# Patient Record
Sex: Female | Born: 1955
Health system: Southern US, Community
[De-identification: ages and names within clinical notes are randomized; demographics above are authoritative.]

## PROBLEM LIST (undated history)

## (undated) DIAGNOSIS — T4145XA Adverse effect of unspecified anesthetic, initial encounter: Secondary | ICD-10-CM

## (undated) DIAGNOSIS — T8859XA Other complications of anesthesia, initial encounter: Secondary | ICD-10-CM

## (undated) DIAGNOSIS — N920 Excessive and frequent menstruation with regular cycle: Secondary | ICD-10-CM

## (undated) DIAGNOSIS — Z972 Presence of dental prosthetic device (complete) (partial): Secondary | ICD-10-CM

## (undated) DIAGNOSIS — T7840XA Allergy, unspecified, initial encounter: Secondary | ICD-10-CM

## (undated) DIAGNOSIS — E119 Type 2 diabetes mellitus without complications: Secondary | ICD-10-CM

## (undated) DIAGNOSIS — G629 Polyneuropathy, unspecified: Secondary | ICD-10-CM

## (undated) DIAGNOSIS — J209 Acute bronchitis, unspecified: Secondary | ICD-10-CM

## (undated) DIAGNOSIS — R197 Diarrhea, unspecified: Secondary | ICD-10-CM

## (undated) DIAGNOSIS — I10 Essential (primary) hypertension: Secondary | ICD-10-CM

## (undated) DIAGNOSIS — M1990 Unspecified osteoarthritis, unspecified site: Secondary | ICD-10-CM

## (undated) DIAGNOSIS — K209 Esophagitis, unspecified without bleeding: Secondary | ICD-10-CM

## (undated) DIAGNOSIS — IMO0001 Reserved for inherently not codable concepts without codable children: Secondary | ICD-10-CM

## (undated) DIAGNOSIS — Z794 Long term (current) use of insulin: Secondary | ICD-10-CM

## (undated) DIAGNOSIS — E785 Hyperlipidemia, unspecified: Secondary | ICD-10-CM

## (undated) DIAGNOSIS — K219 Gastro-esophageal reflux disease without esophagitis: Secondary | ICD-10-CM

## (undated) HISTORY — DX: Esophagitis, unspecified without bleeding: K20.90

## (undated) HISTORY — DX: Excessive and frequent menstruation with regular cycle: N92.0

## (undated) HISTORY — DX: Long term (current) use of insulin: Z79.4

## (undated) HISTORY — DX: Acute bronchitis, unspecified: J20.9

## (undated) HISTORY — DX: Diarrhea, unspecified: R19.7

## (undated) HISTORY — DX: Essential (primary) hypertension: I10

## (undated) HISTORY — DX: Hyperlipidemia, unspecified: E78.5

## (undated) HISTORY — PX: CHOLECYSTECTOMY: SHX55

## (undated) HISTORY — DX: Type 2 diabetes mellitus without complications: E11.9

## (undated) HISTORY — PX: WRIST SURGERY: SHX841

## (undated) HISTORY — DX: Esophagitis, unspecified: K20.9

## (undated) HISTORY — DX: Allergy, unspecified, initial encounter: T78.40XA

---

## 2008-10-08 ENCOUNTER — Emergency Department: Payer: Self-pay | Admitting: Emergency Medicine

## 2010-11-02 ENCOUNTER — Emergency Department: Payer: Self-pay | Admitting: *Deleted

## 2014-09-16 DIAGNOSIS — N951 Menopausal and female climacteric states: Secondary | ICD-10-CM | POA: Insufficient documentation

## 2014-09-16 DIAGNOSIS — E119 Type 2 diabetes mellitus without complications: Secondary | ICD-10-CM | POA: Insufficient documentation

## 2014-09-16 DIAGNOSIS — Z794 Long term (current) use of insulin: Secondary | ICD-10-CM | POA: Insufficient documentation

## 2014-09-16 DIAGNOSIS — E1122 Type 2 diabetes mellitus with diabetic chronic kidney disease: Secondary | ICD-10-CM

## 2014-09-16 DIAGNOSIS — K209 Esophagitis, unspecified without bleeding: Secondary | ICD-10-CM | POA: Insufficient documentation

## 2014-09-16 DIAGNOSIS — F17200 Nicotine dependence, unspecified, uncomplicated: Secondary | ICD-10-CM

## 2014-09-16 DIAGNOSIS — E1129 Type 2 diabetes mellitus with other diabetic kidney complication: Secondary | ICD-10-CM | POA: Insufficient documentation

## 2014-09-16 DIAGNOSIS — E785 Hyperlipidemia, unspecified: Secondary | ICD-10-CM | POA: Insufficient documentation

## 2014-09-16 DIAGNOSIS — N181 Chronic kidney disease, stage 1: Secondary | ICD-10-CM

## 2014-09-16 DIAGNOSIS — E1169 Type 2 diabetes mellitus with other specified complication: Secondary | ICD-10-CM | POA: Insufficient documentation

## 2014-09-16 DIAGNOSIS — E669 Obesity, unspecified: Secondary | ICD-10-CM | POA: Insufficient documentation

## 2014-09-16 DIAGNOSIS — I129 Hypertensive chronic kidney disease with stage 1 through stage 4 chronic kidney disease, or unspecified chronic kidney disease: Secondary | ICD-10-CM

## 2014-09-16 DIAGNOSIS — G4700 Insomnia, unspecified: Secondary | ICD-10-CM | POA: Insufficient documentation

## 2014-09-16 DIAGNOSIS — J309 Allergic rhinitis, unspecified: Secondary | ICD-10-CM | POA: Insufficient documentation

## 2014-09-19 ENCOUNTER — Other Ambulatory Visit: Payer: Self-pay

## 2014-09-19 MED ORDER — ATORVASTATIN CALCIUM 20 MG PO TABS: 20.0000 mg | ORAL_TABLET | Freq: Every day | ORAL | Status: DC

## 2014-09-19 NOTE — Telephone Encounter (Signed)
Patient was last seen on 05/05/14, practice partner number is [id number], and pharmacy is [facility].

## 2014-09-23 ENCOUNTER — Ambulatory Visit (INDEPENDENT_AMBULATORY_CARE_PROVIDER_SITE_OTHER): Payer: Commercial Managed Care - PPO | Admitting: Unknown Physician Specialty

## 2014-09-23 ENCOUNTER — Encounter: Payer: Self-pay | Admitting: Unknown Physician Specialty

## 2014-09-23 ENCOUNTER — Other Ambulatory Visit: Payer: Self-pay | Admitting: Unknown Physician Specialty

## 2014-09-23 VITALS — BP 124/79 | HR 70 | Temp 97.6°F | Ht 64.5 in | Wt 219.0 lb

## 2014-09-23 DIAGNOSIS — N185 Chronic kidney disease, stage 5: Secondary | ICD-10-CM | POA: Diagnosis not present

## 2014-09-23 DIAGNOSIS — I129 Hypertensive chronic kidney disease with stage 1 through stage 4 chronic kidney disease, or unspecified chronic kidney disease: Secondary | ICD-10-CM

## 2014-09-23 DIAGNOSIS — E1122 Type 2 diabetes mellitus with diabetic chronic kidney disease: Secondary | ICD-10-CM | POA: Diagnosis not present

## 2014-09-23 DIAGNOSIS — N183 Chronic kidney disease, stage 3 (moderate): Secondary | ICD-10-CM | POA: Diagnosis not present

## 2014-09-23 DIAGNOSIS — N181 Chronic kidney disease, stage 1: Secondary | ICD-10-CM | POA: Diagnosis not present

## 2014-09-23 DIAGNOSIS — N184 Chronic kidney disease, stage 4 (severe): Secondary | ICD-10-CM | POA: Diagnosis not present

## 2014-09-23 DIAGNOSIS — M542 Cervicalgia: Secondary | ICD-10-CM

## 2014-09-23 DIAGNOSIS — N189 Chronic kidney disease, unspecified: Secondary | ICD-10-CM

## 2014-09-23 DIAGNOSIS — N182 Chronic kidney disease, stage 2 (mild): Secondary | ICD-10-CM | POA: Diagnosis not present

## 2014-09-23 LAB — BAYER DCA HB A1C WAIVED: HB A1C: 7.2 % — AB (ref <7.0)

## 2014-09-23 MED ORDER — INSULIN DETEMIR 100 UNIT/ML FLEXPEN: 60.0000 [IU] | PEN_INJECTOR | Freq: Every day | SUBCUTANEOUS | Status: DC

## 2014-09-23 NOTE — Addendum Note (Signed)
Addended by: Kathrine Haddock on: 09/23/2014 12:05 PM   Modules accepted: Orders, Medications

## 2014-09-23 NOTE — Assessment & Plan Note (Signed)
Hgb A1C is 7.2 and down from 7.8.  She has lost 22 pounds and planning on continuing.

## 2014-09-23 NOTE — Progress Notes (Signed)
BP 124/79 mmHg  Pulse 70  Temp(Src) 97.6 F (36.4 C)  Ht 5' 4.5" (1.638 m)  Wt 219 lb (99.338 kg)  BMI 37.02 kg/m2  SpO2 97%  LMP  (LMP Unknown)   Subjective:    [name], female    DOB: 05/12/1955, 59 y.o.   MRN: [id number]  HPI: April Johnson is a 59 y.o. female  Chief Complaint  Patient presents with  . Diabetes  . Hypertension  . Hyperlipidemia   1.  DIABETES Hypoglycemic episodes:  no  H8  Polydipsia/polyuria:  no  H8 Visual disturbance:  no  R2  Chest pain:  no  R4  Paresthesias:  no  R10  Glucose Monitoring:  yes   Accucheck frequency:  once daily  Last Hgb A1C is 7.8 Fasting glucose:  145 this AM Evening:  170-200 Long acting insulin: 60  units  qHS  Blood Pressure Monitoring:  not checking.  H3 Retinal Examination:  not up to date. Will schedule  P1 Foot Exam: Foot Exam Done on 01/07/14 P1 Diabetic Education:  completed   P1 Aspirin:  Aspirin Therapy Not applicable on 73/53/29  Back pain Pt with back pain that catches "that I can hardly move."  States that the pain radiates down her legs.  She is having neck stiffness and pain "I wonder if my bone is out of line."  No reall alleviating or aggravating factors.     Relevant past medical, surgical, family and social history reviewed and updated as indicated. Interim medical history since our last visit reviewed. Allergies and medications reviewed and updated.  Review of Systems  Per HPI unless specifically indicated above     Objective:    BP 124/79 mmHg  Pulse 70  Temp(Src) 97.6 F (36.4 C)  Ht 5' 4.5" (1.638 m)  Wt 219 lb (99.338 kg)  BMI 37.02 kg/m2  SpO2 97%  LMP  (LMP Unknown)  Wt Readings from Last 3 Encounters:  09/23/14 219 lb (99.338 kg)  05/05/14 223 lb (101.152 kg)    Physical Exam  Constitutional: She is oriented to person, place, and time. She appears well-developed and well-nourished. No distress.  HENT:  Head: Normocephalic and atraumatic.   Eyes: Conjunctivae and lids are normal. Right eye exhibits no discharge. Left eye exhibits no discharge. No scleral icterus.  Cardiovascular: Normal rate and regular rhythm.   Pulmonary/Chest: Effort normal and breath sounds normal. No respiratory distress.  Abdominal: Normal appearance. There is no splenomegaly or hepatomegaly.  Musculoskeletal: Normal range of motion.  Neurological: She is alert and oriented to person, place, and time.  Skin: Skin is intact. No rash noted. No pallor.  Psychiatric: She has a normal mood and affect. Her behavior is normal. Judgment and thought content normal.    No results found for this or any previous visit.    Assessment & Plan:   Problem List Items Addressed This Visit      Endocrine   Diabetes mellitus with renal manifestation    Hgb A1C is 7.2 and down from 7.8.  She has lost 22 pounds and planning on continuing.          Genitourinary   Hypertensive chronic kidney disease    Other Visit Diagnoses    Neck pain    -  Primary    Discussed PT vs chiropractor.  She will go to a chiropractor        Follow up plan: Return in about 3 months (around  12/24/2014).   Will need Hgb A1C, CMP, Uric Acid, Microalbumin.

## 2014-09-24 LAB — COMPREHENSIVE METABOLIC PANEL
A/G RATIO: 2.1 (ref 1.1–2.5)
ALBUMIN: 4.4 g/dL (ref 3.5–5.5)
ALT: 15 IU/L (ref 0–32)
AST: 16 IU/L (ref 0–40)
Alkaline Phosphatase: 81 IU/L (ref 39–117)
BUN/Creatinine Ratio: 21 (ref 9–23)
BUN: 17 mg/dL (ref 6–24)
Bilirubin Total: 0.5 mg/dL (ref 0.0–1.2)
CALCIUM: 9.8 mg/dL (ref 8.7–10.2)
CO2: 25 mmol/L (ref 18–29)
Chloride: 100 mmol/L (ref 97–108)
Creatinine, Ser: 0.8 mg/dL (ref 0.57–1.00)
GFR calc Af Amer: 95 mL/min/{1.73_m2} (ref >59)
GFR, EST NON AFRICAN AMERICAN: 82 mL/min/{1.73_m2} (ref >59)
GLUCOSE: 116 mg/dL — AB (ref 65–99)
Globulin, Total: 2.1 g/dL (ref 1.5–4.5)
Potassium: 4.4 mmol/L (ref 3.5–5.2)
SODIUM: 142 mmol/L (ref 134–144)
Total Protein: 6.5 g/dL (ref 6.0–8.5)

## 2014-09-29 ENCOUNTER — Other Ambulatory Visit: Payer: Self-pay

## 2014-09-29 MED ORDER — TELMISARTAN-HCTZ 80-25 MG PO TABS: 1.0000 | ORAL_TABLET | Freq: Every day | ORAL | Status: DC

## 2014-09-29 NOTE — Telephone Encounter (Signed)
Routing to provider. Patient was last seen on 09/23/14 and [facility] [place].

## 2014-10-08 ENCOUNTER — Other Ambulatory Visit: Payer: Self-pay

## 2014-10-08 MED ORDER — AMLODIPINE BESYLATE 5 MG PO TABS: 5.0000 mg | ORAL_TABLET | Freq: Every day | ORAL | Status: DC

## 2014-10-08 MED ORDER — PIOGLITAZONE HCL-METFORMIN HCL 15-500 MG PO TABS: 1.0000 | ORAL_TABLET | Freq: Two times a day (BID) | ORAL | Status: DC

## 2014-10-08 NOTE — Telephone Encounter (Signed)
Patient was last seen on 09/23/14 and [facility] [place].

## 2014-11-03 ENCOUNTER — Other Ambulatory Visit: Payer: Self-pay

## 2014-11-03 MED ORDER — CANAGLIFLOZIN 300 MG PO TABS: 300.0000 mg | ORAL_TABLET | Freq: Every day | ORAL | Status: DC

## 2014-11-03 NOTE — Telephone Encounter (Signed)
Patient was last seen on 09/23/14 and [facility] [place].  

## 2014-11-19 ENCOUNTER — Other Ambulatory Visit: Payer: Self-pay | Admitting: Unknown Physician Specialty

## 2014-12-05 ENCOUNTER — Other Ambulatory Visit: Payer: Self-pay | Admitting: Unknown Physician Specialty

## 2014-12-17 ENCOUNTER — Ambulatory Visit: Payer: Commercial Managed Care - PPO | Admitting: Unknown Physician Specialty

## 2014-12-19 ENCOUNTER — Ambulatory Visit: Payer: Commercial Managed Care - PPO | Admitting: Unknown Physician Specialty

## 2014-12-22 ENCOUNTER — Other Ambulatory Visit: Payer: Self-pay

## 2014-12-22 MED ORDER — METOPROLOL SUCCINATE ER 100 MG PO TB24: 100.0000 mg | ORAL_TABLET | Freq: Every day | ORAL | Status: DC

## 2014-12-22 NOTE — Telephone Encounter (Signed)
PATIENT: April Johnson DOB: Oct 27, 1955 LAST VISIT: 09/20/2014  Patient requests metoprolol er 100 mg tab.

## 2014-12-24 ENCOUNTER — Ambulatory Visit: Payer: Commercial Managed Care - PPO | Admitting: Unknown Physician Specialty

## 2014-12-24 ENCOUNTER — Ambulatory Visit (INDEPENDENT_AMBULATORY_CARE_PROVIDER_SITE_OTHER): Payer: Commercial Managed Care - PPO | Admitting: Unknown Physician Specialty

## 2014-12-24 ENCOUNTER — Encounter: Payer: Self-pay | Admitting: Unknown Physician Specialty

## 2014-12-24 ENCOUNTER — Other Ambulatory Visit: Payer: Self-pay

## 2014-12-24 VITALS — BP 129/77 | HR 64 | Temp 98.2°F | Ht 64.2 in | Wt 215.2 lb

## 2014-12-24 DIAGNOSIS — E1122 Type 2 diabetes mellitus with diabetic chronic kidney disease: Secondary | ICD-10-CM | POA: Diagnosis not present

## 2014-12-24 DIAGNOSIS — I129 Hypertensive chronic kidney disease with stage 1 through stage 4 chronic kidney disease, or unspecified chronic kidney disease: Secondary | ICD-10-CM

## 2014-12-24 DIAGNOSIS — N182 Chronic kidney disease, stage 2 (mild): Secondary | ICD-10-CM

## 2014-12-24 DIAGNOSIS — Z0000 Encounter for general adult medical examination without abnormal findings: Secondary | ICD-10-CM

## 2014-12-24 DIAGNOSIS — Z794 Long term (current) use of insulin: Secondary | ICD-10-CM | POA: Diagnosis not present

## 2014-12-24 DIAGNOSIS — G4700 Insomnia, unspecified: Secondary | ICD-10-CM | POA: Diagnosis not present

## 2014-12-24 DIAGNOSIS — Z23 Encounter for immunization: Secondary | ICD-10-CM

## 2014-12-24 DIAGNOSIS — E785 Hyperlipidemia, unspecified: Secondary | ICD-10-CM | POA: Diagnosis not present

## 2014-12-24 LAB — LIPID PANEL PICCOLO, WAIVED
CHOL/HDL RATIO PICCOLO,WAIVE: 3.1 mg/dL
CHOLESTEROL PICCOLO, WAIVED: 163 mg/dL (ref <200)
HDL Chol Piccolo, Waived: 52 mg/dL — ABNORMAL LOW (ref >59)
LDL Chol Calc Piccolo Waived: 43 mg/dL (ref <100)
TRIGLYCERIDES PICCOLO,WAIVED: 342 mg/dL — AB (ref <150)
VLDL Chol Calc Piccolo,Waive: 68 mg/dL — ABNORMAL HIGH (ref <30)

## 2014-12-24 LAB — MICROALBUMIN, URINE WAIVED
CREATININE, URINE WAIVED: 50 mg/dL (ref 10–300)
Microalb, Ur Waived: 10 mg/L (ref 0–19)

## 2014-12-24 LAB — BAYER DCA HB A1C WAIVED: HB A1C: 7.2 % — AB (ref <7.0)

## 2014-12-24 MED ORDER — CANAGLIFLOZIN 300 MG PO TABS: 300.0000 mg | ORAL_TABLET | Freq: Every day | ORAL | Status: DC

## 2014-12-24 NOTE — Patient Instructions (Signed)
Insomnia Insomnia is a sleep disorder that makes it difficult to fall asleep or to stay asleep. Insomnia can cause tiredness (fatigue), low energy, difficulty concentrating, mood swings, and poor performance at work or school.  There are three different ways to classify insomnia:  Difficulty falling asleep.  Difficulty staying asleep.  Waking up too early in the morning. Any type of insomnia can be long-term (chronic) or short-term (acute). Both are common. Short-term insomnia usually lasts for three months or less. Chronic insomnia occurs at least three times a week for longer than three months. CAUSES  Insomnia may be caused by another condition, situation, or substance, such as:  Anxiety.  Certain medicines.  Gastroesophageal reflux disease (GERD) or other gastrointestinal conditions.  Asthma or other breathing conditions.  Restless legs syndrome, sleep apnea, or other sleep disorders.  Chronic pain.  Menopause. This may include hot flashes.  Stroke.  Abuse of alcohol, tobacco, or illegal drugs.  Depression.  Caffeine.   Neurological disorders, such as Alzheimer disease.  An overactive thyroid (hyperthyroidism). The cause of insomnia may not be known. RISK FACTORS Risk factors for insomnia include:  Gender. Women are more commonly affected than men.  Age. Insomnia is more common as you get older.  Stress. This may involve your professional or personal life.  Income. Insomnia is more common in people with lower income.  Lack of exercise.   Irregular work schedule or night shifts.  Traveling between different time zones. SIGNS AND SYMPTOMS If you have insomnia, trouble falling asleep or trouble staying asleep is the main symptom. This may lead to other symptoms, such as:  Feeling fatigued.  Feeling nervous about going to sleep.  Not feeling rested in the morning.  Having trouble concentrating.  Feeling irritable, anxious, or depressed. TREATMENT   Treatment for insomnia depends on the cause. If your insomnia is caused by an underlying condition, treatment will focus on addressing the condition. Treatment may also include:   Medicines to help you sleep.  Counseling or therapy.  Lifestyle adjustments. HOME CARE INSTRUCTIONS   Take medicines only as directed by your health care provider.  Keep regular sleeping and waking hours. Avoid naps.  Keep a sleep diary to help you and your health care provider figure out what could be causing your insomnia. Include:   When you sleep.  When you wake up during the night.  How well you sleep.   How rested you feel the next day.  Any side effects of medicines you are taking.  What you eat and drink.   Make your bedroom a comfortable place where it is easy to fall asleep:  Put up shades or special blackout curtains to block light from outside.  Use a white noise machine to block noise.  Keep the temperature cool.   Exercise regularly as directed by your health care provider. Avoid exercising right before bedtime.  Use relaxation techniques to manage stress. Ask your health care provider to suggest some techniques that may work well for you. These may include:  Breathing exercises.  Routines to release muscle tension.  Visualizing peaceful scenes.  Cut back on alcohol, caffeinated beverages, and cigarettes, especially close to bedtime. These can disrupt your sleep.  Do not overeat or eat spicy foods right before bedtime. This can lead to digestive discomfort that can make it hard for you to sleep.  Limit screen use before bedtime. This includes:  Watching TV.  Using your smartphone, tablet, and computer.  Stick to a routine. This   can help you fall asleep faster. Try to do a quiet activity, brush your teeth, and go to bed at the same time each night.  Get out of bed if you are still awake after 15 minutes of trying to sleep. Keep the lights down, but try reading or  doing a quiet activity. When you feel sleepy, go back to bed.  Make sure that you drive carefully. Avoid driving if you feel very sleepy.  Keep all follow-up appointments as directed by your health care provider. This is important. SEEK MEDICAL CARE IF:   You are tired throughout the day or have trouble in your daily routine due to sleepiness.  You continue to have sleep problems or your sleep problems get worse. SEEK IMMEDIATE MEDICAL CARE IF:   You have serious thoughts about hurting yourself or someone else.   This information is not intended to replace advice given to you by your health care provider. Make sure you discuss any questions you have with your health care provider.   Document Released: 02/19/2000 Document Revised: 11/12/2014 Document Reviewed: 11/22/2013 Elsevier Interactive Patient Education 2016 [employer] Insomnia Insomnia is a sleep disorder that makes it difficult to fall asleep or to stay asleep. Insomnia can cause tiredness (fatigue), low energy, difficulty concentrating, mood swings, and poor performance at work or school.  There are three different ways to classify insomnia:  Difficulty falling asleep.  Difficulty staying asleep.  Waking up too early in the morning. Any type of insomnia can be long-term (chronic) or short-term (acute). Both are common. Short-term insomnia usually lasts for three months or less. Chronic insomnia occurs at least three times a week for longer than three months. CAUSES  Insomnia may be caused by another condition, situation, or substance, such as:  Anxiety.  Certain medicines.  Gastroesophageal reflux disease (GERD) or other gastrointestinal conditions.  Asthma or other breathing conditions.  Restless legs syndrome, sleep apnea, or other sleep disorders.  Chronic pain.  Menopause. This may include hot flashes.  Stroke.  Abuse of alcohol, tobacco, or illegal drugs.  Depression.  Caffeine.   Neurological  disorders, such as Alzheimer disease.  An overactive thyroid (hyperthyroidism). The cause of insomnia may not be known. RISK FACTORS Risk factors for insomnia include:  Gender. Women are more commonly affected than men.  Age. Insomnia is more common as you get older.  Stress. This may involve your professional or personal life.  Income. Insomnia is more common in people with lower income.  Lack of exercise.   Irregular work schedule or night shifts.  Traveling between different time zones. SIGNS AND SYMPTOMS If you have insomnia, trouble falling asleep or trouble staying asleep is the main symptom. This may lead to other symptoms, such as:  Feeling fatigued.  Feeling nervous about going to sleep.  Not feeling rested in the morning.  Having trouble concentrating.  Feeling irritable, anxious, or depressed. TREATMENT  Treatment for insomnia depends on the cause. If your insomnia is caused by an underlying condition, treatment will focus on addressing the condition. Treatment may also include:   Medicines to help you sleep.  Counseling or therapy.  Lifestyle adjustments. HOME CARE INSTRUCTIONS   Take medicines only as directed by your health care provider.  Keep regular sleeping and waking hours. Avoid naps.  Keep a sleep diary to help you and your health care provider figure out what could be causing your insomnia. Include:   When you sleep.  When you wake up during the night.  How well you sleep.   How rested you feel the next day.  Any side effects of medicines you are taking.  What you eat and drink.   Make your bedroom a comfortable place where it is easy to fall asleep:  Put up shades or special blackout curtains to block light from outside.  Use a white noise machine to block noise.  Keep the temperature cool.   Exercise regularly as directed by your health care provider. Avoid exercising right before bedtime.  Use relaxation techniques to  manage stress. Ask your health care provider to suggest some techniques that may work well for you. These may include:  Breathing exercises.  Routines to release muscle tension.  Visualizing peaceful scenes.  Cut back on alcohol, caffeinated beverages, and cigarettes, especially close to bedtime. These can disrupt your sleep.  Do not overeat or eat spicy foods right before bedtime. This can lead to digestive discomfort that can make it hard for you to sleep.  Limit screen use before bedtime. This includes:  Watching TV.  Using your smartphone, tablet, and computer.  Stick to a routine. This can help you fall asleep faster. Try to do a quiet activity, brush your teeth, and go to bed at the same time each night.  Get out of bed if you are still awake after 15 minutes of trying to sleep. Keep the lights down, but try reading or doing a quiet activity. When you feel sleepy, go back to bed.  Make sure that you drive carefully. Avoid driving if you feel very sleepy.  Keep all follow-up appointments as directed by your health care provider. This is important. SEEK MEDICAL CARE IF:   You are tired throughout the day or have trouble in your daily routine due to sleepiness.  You continue to have sleep problems or your sleep problems get worse. SEEK IMMEDIATE MEDICAL CARE IF:   You have serious thoughts about hurting yourself or someone else.   This information is not intended to replace advice given to you by your health care provider. Make sure you discuss any questions you have with your health care provider.   Document Released: 02/19/2000 Document Revised: 11/12/2014 Document Reviewed: 11/22/2013 Elsevier Interactive Patient Education [employer].

## 2014-12-24 NOTE — Assessment & Plan Note (Addendum)
HgB A1C reviewed results 7.2 she has lost 35 lbs and plans to continue improving diet and continue exercising Uric acid ordered results pending Stable, continue present medications.

## 2014-12-24 NOTE — Assessment & Plan Note (Addendum)
Lipid panel results reviewed LDL 43 Total Cholesterol 163 results stable

## 2014-12-24 NOTE — Progress Notes (Signed)
BP 129/77 mmHg  Pulse 64  Temp(Src) 98.2 F (36.8 C)  Ht 5' 4.2" (1.631 m)  Wt 215 lb 3.2 oz (97.614 kg)  BMI 36.69 kg/m2  SpO2 98%  LMP  (LMP Unknown)   Subjective:    [name], female    DOB: 1955/08/01, 58 y.o.   MRN: [id number]  HPI: April Johnson is a 59 y.o. female  Chief Complaint  Patient presents with  . Diabetes  . Hyperlipidemia  . Hypertension   Diabetes This is a chronic problem medication compliance is excellent.   She is satisfied with the current treatment.  She checks blood sugars 1-2 times a week fasting blood sugars 105 to 130 postprandial blood sugars 220.  She has been limiting carbohydrate intake she has lost 35 lbs and feels great.  Intermittent numbness and tingling at night she thinks it may be positional.  Last Hgb A1C was 7.2.  Pertinent negatives denies hypoglycemic events, polydipsia/polyuria, visual disturbances, chest pain, or shortness of breath  Hypertension/Hyperlipidemia This is a chronic problem medication compliance is excellent.  She is satisfied with the current treatment.  Pertinent negatives denies chest pain, palpitations, edema, shortness of breath, or visual disturbances   Insomnia She has problems with falling and staying asleep she goes to bed at 08:30 pm she wakes up at 05:30 am.  On average she sleeps 5 hrs at night she wakes up at 01:00 am, and it takes 1 hour for her to fall back to sleep.  Pertinent negatives denies agitation, confusion, decreased concentration, or nervousness or anxiety  Relevant past medical, surgical, family and social history reviewed and updated as indicated. Interim medical history since our last visit reviewed. Allergies and medications reviewed and updated.  Review of Systems  Constitutional: Negative.   HENT: Negative.   Respiratory: Negative.   Cardiovascular: Negative.   Gastrointestinal: Positive for nausea and diarrhea. Negative for vomiting, abdominal pain,  constipation, blood in stool, abdominal distention and rectal pain.  Endocrine: Negative.   Genitourinary: Negative.   Musculoskeletal: Negative.   Skin: Negative.   Allergic/Immunologic: Negative.   Neurological: Positive for dizziness. Negative for tremors, seizures, syncope, facial asymmetry, speech difficulty, light-headedness, numbness and headaches.  Psychiatric/Behavioral: Negative.        Depressed at times due to husband working out of town a lot    Per HPI unless specifically indicated above     Objective:    BP 129/77 mmHg  Pulse 64  Temp(Src) 98.2 F (36.8 C)  Ht 5' 4.2" (1.631 m)  Wt 215 lb 3.2 oz (97.614 kg)  BMI 36.69 kg/m2  SpO2 98%  LMP  (LMP Unknown)  Wt Readings from Last 3 Encounters:  12/24/14 215 lb 3.2 oz (97.614 kg)  09/23/14 219 lb (99.338 kg)  05/05/14 223 lb (101.152 kg)    Physical Exam  Constitutional: She is oriented to person, place, and time. She appears well-developed and well-nourished. No distress.  HENT:  Head: Normocephalic and atraumatic.  Right Ear: External ear normal.  Left Ear: External ear normal.  Nose: Nose normal.  Neck: Normal range of motion. Neck supple.  Cardiovascular: Normal rate, regular rhythm, normal heart sounds and intact distal pulses.   Pulmonary/Chest: Effort normal and breath sounds normal. No respiratory distress. She has no wheezes. She has no rales. She exhibits no tenderness.  Musculoskeletal: Normal range of motion. She exhibits no edema or tenderness.  Neurological: She is alert and oriented to person, place, and time.  Skin: Skin is warm and dry. No rash noted. She is not diaphoretic. No erythema. No pallor.  Psychiatric: She has a normal mood and affect. Her behavior is normal. Judgment and thought content normal.        Assessment & Plan:   Problem List Items Addressed This Visit      Unprioritized   Diabetes mellitus with renal manifestation ([place])    HgB A1C reviewed results 7.2 she has lost  35 lbs and plans to continue improving diet and continue exercising Uric acid ordered results pending Stable, continue present medications.        Relevant Orders   Bayer DCA Hb A1c Waived   Microalbumin, Urine Waived   Hyperlipidemia    Lipid panel results reviewed LDL 43 Total Cholesterol 163 results stable      Relevant Orders   Lipid Panel Piccolo, Waived   Hypertensive chronic kidney disease    Comprehensive metabolic panel ordered results pending Stable, continue present medications.        Relevant Orders   Microalbumin, Urine Waived   Uric acid   Comprehensive metabolic panel   Insomnia    Instructed pt during times she is awake during the night she can read a book or magazine until she falls asleep Sleep hygiene education handout given to pt Instructed pt to not lay in bed when she is awake      Routine health maintenance   Relevant Orders   Ambulatory referral to Gastroenterology   MM DIGITAL SCREENING BILATERAL    Other Visit Diagnoses    Immunization due    -  Primary    Relevant Orders    Flu Vaccine QUAD 36+ mos PF IM (Fluarix & Fluzone Quad PF) (Completed)        Follow up plan: Return in about 3 months (around 03/26/2015) for CPE.

## 2014-12-24 NOTE — Assessment & Plan Note (Signed)
Comprehensive metabolic panel ordered results pending Stable, continue present medications.

## 2014-12-24 NOTE — Assessment & Plan Note (Addendum)
Instructed pt during times she is awake during the night she can read a book or magazine until she falls asleep Sleep hygiene education handout given to pt Instructed pt to not lay in bed when she is awake

## 2014-12-24 NOTE — Telephone Encounter (Signed)
Patient was just seen this morning and [facility] [place].

## 2014-12-25 LAB — COMPREHENSIVE METABOLIC PANEL
A/G RATIO: 2 (ref 1.1–2.5)
ALT: 17 IU/L (ref 0–32)
AST: 19 IU/L (ref 0–40)
Albumin: 4.5 g/dL (ref 3.5–5.5)
Alkaline Phosphatase: 84 IU/L (ref 39–117)
BILIRUBIN TOTAL: 0.4 mg/dL (ref 0.0–1.2)
BUN/Creatinine Ratio: 18 (ref 9–23)
BUN: 14 mg/dL (ref 6–24)
CALCIUM: 10 mg/dL (ref 8.7–10.2)
CHLORIDE: 98 mmol/L (ref 97–106)
CO2: 28 mmol/L (ref 18–29)
Creatinine, Ser: 0.78 mg/dL (ref 0.57–1.00)
GFR, EST AFRICAN AMERICAN: 97 mL/min/{1.73_m2} (ref >59)
GFR, EST NON AFRICAN AMERICAN: 84 mL/min/{1.73_m2} (ref >59)
GLOBULIN, TOTAL: 2.2 g/dL (ref 1.5–4.5)
Glucose: 95 mg/dL (ref 65–99)
POTASSIUM: 4.7 mmol/L (ref 3.5–5.2)
SODIUM: 139 mmol/L (ref 136–144)
TOTAL PROTEIN: 6.7 g/dL (ref 6.0–8.5)

## 2014-12-25 LAB — URIC ACID: Uric Acid: 5.4 mg/dL (ref 2.5–7.1)

## 2015-01-02 ENCOUNTER — Other Ambulatory Visit: Payer: Self-pay

## 2015-01-02 ENCOUNTER — Telehealth: Payer: Self-pay

## 2015-01-02 NOTE — Telephone Encounter (Signed)
Gastroenterology Pre-Procedure Review  Request Date: 01/12/15 Requesting Physician: Kathrine Haddock, NP  PATIENT REVIEW QUESTIONS: The patient responded to the following health history questions as indicated:    1. Are you having any GI issues? yes (Rectal bleeding, soreness) 2. Do you have a personal history of Polyps? no 3. Do you have a family history of Colon Cancer or Polyps? no 4. Diabetes Mellitus? yes (Type 2) 5. Joint replacements in the past 12 months?no 6. Major health problems in the past 3 months?no 7. Any artificial heart valves, MVP, or defibrillator?no    MEDICATIONS & ALLERGIES:    Patient reports the following regarding taking any anticoagulation/antiplatelet therapy:   Plavix, Coumadin, Eliquis, Xarelto, Lovenox, Pradaxa, Brilinta, or Effient? no Aspirin? yes (ASA 6m)  Patient confirms/reports the following medications:  Current Outpatient Prescriptions  Medication Sig Dispense Refill  . amLODipine (NORVASC) 5 MG tablet Take 1 tablet (5 mg total) by mouth daily. 90 tablet 1  . aspirin 81 MG tablet Take 81 mg by mouth daily.    .Marland Kitchenatorvastatin (LIPITOR) 20 MG tablet Take 1 tablet (20 mg total) by mouth daily. 30 tablet 3  . canagliflozin (INVOKANA) 300 MG TABS tablet Take 300 mg by mouth daily. 30 tablet 0  . fluconazole (DIFLUCAN) 150 MG tablet 1 TABLET NOW ORAL FOR 1 DAYS 1 tablet 4  . glucose blood test strip 1 each by Other route as needed for other. Use as instructed    . Insulin Detemir (LEVEMIR FLEXTOUCH) 100 UNIT/ML Pen Inject 60 Units into the skin at bedtime. 20 mL 12  . metoprolol succinate (TOPROL-XL) 100 MG 24 hr tablet Take 1 tablet (100 mg total) by mouth daily. Take with or immediately following a meal. 90 tablet 1  . omeprazole (PRILOSEC) 20 MG capsule Take 20 mg by mouth daily.    . pioglitazone-metformin (ACTOPLUS MET) 15-500 MG per tablet Take 1 tablet by mouth 2 (two) times daily with a meal. 180 tablet 3  . telmisartan-hydrochlorothiazide  (MICARDIS HCT) 80-25 MG per tablet Take 1 tablet by mouth daily. 90 tablet 3   No current facility-administered medications for this visit.    Patient confirms/reports the following allergies:  Allergies  Allergen Reactions  . Niacin And Related     No orders of the defined types were placed in this encounter.    AUTHORIZATION INFORMATION Primary Insurance: 1D#: Group #:  Secondary Insurance: 1D#: Group #:  SCHEDULE INFORMATION: Date: 01/12/15 Time: [place]

## 2015-01-09 NOTE — Discharge Instructions (Signed)

## 2015-01-12 ENCOUNTER — Ambulatory Visit: Payer: Commercial Managed Care - PPO | Admitting: Anesthesiology

## 2015-01-12 ENCOUNTER — Encounter
Admission: RE | Disposition: A | Discharge status: Home / Self Care | Payer: Self-pay | Source: Ambulatory Visit | Attending: Gastroenterology

## 2015-01-12 ENCOUNTER — Other Ambulatory Visit: Payer: Self-pay | Admitting: Gastroenterology

## 2015-01-12 ENCOUNTER — Ambulatory Visit
Admission: RE | Admit: 2015-01-12 | Discharge: 2015-01-12 | Disposition: A | Discharge status: Home / Self Care | Payer: Commercial Managed Care - PPO | Source: Ambulatory Visit | Attending: Gastroenterology | Admitting: Gastroenterology

## 2015-01-12 DIAGNOSIS — E785 Hyperlipidemia, unspecified: Secondary | ICD-10-CM | POA: Diagnosis not present

## 2015-01-12 DIAGNOSIS — K219 Gastro-esophageal reflux disease without esophagitis: Secondary | ICD-10-CM | POA: Diagnosis not present

## 2015-01-12 DIAGNOSIS — Z87891 Personal history of nicotine dependence: Secondary | ICD-10-CM | POA: Diagnosis not present

## 2015-01-12 DIAGNOSIS — K635 Polyp of colon: Secondary | ICD-10-CM | POA: Diagnosis not present

## 2015-01-12 DIAGNOSIS — K5730 Diverticulosis of large intestine without perforation or abscess without bleeding: Secondary | ICD-10-CM | POA: Diagnosis not present

## 2015-01-12 DIAGNOSIS — Z7982 Long term (current) use of aspirin: Secondary | ICD-10-CM | POA: Insufficient documentation

## 2015-01-12 DIAGNOSIS — E1140 Type 2 diabetes mellitus with diabetic neuropathy, unspecified: Secondary | ICD-10-CM | POA: Diagnosis not present

## 2015-01-12 DIAGNOSIS — D120 Benign neoplasm of cecum: Secondary | ICD-10-CM | POA: Diagnosis not present

## 2015-01-12 DIAGNOSIS — K641 Second degree hemorrhoids: Secondary | ICD-10-CM | POA: Diagnosis not present

## 2015-01-12 DIAGNOSIS — Z888 Allergy status to other drugs, medicaments and biological substances status: Secondary | ICD-10-CM | POA: Insufficient documentation

## 2015-01-12 DIAGNOSIS — Z794 Long term (current) use of insulin: Secondary | ICD-10-CM | POA: Insufficient documentation

## 2015-01-12 DIAGNOSIS — D122 Benign neoplasm of ascending colon: Secondary | ICD-10-CM | POA: Insufficient documentation

## 2015-01-12 DIAGNOSIS — I10 Essential (primary) hypertension: Secondary | ICD-10-CM | POA: Insufficient documentation

## 2015-01-12 DIAGNOSIS — Z79899 Other long term (current) drug therapy: Secondary | ICD-10-CM | POA: Diagnosis not present

## 2015-01-12 DIAGNOSIS — D125 Benign neoplasm of sigmoid colon: Secondary | ICD-10-CM | POA: Diagnosis not present

## 2015-01-12 DIAGNOSIS — K921 Melena: Secondary | ICD-10-CM | POA: Insufficient documentation

## 2015-01-12 HISTORY — DX: Other complications of anesthesia, initial encounter: T88.59XA

## 2015-01-12 HISTORY — DX: Gastro-esophageal reflux disease without esophagitis: K21.9

## 2015-01-12 HISTORY — DX: Reserved for inherently not codable concepts without codable children: IMO0001

## 2015-01-12 HISTORY — DX: Polyneuropathy, unspecified: G62.9

## 2015-01-12 HISTORY — PX: COLONOSCOPY WITH PROPOFOL: SHX5780

## 2015-01-12 HISTORY — DX: Adverse effect of unspecified anesthetic, initial encounter: T41.45XA

## 2015-01-12 HISTORY — PX: POLYPECTOMY: SHX5525

## 2015-01-12 HISTORY — DX: Presence of dental prosthetic device (complete) (partial): Z97.2

## 2015-01-12 HISTORY — DX: Unspecified osteoarthritis, unspecified site: M19.90

## 2015-01-12 LAB — GLUCOSE, CAPILLARY
GLUCOSE-CAPILLARY: 127 mg/dL — AB (ref 65–99)
Glucose-Capillary: 140 mg/dL — ABNORMAL HIGH (ref 65–99)

## 2015-01-12 SURGERY — COLONOSCOPY WITH PROPOFOL
Anesthesia: Monitor Anesthesia Care | Wound class: Contaminated

## 2015-01-12 MED ORDER — LACTATED RINGERS IV SOLN
INTRAVENOUS | Status: DC
  Administered 2015-01-12: 08:00:00 via INTRAVENOUS

## 2015-01-12 MED ORDER — PROPOFOL 10 MG/ML IV BOLUS
INTRAVENOUS | Status: DC | PRN
  Administered 2015-01-12: 100 mg via INTRAVENOUS
  Administered 2015-01-12 (×2): 20 mg via INTRAVENOUS
  Administered 2015-01-12: 50 mg via INTRAVENOUS
  Administered 2015-01-12: 30 mg via INTRAVENOUS
  Administered 2015-01-12 (×2): 40 mg via INTRAVENOUS

## 2015-01-12 MED ORDER — LIDOCAINE HCL (CARDIAC) 20 MG/ML IV SOLN
INTRAVENOUS | Status: DC | PRN
  Administered 2015-01-12: 50 mg via INTRAVENOUS

## 2015-01-12 MED ORDER — STERILE WATER FOR IRRIGATION IR SOLN
Status: DC | PRN
  Administered 2015-01-12: 09:00:00

## 2015-01-12 SURGICAL SUPPLY — 28 items
CANISTER SUCT 1200ML W/VALVE (MISCELLANEOUS) ×4 IMPLANT
FCP ESCP3.2XJMB 240X2.8X (MISCELLANEOUS)
FORCEPS BIOP RAD 4 LRG CAP 4 (CUTTING FORCEPS) ×4 IMPLANT
FORCEPS BIOP RJ4 240 W/NDL (MISCELLANEOUS)
FORCEPS ESCP3.2XJMB 240X2.8X (MISCELLANEOUS) IMPLANT
GOWN CVR UNV OPN BCK APRN NK (MISCELLANEOUS) ×4 IMPLANT
GOWN ISOL THUMB LOOP REG UNIV (MISCELLANEOUS) ×4
HEMOCLIP INSTINCT (CLIP) IMPLANT
INJECTOR VARIJECT VIN23 (MISCELLANEOUS) IMPLANT
KIT CO2 TUBING (TUBING) IMPLANT
KIT DEFENDO VALVE AND CONN (KITS) IMPLANT
KIT ENDO PROCEDURE OLY (KITS) ×4 IMPLANT
LIGATOR MULTIBAND 6SHOOTER MBL (MISCELLANEOUS) IMPLANT
MARKER SPOT ENDO TATTOO 5ML (MISCELLANEOUS) IMPLANT
PAD GROUND ADULT SPLIT (MISCELLANEOUS) IMPLANT
SNARE SHORT THROW 13M SML OVAL (MISCELLANEOUS) ×4 IMPLANT
SNARE SHORT THROW 30M LRG OVAL (MISCELLANEOUS) IMPLANT
SPOT EX ENDOSCOPIC TATTOO (MISCELLANEOUS)
SUCTION POLY TRAP 4CHAMBER (MISCELLANEOUS) IMPLANT
TRAP SUCTION POLY (MISCELLANEOUS) ×4 IMPLANT
TUBING CONN 6MMX3.1M (TUBING)
TUBING SUCTION CONN 0.25 STRL (TUBING) IMPLANT
UNDERPAD 30X60 958B10 (PK) (MISCELLANEOUS) IMPLANT
VALVE BIOPSY ENDO (VALVE) IMPLANT
VARIJECT INJECTOR VIN23 (MISCELLANEOUS)
WATER AUXILLARY (MISCELLANEOUS) IMPLANT
WATER STERILE IRR 250ML POUR (IV SOLUTION) ×4 IMPLANT
WATER STERILE IRR 500ML POUR (IV SOLUTION) IMPLANT

## 2015-01-12 NOTE — Op Note (Signed)
[facility] Gastroenterology Patient Name: April Johnson Procedure Date: 01/12/2015 8:45 AM MRN: [id number] Account #: [id number] Date of Birth: [birth year] Admit Type: Outpatient Age: 59 Room: [facility] OR ROOM 01 Gender: Female Note Status: Finalized Procedure:         Colonoscopy Indications:       Hematochezia Providers:         Lucilla Lame, MD Referring MD:      Kathrine Haddock, PA (Referring MD) Medicines:         Propofol per Anesthesia Complications:     No immediate complications. Procedure:         Pre-Anesthesia Assessment:                    - Prior to the procedure, a History and Physical was                     performed, and patient medications and allergies were                     reviewed. The patient's tolerance of previous anesthesia                     was also reviewed. The risks and benefits of the procedure                     and the sedation options and risks were discussed with the                     patient. All questions were answered, and informed consent                     was obtained. Prior Anticoagulants: The patient has taken                     no previous anticoagulant or antiplatelet agents. ASA                     Grade Assessment: II - A patient with mild systemic                     disease. After reviewing the risks and benefits, the                     patient was deemed in satisfactory condition to undergo                     the procedure.                    After obtaining informed consent, the colonoscope was                     passed under direct vision. Throughout the procedure, the                     patient's blood pressure, pulse, and oxygen saturations                     were monitored continuously. The Olympus CF-HQ190L                     Colonoscope (S#. [id number]) was introduced through the anus                     and  advanced to the the cecum, identified by appendiceal                     orifice and  ileocecal valve. The colonoscopy was performed                     without difficulty. The patient tolerated the procedure                     well. The quality of the bowel preparation was excellent. Findings:      The perianal and digital rectal examinations were normal.      A 4 mm polyp was found in the ascending colon. The polyp was sessile.       The polyp was removed with a cold snare. Resection and retrieval were       complete.      A 3 mm polyp was found in the cecum. The polyp was sessile. The polyp       was removed with a cold biopsy forceps. Resection and retrieval were       complete.      A 4 mm polyp was found in the sigmoid colon. The polyp was sessile. The       polyp was removed with a cold biopsy forceps. Resection and retrieval       were complete.      Multiple small-mouthed diverticula were found in the sigmoid colon.      Non-bleeding internal hemorrhoids were found during retroflexion. The       hemorrhoids were Grade II (internal hemorrhoids that prolapse but reduce       spontaneously). Impression:        - One 4 mm polyp in the ascending colon. Resected and                     retrieved.                    - One 3 mm polyp in the cecum. Resected and retrieved.                    - One 4 mm polyp in the sigmoid colon. Resected and                     retrieved.                    - Diverticulosis in the sigmoid colon.                    - Non-bleeding internal hemorrhoids. Recommendation:    - Repeat colonoscopy in 5 years if polyp adenoma and 10                     years if hyperplastic Procedure Code(s): --- Professional ---                    [phone], Colonoscopy, flexible; with removal of tumor(s),                     polyp(s), or other lesion(s) by snare technique                    06237, 2, Colonoscopy, flexible; with biopsy, single or  multiple Diagnosis Code(s): --- Professional ---                    K92.1, Melena                     D12.2, Benign neoplasm of ascending colon                    D12.0, Benign neoplasm of cecum                    D12.5, Benign neoplasm of sigmoid colon CPT copyright 2014 American Medical Association. All rights reserved. The codes documented in this report are preliminary and upon coder review may  be revised to meet current compliance requirements. Lucilla Lame, MD 01/12/2015 9:09:36 AM This report has been signed electronically. Number of Addenda: 0 Note Initiated On: 01/12/2015 8:45 AM Scope Withdrawal Time: 0 hours 7 minutes 5 seconds  Total Procedure Duration: 0 hours 8 minutes 30 seconds       [facility]

## 2015-01-12 NOTE — Anesthesia Postprocedure Evaluation (Signed)
  Anesthesia Post-op Note  Patient: April Johnson  Procedure(s) Performed: Procedure(s) with comments: COLONOSCOPY WITH PROPOFOL (N/A) - DIABETIC-INSULIN DEPENDENT POLYPECTOMY  Anesthesia type:MAC  Patient location: PACU  Post pain: Pain level controlled  Post assessment: Post-op Vital signs reviewed, Patient's Cardiovascular Status Stable, Respiratory Function Stable, Patent Airway and No signs of Nausea or vomiting  Post vital signs: Reviewed and stable  Last Vitals:  Filed Vitals:   01/12/15 0907  BP:   Pulse: 79  Temp: 36.4 C  Resp: 17    Level of consciousness: awake, alert  and patient cooperative  Complications: No apparent anesthesia complications

## 2015-01-12 NOTE — H&P (Signed)
[facility]  [address] [place], [place] [address] Phone: [phone] Fax : [phone]  Primary Care Physician:  Kathrine Haddock, NP Primary Gastroenterologist:  Dr. Allen Norris  Pre-Procedure History & Physical: HPI:  April Johnson is a 59 y.o. female is here for an colonoscopy.   Past Medical History  Diagnosis Date  . Allergy   . Hyperlipidemia   . Excessive menstruation   . Long term current use of insulin ([place])   . Esophagitis   . Diarrhea   . Acute bronchitis   . Arthritis   . Shortness of breath dyspnea     upon exertion  . Diabetes mellitus without complication ([place])     type 2  . GERD (gastroesophageal reflux disease)   . Complication of anesthesia     difficult to wake up  . Wears partial dentures     upper / does not wear  . Neuropathy ([facility])     left leg and feet  . Hypertension     CONTROLLED ON MEDS    Past Surgical History  Procedure Laterality Date  . Cholecystectomy    . Wrist surgery Right     ganglion cyst    Prior to Admission medications   Medication Sig Start Date End Date Taking? Authorizing Provider  amLODipine (NORVASC) 5 MG tablet Take 1 tablet (5 mg total) by mouth daily. 10/08/14  Yes Kathrine Haddock, NP  aspirin 81 MG tablet Take 81 mg by mouth daily. am   Yes Historical Provider, MD  canagliflozin (INVOKANA) 300 MG TABS tablet Take 300 mg by mouth daily. Patient taking differently: Take 300 mg by mouth daily.  12/24/14  Yes Kathrine Haddock, NP  fluconazole (DIFLUCAN) 150 MG tablet 1 TABLET NOW ORAL FOR 1 DAYS 12/05/14  Yes Kathrine Haddock, NP  glucose blood test strip 1 each by Other route as needed for other. Use as instructed   Yes Historical Provider, MD  Insulin Detemir (LEVEMIR FLEXTOUCH) 100 UNIT/ML Pen Inject 60 Units into the skin at bedtime. Patient taking differently: Inject 60 Units into the skin at bedtime.  09/23/14  Yes Kathrine Haddock, NP  metoprolol succinate (TOPROL-XL) 100 MG 24 hr tablet Take 1 tablet  (100 mg total) by mouth daily. Take with or immediately following a meal. 12/22/14  Yes Kathrine Haddock, NP  Multiple Vitamin (MULTIVITAMIN) capsule Take 1 capsule by mouth daily.   Yes Historical Provider, MD  omeprazole (PRILOSEC) 20 MG capsule Take 20 mg by mouth daily.   Yes Historical Provider, MD  pioglitazone-metformin (ACTOPLUS MET) 15-500 MG per tablet Take 1 tablet by mouth 2 (two) times daily with a meal. 10/08/14  Yes Kathrine Haddock, NP  telmisartan-hydrochlorothiazide (MICARDIS HCT) 80-25 MG per tablet Take 1 tablet by mouth daily. 09/29/14  Yes Kathrine Haddock, NP  atorvastatin (LIPITOR) 20 MG tablet Take 1 tablet (20 mg total) by mouth daily. Patient not taking: Reported on 01/07/2015 09/19/14   Kathrine Haddock, NP    Allergies as of 01/02/2015 - Review Complete 12/24/2014  Allergen Reaction Noted  . Niacin and related  09/16/2014    Family History  Problem Relation Age of Onset  . Diabetes Sister   . Diabetes Brother   . Stroke Paternal Grandmother   . Diabetes Sister   . Diabetes Sister     Social History   Social History  . Marital Status: Married    Spouse Name: N/A  . Number of Children: N/A  . Years of Education: N/A  Occupational History  . Not on file.   Social History Main Topics  . Smoking status: Former Smoker -- 0.25 packs/day for 10 years    Types: Cigarettes    Quit date: 03/08/1999  . Smokeless tobacco: Never Used  . Alcohol Use: No  . Drug Use: No  . Sexual Activity: No   Other Topics Concern  . Not on file   Social History Narrative    Review of Systems: See HPI, otherwise negative ROS  Physical Exam: BP 117/70 mmHg  Pulse 73  Temp(Src) 97.9 F (36.6 C) (Temporal)  Resp 16  Ht 5' 4.2" (1.631 m)  Wt 214 lb (97.07 kg)  BMI 36.49 kg/m2  SpO2 100%  LMP  (LMP Unknown) General:   Alert,  pleasant and cooperative in NAD Head:  Normocephalic and atraumatic. Neck:  Supple; no masses or thyromegaly. Lungs:  Clear throughout to auscultation.     Heart:  Regular rate and rhythm. Abdomen:  Soft, nontender and nondistended. Normal bowel sounds, without guarding, and without rebound.   Neurologic:  Alert and  oriented x4;  grossly normal neurologically.  Impression/Plan: [name] is here for an colonoscopy to be performed for hematochezia  Risks, benefits, limitations, and alternatives regarding  colonoscopy have been reviewed with the patient.  Questions have been answered.  All parties agreeable.   Ollen Bowl, MD  01/12/2015, 8:17 AM

## 2015-01-12 NOTE — Anesthesia Preprocedure Evaluation (Addendum)
Anesthesia Evaluation  Patient identified by MRN, date of birth, ID band Patient awake    Airway Mallampati: II  TM Distance: >3 FB Neck ROM: Full    Dental  (+) Poor Dentition   Pulmonary former smoker,           Cardiovascular hypertension, Pt. on medications      Neuro/Psych    GI/Hepatic GERD  Medicated,  Endo/Other  diabetes, Well Controlled, Type 2  Renal/GU Renal InsufficiencyRenal disease     Musculoskeletal   Abdominal   Peds  Hematology   Anesthesia Other Findings   Reproductive/Obstetrics                            Anesthesia Physical Anesthesia Plan  ASA: III  Anesthesia Plan: MAC   Post-op Pain Management:    Induction: Intravenous  Airway Management Planned:   Additional Equipment:   Intra-op Plan:   Post-operative Plan:   Informed Consent: I have reviewed the patients History and Physical, chart, labs and discussed the procedure including the risks, benefits and alternatives for the proposed anesthesia with the patient or authorized representative who has indicated his/her understanding and acceptance.     Plan Discussed with: CRNA  Anesthesia Plan Comments:         Anesthesia Quick Evaluation

## 2015-01-12 NOTE — Anesthesia Procedure Notes (Signed)
Procedure Name: MAC Performed by: Levander Katzenstein Pre-anesthesia Checklist: Patient identified, Emergency Drugs available, Suction available, Timeout performed and Patient being monitored Patient Re-evaluated:Patient Re-evaluated prior to inductionOxygen Delivery Method: Nasal cannula Placement Confirmation: positive ETCO2     

## 2015-01-12 NOTE — Transfer of Care (Signed)
Immediate Anesthesia Transfer of Care Note  Patient: April Johnson  Procedure(s) Performed: Procedure(s) with comments: COLONOSCOPY WITH PROPOFOL (N/A) - DIABETIC-INSULIN DEPENDENT POLYPECTOMY  Patient Location: PACU  Anesthesia Type: MAC  Level of Consciousness: awake, alert  and patient cooperative  Airway and Oxygen Therapy: Patient Spontanous Breathing and Patient connected to supplemental oxygen  Post-op Assessment: Post-op Vital signs reviewed, Patient's Cardiovascular Status Stable, Respiratory Function Stable, Patent Airway and No signs of Nausea or vomiting  Post-op Vital Signs: Reviewed and stable  Complications: No apparent anesthesia complications

## 2015-01-13 ENCOUNTER — Encounter: Payer: Self-pay | Admitting: Gastroenterology

## 2015-01-21 ENCOUNTER — Other Ambulatory Visit: Payer: Self-pay | Admitting: Unknown Physician Specialty

## 2015-02-22 ENCOUNTER — Other Ambulatory Visit: Payer: Self-pay | Admitting: Unknown Physician Specialty

## 2015-03-23 ENCOUNTER — Ambulatory Visit: Payer: Commercial Managed Care - PPO | Admitting: Unknown Physician Specialty

## 2015-03-30 ENCOUNTER — Encounter: Payer: Self-pay | Admitting: Unknown Physician Specialty

## 2015-03-30 ENCOUNTER — Ambulatory Visit (INDEPENDENT_AMBULATORY_CARE_PROVIDER_SITE_OTHER): Payer: Commercial Managed Care - PPO | Admitting: Unknown Physician Specialty

## 2015-03-30 VITALS — BP 130/77 | HR 65 | Temp 98.6°F | Ht 64.2 in | Wt 212.0 lb

## 2015-03-30 DIAGNOSIS — Z794 Long term (current) use of insulin: Secondary | ICD-10-CM | POA: Diagnosis not present

## 2015-03-30 DIAGNOSIS — N182 Chronic kidney disease, stage 2 (mild): Secondary | ICD-10-CM

## 2015-03-30 DIAGNOSIS — E1122 Type 2 diabetes mellitus with diabetic chronic kidney disease: Secondary | ICD-10-CM

## 2015-03-30 LAB — BAYER DCA HB A1C WAIVED: HB A1C (BAYER DCA - WAIVED): 7.2 % — ABNORMAL HIGH (ref <7.0)

## 2015-03-30 NOTE — Progress Notes (Signed)
   BP 130/77 mmHg  Pulse 65  Temp(Src) 98.6 F (37 C)  Ht 5' 4.2" (1.631 m)  Wt 212 lb (96.163 kg)  BMI 36.15 kg/m2  SpO2 94%  LMP  (LMP Unknown)   Subjective:    [name], female    DOB: 1955-04-01, 60 y.o.   MRN: [id number]  HPI: April Johnson is a 60 y.o. female  Chief Complaint  Patient presents with  . Diabetes  . Hyperlipidemia  . Hypertension   Diabetes:  Using medications without difficulties No hypoglycemic episodes: No hyperglycemic episodes: Feet problems: not helping Blood Sugars averaging: 114 this AM eye exam within last year 60 units of Levemir QHS  Hypertension:  Using medications without difficulty Average home BPs Not checking  Using medication without problems or lightheadedness No chest pain with exertion or shortness of breath No Edema   Elevated Cholesterol: Using medications without problems: No Muscle aches:  Diet compliance: Exercise:      Relevant past medical, surgical, family and social history reviewed and updated as indicated. Interim medical history since our last visit reviewed. Allergies and medications reviewed and updated.  Review of Systems  Per HPI unless specifically indicated above     Objective:    BP 130/77 mmHg  Pulse 65  Temp(Src) 98.6 F (37 C)  Ht 5' 4.2" (1.631 m)  Wt 212 lb (96.163 kg)  BMI 36.15 kg/m2  SpO2 94%  LMP  (LMP Unknown)  Wt Readings from Last 3 Encounters:  03/30/15 212 lb (96.163 kg)  01/12/15 214 lb (97.07 kg)  12/24/14 215 lb 3.2 oz (97.614 kg)    Physical Exam  Results for orders placed or performed during the hospital encounter of 01/12/15  Glucose, capillary  Result Value Ref Range   Glucose-Capillary 127 (H) 65 - 99 mg/dL  Glucose, capillary  Result Value Ref Range   Glucose-Capillary 140 (H) 65 - 99 mg/dL      Assessment & Plan:   Problem List Items Addressed This Visit      Unprioritized   Diabetes mellitus with renal manifestation  ([place]) - Primary    Hgb A1C is 7.2.  Will work on lifestyle changes.        Relevant Orders   Comprehensive metabolic panel   Bayer DCA Hb A1c Waived      No change in treatment at this time.    Follow up plan: Return in about 3 months (around 06/28/2015).

## 2015-03-30 NOTE — Assessment & Plan Note (Signed)
Hgb A1C is 7.2.  Will work on lifestyle changes.

## 2015-03-31 LAB — COMPREHENSIVE METABOLIC PANEL
A/G RATIO: 2 (ref 1.1–2.5)
ALK PHOS: 90 IU/L (ref 39–117)
ALT: 15 IU/L (ref 0–32)
AST: 15 IU/L (ref 0–40)
Albumin: 4.5 g/dL (ref 3.5–5.5)
BUN/Creatinine Ratio: 18 (ref 9–23)
BUN: 14 mg/dL (ref 6–24)
Bilirubin Total: 0.4 mg/dL (ref 0.0–1.2)
CALCIUM: 10.1 mg/dL (ref 8.7–10.2)
CO2: 25 mmol/L (ref 18–29)
Chloride: 98 mmol/L (ref 96–106)
Creatinine, Ser: 0.76 mg/dL (ref 0.57–1.00)
GFR calc Af Amer: 100 mL/min/{1.73_m2} (ref >59)
GFR, EST NON AFRICAN AMERICAN: 87 mL/min/{1.73_m2} (ref >59)
GLOBULIN, TOTAL: 2.3 g/dL (ref 1.5–4.5)
Glucose: 103 mg/dL — ABNORMAL HIGH (ref 65–99)
POTASSIUM: 4.5 mmol/L (ref 3.5–5.2)
SODIUM: 142 mmol/L (ref 134–144)
Total Protein: 6.8 g/dL (ref 6.0–8.5)

## 2015-04-01 ENCOUNTER — Telehealth: Payer: Self-pay

## 2015-04-01 ENCOUNTER — Other Ambulatory Visit: Payer: Self-pay | Admitting: Unknown Physician Specialty

## 2015-04-01 ENCOUNTER — Other Ambulatory Visit: Payer: Self-pay

## 2015-04-01 MED ORDER — CANAGLIFLOZIN 300 MG PO TABS: 300.0000 mg | ORAL_TABLET | Freq: Every day | ORAL | Status: DC

## 2015-04-01 NOTE — Telephone Encounter (Signed)
Opened in error

## 2015-04-01 NOTE — Telephone Encounter (Signed)
Pharmacy is [facility].

## 2015-04-01 NOTE — Telephone Encounter (Signed)
Called and let patient know rx was sent to pharmacy. 

## 2015-04-12 ENCOUNTER — Other Ambulatory Visit: Payer: Self-pay | Admitting: Unknown Physician Specialty

## 2015-05-21 ENCOUNTER — Other Ambulatory Visit: Payer: Self-pay | Admitting: Unknown Physician Specialty

## 2015-06-29 ENCOUNTER — Ambulatory Visit: Payer: Commercial Managed Care - PPO | Admitting: Unknown Physician Specialty

## 2015-06-29 ENCOUNTER — Other Ambulatory Visit: Payer: Self-pay | Admitting: Unknown Physician Specialty

## 2015-07-06 ENCOUNTER — Ambulatory Visit: Payer: Commercial Managed Care - PPO | Admitting: Unknown Physician Specialty

## 2015-07-18 ENCOUNTER — Other Ambulatory Visit: Payer: Self-pay | Admitting: Unknown Physician Specialty

## 2015-07-20 ENCOUNTER — Encounter: Payer: Self-pay | Admitting: Unknown Physician Specialty

## 2015-07-20 ENCOUNTER — Ambulatory Visit (INDEPENDENT_AMBULATORY_CARE_PROVIDER_SITE_OTHER): Payer: Commercial Managed Care - PPO | Admitting: Unknown Physician Specialty

## 2015-07-20 VITALS — BP 126/81 | HR 78 | Temp 98.4°F | Ht 65.0 in | Wt 210.8 lb

## 2015-07-20 DIAGNOSIS — N182 Chronic kidney disease, stage 2 (mild): Secondary | ICD-10-CM | POA: Diagnosis not present

## 2015-07-20 DIAGNOSIS — E785 Hyperlipidemia, unspecified: Secondary | ICD-10-CM | POA: Diagnosis not present

## 2015-07-20 DIAGNOSIS — G2581 Restless legs syndrome: Secondary | ICD-10-CM | POA: Diagnosis not present

## 2015-07-20 DIAGNOSIS — I10 Essential (primary) hypertension: Secondary | ICD-10-CM

## 2015-07-20 DIAGNOSIS — E1122 Type 2 diabetes mellitus with diabetic chronic kidney disease: Secondary | ICD-10-CM | POA: Diagnosis not present

## 2015-07-20 DIAGNOSIS — Z794 Long term (current) use of insulin: Secondary | ICD-10-CM

## 2015-07-20 LAB — BAYER DCA HB A1C WAIVED: HB A1C: 7.3 % — AB (ref <7.0)

## 2015-07-20 LAB — LIPID PANEL PICCOLO, WAIVED
CHOL/HDL RATIO PICCOLO,WAIVE: 3.1 mg/dL
CHOLESTEROL PICCOLO, WAIVED: 153 mg/dL (ref <200)
HDL Chol Piccolo, Waived: 50 mg/dL — ABNORMAL LOW (ref >59)
LDL Chol Calc Piccolo Waived: 54 mg/dL (ref <100)
TRIGLYCERIDES PICCOLO,WAIVED: 245 mg/dL — AB (ref <150)
VLDL Chol Calc Piccolo,Waive: 49 mg/dL — ABNORMAL HIGH (ref <30)

## 2015-07-20 MED ORDER — PIOGLITAZONE HCL-METFORMIN HCL 15-500 MG PO TABS: 1.0000 | ORAL_TABLET | Freq: Two times a day (BID) | ORAL | Status: DC

## 2015-07-20 MED ORDER — CANAGLIFLOZIN 300 MG PO TABS: 300.0000 mg | ORAL_TABLET | Freq: Every day | ORAL | Status: DC

## 2015-07-20 MED ORDER — ATORVASTATIN CALCIUM 20 MG PO TABS: 20.0000 mg | ORAL_TABLET | Freq: Every day | ORAL | Status: DC

## 2015-07-20 MED ORDER — TELMISARTAN-HCTZ 80-25 MG PO TABS: 1.0000 | ORAL_TABLET | Freq: Every day | ORAL | Status: DC

## 2015-07-20 MED ORDER — PRAMIPEXOLE DIHYDROCHLORIDE 0.5 MG PO TABS: 0.5000 mg | ORAL_TABLET | Freq: Three times a day (TID) | ORAL | Status: DC

## 2015-07-20 NOTE — Progress Notes (Signed)
BP 126/81 mmHg  Pulse 78  Temp(Src) 98.4 F (36.9 C)  Ht '5\' 5"'  (1.651 m)  Wt 210 lb 12.8 oz (95.618 kg)  BMI 35.08 kg/m2  SpO2 94%  LMP  (LMP Unknown)   Subjective:    [name], female    DOB: Jul 25, 1955, 60 y.o.   MRN: [id number]  HPI: April Johnson is a 60 y.o. female  Chief Complaint  Patient presents with  . Diabetes    pt states last eye exam was in 2010 or 2011  . Hyperlipidemia  . Hypertension   Diabetes:  Using medications without difficulties No hypoglycemic episodes No hyperglycemic episodes Feet problems Blood Sugars averaging: 110-134 in the A< eye exam within last year: no  Hypertension:  Using medications without difficulty Average home BPs   Using medication without problems or lightheadedness No chest pain with exertion or shortness of breath No Edema  Elevated Cholesterol: Using medications without problems No Muscle aches  Diet compliance:losing weight Exercise: walking dog 7 times/day  RLS States legs keep going at night.  Fine during the day when up.  This has been going on for a year.    Relevant past medical, surgical, family and social history reviewed and updated as indicated. Interim medical history since our last visit reviewed. Allergies and medications reviewed and updated.  Review of Systems  Per HPI unless specifically indicated above     Objective:    BP 126/81 mmHg  Pulse 78  Temp(Src) 98.4 F (36.9 C)  Ht '5\' 5"'  (1.651 m)  Wt 210 lb 12.8 oz (95.618 kg)  BMI 35.08 kg/m2  SpO2 94%  LMP  (LMP Unknown)  Wt Readings from Last 3 Encounters:  07/20/15 210 lb 12.8 oz (95.618 kg)  03/30/15 212 lb (96.163 kg)  01/12/15 214 lb (97.07 kg)    Physical Exam  Constitutional: She is oriented to person, place, and time. She appears well-developed and well-nourished. No distress.  HENT:  Head: Normocephalic and atraumatic.  Eyes: Conjunctivae and lids are normal. Right eye exhibits no discharge.  Left eye exhibits no discharge. No scleral icterus.  Neck: Normal range of motion. Neck supple. No JVD present. Carotid bruit is not present.  Cardiovascular: Normal rate, regular rhythm and normal heart sounds.   Pulmonary/Chest: Effort normal and breath sounds normal.  Abdominal: Normal appearance. There is no splenomegaly or hepatomegaly.  Musculoskeletal: Normal range of motion.  Neurological: She is alert and oriented to person, place, and time.  Skin: Skin is warm, dry and intact. No rash noted. No pallor.  Psychiatric: She has a normal mood and affect. Her behavior is normal. Judgment and thought content normal.    Results for orders placed or performed in visit on 03/30/15  Comprehensive metabolic panel  Result Value Ref Range   Glucose 103 (H) 65 - 99 mg/dL   BUN 14 6 - 24 mg/dL   Creatinine, Ser 0.76 0.57 - 1.00 mg/dL   GFR calc non Af Amer 87 >59 mL/min/1.73   GFR calc Af Amer 100 >59 mL/min/1.73   BUN/Creatinine Ratio 18 9 - 23   Sodium 142 134 - 144 mmol/L   Potassium 4.5 3.5 - 5.2 mmol/L   Chloride 98 96 - 106 mmol/L   CO2 25 18 - 29 mmol/L   Calcium 10.1 8.7 - 10.2 mg/dL   Total Protein 6.8 6.0 - 8.5 g/dL   Albumin 4.5 3.5 - 5.5 g/dL   Globulin, Total 2.3 1.5 - 4.5  g/dL   Albumin/Globulin Ratio 2.0 1.1 - 2.5   Bilirubin Total 0.4 0.0 - 1.2 mg/dL   Alkaline Phosphatase 90 39 - 117 IU/L   AST 15 0 - 40 IU/L   ALT 15 0 - 32 IU/L  Bayer DCA Hb A1c Waived  Result Value Ref Range   Bayer DCA Hb A1c Waived 7.2 (H) <7.0 %      Assessment & Plan:   Problem List Items Addressed This Visit      Unprioritized   Diabetes mellitus with renal manifestation ([facility]) - Primary    Hgb A1C is 7.3.  Not to goal but stable.  Recheck in 3 month with continued weight loss      Relevant Medications   atorvastatin (LIPITOR) 20 MG tablet   pioglitazone-metformin (ACTOPLUS MET) 15-500 MG tablet   telmisartan-hydrochlorothiazide (MICARDIS HCT) 80-25 MG tablet   canagliflozin  (INVOKANA) 300 MG TABS tablet   Other Relevant Orders   Bayer DCA Hb A1c Waived   Hyperlipidemia    LDL is 54.  Continue present meds      Relevant Medications   atorvastatin (LIPITOR) 20 MG tablet   telmisartan-hydrochlorothiazide (MICARDIS HCT) 80-25 MG tablet   Other Relevant Orders   Lipid Panel Piccolo, Waived   RLS (restless legs syndrome)    Check CBC and Ferritin.  Add Mirapex QHS      Relevant Orders   CBC with Differential/Platelet   Ferritin    Other Visit Diagnoses    Essential hypertension        Relevant Medications    atorvastatin (LIPITOR) 20 MG tablet    telmisartan-hydrochlorothiazide (MICARDIS HCT) 80-25 MG tablet    Other Relevant Orders    Comprehensive metabolic panel        Follow up plan: Return in about 3 months (around 10/20/2015).

## 2015-07-20 NOTE — Telephone Encounter (Signed)
Patient has appointment scheduled for today

## 2015-07-20 NOTE — Telephone Encounter (Signed)
Pt needs check further refills 

## 2015-07-20 NOTE — Assessment & Plan Note (Signed)
Check CBC and Ferritin.  Add Mirapex QHS

## 2015-07-20 NOTE — Assessment & Plan Note (Signed)
LDL is 54.  Continue present meds

## 2015-07-20 NOTE — Assessment & Plan Note (Addendum)
Hgb A1C is 7.3.  Not to goal but stable.  Recheck in 3 month with continued weight loss

## 2015-07-21 ENCOUNTER — Encounter: Payer: Self-pay | Admitting: Unknown Physician Specialty

## 2015-07-21 LAB — CBC WITH DIFFERENTIAL/PLATELET
BASOS ABS: 0 10*3/uL (ref 0.0–0.2)
Basos: 0 %
EOS (ABSOLUTE): 0 10*3/uL (ref 0.0–0.4)
EOS: 1 %
HEMOGLOBIN: 13.9 g/dL (ref 11.1–15.9)
Hematocrit: 41.7 % (ref 34.0–46.6)
IMMATURE GRANS (ABS): 0 10*3/uL (ref 0.0–0.1)
Immature Granulocytes: 0 %
LYMPHS ABS: 3.4 10*3/uL — AB (ref 0.7–3.1)
LYMPHS: 40 %
MCH: 27.2 pg (ref 26.6–33.0)
MCHC: 33.3 g/dL (ref 31.5–35.7)
MCV: 82 fL (ref 79–97)
MONOCYTES: 8 %
Monocytes Absolute: 0.7 10*3/uL (ref 0.1–0.9)
NEUTROS ABS: 4.3 10*3/uL (ref 1.4–7.0)
Neutrophils: 51 %
PLATELETS: 371 10*3/uL (ref 150–379)
RBC: 5.11 x10E6/uL (ref 3.77–5.28)
RDW: 15.3 % (ref 12.3–15.4)
WBC: 8.4 10*3/uL (ref 3.4–10.8)

## 2015-07-21 LAB — SPECIMEN STATUS

## 2015-07-21 LAB — COMPREHENSIVE METABOLIC PANEL
A/G RATIO: 1.8 (ref 1.2–2.2)
ALT: 13 IU/L (ref 0–32)
AST: 15 IU/L (ref 0–40)
Albumin: 4.6 g/dL (ref 3.5–5.5)
Alkaline Phosphatase: 91 IU/L (ref 39–117)
BUN/Creatinine Ratio: 27 — ABNORMAL HIGH (ref 9–23)
BUN: 21 mg/dL (ref 6–24)
Bilirubin Total: 0.5 mg/dL (ref 0.0–1.2)
CALCIUM: 10.4 mg/dL — AB (ref 8.7–10.2)
CO2: 28 mmol/L (ref 18–29)
Chloride: 97 mmol/L (ref 96–106)
Creatinine, Ser: 0.78 mg/dL (ref 0.57–1.00)
GFR, EST AFRICAN AMERICAN: 97 mL/min/{1.73_m2} (ref >59)
GFR, EST NON AFRICAN AMERICAN: 84 mL/min/{1.73_m2} (ref >59)
GLOBULIN, TOTAL: 2.6 g/dL (ref 1.5–4.5)
Glucose: 126 mg/dL — ABNORMAL HIGH (ref 65–99)
POTASSIUM: 4.5 mmol/L (ref 3.5–5.2)
SODIUM: 142 mmol/L (ref 134–144)
Total Protein: 7.2 g/dL (ref 6.0–8.5)

## 2015-07-21 LAB — FERRITIN: FERRITIN: 20 ng/mL (ref 15–150)

## 2015-07-24 ENCOUNTER — Telehealth: Payer: Self-pay | Admitting: Unknown Physician Specialty

## 2015-07-24 MED ORDER — ROPINIROLE HCL 0.5 MG PO TABS: 0.5000 mg | ORAL_TABLET | Freq: Every day | ORAL | Status: DC

## 2015-07-24 NOTE — Telephone Encounter (Signed)
Pt called stated the medication [name] gave her for restless legs makes her feel sick to her stomach. Would like to know if something else can be sent in to her pharmacy. [facility] [place]. Thanks.

## 2015-07-24 NOTE — Telephone Encounter (Signed)
Called and let patient know that new rx was sent in.

## 2015-07-24 NOTE — Telephone Encounter (Signed)
Routing to provider  

## 2015-10-14 ENCOUNTER — Other Ambulatory Visit: Payer: Self-pay | Admitting: Unknown Physician Specialty

## 2015-10-21 ENCOUNTER — Ambulatory Visit: Payer: Commercial Managed Care - PPO | Admitting: Unknown Physician Specialty

## 2015-10-26 ENCOUNTER — Other Ambulatory Visit: Payer: Self-pay | Admitting: Unknown Physician Specialty

## 2015-10-26 NOTE — Telephone Encounter (Signed)
Your patient 

## 2015-10-27 ENCOUNTER — Encounter: Payer: Self-pay | Admitting: Unknown Physician Specialty

## 2015-10-27 ENCOUNTER — Other Ambulatory Visit: Payer: Self-pay

## 2015-10-27 ENCOUNTER — Ambulatory Visit (INDEPENDENT_AMBULATORY_CARE_PROVIDER_SITE_OTHER): Payer: Commercial Managed Care - PPO | Admitting: Unknown Physician Specialty

## 2015-10-27 VITALS — BP 139/78 | HR 69 | Temp 97.5°F | Ht 64.8 in | Wt 217.6 lb

## 2015-10-27 DIAGNOSIS — I10 Essential (primary) hypertension: Secondary | ICD-10-CM | POA: Diagnosis not present

## 2015-10-27 DIAGNOSIS — E1122 Type 2 diabetes mellitus with diabetic chronic kidney disease: Secondary | ICD-10-CM | POA: Diagnosis not present

## 2015-10-27 DIAGNOSIS — N183 Chronic kidney disease, stage 3 (moderate): Secondary | ICD-10-CM

## 2015-10-27 DIAGNOSIS — Z794 Long term (current) use of insulin: Secondary | ICD-10-CM | POA: Diagnosis not present

## 2015-10-27 DIAGNOSIS — N182 Chronic kidney disease, stage 2 (mild): Secondary | ICD-10-CM | POA: Diagnosis not present

## 2015-10-27 DIAGNOSIS — Z0000 Encounter for general adult medical examination without abnormal findings: Secondary | ICD-10-CM | POA: Diagnosis not present

## 2015-10-27 DIAGNOSIS — E1159 Type 2 diabetes mellitus with other circulatory complications: Secondary | ICD-10-CM | POA: Insufficient documentation

## 2015-10-27 DIAGNOSIS — I129 Hypertensive chronic kidney disease with stage 1 through stage 4 chronic kidney disease, or unspecified chronic kidney disease: Secondary | ICD-10-CM

## 2015-10-27 DIAGNOSIS — I1310 Hypertensive heart and chronic kidney disease without heart failure, with stage 1 through stage 4 chronic kidney disease, or unspecified chronic kidney disease: Secondary | ICD-10-CM | POA: Insufficient documentation

## 2015-10-27 LAB — HEMOGLOBIN A1C

## 2015-10-27 LAB — BAYER DCA HB A1C WAIVED: HB A1C: 7.3 % — AB (ref <7.0)

## 2015-10-27 NOTE — Progress Notes (Signed)
BP 139/78 (BP Location: Left Arm, Patient Position: Sitting, Cuff Size: Large)   Pulse 69   Temp 97.5 F (36.4 C)   Ht 5' 4.8" (1.646 m)   Wt 217 lb 9.6 oz (98.7 kg)   LMP  (LMP Unknown)   SpO2 96%   BMI 36.43 kg/m    Subjective:    [name], female    DOB: December 01, 1955, 60 y.o.   MRN: [id number]  HPI: April Johnson is a 60 y.o. female  Chief Complaint  Patient presents with  . Diabetes    pt states she plans to make eye exam appointment soon  . Hyperlipidemia  . Hypertension   Diabetes: Using medications without difficulties No hypoglycemic episodes No hyperglycemic episodes Feet problems: none Blood Sugars averaging: 140 this AM but as low as 80 eye exam within last year: no but planning on it Last Hgb A1C: 7.3  Hypertension  Using medications without difficulty Average home BPs"runs good"  Using medication without problems or lightheadedness No chest pain with exertion or shortness of breath No Edema  Elevated Cholesterol Using medications without problems No Muscle aches  Diet: eating more lately Exercise: walking dog  Relevant past medical, surgical, family and social history reviewed and updated as indicated. Interim medical history since our last visit reviewed. Allergies and medications reviewed and updated.  Review of Systems  Per HPI unless specifically indicated above     Objective:    BP 139/78 (BP Location: Left Arm, Patient Position: Sitting, Cuff Size: Large)   Pulse 69   Temp 97.5 F (36.4 C)   Ht 5' 4.8" (1.646 m)   Wt 217 lb 9.6 oz (98.7 kg)   LMP  (LMP Unknown)   SpO2 96%   BMI 36.43 kg/m   Wt Readings from Last 3 Encounters:  10/27/15 217 lb 9.6 oz (98.7 kg)  07/20/15 210 lb 12.8 oz (95.6 kg)  03/30/15 212 lb (96.2 kg)    Physical Exam  Constitutional: She is oriented to person, place, and time. She appears well-developed and well-nourished. No distress.  HENT:  Head: Normocephalic and  atraumatic.  Eyes: Conjunctivae and lids are normal. Right eye exhibits no discharge. Left eye exhibits no discharge. No scleral icterus.  Neck: Normal range of motion. Neck supple. No JVD present. Carotid bruit is not present.  Cardiovascular: Normal rate, regular rhythm and normal heart sounds.   Pulmonary/Chest: Effort normal and breath sounds normal.  Abdominal: Normal appearance. There is no splenomegaly or hepatomegaly.  Musculoskeletal: Normal range of motion.  Neurological: She is alert and oriented to person, place, and time.  Skin: Skin is warm, dry and intact. No rash noted. No pallor.  Psychiatric: She has a normal mood and affect. Her behavior is normal. Judgment and thought content normal.    Results for orders placed or performed in visit on 07/20/15  Bayer DCA Hb A1c Waived  Result Value Ref Range   Bayer DCA Hb A1c Waived 7.3 (H) <7.0 %  Comprehensive metabolic panel  Result Value Ref Range   Glucose 126 (H) 65 - 99 mg/dL   BUN 21 6 - 24 mg/dL   Creatinine, Ser 0.78 0.57 - 1.00 mg/dL   GFR calc non Af Amer 84 >59 mL/min/1.73   GFR calc Af Amer 97 >59 mL/min/1.73   BUN/Creatinine Ratio 27 (H) 9 - 23   Sodium 142 134 - 144 mmol/L   Potassium 4.5 3.5 - 5.2 mmol/L   Chloride 97 96 -  106 mmol/L   CO2 28 18 - 29 mmol/L   Calcium 10.4 (H) 8.7 - 10.2 mg/dL   Total Protein 7.2 6.0 - 8.5 g/dL   Albumin 4.6 3.5 - 5.5 g/dL   Globulin, Total 2.6 1.5 - 4.5 g/dL   Albumin/Globulin Ratio 1.8 1.2 - 2.2   Bilirubin Total 0.5 0.0 - 1.2 mg/dL   Alkaline Phosphatase 91 39 - 117 IU/L   AST 15 0 - 40 IU/L   ALT 13 0 - 32 IU/L  Lipid Panel Piccolo, Waived  Result Value Ref Range   Cholesterol Piccolo, Waived 153 <200 mg/dL   HDL Chol Piccolo, Waived 50 (L) >59 mg/dL   Triglycerides Piccolo,Waived 245 (H) <150 mg/dL   Chol/HDL Ratio Piccolo,Waive 3.1 mg/dL   LDL Chol Calc Piccolo Waived 54 <100 mg/dL   VLDL Chol Calc Piccolo,Waive 49 (H) <30 mg/dL  CBC with Differential/Platelet    Result Value Ref Range   WBC 8.4 3.4 - 10.8 x10E3/uL   RBC 5.11 3.77 - 5.28 x10E6/uL   Hemoglobin 13.9 11.1 - 15.9 g/dL   Hematocrit 41.7 34.0 - 46.6 %   MCV 82 79 - 97 fL   MCH 27.2 26.6 - 33.0 pg   MCHC 33.3 31.5 - 35.7 g/dL   RDW 15.3 12.3 - 15.4 %   Platelets 371 150 - 379 x10E3/uL   Neutrophils 51 %   Lymphs 40 %   Monocytes 8 %   Eos 1 %   Basos 0 %   Neutrophils Absolute 4.3 1.4 - 7.0 x10E3/uL   Lymphocytes Absolute 3.4 (H) 0.7 - 3.1 x10E3/uL   Monocytes Absolute 0.7 0.1 - 0.9 x10E3/uL   EOS (ABSOLUTE) 0.0 0.0 - 0.4 x10E3/uL   Basophils Absolute 0.0 0.0 - 0.2 x10E3/uL   Immature Granulocytes 0 %   Immature Grans (Abs) 0.0 0.0 - 0.1 x10E3/uL  Ferritin  Result Value Ref Range   Ferritin 20 15 - 150 ng/mL  Specimen Status  Result Value Ref Range   WBC WILL FOLLOW    RBC WILL FOLLOW    Hemoglobin WILL FOLLOW    Hematocrit WILL FOLLOW    MCV WILL FOLLOW    MCH WILL FOLLOW    MCHC WILL FOLLOW    RDW WILL FOLLOW    Platelets WILL FOLLOW    Neutrophils WILL FOLLOW    Lymphs WILL FOLLOW    Monocytes WILL FOLLOW    Eos WILL FOLLOW    Basos WILL FOLLOW    Neutrophils Absolute WILL FOLLOW    Lymphocytes Absolute WILL FOLLOW    Monocytes Absolute WILL FOLLOW    EOS (ABSOLUTE) WILL FOLLOW    Basophils Absolute WILL FOLLOW    Immature Granulocytes WILL FOLLOW    Immature Grans (Abs) WILL FOLLOW       Assessment & Plan:   Problem List Items Addressed This Visit      Unprioritized   Diabetes mellitus with renal manifestation (HCC) - Primary    Hgb A1C is 7.3.  She will work on her diet.        Relevant Orders   Comprehensive metabolic panel   Bayer DCA Hb A1c Waived   Hypertension    Stable, continue present medications.        Hypertensive chronic kidney disease    Other Visit Diagnoses    Routine general medical examination at a health care facility       Relevant Orders   MM DIGITAL SCREENING BILATERAL  Follow up plan: Return in about 3  months (around 01/27/2016).

## 2015-10-27 NOTE — Assessment & Plan Note (Signed)
Hgb A1C is 7.3.  She will work on her diet.

## 2015-10-27 NOTE — Assessment & Plan Note (Signed)
Stable, continue present medications.   

## 2015-10-28 ENCOUNTER — Ambulatory Visit: Payer: Medicare Other | Admitting: Unknown Physician Specialty

## 2015-10-28 ENCOUNTER — Encounter: Payer: Self-pay | Admitting: Unknown Physician Specialty

## 2015-10-28 LAB — COMPREHENSIVE METABOLIC PANEL WITH GFR
ALT: 13 [IU]/L (ref 0–32)
AST: 12 [IU]/L (ref 0–40)
Albumin/Globulin Ratio: 1.8 (ref 1.2–2.2)
Albumin: 4.2 g/dL (ref 3.5–5.5)
Alkaline Phosphatase: 81 [IU]/L (ref 39–117)
BUN/Creatinine Ratio: 23 (ref 9–23)
BUN: 18 mg/dL (ref 6–24)
Bilirubin Total: 0.4 mg/dL (ref 0.0–1.2)
CO2: 27 mmol/L (ref 18–29)
Calcium: 9.8 mg/dL (ref 8.7–10.2)
Chloride: 97 mmol/L (ref 96–106)
Creatinine, Ser: 0.79 mg/dL (ref 0.57–1.00)
GFR calc Af Amer: 95 mL/min/{1.73_m2}
GFR calc non Af Amer: 82 mL/min/{1.73_m2}
Globulin, Total: 2.4 g/dL (ref 1.5–4.5)
Glucose: 115 mg/dL — ABNORMAL HIGH (ref 65–99)
Potassium: 4.7 mmol/L (ref 3.5–5.2)
Sodium: 139 mmol/L (ref 134–144)
Total Protein: 6.6 g/dL (ref 6.0–8.5)

## 2016-01-18 ENCOUNTER — Other Ambulatory Visit: Payer: Self-pay

## 2016-01-18 MED ORDER — AMLODIPINE BESYLATE 5 MG PO TABS: 5.0000 mg | ORAL_TABLET | Freq: Every day | ORAL | 0 refills | Status: DC

## 2016-02-01 ENCOUNTER — Ambulatory Visit: Payer: Medicare Other | Admitting: Unknown Physician Specialty

## 2016-02-10 ENCOUNTER — Encounter: Payer: Self-pay | Admitting: Unknown Physician Specialty

## 2016-02-10 ENCOUNTER — Ambulatory Visit (INDEPENDENT_AMBULATORY_CARE_PROVIDER_SITE_OTHER): Payer: Commercial Managed Care - PPO | Admitting: Unknown Physician Specialty

## 2016-02-10 VITALS — BP 121/78 | HR 66 | Temp 98.4°F | Wt 223.4 lb

## 2016-02-10 DIAGNOSIS — E782 Mixed hyperlipidemia: Secondary | ICD-10-CM

## 2016-02-10 DIAGNOSIS — Z794 Long term (current) use of insulin: Secondary | ICD-10-CM | POA: Diagnosis not present

## 2016-02-10 DIAGNOSIS — I10 Essential (primary) hypertension: Secondary | ICD-10-CM

## 2016-02-10 DIAGNOSIS — E1122 Type 2 diabetes mellitus with diabetic chronic kidney disease: Secondary | ICD-10-CM | POA: Diagnosis not present

## 2016-02-10 DIAGNOSIS — Z23 Encounter for immunization: Secondary | ICD-10-CM | POA: Diagnosis not present

## 2016-02-10 DIAGNOSIS — N182 Chronic kidney disease, stage 2 (mild): Secondary | ICD-10-CM

## 2016-02-10 LAB — BAYER DCA HB A1C WAIVED: HB A1C (BAYER DCA - WAIVED): 7.4 % — ABNORMAL HIGH (ref <7.0)

## 2016-02-10 LAB — MICROALBUMIN, URINE WAIVED
CREATININE, URINE WAIVED: 50 mg/dL (ref 10–300)
Microalb, Ur Waived: 10 mg/L (ref 0–19)
Microalb/Creat Ratio: <30 mg/g (ref <30)

## 2016-02-10 NOTE — Progress Notes (Signed)
BP 121/78 (BP Location: Left Arm, Patient Position: Sitting, Cuff Size: Large)   Pulse 66   Temp 98.4 F (36.9 C)   Wt 223 lb 6.4 oz (101.3 kg)   LMP  (LMP Unknown)   SpO2 97%   BMI 37.41 kg/m    Subjective:    [name], female    DOB: 05-19-1955, 60 y.o.   MRN: [id number]  HPI: April Johnson is a 60 y.o. female  Chief Complaint  Patient presents with  . Diabetes    pt states she has not had an eye exam yet   . Hyperlipidemia  . Hypertension   Diabetes: Using medications without difficulties.  Taking 60 units of Levimir yet No hypoglycemic episodes No hyperglycemic episodes Feet problems: none Blood Sugars averaging: 111 this AM eye exam within last year Last Hgb A1C: 7.3  Hypertension  Using medications without difficulty Average home BPs not checking  Using medication without problems or lightheadedness No chest pain with exertion or shortness of breath No Edema  Elevated Cholesterol Using medications without problems No Muscle aches  Diet: In general eating well Exercise: Walks with dog   Relevant past medical, surgical, family and social history reviewed and updated as indicated. Interim medical history since our last visit reviewed. Allergies and medications reviewed and updated.  Review of Systems  Per HPI unless specifically indicated above     Objective:    BP 121/78 (BP Location: Left Arm, Patient Position: Sitting, Cuff Size: Large)   Pulse 66   Temp 98.4 F (36.9 C)   Wt 223 lb 6.4 oz (101.3 kg)   LMP  (LMP Unknown)   SpO2 97%   BMI 37.41 kg/m   Wt Readings from Last 3 Encounters:  02/10/16 223 lb 6.4 oz (101.3 kg)  10/27/15 217 lb 9.6 oz (98.7 kg)  07/20/15 210 lb 12.8 oz (95.6 kg)    Physical Exam  Constitutional: She is oriented to person, place, and time. She appears well-developed and well-nourished. No distress.  HENT:  Head: Normocephalic and atraumatic.  Eyes: Conjunctivae and lids are  normal. Right eye exhibits no discharge. Left eye exhibits no discharge. No scleral icterus.  Neck: Normal range of motion. Neck supple. No JVD present. Carotid bruit is not present.  Cardiovascular: Normal rate, regular rhythm and normal heart sounds.   Pulmonary/Chest: Effort normal and breath sounds normal.  Abdominal: Normal appearance. There is no splenomegaly or hepatomegaly.  Musculoskeletal: Normal range of motion.  Neurological: She is alert and oriented to person, place, and time.  Skin: Skin is warm, dry and intact. No rash noted. No pallor.  Psychiatric: She has a normal mood and affect. Her behavior is normal. Judgment and thought content normal.    Results for orders placed or performed in visit on 10/27/15  Hemoglobin A1c  Result Value Ref Range   Hemoglobin A1C 7.3%       Assessment & Plan:   Problem List Items Addressed This Visit      Unprioritized   Diabetes mellitus with renal manifestation (HCC)    Hgb A1C is 7.4.  Will work on lifestyle      Relevant Orders   Bayer DCA Hb A1c Waived   Microalbumin, Urine Waived   Hyperlipidemia    Check lipid panel      Relevant Orders   Lipid Panel w/o Chol/HDL Ratio   Hypertension    Stable, continue present medications.        Relevant  Orders   Comprehensive metabolic panel   Microalbumin, Urine Waived   Uric acid    Other Visit Diagnoses    Need for influenza vaccination    -  Primary   Relevant Orders   Flu Vaccine QUAD 36+ mos IM (Completed)       Follow up plan: No Follow-up on file.

## 2016-02-10 NOTE — Patient Instructions (Signed)

## 2016-02-10 NOTE — Assessment & Plan Note (Signed)
Check lipid panel  

## 2016-02-10 NOTE — Assessment & Plan Note (Signed)
Hgb A1C is 7.4.  Will work on lifestyle

## 2016-02-10 NOTE — Assessment & Plan Note (Signed)
Stable, continue present medications.   

## 2016-02-11 LAB — COMPREHENSIVE METABOLIC PANEL
A/G RATIO: 1.8 (ref 1.2–2.2)
ALT: 18 IU/L (ref 0–32)
AST: 15 IU/L (ref 0–40)
Albumin: 4.2 g/dL (ref 3.5–5.5)
Alkaline Phosphatase: 83 IU/L (ref 39–117)
BILIRUBIN TOTAL: 0.5 mg/dL (ref 0.0–1.2)
BUN/Creatinine Ratio: 20 (ref 9–23)
BUN: 15 mg/dL (ref 6–24)
CALCIUM: 9.9 mg/dL (ref 8.7–10.2)
CHLORIDE: 98 mmol/L (ref 96–106)
CO2: 26 mmol/L (ref 18–29)
Creatinine, Ser: 0.74 mg/dL (ref 0.57–1.00)
GFR calc Af Amer: 103 mL/min/{1.73_m2} (ref >59)
GFR, EST NON AFRICAN AMERICAN: 89 mL/min/{1.73_m2} (ref >59)
Globulin, Total: 2.4 g/dL (ref 1.5–4.5)
Glucose: 79 mg/dL (ref 65–99)
POTASSIUM: 4.4 mmol/L (ref 3.5–5.2)
Sodium: 141 mmol/L (ref 134–144)
Total Protein: 6.6 g/dL (ref 6.0–8.5)

## 2016-02-11 LAB — URIC ACID: URIC ACID: 5.6 mg/dL (ref 2.5–7.1)

## 2016-02-11 LAB — LIPID PANEL W/O CHOL/HDL RATIO
Cholesterol, Total: 160 mg/dL (ref 100–199)
HDL: 52 mg/dL (ref >39)
LDL Calculated: 64 mg/dL (ref 0–99)
TRIGLYCERIDES: 222 mg/dL — AB (ref 0–149)
VLDL Cholesterol Cal: 44 mg/dL — ABNORMAL HIGH (ref 5–40)

## 2016-02-12 ENCOUNTER — Encounter: Payer: Self-pay | Admitting: Unknown Physician Specialty

## 2016-02-20 ENCOUNTER — Other Ambulatory Visit: Payer: Self-pay | Admitting: Unknown Physician Specialty

## 2016-03-23 ENCOUNTER — Other Ambulatory Visit: Payer: Self-pay | Admitting: Unknown Physician Specialty

## 2016-04-16 ENCOUNTER — Other Ambulatory Visit: Payer: Self-pay | Admitting: Unknown Physician Specialty

## 2016-04-22 ENCOUNTER — Other Ambulatory Visit: Payer: Self-pay | Admitting: *Deleted

## 2016-04-22 MED ORDER — ATORVASTATIN CALCIUM 20 MG PO TABS: 20.0000 mg | ORAL_TABLET | Freq: Every day | ORAL | 0 refills | Status: DC

## 2016-05-09 ENCOUNTER — Other Ambulatory Visit: Payer: Self-pay | Admitting: Unknown Physician Specialty

## 2016-05-25 ENCOUNTER — Ambulatory Visit: Payer: Medicare Other | Admitting: Unknown Physician Specialty

## 2016-05-25 LAB — HM DIABETES EYE EXAM

## 2016-06-01 ENCOUNTER — Encounter: Payer: Self-pay | Admitting: Unknown Physician Specialty

## 2016-06-01 ENCOUNTER — Ambulatory Visit (INDEPENDENT_AMBULATORY_CARE_PROVIDER_SITE_OTHER): Payer: Commercial Managed Care - PPO | Admitting: Unknown Physician Specialty

## 2016-06-01 ENCOUNTER — Other Ambulatory Visit: Payer: Self-pay | Admitting: Unknown Physician Specialty

## 2016-06-01 VITALS — BP 102/69 | HR 86 | Temp 98.1°F | Ht 64.7 in | Wt 220.8 lb

## 2016-06-01 DIAGNOSIS — Z794 Long term (current) use of insulin: Secondary | ICD-10-CM | POA: Diagnosis not present

## 2016-06-01 DIAGNOSIS — B029 Zoster without complications: Secondary | ICD-10-CM

## 2016-06-01 DIAGNOSIS — E1122 Type 2 diabetes mellitus with diabetic chronic kidney disease: Secondary | ICD-10-CM

## 2016-06-01 DIAGNOSIS — S60221A Contusion of right hand, initial encounter: Secondary | ICD-10-CM | POA: Diagnosis not present

## 2016-06-01 DIAGNOSIS — I10 Essential (primary) hypertension: Secondary | ICD-10-CM | POA: Diagnosis not present

## 2016-06-01 DIAGNOSIS — N182 Chronic kidney disease, stage 2 (mild): Secondary | ICD-10-CM

## 2016-06-01 LAB — BAYER DCA HB A1C WAIVED: HB A1C (BAYER DCA - WAIVED): 7.7 % — ABNORMAL HIGH (ref <7.0)

## 2016-06-01 MED ORDER — TRIAMCINOLONE ACETONIDE 0.1 % EX CREA: 1.0000 "application " | TOPICAL_CREAM | Freq: Two times a day (BID) | CUTANEOUS | 0 refills | Status: DC

## 2016-06-01 MED ORDER — EXENATIDE ER 2 MG/0.85ML ~~LOC~~ AUIJ: 2.0000 mg | AUTO-INJECTOR | SUBCUTANEOUS | 3 refills | Status: DC

## 2016-06-01 NOTE — Progress Notes (Signed)
BP 102/69 (BP Location: Left Arm, Patient Position: Sitting, Cuff Size: Large)   Pulse 86   Temp 98.1 F (36.7 C)   Ht 5' 4.7" (1.643 m)   Wt 220 lb 12.8 oz (100.2 kg)   LMP  (LMP Unknown)   SpO2 95%   BMI 37.08 kg/m    Subjective:    [name], female    DOB: 02-Oct-1955, 61 y.o.   MRN: [id number]  HPI: April Johnson is a 61 y.o. female  Chief Complaint  Patient presents with  . Diabetes  . Hyperlipidemia  . Hypertension  . Edema    left hand, states she injured her hand 2 weeks ago   . Rash    pt states she has a rash on her right side that came up a few weeks ago, states it does itch    Diabetes: Using medications without difficulties. Taking 20 units of Levimir at times in the AM and 60 units in the PM No hypoglycemic episodes No hyperglycemic episodes Feet problems: none Blood Sugars averaging: 154 this AM eye exam within last year Last Hgb A1C: 7.4  Hypertension  Using medications without difficulty Average home BPs Not checking  Using medication without problems or lightheadedness No chest pain with exertion or shortness of breath No Edema  Elevated Cholesterol Using medications without problems No Muscle aches  Diet/Exercise: lost 3 pounds and thinks she can get out more  Hand Mashed hand 3 weeks ago.  Has a "knot" at the top of her hand which she is concerned about  Rash Rash on right side for about 2 weeks and feeling like it scappbed over.  It is itchy rather than painful.        Relevant past medical, surgical, family and social history reviewed and updated as indicated. Interim medical history since our last visit reviewed. Allergies and medications reviewed and updated.  Review of Systems  Per HPI unless specifically indicated above     Objective:    BP 102/69 (BP Location: Left Arm, Patient Position: Sitting, Cuff Size: Large)   Pulse 86   Temp 98.1 F (36.7 C)   Ht 5' 4.7" (1.643 m)   Wt 220 lb  12.8 oz (100.2 kg)   LMP  (LMP Unknown)   SpO2 95%   BMI 37.08 kg/m   Wt Readings from Last 3 Encounters:  06/01/16 220 lb 12.8 oz (100.2 kg)  02/10/16 223 lb 6.4 oz (101.3 kg)  10/27/15 217 lb 9.6 oz (98.7 kg)    Physical Exam  Constitutional: She is oriented to person, place, and time. She appears well-developed and well-nourished. No distress.  HENT:  Head: Normocephalic and atraumatic.  Eyes: Conjunctivae and lids are normal. Right eye exhibits no discharge. Left eye exhibits no discharge. No scleral icterus.  Neck: Normal range of motion. Neck supple. No JVD present. Carotid bruit is not present.  Cardiovascular: Normal rate, regular rhythm and normal heart sounds.   Pulmonary/Chest: Effort normal and breath sounds normal.  Abdominal: Normal appearance. There is no splenomegaly or hepatomegaly.  Musculoskeletal: Normal range of motion.  Hematoma right hand  Neurological: She is alert and oriented to person, place, and time.  Skin: Skin is warm, dry and intact. No rash noted. No pallor.     Pt with right truncal macular erythemetous rash dermatomal distribution  Psychiatric: She has a normal mood and affect. Her behavior is normal. Judgment and thought content normal.    Results for orders  placed or performed in visit on 05/26/16  HM DIABETES EYE EXAM  Result Value Ref Range   HM Diabetic Eye Exam No Retinopathy No Retinopathy      Assessment & Plan:   Problem List Items Addressed This Visit      Unprioritized   Diabetes mellitus with renal manifestation ([place])    Worsening control.  Hgb A1C is 7.7.  Taking up to  80 untis of insulin many days.  Start Bcise weekly.  Samples and rx given.  Decrease insulinto 40 units and titrate up from there      Relevant Medications   Exenatide ER (BYDUREON BCISE) 2 MG/0.85ML AUIJ   Other Relevant Orders   Bayer DCA Hb A1c Waived   Comprehensive metabolic panel   Hypertension - Primary   Relevant Orders   Comprehensive metabolic  panel    Other Visit Diagnoses    Paroxysmal hematoma of right hand, initial encounter       Reassurance given it should resolve without intervention   Herpes zoster without complication       Pt ed.  Rx steroid cream   Paroxysmal hematoma of right hand, initial encounter       reassurance given that it should resolve       Follow up plan: Return in about 3 months (around 09/01/2016).

## 2016-06-01 NOTE — Assessment & Plan Note (Addendum)
Worsening control.  Hgb A1C is 7.7.  Taking up to  80 untis of insulin many days.  Start Bcise weekly.  Samples and rx given.  Decrease insulinto 40 units and titrate up from there

## 2016-06-02 ENCOUNTER — Encounter: Payer: Self-pay | Admitting: Unknown Physician Specialty

## 2016-06-02 LAB — COMPREHENSIVE METABOLIC PANEL
A/G RATIO: 1.9 (ref 1.2–2.2)
ALBUMIN: 4.5 g/dL (ref 3.5–5.5)
ALK PHOS: 83 IU/L (ref 39–117)
ALT: 16 IU/L (ref 0–32)
AST: 17 IU/L (ref 0–40)
BILIRUBIN TOTAL: 0.3 mg/dL (ref 0.0–1.2)
BUN / CREAT RATIO: 20 (ref 9–23)
BUN: 17 mg/dL (ref 6–24)
CHLORIDE: 97 mmol/L (ref 96–106)
CO2: 24 mmol/L (ref 18–29)
Calcium: 9.8 mg/dL (ref 8.7–10.2)
Creatinine, Ser: 0.85 mg/dL (ref 0.57–1.00)
GFR calc non Af Amer: 75 mL/min/{1.73_m2} (ref >59)
GFR, EST AFRICAN AMERICAN: 87 mL/min/{1.73_m2} (ref >59)
GLOBULIN, TOTAL: 2.4 g/dL (ref 1.5–4.5)
Glucose: 123 mg/dL — ABNORMAL HIGH (ref 65–99)
POTASSIUM: 4.1 mmol/L (ref 3.5–5.2)
SODIUM: 139 mmol/L (ref 134–144)
TOTAL PROTEIN: 6.9 g/dL (ref 6.0–8.5)

## 2016-06-22 ENCOUNTER — Other Ambulatory Visit: Payer: Self-pay | Admitting: Unknown Physician Specialty

## 2016-07-21 ENCOUNTER — Other Ambulatory Visit: Payer: Self-pay | Admitting: Unknown Physician Specialty

## 2016-07-24 ENCOUNTER — Other Ambulatory Visit: Payer: Self-pay | Admitting: Unknown Physician Specialty

## 2016-07-28 ENCOUNTER — Other Ambulatory Visit: Payer: Self-pay | Admitting: Family Medicine

## 2016-07-28 NOTE — Telephone Encounter (Signed)
Last OV: 05/25/16 Next OV: 09/13/16  Lab Results  Component Value Date   CHOL 160 02/10/2016   HDL 52 02/10/2016   LDLCALC 64 02/10/2016   TRIG 222 (H) 02/10/2016   Lab Results  Component Value Date   CREATININE 0.85 06/01/2016   BUN 17 06/01/2016   NA 139 06/01/2016   K 4.1 06/01/2016   CL 97 06/01/2016   CO2 24 06/01/2016

## 2016-08-02 ENCOUNTER — Encounter: Payer: Self-pay | Admitting: Family Medicine

## 2016-08-02 ENCOUNTER — Ambulatory Visit (INDEPENDENT_AMBULATORY_CARE_PROVIDER_SITE_OTHER): Payer: Commercial Managed Care - PPO | Admitting: Family Medicine

## 2016-08-02 VITALS — BP 116/77 | HR 87 | Temp 97.8°F | Wt 221.5 lb

## 2016-08-02 DIAGNOSIS — N3001 Acute cystitis with hematuria: Secondary | ICD-10-CM | POA: Diagnosis not present

## 2016-08-02 DIAGNOSIS — R300 Dysuria: Secondary | ICD-10-CM | POA: Diagnosis not present

## 2016-08-02 DIAGNOSIS — G2581 Restless legs syndrome: Secondary | ICD-10-CM

## 2016-08-02 DIAGNOSIS — Z1239 Encounter for other screening for malignant neoplasm of breast: Secondary | ICD-10-CM

## 2016-08-02 DIAGNOSIS — Z1231 Encounter for screening mammogram for malignant neoplasm of breast: Secondary | ICD-10-CM | POA: Diagnosis not present

## 2016-08-02 MED ORDER — GABAPENTIN 100 MG PO CAPS: 100.0000 mg | ORAL_CAPSULE | Freq: Every day | ORAL | 3 refills | Status: DC

## 2016-08-02 MED ORDER — CIPROFLOXACIN HCL 500 MG PO TABS: 500.0000 mg | ORAL_TABLET | Freq: Two times a day (BID) | ORAL | 0 refills | Status: DC

## 2016-08-02 NOTE — Assessment & Plan Note (Signed)
Will check her ferritin as it has been a year. Will check Mag and phos. Start low dose gabapentin and follow up at regularly scheduled appointment. Call with any concerns.

## 2016-08-02 NOTE — Progress Notes (Signed)
BP 116/77 (BP Location: Left Arm, Patient Position: Sitting, Cuff Size: Normal)   Pulse 87   Temp 97.8 F (36.6 C)   Wt 221 lb 8 oz (100.5 kg)   LMP  (LMP Unknown)   SpO2 95%   BMI 37.20 kg/m    Subjective:    [name], female    DOB: 1956/02/15, 60 y.o.   MRN: [id number]  HPI: April Johnson is a 61 y.o. female  Chief Complaint  Patient presents with  . Urinary Tract Infection    Back pain and abdominal pain  . restless leg   URINARY SYMPTOMS- thinks she passed some stones today, hasn't had stones in the past Duration: several days Dysuria: burning Urinary frequency: yes Urgency: yes Small volume voids: yes Symptom severity: moderate Urinary incontinence: no Foul odor: yes Hematuria: yes Abdominal pain: yes Back pain: yes Suprapubic pain/pressure: yes Flank pain: yes Fever:  no Vomiting: no Relief with cranberry juice: no Relief with pyridium: no Status: better Previous urinary tract infection: no Recurrent urinary tract infection: no History of sexually transmitted disease: no Vaginal discharge: yes Treatments attempted: cranberry and increasing fluids   RESTLESS LEGS- had been on medication in the past, was on mirapex and requip in the past- both made her too sleepy and she didn't tolerate them  Duration: chronic Discomfort description:  Creeping and crawling Pain: yes Location: lower legs Bilateral: yes Symmetric: yes Severity: severe Onset:  gradual Frequency:  At bed time Symptoms only occur while legs at rest: yes Sudden unintentional leg jerking: yes Bed partner bothered by leg movements: no LE numbness: yes Decreased sensation: yes Weakness: no Insomnia: yes Daytime somnolence: no Fatigue: yes Alleviating factors: nothing Aggravating factors: nothing Status: worse Treatments attempted: requip, mirapex  Relevant past medical, surgical, family and social history reviewed and updated as indicated. Interim  medical history since our last visit reviewed. Allergies and medications reviewed and updated.  Review of Systems  Constitutional: Negative.   Respiratory: Negative.   Cardiovascular: Negative.   Genitourinary: Positive for dysuria, flank pain, frequency, hematuria and urgency. Negative for decreased urine volume, difficulty urinating, dyspareunia, enuresis, genital sores, menstrual problem, pelvic pain, vaginal bleeding, vaginal discharge and vaginal pain.  Musculoskeletal: Positive for back pain and myalgias. Negative for arthralgias, gait problem, joint swelling, neck pain and neck stiffness.  Neurological: Positive for tremors and numbness. Negative for dizziness, seizures, syncope, facial asymmetry, speech difficulty, weakness, light-headedness and headaches.  Hematological: Negative.   Psychiatric/Behavioral: Positive for sleep disturbance. Negative for agitation, behavioral problems, confusion, decreased concentration, dysphoric mood, hallucinations, self-injury and suicidal ideas. The patient is nervous/anxious. The patient is not hyperactive.     Per HPI unless specifically indicated above     Objective:    BP 116/77 (BP Location: Left Arm, Patient Position: Sitting, Cuff Size: Normal)   Pulse 87   Temp 97.8 F (36.6 C)   Wt 221 lb 8 oz (100.5 kg)   LMP  (LMP Unknown)   SpO2 95%   BMI 37.20 kg/m   Wt Readings from Last 3 Encounters:  08/02/16 221 lb 8 oz (100.5 kg)  06/01/16 220 lb 12.8 oz (100.2 kg)  02/10/16 223 lb 6.4 oz (101.3 kg)    Physical Exam  Constitutional: She is oriented to person, place, and time. She appears well-developed and well-nourished. No distress.  HENT:  Head: Normocephalic and atraumatic.  Right Ear: Hearing normal.  Left Ear: Hearing normal.  Nose: Nose normal.  Eyes: Conjunctivae and  lids are normal. Right eye exhibits no discharge. Left eye exhibits no discharge. No scleral icterus.  Cardiovascular: Normal rate, regular rhythm, normal  heart sounds and intact distal pulses.  Exam reveals no gallop and no friction rub.   No murmur heard. Pulmonary/Chest: Breath sounds normal. No respiratory distress. She has no wheezes. She has no rales. She exhibits no tenderness.  Abdominal: Soft. Bowel sounds are normal. She exhibits no distension and no mass. There is no tenderness. There is no rebound and no guarding.  Musculoskeletal: Normal range of motion. She exhibits no edema, tenderness or deformity.  Neurological: She is alert and oriented to person, place, and time.  Skin: Skin is warm, dry and intact. No rash noted. No erythema. No pallor.  Psychiatric: She has a normal mood and affect. Her speech is normal and behavior is normal. Judgment and thought content normal. Cognition and memory are normal.  Nursing note and vitals reviewed.   Results for orders placed or performed in visit on 06/01/16  Bayer DCA Hb A1c Waived  Result Value Ref Range   Bayer DCA Hb A1c Waived 7.7 (H) <7.0 %  Comprehensive metabolic panel  Result Value Ref Range   Glucose 123 (H) 65 - 99 mg/dL   BUN 17 6 - 24 mg/dL   Creatinine, Ser 0.85 0.57 - 1.00 mg/dL   GFR calc non Af Amer 75 >59 mL/min/1.73   GFR calc Af Amer 87 >59 mL/min/1.73   BUN/Creatinine Ratio 20 9 - 23   Sodium 139 134 - 144 mmol/L   Potassium 4.1 3.5 - 5.2 mmol/L   Chloride 97 96 - 106 mmol/L   CO2 24 18 - 29 mmol/L   Calcium 9.8 8.7 - 10.2 mg/dL   Total Protein 6.9 6.0 - 8.5 g/dL   Albumin 4.5 3.5 - 5.5 g/dL   Globulin, Total 2.4 1.5 - 4.5 g/dL   Albumin/Globulin Ratio 1.9 1.2 - 2.2   Bilirubin Total 0.3 0.0 - 1.2 mg/dL   Alkaline Phosphatase 83 39 - 117 IU/L   AST 17 0 - 40 IU/L   ALT 16 0 - 32 IU/L      Assessment & Plan:   Problem List Items Addressed This Visit      Other   RLS (restless legs syndrome)    Will check her ferritin as it has been a year. Will check Mag and phos. Start low dose gabapentin and follow up at regularly scheduled appointment. Call with any  concerns.       Relevant Orders   Ferritin   Magnesium   Phosphorus    Other Visit Diagnoses    Acute cystitis with hematuria    -  Primary   Will treat with cipro. Call if not getting better or getting worse.    Dysuria       +UTI- will treat   Relevant Orders   UA/M w/rflx Culture, Routine   Screening for breast cancer       Mammogram ordered today.       Follow up plan: Return As scheduled.

## 2016-08-03 LAB — PHOSPHORUS: Phosphorus: 3.9 mg/dL (ref 2.5–4.5)

## 2016-08-03 LAB — FERRITIN: Ferritin: 24 ng/mL (ref 15–150)

## 2016-08-03 LAB — MAGNESIUM: Magnesium: 2.2 mg/dL (ref 1.6–2.3)

## 2016-08-04 ENCOUNTER — Telehealth: Payer: Self-pay | Admitting: Family Medicine

## 2016-08-04 LAB — UA/M W/RFLX CULTURE, ROUTINE
BILIRUBIN UA: NEGATIVE
KETONES UA: NEGATIVE
Nitrite, UA: NEGATIVE
PROTEIN UA: NEGATIVE
Specific Gravity, UA: 1.01 (ref 1.005–1.030)
UUROB: 0.2 mg/dL (ref 0.2–1.0)
pH, UA: 5.5 (ref 5.0–7.5)

## 2016-08-04 LAB — URINE CULTURE, REFLEX

## 2016-08-04 LAB — MICROSCOPIC EXAMINATION

## 2016-08-04 MED ORDER — NITROFURANTOIN MONOHYD MACRO 100 MG PO CAPS: 100.0000 mg | ORAL_CAPSULE | Freq: Two times a day (BID) | ORAL | 0 refills | Status: DC

## 2016-08-04 NOTE — Telephone Encounter (Signed)
D/c cipro d/t side effects. Macrobid sent to pharmacy for her to start instead

## 2016-08-04 NOTE — Telephone Encounter (Signed)
Patient notified

## 2016-08-04 NOTE — Telephone Encounter (Signed)
Patient states er medication (ciprofloxacin-500 mg tablet) is making her sick on her stomach. Patient has been feeling dizzy and nousous and has stopped taking medication due to side affects.  Please Advise.  Thank you

## 2016-08-04 NOTE — Telephone Encounter (Signed)
Routing to provider for advice.

## 2016-08-08 ENCOUNTER — Telehealth: Payer: Self-pay | Admitting: Family Medicine

## 2016-08-08 ENCOUNTER — Other Ambulatory Visit: Payer: Self-pay | Admitting: Unknown Physician Specialty

## 2016-08-08 NOTE — Telephone Encounter (Signed)
Pt called and stated she was still sick on her stomach. She has no appetite and has lost 5 lbs since she was seen in the office last week and would like to know if she should come in again.

## 2016-08-08 NOTE — Telephone Encounter (Signed)
It looks like she should be seen

## 2016-08-08 NOTE — Telephone Encounter (Signed)
Called and scheduled patient an appointment for tomorrow with [name].

## 2016-08-09 ENCOUNTER — Ambulatory Visit: Payer: Self-pay | Admitting: Unknown Physician Specialty

## 2016-08-10 ENCOUNTER — Telehealth: Payer: Self-pay | Admitting: Unknown Physician Specialty

## 2016-08-10 NOTE — Telephone Encounter (Signed)
Discussed with pt about her appt yesterday.  She is doing better.

## 2016-08-14 ENCOUNTER — Other Ambulatory Visit: Payer: Self-pay | Admitting: Unknown Physician Specialty

## 2016-08-28 ENCOUNTER — Other Ambulatory Visit: Payer: Self-pay | Admitting: Unknown Physician Specialty

## 2016-08-29 NOTE — Telephone Encounter (Signed)
Routing to provider. Appt on 09/13/16.

## 2016-09-13 ENCOUNTER — Ambulatory Visit: Payer: Medicare Other | Admitting: Unknown Physician Specialty

## 2016-09-16 ENCOUNTER — Other Ambulatory Visit: Payer: Self-pay | Admitting: Unknown Physician Specialty

## 2016-09-20 ENCOUNTER — Ambulatory Visit: Payer: Medicare Other | Admitting: Unknown Physician Specialty

## 2016-09-27 ENCOUNTER — Ambulatory Visit (INDEPENDENT_AMBULATORY_CARE_PROVIDER_SITE_OTHER): Payer: Commercial Managed Care - PPO | Admitting: Unknown Physician Specialty

## 2016-09-27 ENCOUNTER — Encounter: Payer: Self-pay | Admitting: Unknown Physician Specialty

## 2016-09-27 VITALS — BP 128/79 | HR 66 | Temp 98.3°F | Wt 226.4 lb

## 2016-09-27 DIAGNOSIS — I10 Essential (primary) hypertension: Secondary | ICD-10-CM

## 2016-09-27 DIAGNOSIS — E1142 Type 2 diabetes mellitus with diabetic polyneuropathy: Secondary | ICD-10-CM

## 2016-09-27 DIAGNOSIS — R300 Dysuria: Secondary | ICD-10-CM

## 2016-09-27 DIAGNOSIS — R8299 Other abnormal findings in urine: Secondary | ICD-10-CM | POA: Diagnosis not present

## 2016-09-27 DIAGNOSIS — Z794 Long term (current) use of insulin: Secondary | ICD-10-CM

## 2016-09-27 DIAGNOSIS — E1122 Type 2 diabetes mellitus with diabetic chronic kidney disease: Secondary | ICD-10-CM | POA: Diagnosis not present

## 2016-09-27 DIAGNOSIS — N182 Chronic kidney disease, stage 2 (mild): Secondary | ICD-10-CM

## 2016-09-27 DIAGNOSIS — E782 Mixed hyperlipidemia: Secondary | ICD-10-CM

## 2016-09-27 DIAGNOSIS — R82998 Other abnormal findings in urine: Secondary | ICD-10-CM

## 2016-09-27 DIAGNOSIS — I129 Hypertensive chronic kidney disease with stage 1 through stage 4 chronic kidney disease, or unspecified chronic kidney disease: Secondary | ICD-10-CM

## 2016-09-27 DIAGNOSIS — F321 Major depressive disorder, single episode, moderate: Secondary | ICD-10-CM

## 2016-09-27 MED ORDER — PIOGLITAZONE HCL-METFORMIN HCL 15-500 MG PO TABS
1.0000 | ORAL_TABLET | Freq: Two times a day (BID) | ORAL | 3 refills | Status: DC
Start: 2016-09-27 — End: 2017-09-26

## 2016-09-27 MED ORDER — GABAPENTIN 300 MG PO CAPS: 300.0000 mg | ORAL_CAPSULE | Freq: Two times a day (BID) | ORAL | 5 refills | Status: DC

## 2016-09-27 MED ORDER — ATORVASTATIN CALCIUM 20 MG PO TABS: 20.0000 mg | ORAL_TABLET | Freq: Every day | ORAL | 0 refills | Status: DC

## 2016-09-27 MED ORDER — AMLODIPINE BESYLATE 5 MG PO TABS: 5.0000 mg | ORAL_TABLET | Freq: Every day | ORAL | 0 refills | Status: DC

## 2016-09-27 NOTE — Assessment & Plan Note (Addendum)
100 mg Gabapentin not working.  Increase Gabapentin to 300 mg twice a day prn

## 2016-09-27 NOTE — Assessment & Plan Note (Signed)
Pt does not want treatment at this time.  Discussed counseling

## 2016-09-27 NOTE — Progress Notes (Signed)
BP 128/79   Pulse 66   Temp 98.3 F (36.8 C)   Wt 226 lb 6.4 oz (102.7 kg)   LMP  (LMP Unknown)   SpO2 97%   BMI 38.03 kg/m    Subjective:    [name], female    DOB: 10-06-1955, 61 y.o.   MRN: [id number]  HPI: April Johnson is a 61 y.o. female  Chief Complaint  Patient presents with  . Diabetes  . Hyperlipidemia  . Hypertension   Diabetes: Using medications without difficulties.  60 units of Levimir No hypoglycemic episodes No hyperglycemic episodes Feet problems:Neuropathy in feet.  Up to 2 Gabapentin/day Blood Sugars averaging: 120 this AM eye exam within last year Last Hgb A1C: 7.4  Hypertension  Using medications without difficulty Average home BPs   Using medication without problems or lightheadedness No chest pain with exertion or shortness of breath No Edema  Elevated Cholesterol Using medications without problems No Muscle aches  Diet: cutting self off at 7p for 2 weeks Exercise: walking dog daily  Depression She thinks it's due to being at home most of the time.  She is worried about her sister whose husband died.   Depression screen PHQ 04-17-22 09/27/2016  Decreased Interest 1  Down, Depressed, Hopeless 1  PHQ - 2 Score 2  Altered sleeping 2  Tired, decreased energy 2  Change in appetite 2  Feeling bad or failure about yourself  0  Trouble concentrating 1  Moving slowly or fidgety/restless 0  Suicidal thoughts 0  PHQ-9 Score 9   Social History   Social History  . Marital status: Married    Spouse name: N/A  . Number of children: N/A  . Years of education: N/A   Occupational History  . Not on file.   Social History Main Topics  . Smoking status: Former Smoker    Packs/day: 0.25    Years: 10.00    Types: Cigarettes    Quit date: 03/08/1999  . Smokeless tobacco: Never Used  . Alcohol use No  . Drug use: No  . Sexual activity: No   Other Topics Concern  . Not on file   Social History Narrative  . No  narrative on file     Relevant past medical, surgical, family and social history reviewed and updated as indicated. Interim medical history since our last visit reviewed. Allergies and medications reviewed and updated.  Review of Systems  Per HPI unless specifically indicated above     Objective:    BP 128/79   Pulse 66   Temp 98.3 F (36.8 C)   Wt 226 lb 6.4 oz (102.7 kg)   LMP  (LMP Unknown)   SpO2 97%   BMI 38.03 kg/m   Wt Readings from Last 3 Encounters:  09/27/16 226 lb 6.4 oz (102.7 kg)  08/02/16 221 lb 8 oz (100.5 kg)  06/01/16 220 lb 12.8 oz (100.2 kg)    Physical Exam  Constitutional: She is oriented to person, place, and time. She appears well-developed and well-nourished. No distress.  HENT:  Head: Normocephalic and atraumatic.  Eyes: Conjunctivae and lids are normal. Right eye exhibits no discharge. Left eye exhibits no discharge. No scleral icterus.  Neck: Normal range of motion. Neck supple. No JVD present. Carotid bruit is not present.  Cardiovascular: Normal rate, regular rhythm and normal heart sounds.   Pulmonary/Chest: Effort normal and breath sounds normal.  Abdominal: Normal appearance. There is no splenomegaly or hepatomegaly.  Musculoskeletal: Normal range of motion.  Neurological: She is alert and oriented to person, place, and time.  Skin: Skin is warm, dry and intact. No rash noted. No pallor.  Psychiatric: She has a normal mood and affect. Her behavior is normal. Judgment and thought content normal.    Results for orders placed or performed in visit on 08/02/16  Microscopic Examination  Result Value Ref Range   WBC, UA 11-30 (A) 0 - 5 /hpf   RBC, UA 11-30 (A) 0 - 2 /hpf   Epithelial Cells (non renal) 0-10 0 - 10 /hpf   Renal Epithel, UA 0-10 (A) None seen /hpf   Bacteria, UA Moderate (A) None seen/Few  UA/M w/rflx Culture, Routine  Result Value Ref Range   Specific Gravity, UA 1.010 1.005 - 1.030   pH, UA 5.5 5.0 - 7.5   Color, UA  Yellow Yellow   Appearance Ur Clear Clear   Leukocytes, UA Trace (A) Negative   Protein, UA Negative Negative/Trace   Glucose, UA 3+ (A) Negative   Ketones, UA Negative Negative   RBC, UA 2+ (A) Negative   Bilirubin, UA Negative Negative   Urobilinogen, Ur 0.2 0.2 - 1.0 mg/dL   Nitrite, UA Negative Negative   Microscopic Examination See below:    Urinalysis Reflex Comment   Ferritin  Result Value Ref Range   Ferritin 24 15 - 150 ng/mL  Magnesium  Result Value Ref Range   Magnesium 2.2 1.6 - 2.3 mg/dL  Phosphorus  Result Value Ref Range   Phosphorus 3.9 2.5 - 4.5 mg/dL  Urine Culture, Routine  Result Value Ref Range   Urine Culture, Routine Final report    Organism ID, Bacteria Comment       Assessment & Plan:   Problem List Items Addressed This Visit      Unprioritized   Depression, major, single episode, moderate ([place])    Pt does not want treatment at this time.  Discussed counseling      Diabetes mellitus with renal manifestation (HCC) - Primary    Hgb A1C is 6.8.  Down from 7.4      Relevant Medications   pioglitazone-metformin (ACTOPLUS MET) 15-500 MG tablet   atorvastatin (LIPITOR) 20 MG tablet   Other Relevant Orders   Comprehensive metabolic panel   Bayer DCA Hb A1c Waived   Diabetic peripheral neuropathy (HCC)    100 mg Gabapentin not working.  Increase Gabapentin to 300 mg twice a day prn      Relevant Medications   pioglitazone-metformin (ACTOPLUS MET) 15-500 MG tablet   atorvastatin (LIPITOR) 20 MG tablet   gabapentin (NEURONTIN) 300 MG capsule   Hyperlipidemia    Check lipid panel      Relevant Medications   amLODipine (NORVASC) 5 MG tablet   atorvastatin (LIPITOR) 20 MG tablet   Other Relevant Orders   Lipid Panel w/o Chol/HDL Ratio   Hypertension    Stable, continue present medications.        Relevant Medications   amLODipine (NORVASC) 5 MG tablet   atorvastatin (LIPITOR) 20 MG tablet   Other Relevant Orders   Comprehensive  metabolic panel   Hypertensive chronic kidney disease   Relevant Orders   Comprehensive metabolic panel   Microalbumin, Urine Waived   Insulin long-term use ([place])    Continue present dose       Other Visit Diagnoses    Dysuria       Relevant Orders   UA/M w/rflx Culture, Routine  Follow up plan: Return in about 6 months (around 03/30/2017).

## 2016-09-27 NOTE — Assessment & Plan Note (Signed)
Hgb A1C is 6.8.  Down from 7.4

## 2016-09-27 NOTE — Assessment & Plan Note (Signed)
Stable, continue present medications.   

## 2016-09-27 NOTE — Assessment & Plan Note (Signed)
Check lipid panel  

## 2016-09-27 NOTE — Addendum Note (Signed)
Addended by: Kathrine Haddock on: 09/27/2016 04:42 PM   Modules accepted: Orders

## 2016-09-27 NOTE — Assessment & Plan Note (Signed)
Continue present dose.

## 2016-09-28 ENCOUNTER — Encounter: Payer: Self-pay | Admitting: Unknown Physician Specialty

## 2016-09-28 LAB — LIPID PANEL W/O CHOL/HDL RATIO
CHOLESTEROL TOTAL: 144 mg/dL (ref 100–199)
HDL: 49 mg/dL (ref >39)
LDL Calculated: 56 mg/dL (ref 0–99)
TRIGLYCERIDES: 197 mg/dL — AB (ref 0–149)
VLDL CHOLESTEROL CAL: 39 mg/dL (ref 5–40)

## 2016-09-28 LAB — COMPREHENSIVE METABOLIC PANEL
A/G RATIO: 2.1 (ref 1.2–2.2)
ALK PHOS: 80 IU/L (ref 39–117)
ALT: 15 IU/L (ref 0–32)
AST: 16 IU/L (ref 0–40)
Albumin: 4.6 g/dL (ref 3.5–5.5)
BILIRUBIN TOTAL: 0.5 mg/dL (ref 0.0–1.2)
BUN / CREAT RATIO: 19 (ref 9–23)
BUN: 16 mg/dL (ref 6–24)
CHLORIDE: 99 mmol/L (ref 96–106)
CO2: 24 mmol/L (ref 20–29)
Calcium: 10 mg/dL (ref 8.7–10.2)
Creatinine, Ser: 0.84 mg/dL (ref 0.57–1.00)
GFR calc Af Amer: 88 mL/min/{1.73_m2} (ref >59)
GFR calc non Af Amer: 76 mL/min/{1.73_m2} (ref >59)
GLUCOSE: 110 mg/dL — AB (ref 65–99)
Globulin, Total: 2.2 g/dL (ref 1.5–4.5)
POTASSIUM: 4.7 mmol/L (ref 3.5–5.2)
SODIUM: 141 mmol/L (ref 134–144)
TOTAL PROTEIN: 6.8 g/dL (ref 6.0–8.5)

## 2016-09-29 LAB — UA/M W/RFLX CULTURE, ROUTINE
Bilirubin, UA: NEGATIVE
KETONES UA: NEGATIVE
NITRITE UA: NEGATIVE
Protein, UA: NEGATIVE
RBC, UA: NEGATIVE
Specific Gravity, UA: 1.005 (ref 1.005–1.030)
Urobilinogen, Ur: 0.2 mg/dL (ref 0.2–1.0)
pH, UA: 5.5 (ref 5.0–7.5)

## 2016-09-29 LAB — MICROSCOPIC EXAMINATION
Bacteria, UA: NONE SEEN
CASTS: NONE SEEN /LPF
Cast Type: NONE SEEN
Crystal Type: NONE SEEN
Crystals: NONE SEEN
MUCUS UA: NONE SEEN
TRICHOMONAS UA: NONE SEEN

## 2016-09-29 LAB — MICROALBUMIN, URINE WAIVED
CREATININE, URINE WAIVED: 50 mg/dL (ref 10–300)
Microalb, Ur Waived: 10 mg/L (ref 0–19)
Microalb/Creat Ratio: <30 mg/g (ref <30)

## 2016-09-29 LAB — URINE CULTURE

## 2016-09-29 LAB — BAYER DCA HB A1C WAIVED: HB A1C (BAYER DCA - WAIVED): 6.8 % (ref <7.0)

## 2016-10-13 ENCOUNTER — Other Ambulatory Visit: Payer: Self-pay | Admitting: Unknown Physician Specialty

## 2016-10-17 ENCOUNTER — Other Ambulatory Visit: Payer: Self-pay | Admitting: Unknown Physician Specialty

## 2016-11-26 ENCOUNTER — Other Ambulatory Visit: Payer: Self-pay | Admitting: Unknown Physician Specialty

## 2016-11-27 NOTE — Telephone Encounter (Signed)
rx

## 2016-12-28 ENCOUNTER — Ambulatory Visit (INDEPENDENT_AMBULATORY_CARE_PROVIDER_SITE_OTHER): Payer: Commercial Managed Care - PPO

## 2016-12-28 DIAGNOSIS — Z23 Encounter for immunization: Secondary | ICD-10-CM | POA: Diagnosis not present

## 2017-01-04 ENCOUNTER — Other Ambulatory Visit: Payer: Self-pay | Admitting: Unknown Physician Specialty

## 2017-01-30 ENCOUNTER — Other Ambulatory Visit: Payer: Self-pay | Admitting: Unknown Physician Specialty

## 2017-01-31 ENCOUNTER — Other Ambulatory Visit: Payer: Self-pay | Admitting: Unknown Physician Specialty

## 2017-03-01 ENCOUNTER — Other Ambulatory Visit: Payer: Self-pay | Admitting: Unknown Physician Specialty

## 2017-03-03 ENCOUNTER — Telehealth: Payer: Self-pay | Admitting: Unknown Physician Specialty

## 2017-03-03 MED ORDER — CYCLOBENZAPRINE HCL 10 MG PO TABS: 10.0000 mg | ORAL_TABLET | Freq: Every day | ORAL | 2 refills | Status: DC

## 2017-03-03 NOTE — Telephone Encounter (Signed)
Patient notified

## 2017-03-03 NOTE — Telephone Encounter (Signed)
Routing to provider  

## 2017-03-03 NOTE — Telephone Encounter (Signed)
Copied from [place] [id number]. Topic: Quick Communication - Rx Refill/Question >> Mar 03, 2017  9:43 AM Scherrie Gerlach wrote: Has the patient contacted their pharmacy? no    pt has never had this Rx. Pt would like dr to prescribe flexeril.   Pt states her sister is prescribed this med, and she took one of hers.  Pt states this helped her sleep, took away the pain in her legs, feet and toes. Pt states she is taking the gabapentin (NEURONTIN) 300 MG capsule, but this does not help the pain like the flexeril. Pain wakes her up. This flexeril helped her get the best night sleep she has had in a long time!!  Advised pt she may need appt, but pt states she has seen cheryl wicker for a long time. Pt has appt 04/04/2017.   [id number] - [place], [place] - [address] [phone] (Phone) [phone] (Fax)  Pt would like a call back

## 2017-04-04 ENCOUNTER — Ambulatory Visit: Payer: Commercial Managed Care - PPO | Admitting: Unknown Physician Specialty

## 2017-04-04 ENCOUNTER — Encounter: Payer: Self-pay | Admitting: Unknown Physician Specialty

## 2017-04-04 VITALS — BP 112/74 | HR 68 | Temp 98.5°F | Ht 65.2 in | Wt 229.1 lb

## 2017-04-04 DIAGNOSIS — L308 Other specified dermatitis: Secondary | ICD-10-CM

## 2017-04-04 DIAGNOSIS — Z794 Long term (current) use of insulin: Secondary | ICD-10-CM

## 2017-04-04 DIAGNOSIS — L309 Dermatitis, unspecified: Secondary | ICD-10-CM | POA: Insufficient documentation

## 2017-04-04 DIAGNOSIS — R5383 Other fatigue: Secondary | ICD-10-CM

## 2017-04-04 DIAGNOSIS — E1122 Type 2 diabetes mellitus with diabetic chronic kidney disease: Secondary | ICD-10-CM | POA: Diagnosis not present

## 2017-04-04 DIAGNOSIS — N182 Chronic kidney disease, stage 2 (mild): Secondary | ICD-10-CM

## 2017-04-04 DIAGNOSIS — I129 Hypertensive chronic kidney disease with stage 1 through stage 4 chronic kidney disease, or unspecified chronic kidney disease: Secondary | ICD-10-CM

## 2017-04-04 LAB — BAYER DCA HB A1C WAIVED: HB A1C (BAYER DCA - WAIVED): 7.7 % — ABNORMAL HIGH (ref <7.0)

## 2017-04-04 MED ORDER — TRIAMCINOLONE ACETONIDE 0.1 % EX CREA: 1.0000 "application " | TOPICAL_CREAM | Freq: Two times a day (BID) | CUTANEOUS | 0 refills | Status: DC

## 2017-04-04 NOTE — Assessment & Plan Note (Addendum)
Kenalo cream for areas of dry skin

## 2017-04-04 NOTE — Assessment & Plan Note (Signed)
Stable, continue present medications.   

## 2017-04-04 NOTE — Progress Notes (Signed)
BP 112/74   Pulse 68   Temp 98.5 F (36.9 C) (Oral)   Ht 5' 5.2" (1.656 m)   Wt 229 lb 1.6 oz (103.9 kg)   LMP  (LMP Unknown)   SpO2 96%   BMI 37.89 kg/m    Subjective:    [name], female    DOB: Jan 28, 1956, 62 y.o.   MRN: [id number]  HPI: April Johnson is a 62 y.o. female  Chief Complaint  Patient presents with  . Depression  . Diabetes  . Hypertension  . Hyperlipidemia   Diabetes: Using medications without difficulties. Taking 60 units of Vevemir No hypoglycemic episodes No hyperglycemic episodes Feet problems: Bilateral neuropathy.  Takes up to 2 Gabapentin/day Blood Sugars averaging: 110-130 eye exam within last year Last Hgb A1C: 6.8  Hypertension  Using medications without difficulty Average home BPs   Using medication without problems or lightheadedness No chest pain with exertion or shortness of breath No Edema  Elevated Cholesterol Using medications without problems No Muscle aches  Diet:  Exercise:  Depression Doing well.  Sleep is a problem but more because she goes to bed late   Depression screen [facility] 2/9 04/04/2017 09/27/2016  Decreased Interest 0 1  Down, Depressed, Hopeless 0 1  PHQ - 2 Score 0 2  Altered sleeping 3 2  Tired, decreased energy 2 2  Change in appetite 2 2  Feeling bad or failure about yourself  0 0  Trouble concentrating 0 1  Moving slowly or fidgety/restless 0 0  Suicidal thoughts 0 0  PHQ-9 Score 7 9   Fatigue Sleep in a love seat as has trouble laying flat.  Snores at night.  Wakes up not feeling rested.  No headache.    Relevant past medical, surgical, family and social history reviewed and updated as indicated. Interim medical history since our last visit reviewed. Allergies and medications reviewed and updated.  Review of Systems  Constitutional: Negative.   HENT: Negative.   Eyes: Negative.   Respiratory: Negative.   Cardiovascular: Negative.   Gastrointestinal: Negative.     Endocrine: Negative.   Genitourinary: Negative.   Musculoskeletal: Negative.   Skin:       Was getting hives.  Stopped muscle relaxants and has done better  Allergic/Immunologic: Negative.   Neurological: Negative.   Hematological: Negative.   Psychiatric/Behavioral: Negative.     Per HPI unless specifically indicated above     Objective:    BP 112/74   Pulse 68   Temp 98.5 F (36.9 C) (Oral)   Ht 5' 5.2" (1.656 m)   Wt 229 lb 1.6 oz (103.9 kg)   LMP  (LMP Unknown)   SpO2 96%   BMI 37.89 kg/m   Wt Readings from Last 3 Encounters:  04/04/17 229 lb 1.6 oz (103.9 kg)  09/27/16 226 lb 6.4 oz (102.7 kg)  08/02/16 221 lb 8 oz (100.5 kg)    Physical Exam  Constitutional: She is oriented to person, place, and time. She appears well-developed and well-nourished. No distress.  HENT:  Head: Normocephalic and atraumatic.  Eyes: Conjunctivae and lids are normal. Right eye exhibits no discharge. Left eye exhibits no discharge. No scleral icterus.  Neck: Normal range of motion. Neck supple. No JVD present. Carotid bruit is not present.  Cardiovascular: Normal rate, regular rhythm and normal heart sounds.  Pulmonary/Chest: Effort normal and breath sounds normal.  Abdominal: Normal appearance. There is no splenomegaly or hepatomegaly.  Musculoskeletal: Normal range of  motion.  Neurological: She is alert and oriented to person, place, and time.  Skin: Skin is warm, dry and intact. No rash noted. No pallor.  Patches of dry scaley skin  Psychiatric: She has a normal mood and affect. Her behavior is normal. Judgment and thought content normal.      Assessment & Plan:   Problem List Items Addressed This Visit      Unprioritized   Diabetes mellitus with renal manifestation ([place]) - Primary    Not to goal.  Start Ozempic at .25mg .  First dose given in the office.  Recheck one month      Relevant Orders   Lipid Panel w/o Chol/HDL Ratio   Bayer DCA Hb A1c Waived   Comprehensive  metabolic panel   Eczema    Kenalo cream for areas of dry skin      Hypertensive chronic kidney disease    Stable, continue present medications.         Other Visit Diagnoses    Fatigue, unspecified type       Order sleep study.  Labs to f/o other conditions   Relevant Orders   Ambulatory referral to Sleep Studies   VITAMIN D 25 Hydroxy (Vit-D Deficiency, Fractures)   TSH   CBC with Differential/Platelet       Follow up plan: Return in about 4 weeks (around 05/02/2017). For blood sugar check and physical

## 2017-04-04 NOTE — Assessment & Plan Note (Signed)
Not to goal.  Start Ozempic at .25mg .  First dose given in the office.  Recheck one month

## 2017-04-05 ENCOUNTER — Encounter: Payer: Self-pay | Admitting: Unknown Physician Specialty

## 2017-04-05 LAB — COMPREHENSIVE METABOLIC PANEL
A/G RATIO: 1.8 (ref 1.2–2.2)
ALBUMIN: 4.4 g/dL (ref 3.6–4.8)
ALK PHOS: 91 IU/L (ref 39–117)
ALT: 16 IU/L (ref 0–32)
AST: 14 IU/L (ref 0–40)
BUN / CREAT RATIO: 17 (ref 12–28)
BUN: 18 mg/dL (ref 8–27)
Bilirubin Total: 0.4 mg/dL (ref 0.0–1.2)
CO2: 25 mmol/L (ref 20–29)
Calcium: 10 mg/dL (ref 8.7–10.3)
Chloride: 100 mmol/L (ref 96–106)
Creatinine, Ser: 1.05 mg/dL — ABNORMAL HIGH (ref 0.57–1.00)
GFR calc non Af Amer: 58 mL/min/{1.73_m2} — ABNORMAL LOW (ref >59)
GFR, EST AFRICAN AMERICAN: 67 mL/min/{1.73_m2} (ref >59)
GLOBULIN, TOTAL: 2.4 g/dL (ref 1.5–4.5)
Glucose: 124 mg/dL — ABNORMAL HIGH (ref 65–99)
Potassium: 4.5 mmol/L (ref 3.5–5.2)
SODIUM: 143 mmol/L (ref 134–144)
TOTAL PROTEIN: 6.8 g/dL (ref 6.0–8.5)

## 2017-04-05 LAB — LIPID PANEL W/O CHOL/HDL RATIO
CHOLESTEROL TOTAL: 133 mg/dL (ref 100–199)
HDL: 50 mg/dL (ref >39)
LDL Calculated: 39 mg/dL (ref 0–99)
TRIGLYCERIDES: 218 mg/dL — AB (ref 0–149)
VLDL Cholesterol Cal: 44 mg/dL — ABNORMAL HIGH (ref 5–40)

## 2017-04-05 LAB — TSH: TSH: 2.84 u[IU]/mL (ref 0.450–4.500)

## 2017-04-05 LAB — CBC WITH DIFFERENTIAL/PLATELET
Basophils Absolute: 0 10*3/uL (ref 0.0–0.2)
Basos: 0 %
EOS (ABSOLUTE): 0 10*3/uL (ref 0.0–0.4)
Eos: 1 %
Hematocrit: 38.9 % (ref 34.0–46.6)
Hemoglobin: 12.8 g/dL (ref 11.1–15.9)
Immature Grans (Abs): 0 10*3/uL (ref 0.0–0.1)
Immature Granulocytes: 0 %
Lymphocytes Absolute: 2.5 10*3/uL (ref 0.7–3.1)
Lymphs: 34 %
MCH: 26.7 pg (ref 26.6–33.0)
MCHC: 32.9 g/dL (ref 31.5–35.7)
MCV: 81 fL (ref 79–97)
Monocytes Absolute: 0.5 10*3/uL (ref 0.1–0.9)
Monocytes: 7 %
Neutrophils Absolute: 4.2 10*3/uL (ref 1.4–7.0)
Neutrophils: 58 %
Platelets: 346 10*3/uL (ref 150–379)
RBC: 4.79 x10E6/uL (ref 3.77–5.28)
RDW: 14.9 % (ref 12.3–15.4)
WBC: 7.3 10*3/uL (ref 3.4–10.8)

## 2017-04-05 LAB — VITAMIN D 25 HYDROXY (VIT D DEFICIENCY, FRACTURES): VIT D 25 HYDROXY: 30.8 ng/mL (ref 30.0–100.0)

## 2017-04-13 ENCOUNTER — Ambulatory Visit: Payer: Self-pay | Admitting: *Deleted

## 2017-04-13 NOTE — Telephone Encounter (Signed)
Pt called with because she was put on Ozempic on 04/04/17 and a second dose on 04/11/17; she says that she has been light headed, has no appettie and nausea, and episodes of feeling like she is going to faint when she goes outside; she states that she took her blood sugar last night and it was 137 so she did not take her insulin shot last night because she was afraid it would drop her blood sugar too much; pt also says her amlodopine has been changed to a new manufacturer and that may be the problem; pt also says her urine smells strong but no burning with urination; nurse triage initiated and recommendations made per protocol and pt to see physician within 24 hours; pt request appointment with April Johnson on Monday because her husband can bring her; explained to pt that this was outside the window recommended per nurse triage protocol; she verbalizes under standing and requests an appointment with April Johnson on Friday 04/14/17; pt offered and accepted appointment with April Johnson 04/14/17 at [place] ; she once again verbalizes understanding.   Reason for Disposition . Taking a medicine that could cause dizziness (e.g., blood pressure medications, diuretics)  Answer Assessment - Initial Assessment Questions 1. DESCRIPTION: "Describe your dizziness."     Faint feeling; everything is starting to spin 2. LIGHTHEADED: "Do you feel lightheaded?" (e.g., somewhat faint, woozy, weak upon standing)     Feels light headed when she stands up 3. VERTIGO: "Do you feel like either you or the room is spinning or tilting?" (i.e. vertigo)     no 4. SEVERITY: "How bad is it?"  "Do you feel like you are going to faint?" "Can you stand and walk?"   - MILD - walking normally   - MODERATE - interferes with normal activities (e.g., work, school)    - SEVERE - unable to stand, requires support to walk, feels like passing out now.      moderated 5. ONSET:  "When did the dizziness begin?"     04/04/17 6. AGGRAVATING FACTORS: "Does  anything make it worse?" (e.g., standing, change in head position)     Standing, going outside 7. HEART RATE: "Can you tell me your heart rate?" "How many beats in 15 seconds?"  (Note: not all patients can do this)       no 8. CAUSE: "What do you think is causing the dizziness?"   Maybe medicine 9. RECURRENT SYMPTOM: "Have you had dizziness before?" If so, ask: "When was the last time?" "What happened that time?"     Note like this 10. OTHER SYMPTOMS: "Do you have any other symptoms?" (e.g., fever, chest pain, vomiting, diarrhea, bleeding)       Nausea, no appetite; hard to eat  11. PREGNANCY: "Is there any chance you are pregnant?" "When was your last menstrual period?"       no  Protocols used: DIZZINESS [facility]

## 2017-04-14 ENCOUNTER — Encounter: Payer: Self-pay | Admitting: Unknown Physician Specialty

## 2017-04-14 ENCOUNTER — Ambulatory Visit (INDEPENDENT_AMBULATORY_CARE_PROVIDER_SITE_OTHER): Payer: Commercial Managed Care - PPO | Admitting: Unknown Physician Specialty

## 2017-04-14 VITALS — BP 121/82 | HR 89 | Temp 97.8°F | Wt 221.6 lb

## 2017-04-14 DIAGNOSIS — N182 Chronic kidney disease, stage 2 (mild): Secondary | ICD-10-CM

## 2017-04-14 DIAGNOSIS — N183 Chronic kidney disease, stage 3 unspecified: Secondary | ICD-10-CM

## 2017-04-14 DIAGNOSIS — R110 Nausea: Secondary | ICD-10-CM

## 2017-04-14 DIAGNOSIS — Z794 Long term (current) use of insulin: Secondary | ICD-10-CM | POA: Diagnosis not present

## 2017-04-14 DIAGNOSIS — R350 Frequency of micturition: Secondary | ICD-10-CM | POA: Diagnosis not present

## 2017-04-14 DIAGNOSIS — E1122 Type 2 diabetes mellitus with diabetic chronic kidney disease: Secondary | ICD-10-CM

## 2017-04-14 LAB — MICROSCOPIC EXAMINATION: Bacteria, UA: NONE SEEN

## 2017-04-14 LAB — UA/M W/RFLX CULTURE, ROUTINE
BILIRUBIN UA: NEGATIVE
Ketones, UA: NEGATIVE
Nitrite, UA: NEGATIVE
PH UA: 5 (ref 5.0–7.5)
PROTEIN UA: NEGATIVE
RBC UA: NEGATIVE
Specific Gravity, UA: 1.01 (ref 1.005–1.030)
Urobilinogen, Ur: 0.2 mg/dL (ref 0.2–1.0)

## 2017-04-14 LAB — GLUCOSE HEMOCUE WAIVED: Glu Hemocue Waived: 144 mg/dL — ABNORMAL HIGH (ref 65–99)

## 2017-04-14 MED ORDER — ONDANSETRON HCL 4 MG PO TABS: 4.0000 mg | ORAL_TABLET | Freq: Three times a day (TID) | ORAL | 0 refills | Status: DC | PRN

## 2017-04-14 NOTE — Assessment & Plan Note (Addendum)
Poor tolerance to Ozempic .5 mg.  Cut back to the .25 mg.  Cut back insulin to 45 units.  Written instructions given

## 2017-04-14 NOTE — Patient Instructions (Signed)
Cut back insulin to 45 u  Ozempic .25  Ondansetron 4 mg prn for nausea

## 2017-04-14 NOTE — Progress Notes (Signed)
BP 121/82   Pulse 89   Temp 97.8 F (36.6 C) (Oral)   Wt 221 lb 9.6 oz (100.5 kg)   LMP  (LMP Unknown)   SpO2 95%   BMI 36.65 kg/m    Subjective:    [name], female    DOB: 1956-02-03, 62 y.o.   MRN: [id number]  HPI: April Johnson is a 62 y.o. female  Chief Complaint  Patient presents with  . Nausea    pt states she has been having nausea and weakness ever since starting ozempic  . Urinary Tract Infection    pt states she has been having some urinary frequency and pressure   Diabetes Pt started her Ozempic and feeling nauseated and dizzy following her second dose of Ozempic.  Did OK after the 1st dose.  However, it seems that she took a .5 mg dose instead of the .25 mg.  States AM blood sugar has been around 130.  Not sure what it is now.      Dysuria Frequency and pressure not new.  Seems to have improved since stopping mountain dew.    Relevant past medical, surgical, family and social history reviewed and updated as indicated. Interim medical history since our last visit reviewed. Allergies and medications reviewed and updated.  Review of Systems  Per HPI unless specifically indicated above     Objective:    BP 121/82   Pulse 89   Temp 97.8 F (36.6 C) (Oral)   Wt 221 lb 9.6 oz (100.5 kg)   LMP  (LMP Unknown)   SpO2 95%   BMI 36.65 kg/m   Wt Readings from Last 3 Encounters:  04/14/17 221 lb 9.6 oz (100.5 kg)  04/04/17 229 lb 1.6 oz (103.9 kg)  09/27/16 226 lb 6.4 oz (102.7 kg)    Physical Exam  Constitutional: She is oriented to person, place, and time. She appears well-developed and well-nourished. No distress.  HENT:  Head: Normocephalic and atraumatic.  Eyes: Conjunctivae and lids are normal. Right eye exhibits no discharge. Left eye exhibits no discharge. No scleral icterus.  Neck: Normal range of motion. Neck supple. No JVD present. Carotid bruit is not present.  Cardiovascular: Normal rate, regular rhythm and  normal heart sounds.  Pulmonary/Chest: Effort normal and breath sounds normal.  Abdominal: Normal appearance. There is no splenomegaly or hepatomegaly.  Musculoskeletal: Normal range of motion.  Neurological: She is alert and oriented to person, place, and time.  Skin: Skin is warm, dry and intact. No rash noted. No pallor.  Psychiatric: She has a normal mood and affect. Her behavior is normal. Judgment and thought content normal.   Urine with trace Leukocytes Glucose is 144  Assessment & Plan:   Problem List Items Addressed This Visit      Unprioritized   CKD (chronic kidney disease) stage 3, GFR 30-59 ml/min (HCC)    Discussed GFR of 58.  Discuss fluids and NSAID avoidance      Diabetes mellitus with renal manifestation (HCC)    Poor tolerance to Ozempic .5 mg.  Cut back to the .25 mg.  Cut back insulin to 45 units.  Written instructions given      Relevant Orders   Glucose Hemocue Waived    Other Visit Diagnoses    Urinary frequency    -  Primary   Trace Leukocytes.  Send urine for culture   Relevant Orders   UA/M w/rflx Culture, Routine   Nausea  Side effect to Ozempic.  Rx for Zofran       Follow up plan: Return for at next office visit.

## 2017-04-14 NOTE — Assessment & Plan Note (Signed)
Discussed GFR of 58.  Discuss fluids and NSAID avoidance

## 2017-04-26 ENCOUNTER — Other Ambulatory Visit: Payer: Self-pay | Admitting: Unknown Physician Specialty

## 2017-04-27 ENCOUNTER — Other Ambulatory Visit: Payer: Self-pay | Admitting: Unknown Physician Specialty

## 2017-04-29 ENCOUNTER — Other Ambulatory Visit: Payer: Self-pay | Admitting: Unknown Physician Specialty

## 2017-05-09 ENCOUNTER — Other Ambulatory Visit: Payer: Self-pay | Admitting: Unknown Physician Specialty

## 2017-05-19 ENCOUNTER — Encounter: Payer: Commercial Managed Care - PPO | Admitting: Unknown Physician Specialty

## 2017-05-22 ENCOUNTER — Other Ambulatory Visit: Payer: Self-pay | Admitting: Unknown Physician Specialty

## 2017-06-05 ENCOUNTER — Telehealth: Payer: Self-pay

## 2017-06-05 NOTE — Telephone Encounter (Signed)
Copied from [place] [id number]. Topic: Medicare AWV >> Jun 05, 2017  3:23 PM Zephyra Bernardi, [place] A, LPN wrote: Reason for CRM: Called to schedule medicare annual wellness visit with [facility]- Henry Demeritt,LPN at [facility]. Please schedule anytime after 06/05/2017, prefer to have done prior to CPE on 06/13/2017 but can be scheduled after if patient cannot come before that.  Any questions please contact Joanette Gula on skype or by phone- [phone]

## 2017-06-13 ENCOUNTER — Encounter: Payer: Commercial Managed Care - PPO | Admitting: Unknown Physician Specialty

## 2017-06-14 ENCOUNTER — Other Ambulatory Visit: Payer: Self-pay | Admitting: Unknown Physician Specialty

## 2017-06-26 ENCOUNTER — Other Ambulatory Visit: Payer: Self-pay | Admitting: Unknown Physician Specialty

## 2017-06-28 LAB — HM DIABETES EYE EXAM

## 2017-07-12 ENCOUNTER — Other Ambulatory Visit: Payer: Self-pay | Admitting: Unknown Physician Specialty

## 2017-07-18 ENCOUNTER — Other Ambulatory Visit: Payer: Self-pay | Admitting: Family Medicine

## 2017-07-18 NOTE — Telephone Encounter (Signed)
Your patient 

## 2017-07-19 ENCOUNTER — Encounter: Payer: Self-pay | Admitting: Unknown Physician Specialty

## 2017-07-19 ENCOUNTER — Other Ambulatory Visit: Payer: Self-pay | Admitting: Unknown Physician Specialty

## 2017-07-19 ENCOUNTER — Ambulatory Visit: Payer: Commercial Managed Care - PPO | Admitting: Unknown Physician Specialty

## 2017-07-19 VITALS — BP 114/75 | HR 69 | Temp 98.0°F | Ht 65.0 in | Wt 222.8 lb

## 2017-07-19 DIAGNOSIS — I10 Essential (primary) hypertension: Secondary | ICD-10-CM

## 2017-07-19 DIAGNOSIS — Z0000 Encounter for general adult medical examination without abnormal findings: Secondary | ICD-10-CM | POA: Diagnosis not present

## 2017-07-19 DIAGNOSIS — E1169 Type 2 diabetes mellitus with other specified complication: Secondary | ICD-10-CM

## 2017-07-19 DIAGNOSIS — E782 Mixed hyperlipidemia: Secondary | ICD-10-CM | POA: Diagnosis not present

## 2017-07-19 DIAGNOSIS — G2581 Restless legs syndrome: Secondary | ICD-10-CM

## 2017-07-19 LAB — BAYER DCA HB A1C WAIVED: HB A1C: 7.2 % — AB (ref <7.0)

## 2017-07-19 NOTE — Patient Instructions (Signed)
Take .25 mg of Ozemipic for one week then increase to .5mg 

## 2017-07-19 NOTE — Assessment & Plan Note (Signed)
Stable, continue present medications.   

## 2017-07-19 NOTE — Progress Notes (Signed)
BP 114/75   Pulse 69   Temp 98 F (36.7 C) (Oral)   Ht 5\' 5"  (1.651 m)   Wt 222 lb 12.8 oz (101.1 kg)   LMP  (LMP Unknown)   SpO2 97%   BMI 37.08 kg/m    Subjective:    [name], female    DOB: Aug 16, 1955, 62 y.o.   MRN: [id number]  HPI: ARIYANNAH Johnson is a 62 y.o. female  Chief Complaint  Patient presents with  . Annual Exam  . Diabetes    pt states she has not had an eye exam yet this year  . Cyst    pt states she has a knot in the back of her head. States it is not painful, has been there for a while, and feels like it has moved.    Diabetes: Using medications without difficulties.  However, stopped the Ozempic as ran out of samples.  Felt it did well while taking.  Taking 60 units of Levimir.   No hypoglycemic episodes No hyperglycemic episodes Feet problems: Blood Sugars averaging:140s Last Hgb A1C: 7.7  Hypertension  Using medications without difficulty Average home BPs   Using medication without problems or lightheadedness No chest pain with exertion or shortness of breath No Edema  Elevated Cholesterol Using medications without problems No Muscle aches  Diet: Exercise:No recent changes  RLS Started Gabapentin QHS which helps a great deal  Social History   Socioeconomic History  . Marital status: Married    Spouse name: Not on file  . Number of children: Not on file  . Years of education: Not on file  . Highest education level: Not on file  Occupational History  . Not on file  Social Needs  . Financial resource strain: Not on file  . Food insecurity:    Worry: Not on file    Inability: Not on file  . Transportation needs:    Medical: Not on file    Non-medical: Not on file  Tobacco Use  . Smoking status: Former Smoker    Packs/day: 0.25    Years: 10.00    Pack years: 2.50    Types: Cigarettes    Last attempt to quit: 03/08/1999    Years since quitting: 18.3  . Smokeless tobacco: Never Used  Substance and  Sexual Activity  . Alcohol use: No    Alcohol/week: 0.0 oz  . Drug use: No  . Sexual activity: Never  Lifestyle  . Physical activity:    Days per week: Not on file    Minutes per session: Not on file  . Stress: Not on file  Relationships  . Social connections:    Talks on phone: Not on file    Gets together: Not on file    Attends religious service: Not on file    Active member of club or organization: Not on file    Attends meetings of clubs or organizations: Not on file    Relationship status: Not on file  . Intimate partner violence:    Fear of current or ex partner: Not on file    Emotionally abused: Not on file    Physically abused: Not on file    Forced sexual activity: Not on file  Other Topics Concern  . Not on file  Social History Narrative  . Not on file   Family History  Problem Relation Age of Onset  . Diabetes Sister   . Diabetes Brother   . Stroke  Paternal Grandmother   . Diabetes Sister   . Diabetes Sister     Past Medical History:  Diagnosis Date  . Acute bronchitis   . Allergy   . Arthritis   . Complication of anesthesia    difficult to wake up  . Diabetes mellitus without complication ([place])    type 2  . Diarrhea   . Esophagitis   . Excessive menstruation   . GERD (gastroesophageal reflux disease)   . Hyperlipidemia   . Hypertension    CONTROLLED ON MEDS  . Long term current use of insulin ([place])   . Neuropathy    left leg and feet  . Shortness of breath dyspnea    upon exertion  . Wears partial dentures    upper / does not wear   Past Surgical History:  Procedure Laterality Date  . CHOLECYSTECTOMY    . COLONOSCOPY WITH PROPOFOL N/A 01/12/2015   Procedure: COLONOSCOPY WITH PROPOFOL;  Surgeon: Lucilla Lame, MD;  Location: [facility];  Service: Endoscopy;  Laterality: N/A;  DIABETIC-INSULIN DEPENDENT  . POLYPECTOMY  01/12/2015   Procedure: POLYPECTOMY;  Surgeon: Lucilla Lame, MD;  Location: [facility];  Service:  Endoscopy;;  . WRIST SURGERY Right    ganglion cyst    Relevant past medical, surgical, family and social history reviewed and updated as indicated. Interim medical history since our last visit reviewed. Allergies and medications reviewed and updated.  Review of Systems  Constitutional: Negative.   HENT: Negative.   Respiratory: Negative.   Cardiovascular: Negative.   Gastrointestinal: Negative.   Genitourinary: Negative.   Musculoskeletal: Negative.   Skin:       Knot on skull as above   Neurological: Negative.   Psychiatric/Behavioral: Negative.     Per HPI unless specifically indicated above     Objective:    BP 114/75   Pulse 69   Temp 98 F (36.7 C) (Oral)   Ht 5\' 5"  (1.651 m)   Wt 222 lb 12.8 oz (101.1 kg)   LMP  (LMP Unknown)   SpO2 97%   BMI 37.08 kg/m   Wt Readings from Last 3 Encounters:  07/19/17 222 lb 12.8 oz (101.1 kg)  04/14/17 221 lb 9.6 oz (100.5 kg)  04/04/17 229 lb 1.6 oz (103.9 kg)    Physical Exam  Constitutional: She is oriented to person, place, and time. She appears well-developed and well-nourished.  HENT:  Head: Normocephalic and atraumatic.  Eyes: Pupils are equal, round, and reactive to light. Right eye exhibits no discharge. Left eye exhibits no discharge. No scleral icterus.  Neck: Normal range of motion. Neck supple. Carotid bruit is not present. No thyromegaly present.  Cardiovascular: Normal rate, regular rhythm and normal heart sounds. Exam reveals no gallop and no friction rub.  No murmur heard. Pulmonary/Chest: Effort normal and breath sounds normal. No respiratory distress. She has no wheezes. She has no rales. No breast tenderness or discharge.  Abdominal: Soft. Bowel sounds are normal. There is no tenderness. There is no rebound.  Genitourinary: Vagina normal and uterus normal. No breast tenderness or discharge. Cervix exhibits no motion tenderness, no discharge and no friability. Right adnexum displays no mass, no tenderness  and no fullness. Left adnexum displays no mass, no tenderness and no fullness.  Musculoskeletal: Normal range of motion.  Lymphadenopathy:    She has no cervical adenopathy.  Neurological: She is alert and oriented to person, place, and time.  Skin: Skin is warm, dry and intact. No rash  noted.  Knot on skyull  Psychiatric: She has a normal mood and affect. Her speech is normal and behavior is normal. Judgment and thought content normal. Cognition and memory are normal.    Results for orders placed or performed in visit on 04/14/17  Microscopic Examination  Result Value Ref Range   WBC, UA 0-5 0 - 5 /hpf   RBC, UA 0-2 0 - 2 /hpf   Epithelial Cells (non renal) 0-10 0 - 10 /hpf   Renal Epithel, UA 0-10 (A) None seen /hpf   Bacteria, UA None seen None seen/Few   Yeast, UA Present None seen  UA/M w/rflx Culture, Routine  Result Value Ref Range   Specific Gravity, UA 1.010 1.005 - 1.030   pH, UA 5.0 5.0 - 7.5   Color, UA Straw Yellow   Appearance Ur Hazy (A) Clear   Leukocytes, UA Trace (A) Negative   Protein, UA Negative Negative/Trace   Glucose, UA 3+ (A) Negative   Ketones, UA Negative Negative   RBC, UA Negative Negative   Bilirubin, UA Negative Negative   Urobilinogen, Ur 0.2 0.2 - 1.0 mg/dL   Nitrite, UA Negative Negative   Microscopic Examination See below:   Glucose Hemocue Waived  Result Value Ref Range   Glu Hemocue Waived 144 (H) 65 - 99 mg/dL      Assessment & Plan:   Problem List Items Addressed This Visit      Unprioritized   Diabetes mellitus ([place]) - Primary    Hgb A1C is 7.2%.  Restart the Ozempic at .25mg  for 1 week then take .5mg        Relevant Orders   Bayer DCA Hb A1c Waived   Lipid Panel w/o Chol/HDL Ratio   Hyperlipidemia    Stable, continue present medications.        Hypertension    Stable, continue present medications.        RLS (restless legs syndrome)    Stable on Gabapentin, continue present medications.         Other Visit  Diagnoses    Benign hypertension       Relevant Orders   CBC with Differential/Platelet   Comprehensive metabolic panel   Routine general medical examination at a health care facility       Relevant Orders   CBC with Differential/Platelet   TSH   IGP, Aptima HPV, rfx 16/18,45   MM Digital Diagnostic Bilat   Annual physical exam           Follow up plan: Return in about 3 months (around 10/19/2017).

## 2017-07-19 NOTE — Assessment & Plan Note (Signed)
Hgb A1C is 7.2%.  Restart the Ozempic at .25mg  for 1 week then take .5mg 

## 2017-07-19 NOTE — Assessment & Plan Note (Signed)
Stable on Gabapentin, continue present medications.

## 2017-07-20 ENCOUNTER — Other Ambulatory Visit: Payer: Self-pay | Admitting: Unknown Physician Specialty

## 2017-07-20 ENCOUNTER — Encounter: Payer: Self-pay | Admitting: Unknown Physician Specialty

## 2017-07-20 LAB — COMPREHENSIVE METABOLIC PANEL
A/G RATIO: 1.8 (ref 1.2–2.2)
ALT: 12 IU/L (ref 0–32)
AST: 14 IU/L (ref 0–40)
Albumin: 4.5 g/dL (ref 3.6–4.8)
Alkaline Phosphatase: 89 IU/L (ref 39–117)
BILIRUBIN TOTAL: 0.5 mg/dL (ref 0.0–1.2)
BUN / CREAT RATIO: 27 (ref 12–28)
BUN: 26 mg/dL (ref 8–27)
CALCIUM: 10.3 mg/dL (ref 8.7–10.3)
CO2: 27 mmol/L (ref 20–29)
Chloride: 96 mmol/L (ref 96–106)
Creatinine, Ser: 0.97 mg/dL (ref 0.57–1.00)
GFR, EST AFRICAN AMERICAN: 73 mL/min/{1.73_m2} (ref >59)
GFR, EST NON AFRICAN AMERICAN: 64 mL/min/{1.73_m2} (ref >59)
GLUCOSE: 168 mg/dL — AB (ref 65–99)
Globulin, Total: 2.5 g/dL (ref 1.5–4.5)
POTASSIUM: 4.2 mmol/L (ref 3.5–5.2)
SODIUM: 138 mmol/L (ref 134–144)
Total Protein: 7 g/dL (ref 6.0–8.5)

## 2017-07-20 LAB — LIPID PANEL W/O CHOL/HDL RATIO
Cholesterol, Total: 160 mg/dL (ref 100–199)
HDL: 47 mg/dL (ref >39)
LDL Calculated: 65 mg/dL (ref 0–99)
Triglycerides: 241 mg/dL — ABNORMAL HIGH (ref 0–149)
VLDL Cholesterol Cal: 48 mg/dL — ABNORMAL HIGH (ref 5–40)

## 2017-07-20 LAB — CBC WITH DIFFERENTIAL/PLATELET
BASOS ABS: 0 10*3/uL (ref 0.0–0.2)
BASOS: 0 %
EOS (ABSOLUTE): 0.1 10*3/uL (ref 0.0–0.4)
Eos: 1 %
Hematocrit: 38.6 % (ref 34.0–46.6)
Hemoglobin: 13.1 g/dL (ref 11.1–15.9)
IMMATURE GRANULOCYTES: 0 %
Immature Grans (Abs): 0 10*3/uL (ref 0.0–0.1)
LYMPHS: 31 %
Lymphocytes Absolute: 2.7 10*3/uL (ref 0.7–3.1)
MCH: 27.1 pg (ref 26.6–33.0)
MCHC: 33.9 g/dL (ref 31.5–35.7)
MCV: 80 fL (ref 79–97)
MONOS ABS: 0.6 10*3/uL (ref 0.1–0.9)
Monocytes: 7 %
NEUTROS PCT: 61 %
Neutrophils Absolute: 5.3 10*3/uL (ref 1.4–7.0)
PLATELETS: 352 10*3/uL (ref 150–379)
RBC: 4.83 x10E6/uL (ref 3.77–5.28)
RDW: 15.2 % (ref 12.3–15.4)
WBC: 8.7 10*3/uL (ref 3.4–10.8)

## 2017-07-20 LAB — TSH: TSH: 2.23 u[IU]/mL (ref 0.450–4.500)

## 2017-07-21 LAB — IGP, APTIMA HPV, RFX 16/18,45
HPV APTIMA: NEGATIVE
PAP Smear Comment: 0

## 2017-07-24 ENCOUNTER — Telehealth: Payer: Self-pay | Admitting: Unknown Physician Specialty

## 2017-07-24 MED ORDER — FLUCONAZOLE 150 MG PO TABS: 150.0000 mg | ORAL_TABLET | Freq: Once | ORAL | 0 refills | Status: AC

## 2017-07-24 NOTE — Telephone Encounter (Signed)
Please advise.     Copied from [place] [phone]. Topic: General - Other >> Jul 24, 2017  1:59 PM [name] wrote:  Pt said she has a yeast infection and is asking if something can be called in  [place]

## 2017-07-25 ENCOUNTER — Other Ambulatory Visit: Payer: Self-pay | Admitting: Unknown Physician Specialty

## 2017-07-26 NOTE — Telephone Encounter (Signed)
Pt calling checking on refill status she only has 2 left.

## 2017-07-29 ENCOUNTER — Other Ambulatory Visit: Payer: Self-pay | Admitting: Unknown Physician Specialty

## 2017-08-02 NOTE — Telephone Encounter (Signed)
atorvastatin refill Last Refill:04/27/17 # 90 tabs No RF Last OV: 07/19/17 PCP: Kathrine Haddock NP [facility] [address]

## 2017-08-13 ENCOUNTER — Other Ambulatory Visit: Payer: Self-pay | Admitting: Family Medicine

## 2017-09-22 ENCOUNTER — Encounter: Payer: Self-pay | Admitting: Unknown Physician Specialty

## 2017-09-26 ENCOUNTER — Other Ambulatory Visit: Payer: Self-pay | Admitting: Unknown Physician Specialty

## 2017-09-27 LAB — HM DIABETES EYE EXAM

## 2017-10-12 ENCOUNTER — Telehealth: Payer: Self-pay | Admitting: Physician Assistant

## 2017-10-12 DIAGNOSIS — Z794 Long term (current) use of insulin: Secondary | ICD-10-CM

## 2017-10-12 DIAGNOSIS — E1142 Type 2 diabetes mellitus with diabetic polyneuropathy: Secondary | ICD-10-CM

## 2017-10-12 DIAGNOSIS — N182 Chronic kidney disease, stage 2 (mild): Secondary | ICD-10-CM

## 2017-10-12 DIAGNOSIS — E1122 Type 2 diabetes mellitus with diabetic chronic kidney disease: Secondary | ICD-10-CM

## 2017-10-12 NOTE — Telephone Encounter (Signed)
Copied from [place] [id number]. Topic: General - Other >> Oct 12, 2017 10:32 AM Cecelia Byars, NT wrote: April Johnson Reason for CRM Patient called and would like a prescription for ozempic ,she was given samples at her last visit  to lower her A1c she is running out , she also has a yeast infection under her breast and would like something for this ,she is unsure what she is prescribed for it in past and she states that it  worked really well please call her at [phone]

## 2017-10-13 MED ORDER — SEMAGLUTIDE(0.25 OR 0.5MG/DOS) 2 MG/1.5ML ~~LOC~~ SOPN: 0.5000 mg | PEN_INJECTOR | SUBCUTANEOUS | 1 refills | Status: DC

## 2017-10-13 NOTE — Telephone Encounter (Signed)
Sent in ozempic. Do not see mention of yeast infection in past, will evaluate at clinic visit or she may make earlier appointment.

## 2017-10-13 NOTE — Telephone Encounter (Signed)
Message relayed to patient. Verbalized understanding and denied questions.   

## 2017-10-16 ENCOUNTER — Other Ambulatory Visit: Payer: Self-pay | Admitting: Unknown Physician Specialty

## 2017-10-24 ENCOUNTER — Encounter: Payer: Self-pay | Admitting: Physician Assistant

## 2017-10-24 ENCOUNTER — Other Ambulatory Visit: Payer: Self-pay

## 2017-10-24 ENCOUNTER — Other Ambulatory Visit: Payer: Self-pay | Admitting: Unknown Physician Specialty

## 2017-10-24 ENCOUNTER — Ambulatory Visit: Payer: Commercial Managed Care - PPO | Admitting: Unknown Physician Specialty

## 2017-10-24 ENCOUNTER — Ambulatory Visit: Payer: Commercial Managed Care - PPO | Admitting: Physician Assistant

## 2017-10-24 ENCOUNTER — Other Ambulatory Visit: Payer: Self-pay | Admitting: Physician Assistant

## 2017-10-24 VITALS — BP 112/76 | HR 73 | Temp 97.5°F | Ht 65.0 in | Wt 218.1 lb

## 2017-10-24 DIAGNOSIS — Z794 Long term (current) use of insulin: Secondary | ICD-10-CM

## 2017-10-24 DIAGNOSIS — Z114 Encounter for screening for human immunodeficiency virus [HIV]: Secondary | ICD-10-CM

## 2017-10-24 DIAGNOSIS — Z1159 Encounter for screening for other viral diseases: Secondary | ICD-10-CM | POA: Diagnosis not present

## 2017-10-24 DIAGNOSIS — Z23 Encounter for immunization: Secondary | ICD-10-CM | POA: Diagnosis not present

## 2017-10-24 DIAGNOSIS — N182 Chronic kidney disease, stage 2 (mild): Principal | ICD-10-CM

## 2017-10-24 DIAGNOSIS — E1122 Type 2 diabetes mellitus with diabetic chronic kidney disease: Secondary | ICD-10-CM

## 2017-10-24 DIAGNOSIS — B372 Candidiasis of skin and nail: Secondary | ICD-10-CM

## 2017-10-24 LAB — BAYER DCA HB A1C WAIVED: HB A1C (BAYER DCA - WAIVED): 6.7 % (ref <7.0)

## 2017-10-24 MED ORDER — NYSTATIN 100000 UNIT/GM EX POWD: Freq: Four times a day (QID) | CUTANEOUS | 0 refills | Status: DC

## 2017-10-24 MED ORDER — SEMAGLUTIDE(0.25 OR 0.5MG/DOS) 2 MG/1.5ML ~~LOC~~ SOPN: 0.7500 mg | PEN_INJECTOR | SUBCUTANEOUS | 1 refills | Status: DC

## 2017-10-24 NOTE — Patient Instructions (Signed)
Increase Ozemic to 0.75 mg weekly, which will be a combination of 0.25 mg and 0.5mg  injections once weekly.  If your fasting sugars become 70 or lower or else you are symptomatic, please reduce levemir by 2 units every 3 days until fastings are 100-110's.

## 2017-10-24 NOTE — Progress Notes (Signed)
Subjective:    [name], female    DOB: June 11, 1955, 62 y.o.   MRN: [id number]  April Johnson is a 62 y.o. female presenting on 10/24/2017 for Diabetes (pt states having yeast infection x 1 week) and Hypertension   HPI   DMII: Levemir 40 units QHS. Invokana 300 mg QD. Ozempic 0.5 mg subQ weekly. Sugars running 137 in the morning, down to 114 sometimes in the morning. A1c is 6.7%.    HLD  Lipid Panel     Component Value Date/Time   CHOL 160 07/19/2017 0940   CHOL 153 07/20/2015 0912   TRIG 241 (H) 07/19/2017 0940   TRIG 245 (H) 07/20/2015 0912   HDL 47 07/19/2017 0940   VLDL 49 (H) 07/20/2015 0912   LDLCALC 65 07/19/2017 0940   HTN  BP Readings from Last 3 Encounters:  10/24/17 112/76  07/19/17 114/75  04/14/17 121/82   BP Readings from Last 3 Encounters:  10/24/17 112/76  07/19/17 114/75  04/14/17 121/82      Social History   Tobacco Use  . Smoking status: Former Smoker    Packs/day: 0.25    Years: 10.00    Pack years: 2.50    Types: Cigarettes    Last attempt to quit: 03/08/1999    Years since quitting: 18.6  . Smokeless tobacco: Never Used  Substance Use Topics  . Alcohol use: No    Alcohol/week: 0.0 standard drinks  . Drug use: No    Review of Systems Per HPI unless specifically indicated above     Objective:    BP 112/76   Pulse 73   Temp (!) 97.5 F (36.4 C) (Oral)   Ht 5\' 5"  (1.651 m)   Wt 218 lb 1.6 oz (98.9 kg)   LMP  (LMP Unknown)   SpO2 98%   BMI 36.29 kg/m   Wt Readings from Last 3 Encounters:  10/24/17 218 lb 1.6 oz (98.9 kg)  07/19/17 222 lb 12.8 oz (101.1 kg)  04/14/17 221 lb 9.6 oz (100.5 kg)    Physical Exam  Constitutional: She is oriented to person, place, and time. She appears well-developed and well-nourished.  Cardiovascular: Normal rate and regular rhythm.  Pulmonary/Chest: Effort normal and breath sounds normal.  Neurological: She is alert and oriented to person, place, and time.    Skin: Skin is warm and dry. Rash noted. There is erythema.  Erythematous papules under breasts and in inguinal folds.   Psychiatric: She has a normal mood and affect. Her behavior is normal.   Results for orders placed or performed in visit on 10/24/17  Bayer DCA Hb A1c Waived (STAT)  Result Value Ref Range   HB A1C (BAYER DCA - WAIVED) 6.7 <7.0 %      Assessment & Plan:  1. Type 2 diabetes mellitus with stage 2 chronic kidney disease, with long-term current use of insulin ([place])   Under very good control today. She wants to come off insulin and so will increase Ozempic to 0.75 mg subQ weekly. If her fastings drop below 70/80, she can decrease Levemir by 2 units every 3 days until fastings are 100's.  - Bayer DCA Hb A1c Waived (STAT) - Semaglutide (OZEMPIC) 0.25 or 0.5 MG/DOSE SOPN; Inject 0.75 mg into the skin once a week.  Dispense: 3 mL; Refill: 1  3. Encounter for screening for HIV  - HIV antibody (with reflex)  4. Encounter for hepatitis C screening test for low risk patient  -  Hepatitis C antibody  5. Flu vaccine need  - Flu Vaccine QUAD 36+ mos IM  6. Candida infection of flexural skin  - nystatin (MYCOSTATIN/NYSTOP) powder; Apply topically 4 (four) times daily.  Dispense: 15 g; Refill: 0    Follow up plan: Return in about 3 months (around 01/24/2018) for DM, HTN, HLD.  Carles Collet, PA-C [facility] 10/24/2017, 5:03 PM

## 2017-10-24 NOTE — Telephone Encounter (Signed)
Invokana refill Last Refill:07/26/17 # 90; no refills Last OV: 07/19/17 PCP: Terrilee Croak [facility] [place] [place]  *has a 3 mo. F/u appt. today at 10:45 AM

## 2017-10-25 LAB — HIV ANTIBODY (ROUTINE TESTING W REFLEX): HIV Screen 4th Generation wRfx: NONREACTIVE

## 2017-10-25 LAB — HEPATITIS C ANTIBODY: Hep C Virus Ab: <0.1 s/co ratio (ref 0.0–0.9)

## 2017-10-30 ENCOUNTER — Telehealth: Payer: Self-pay | Admitting: Unknown Physician Specialty

## 2017-10-30 DIAGNOSIS — B372 Candidiasis of skin and nail: Secondary | ICD-10-CM

## 2017-10-30 DIAGNOSIS — R110 Nausea: Secondary | ICD-10-CM

## 2017-10-30 MED ORDER — ONDANSETRON HCL 4 MG PO TABS: 4.0000 mg | ORAL_TABLET | Freq: Three times a day (TID) | ORAL | 0 refills | Status: DC | PRN

## 2017-10-30 MED ORDER — NYSTATIN 100000 UNIT/GM EX POWD: Freq: Four times a day (QID) | CUTANEOUS | 0 refills | Status: DC

## 2017-10-30 NOTE — Telephone Encounter (Signed)
Done for both

## 2017-10-30 NOTE — Telephone Encounter (Signed)
Copied from [place] [id number]. Topic: Quick Communication - Rx Refill/Question >> Oct 30, 2017  9:50 AM Scherrie Gerlach wrote: Medication: ondansetron (ZOFRAN) 4 MG tablet  Pt seen adrianna on 8/20 and states the increase of Semaglutide (OZEMPIC) 0.25 or 0.5 MG/DOSE SOPN is causing her nausea.  Pt states this med was prescribed in the past and it helps with her nausea.  Pt also requesting refill of nystatin (MYCOSTATIN/NYSTOP) powder The bottle she got from pharmacy was tiny and she states that is just not enough for all the body areas she has yeast infection [id number] [place], [place] - [address] [phone] (Phone) [phone] (Fax)

## 2017-10-31 NOTE — Telephone Encounter (Signed)
Patient notified that medications were sent in for her.

## 2017-11-07 NOTE — Telephone Encounter (Signed)
Patient was given a sample pen of Ozempic and I think this should get her through until she can titrate up to the 1 mg weekly subcutaneous dose. Just FYI no action necessary.

## 2017-11-13 ENCOUNTER — Other Ambulatory Visit: Payer: Self-pay | Admitting: Unknown Physician Specialty

## 2017-12-08 ENCOUNTER — Telehealth: Payer: Self-pay | Admitting: Unknown Physician Specialty

## 2017-12-08 ENCOUNTER — Ambulatory Visit: Payer: Self-pay | Admitting: Unknown Physician Specialty

## 2017-12-08 NOTE — Telephone Encounter (Signed)
Copied from [place] [id number]. Topic: Quick Communication - See Telephone Encounter >> Dec 08, 2017  8:29 AM Hewitt Shorts wrote: Pt is showing signs of a possible uti and was hoping that someone can call in something for her   [place] number [phone]

## 2017-12-08 NOTE — Telephone Encounter (Signed)
Attempted to return patient call. Left VM to call the office

## 2017-12-08 NOTE — Telephone Encounter (Signed)
Pt c/o 3 day history of urinary frequency, odor, burning and bladder pressure. Pt having flank pain. Pt denies fever, blood in urine.  Care advice given and pt verbalized understanding. No available appointments at PCP office. Pt advised to go to [facility]. Pt stated that she will go.   Reason for Disposition . Side (flank) or lower back pain present . Urinating more frequently than usual (i.e., frequency) . Bad or foul-smelling urine  Answer Assessment - Initial Assessment Questions 1. SYMPTOM: "What's the main symptom you're concerned about?" (e.g., frequency, incontinence)     Frequency, odor, burning, feels pressure  2. ONSET: "When did the  Symptoms   start?"     3 days ago 3. PAIN: "Is there any pain?" If so, ask: "How bad is it?" (Scale: 1-10; mild, moderate, severe)     No but pressure 4. CAUSE: "What do you think is causing the symptoms?"    UTI 5. OTHER SYMPTOMS: "Do you have any other symptoms?" (e.g., fever, flank pain, blood in urine, pain with urination)    Flank pain, burning with urination 6. PREGNANCY: "Is there any chance you are pregnant?" "When was your last menstrual period?"     n/a  Protocols used: URINARY [facility]

## 2017-12-08 NOTE — Telephone Encounter (Signed)
Pt triaged and sent to [facility].

## 2017-12-08 NOTE — Telephone Encounter (Signed)
Patient called and stated her symptoms are urinary frequency, pressure, small burning sensation and she has a slight odor

## 2017-12-08 NOTE — Telephone Encounter (Signed)
Attempted to return call to pt.  Left vm to return call to the office to discuss symptoms.

## 2017-12-09 ENCOUNTER — Other Ambulatory Visit: Payer: Self-pay | Admitting: Physician Assistant

## 2017-12-09 DIAGNOSIS — E1122 Type 2 diabetes mellitus with diabetic chronic kidney disease: Secondary | ICD-10-CM

## 2017-12-09 DIAGNOSIS — N182 Chronic kidney disease, stage 2 (mild): Principal | ICD-10-CM

## 2017-12-09 DIAGNOSIS — Z794 Long term (current) use of insulin: Principal | ICD-10-CM

## 2017-12-11 NOTE — Telephone Encounter (Signed)
Cheryl patient that I had seen in clinic.

## 2017-12-14 ENCOUNTER — Other Ambulatory Visit: Payer: Self-pay | Admitting: Unknown Physician Specialty

## 2017-12-14 DIAGNOSIS — B372 Candidiasis of skin and nail: Secondary | ICD-10-CM

## 2017-12-14 MED ORDER — NYSTATIN 100000 UNIT/GM EX POWD: Freq: Four times a day (QID) | CUTANEOUS | 0 refills | Status: DC

## 2017-12-14 NOTE — Telephone Encounter (Signed)
Copied from [place] [id number]. Topic: Quick Communication - See Telephone Encounter >> Dec 14, 2017 11:59 AM [name] wrote: CRM for notification. See Telephone encounter for: 12/14/17.  Patient states that she needs another refill on her nystatin (MYCOSTATIN/NYSTOP) powder. She said that this bottle is only 10 ounces and it is not enough for her yeast that is up under her breast and her stomach. Please advise.  [id number] [place] [place] - [address] [place] [address]

## 2017-12-14 NOTE — Telephone Encounter (Signed)
Requested medication (s) are due for refill today: yes  Requested medication (s) are on the active medication list: yes  Last refill:  10/30/17  Future visit scheduled: yes  Notes to clinic:  undelegated    Requested Prescriptions  Pending Prescriptions Disp Refills   nystatin (MYCOSTATIN/NYSTOP) powder 15 g 0    Sig: Apply topically 4 (four) times daily.     Off-Protocol Failed - 12/14/2017 12:12 PM      Failed - Medication not assigned to a protocol, review manually.      Passed - Valid encounter within last 12 months    Recent Outpatient Visits          1 month ago Type 2 diabetes mellitus with stage 2 chronic kidney disease, with long-term current use of insulin ([place])   [place], Adriana M, PA-C   4 months ago Type 2 diabetes mellitus with other specified complication, without long-term current use of insulin ([place])   [place] Kathrine Haddock, NP   8 months ago Urinary frequency   [facility] Kathrine Haddock, NP   8 months ago Type 2 diabetes mellitus with stage 2 chronic kidney disease, with long-term current use of insulin ([place])   [facility] Kathrine Haddock, NP   1 year ago Type 2 diabetes mellitus with stage 2 chronic kidney disease, with long-term current use of insulin ([place])   [place] Kathrine Haddock, NP      Future Appointments            In 1 month Cannady, [name], NP [facility]

## 2017-12-14 NOTE — Telephone Encounter (Signed)
May have refill for Nystatin.  Sent.

## 2018-01-10 ENCOUNTER — Other Ambulatory Visit: Payer: Self-pay | Admitting: Unknown Physician Specialty

## 2018-01-10 NOTE — Telephone Encounter (Signed)
Requested Prescriptions  Pending Prescriptions Disp Refills  . metoprolol succinate (TOPROL-XL) 100 MG 24 hr tablet [Pharmacy Med Name: METOPROLOL SUCC ER 100 MG TAB] 90 tablet 0    Sig: TAKE 1 TABLET BY MOUTH DAILY WITH OR IMMEDIATELY FOLLOWING A MEAL.     Cardiovascular:  Beta Blockers Passed - 01/10/2018  1:59 AM      Passed - Last BP in normal range    BP Readings from Last 1 Encounters:  10/24/17 112/76         Passed - Last Heart Rate in normal range    Pulse Readings from Last 1 Encounters:  10/24/17 73         Passed - Valid encounter within last 6 months    Recent Outpatient Visits          2 months ago Type 2 diabetes mellitus with stage 2 chronic kidney disease, with long-term current use of insulin ([place])   [place], Adriana M, PA-C   5 months ago Type 2 diabetes mellitus with other specified complication, without long-term current use of insulin ([place])   [facility] Kathrine Haddock, NP   9 months ago Urinary frequency   [facility] Kathrine Haddock, NP   9 months ago Type 2 diabetes mellitus with stage 2 chronic kidney disease, with long-term current use of insulin ([place])   [facility] Kathrine Haddock, NP   1 year ago Type 2 diabetes mellitus with stage 2 chronic kidney disease, with long-term current use of insulin ([place])   [place] Kathrine Haddock, NP      Future Appointments            In 2 weeks Cannady, [name], NP [facility]

## 2018-01-14 ENCOUNTER — Other Ambulatory Visit: Payer: Self-pay | Admitting: Physician Assistant

## 2018-01-16 NOTE — Telephone Encounter (Signed)
Refill request approved.  Next appt 01/24/18

## 2018-01-24 ENCOUNTER — Ambulatory Visit: Payer: Commercial Managed Care - PPO | Admitting: Nurse Practitioner

## 2018-01-24 ENCOUNTER — Encounter: Payer: Self-pay | Admitting: Nurse Practitioner

## 2018-01-24 ENCOUNTER — Other Ambulatory Visit: Payer: Self-pay

## 2018-01-24 ENCOUNTER — Other Ambulatory Visit: Payer: Self-pay | Admitting: Family Medicine

## 2018-01-24 VITALS — BP 123/78 | HR 71 | Temp 98.1°F | Ht 65.0 in | Wt 211.0 lb

## 2018-01-24 DIAGNOSIS — E1122 Type 2 diabetes mellitus with diabetic chronic kidney disease: Secondary | ICD-10-CM

## 2018-01-24 DIAGNOSIS — R110 Nausea: Secondary | ICD-10-CM

## 2018-01-24 DIAGNOSIS — I10 Essential (primary) hypertension: Secondary | ICD-10-CM | POA: Diagnosis not present

## 2018-01-24 DIAGNOSIS — N182 Chronic kidney disease, stage 2 (mild): Secondary | ICD-10-CM

## 2018-01-24 DIAGNOSIS — K209 Esophagitis, unspecified without bleeding: Secondary | ICD-10-CM

## 2018-01-24 DIAGNOSIS — E782 Mixed hyperlipidemia: Secondary | ICD-10-CM

## 2018-01-24 DIAGNOSIS — Z794 Long term (current) use of insulin: Secondary | ICD-10-CM

## 2018-01-24 DIAGNOSIS — B372 Candidiasis of skin and nail: Secondary | ICD-10-CM

## 2018-01-24 DIAGNOSIS — E1142 Type 2 diabetes mellitus with diabetic polyneuropathy: Secondary | ICD-10-CM

## 2018-01-24 LAB — BAYER DCA HB A1C WAIVED: HB A1C: 5.9 % (ref <7.0)

## 2018-01-24 MED ORDER — ATORVASTATIN CALCIUM 20 MG PO TABS: 20.0000 mg | ORAL_TABLET | Freq: Every day | ORAL | 3 refills | Status: DC

## 2018-01-24 MED ORDER — NYSTATIN 100000 UNIT/GM EX POWD: Freq: Four times a day (QID) | CUTANEOUS | 1 refills | Status: DC

## 2018-01-24 MED ORDER — METOPROLOL SUCCINATE ER 100 MG PO TB24: ORAL_TABLET | ORAL | 3 refills | Status: DC

## 2018-01-24 MED ORDER — AMLODIPINE BESYLATE 5 MG PO TABS: 5.0000 mg | ORAL_TABLET | Freq: Every day | ORAL | 3 refills | Status: DC

## 2018-01-24 MED ORDER — ONDANSETRON HCL 4 MG PO TABS: 4.0000 mg | ORAL_TABLET | Freq: Three times a day (TID) | ORAL | 1 refills | Status: DC | PRN

## 2018-01-24 NOTE — Assessment & Plan Note (Addendum)
Chronic, stable.  Continue current regimen.  Mag level today.

## 2018-01-24 NOTE — Assessment & Plan Note (Addendum)
Chronic, stable.  Continue daily statin.  Lipid panel today.

## 2018-01-24 NOTE — Patient Instructions (Signed)
Diabetes Mellitus and Nutrition When you have diabetes (diabetes mellitus), it is very important to have healthy eating habits because your blood sugar (glucose) levels are greatly affected by what you eat and drink. Eating healthy foods in the appropriate amounts, at about the same times every day, can help you:  Control your blood glucose.  Lower your risk of heart disease.  Improve your blood pressure.  Reach or maintain a healthy weight.  Every person with diabetes is different, and each person has different needs for a meal plan. Your health care provider may recommend that you work with a diet and nutrition specialist (dietitian) to make a meal plan that is best for you. Your meal plan may vary depending on factors such as:  The calories you need.  The medicines you take.  Your weight.  Your blood glucose, blood pressure, and cholesterol levels.  Your activity level.  Other health conditions you have, such as heart or kidney disease.  How do carbohydrates affect me? Carbohydrates affect your blood glucose level more than any other type of food. Eating carbohydrates naturally increases the amount of glucose in your blood. Carbohydrate counting is a method for keeping track of how many carbohydrates you eat. Counting carbohydrates is important to keep your blood glucose at a healthy level, especially if you use insulin or take certain oral diabetes medicines. It is important to know how many carbohydrates you can safely have in each meal. This is different for every person. Your dietitian can help you calculate how many carbohydrates you should have at each meal and for snack. Foods that contain carbohydrates include:  Bread, cereal, rice, pasta, and crackers.  Potatoes and corn.  Peas, beans, and lentils.  Milk and yogurt.  Fruit and juice.  Desserts, such as cakes, cookies, ice cream, and candy.  How does alcohol affect me? Alcohol can cause a sudden decrease in blood  glucose (hypoglycemia), especially if you use insulin or take certain oral diabetes medicines. Hypoglycemia can be a life-threatening condition. Symptoms of hypoglycemia (sleepiness, dizziness, and confusion) are similar to symptoms of having too much alcohol. If your health care provider says that alcohol is safe for you, follow these guidelines:  Limit alcohol intake to no more than 1 drink per day for nonpregnant women and 2 drinks per day for men. One drink equals 12 oz of beer, 5 oz of wine, or 1 oz of hard liquor.  Do not drink on an empty stomach.  Keep yourself hydrated with water, diet soda, or unsweetened iced tea.  Keep in mind that regular soda, juice, and other mixers may contain a lot of sugar and must be counted as carbohydrates.  What are tips for following this plan? Reading food labels  Start by checking the serving size on the label. The amount of calories, carbohydrates, fats, and other nutrients listed on the label are based on one serving of the food. Many foods contain more than one serving per package.  Check the total grams (g) of carbohydrates in one serving. You can calculate the number of servings of carbohydrates in one serving by dividing the total carbohydrates by 15. For example, if a food has 30 g of total carbohydrates, it would be equal to 2 servings of carbohydrates.  Check the number of grams (g) of saturated and trans fats in one serving. Choose foods that have low or no amount of these fats.  Check the number of milligrams (mg) of sodium in one serving. Most people   should limit total sodium intake to less than 2,300 mg per day.  Always check the nutrition information of foods labeled as "low-fat" or "nonfat". These foods may be higher in added sugar or refined carbohydrates and should be avoided.  Talk to your dietitian to identify your daily goals for nutrients listed on the label. Shopping  Avoid buying canned, premade, or processed foods. These  foods tend to be high in fat, sodium, and added sugar.  Shop around the outside edge of the grocery store. This includes fresh fruits and vegetables, bulk grains, fresh meats, and fresh dairy. Cooking  Use low-heat cooking methods, such as baking, instead of high-heat cooking methods like deep frying.  Cook using healthy oils, such as olive, canola, or sunflower oil.  Avoid cooking with butter, cream, or high-fat meats. Meal planning  Eat meals and snacks regularly, preferably at the same times every day. Avoid going long periods of time without eating.  Eat foods high in fiber, such as fresh fruits, vegetables, beans, and whole grains. Talk to your dietitian about how many servings of carbohydrates you can eat at each meal.  Eat 4-6 ounces of lean protein each day, such as lean meat, chicken, fish, eggs, or tofu. 1 ounce is equal to 1 ounce of meat, chicken, or fish, 1 egg, or 1/4 cup of tofu.  Eat some foods each day that contain healthy fats, such as avocado, nuts, seeds, and fish. Lifestyle   Check your blood glucose regularly.  Exercise at least 30 minutes 5 or more days each week, or as told by your health care provider.  Take medicines as told by your health care provider.  Do not use any products that contain nicotine or tobacco, such as cigarettes and e-cigarettes. If you need help quitting, ask your health care provider.  Work with a counselor or diabetes educator to identify strategies to manage stress and any emotional and social challenges. What are some questions to ask my health care provider?  Do I need to meet with a diabetes educator?  Do I need to meet with a dietitian?  What number can I call if I have questions?  When are the best times to check my blood glucose? Where to find more information:  American Diabetes Association: diabetes.org/food-and-fitness/food  Academy of Nutrition and Dietetics:  www.eatright.org/resources/health/diseases-and-conditions/diabetes  National Institute of Diabetes and Digestive and Kidney Diseases (NIH): www.niddk.nih.gov/health-information/diabetes/overview/diet-eating-physical-activity Summary  A healthy meal plan will help you control your blood glucose and maintain a healthy lifestyle.  Working with a diet and nutrition specialist (dietitian) can help you make a meal plan that is best for you.  Keep in mind that carbohydrates and alcohol have immediate effects on your blood glucose levels. It is important to count carbohydrates and to use alcohol carefully. This information is not intended to replace advice given to you by your health care provider. Make sure you discuss any questions you have with your health care provider. Document Released: 11/18/2004 Document Revised: 03/28/2016 Document Reviewed: 03/28/2016 Elsevier Interactive Patient Education  2018 [employer]  

## 2018-01-24 NOTE — Assessment & Plan Note (Addendum)
Chronic, ongoing.  Last A1C 6.7% and Ozempic was increased to 0.75MG  weekly with instructions to decrease Levemir dose Q3DAYS based on FSBS in morning, which she has not been doing.  She has stated wishes to get off insulin.  A1C today 5.9%, reiterated to her to check FSBS every morning, if fastings drop below 90, she can decrease Levemir by 2 units every 3 days until fastings are 100's.  Discussed importance of decreasing Levemir dose, as do not want hypoglycemic episodes.  She was able to verbalize back to provider POC.

## 2018-01-24 NOTE — Progress Notes (Signed)
BP 123/78   Pulse 71   Temp 98.1 F (36.7 C) (Oral)   Ht 5\' 5"  (1.651 m)   Wt 211 lb (95.7 kg)   LMP  (LMP Unknown)   SpO2 98%   BMI 35.11 kg/m    Subjective:    [name], female    DOB: 09-14-1955, 62 y.o.   MRN: [id number]  HPI: April Johnson is a 62 y.o. female presents for follow-up T2DM and HTN/HLD  Chief Complaint  Patient presents with  . Diabetes    31m f/u  . Hypertension  . Hyperlipidemia   DIABETES WITH NEUROPATHY Ozempic increased at last visit to 0.75 MG weekly, as patient stated wishes to come off insulin.  She continues on Levemir 40 units daily, along with Invokana.  Was instructed during last visit to decrease Levemir dose, but has not been doing this.  A1C in August was 6.7%.  She reports satisfaction with current regimen and weight loss of over 20 pounds.  States she is not eating as much.  Pain well-controlled with Gabapentin. Hypoglycemic episodes:no, denies any episodes of hypoglycemia or symptoms Polydipsia/polyuria: no Visual disturbance: no Chest pain: no Paresthesias: no Glucose Monitoring: yes  Accucheck frequency: does not check every day, checks through week and if feels bad; but not regularly checking: encouraged her to check daily with insulin use  Fasting glucose: 110-140  Post prandial:  Evening:  Before meals: Taking Insulin?: yes  Long acting insulin:  Short acting insulin: Blood Pressure Monitoring: not checking Retinal Examination: Up to Date Foot Exam: Up to Date Diabetic Education: Completed Pneumovax: Up to Date Influenza: Up to Date Aspirin: no   HYPERTENSION / HYPERLIPIDEMIA Reports satisfaction with current BP and cholesterol regimen, with no ADR. Satisfied with current treatment? yes Duration of hypertension: chronic BP monitoring frequency: not checking BP range:  BP medication side effects: no Past BP meds: none Duration of hyperlipidemia: chronic Cholesterol medication side  effects: no Cholesterol supplements: none Past cholesterol medications: none Medication compliance: excellent compliance Aspirin: yes Recent stressors: no Recurrent headaches: no Visual changes: no Palpitations: no Dyspnea: no Chest pain: no Lower extremity edema: no Dizzy/lightheaded: no   ESOPHAGITIS: Well-controlled with daily Omeprazole.  Denies heart burn or N&V.  Relevant past medical, surgical, family and social history reviewed and updated as indicated. Interim medical history since our last visit reviewed. Allergies and medications reviewed and updated.  Review of Systems  Constitutional: Negative for activity change, appetite change, diaphoresis, fatigue and fever.  Respiratory: Negative for cough, chest tightness, shortness of breath and wheezing.   Cardiovascular: Negative for chest pain, palpitations and leg swelling.  Gastrointestinal: Negative for abdominal distention, abdominal pain, constipation, diarrhea, nausea and vomiting.  Endocrine: Negative for cold intolerance, heat intolerance, polydipsia, polyphagia and polyuria.  Neurological: Negative for dizziness, tremors, speech difficulty, weakness, light-headedness, numbness and headaches.  Psychiatric/Behavioral: Negative for behavioral problems, decreased concentration and sleep disturbance. The patient is not nervous/anxious.     Per HPI unless specifically indicated above     Objective:    BP 123/78   Pulse 71   Temp 98.1 F (36.7 C) (Oral)   Ht 5\' 5"  (1.651 m)   Wt 211 lb (95.7 kg)   LMP  (LMP Unknown)   SpO2 98%   BMI 35.11 kg/m   Wt Readings from Last 3 Encounters:  01/24/18 211 lb (95.7 kg)  10/24/17 218 lb 1.6 oz (98.9 kg)  07/19/17 222 lb 12.8 oz (101.1 kg)  Physical Exam  Constitutional: She is oriented to person, place, and time. She appears well-developed and well-nourished.  HENT:  Head: Normocephalic.  Eyes: Pupils are equal, round, and reactive to light. Conjunctivae and EOM are  normal. Right eye exhibits no discharge. Left eye exhibits no discharge.  Neck: Normal range of motion. Neck supple. No JVD present. Carotid bruit is not present. No thyromegaly present.  Cardiovascular: Normal rate, regular rhythm and normal heart sounds.  Pulmonary/Chest: Effort normal and breath sounds normal.  Abdominal: Soft. Bowel sounds are normal.  Obese abdomen  Musculoskeletal: Normal range of motion.  Lymphadenopathy:    She has no cervical adenopathy.  Neurological: She is alert and oriented to person, place, and time.  Skin: Skin is warm and dry.  Psychiatric: She has a normal mood and affect. Her behavior is normal. Judgment and thought content normal.  Nursing note and vitals reviewed.   Results for orders placed or performed in visit on 01/24/18  Bayer DCA Hb A1c Waived  Result Value Ref Range   HB A1C (BAYER DCA - WAIVED) 5.9 <7.0 %      Assessment & Plan:   Problem List Items Addressed This Visit      Cardiovascular and Mediastinum   Hypertension    Chronic, stable.  Continue current regimen.  CMP today.      Relevant Medications   amLODipine (NORVASC) 5 MG tablet   atorvastatin (LIPITOR) 20 MG tablet   metoprolol succinate (TOPROL-XL) 100 MG 24 hr tablet   Other Relevant Orders   Comprehensive metabolic panel     Digestive   Esophagitis    Chronic, stable.  Continue current regimen.  Mag level today.      Relevant Orders   Magnesium     Endocrine   Diabetes mellitus (HCC)    Chronic, ongoing.  Last A1C 6.7% and Ozempic was increased to 0.75MG  weekly with instructions to decrease Levemir dose Q3DAYS based on FSBS in morning, which she has not been doing.  She has stated wishes to get off insulin.  A1C today 5.9%, reiterated to her to check FSBS every morning, if fastings drop below 90, she can decrease Levemir by 2 units every 3 days until fastings are 100's.  Discussed importance of decreasing Levemir dose, as do not want hypoglycemic episodes.  She  was able to verbalize back to provider POC.      Relevant Medications   atorvastatin (LIPITOR) 20 MG tablet   Other Relevant Orders   Bayer DCA Hb A1c Waived (Completed)   Diabetic peripheral neuropathy (HCC)    Chronic, well-controlled with Gabapentin.      Relevant Medications   atorvastatin (LIPITOR) 20 MG tablet     Other   Hyperlipidemia    Chronic, stable.  Continue daily statin.  Lipid panel today.      Relevant Medications   amLODipine (NORVASC) 5 MG tablet   atorvastatin (LIPITOR) 20 MG tablet   metoprolol succinate (TOPROL-XL) 100 MG 24 hr tablet   Other Relevant Orders   Lipid Profile    Other Visit Diagnoses    Candida infection of flexural skin       Relevant Medications   nystatin (MYCOSTATIN/NYSTOP) powder   Nausea           Follow up plan: Return in about 3 months (around 04/26/2018) for T2DM, HTN/HLD.

## 2018-01-24 NOTE — Assessment & Plan Note (Signed)
Chronic, well-controlled with Gabapentin.

## 2018-01-24 NOTE — Assessment & Plan Note (Addendum)
Chronic, stable.  Continue current regimen.  CMP today.

## 2018-01-25 LAB — LIPID PANEL
CHOL/HDL RATIO: 3.2 ratio (ref 0.0–4.4)
Cholesterol, Total: 155 mg/dL (ref 100–199)
HDL: 49 mg/dL (ref >39)
LDL CALC: 63 mg/dL (ref 0–99)
Triglycerides: 213 mg/dL — ABNORMAL HIGH (ref 0–149)
VLDL Cholesterol Cal: 43 mg/dL — ABNORMAL HIGH (ref 5–40)

## 2018-01-25 LAB — COMPREHENSIVE METABOLIC PANEL
ALBUMIN: 4.5 g/dL (ref 3.6–4.8)
ALT: 13 IU/L (ref 0–32)
AST: 11 IU/L (ref 0–40)
Albumin/Globulin Ratio: 2 (ref 1.2–2.2)
Alkaline Phosphatase: 84 IU/L (ref 39–117)
BUN / CREAT RATIO: 22 (ref 12–28)
BUN: 21 mg/dL (ref 8–27)
Bilirubin Total: 0.5 mg/dL (ref 0.0–1.2)
CALCIUM: 10.1 mg/dL (ref 8.7–10.3)
CO2: 27 mmol/L (ref 20–29)
CREATININE: 0.97 mg/dL (ref 0.57–1.00)
Chloride: 97 mmol/L (ref 96–106)
GFR calc Af Amer: 73 mL/min/{1.73_m2} (ref >59)
GFR, EST NON AFRICAN AMERICAN: 63 mL/min/{1.73_m2} (ref >59)
GLOBULIN, TOTAL: 2.2 g/dL (ref 1.5–4.5)
Glucose: 89 mg/dL (ref 65–99)
Potassium: 4.2 mmol/L (ref 3.5–5.2)
SODIUM: 141 mmol/L (ref 134–144)
Total Protein: 6.7 g/dL (ref 6.0–8.5)

## 2018-01-25 LAB — MAGNESIUM: Magnesium: 2.3 mg/dL (ref 1.6–2.3)

## 2018-01-25 NOTE — Progress Notes (Signed)
Normal test results noted.  Please call patient and make them aware of normal results and will continue to monitor at regular visits.  Have a great day

## 2018-01-29 ENCOUNTER — Telehealth: Payer: Self-pay | Admitting: Nurse Practitioner

## 2018-01-29 NOTE — Telephone Encounter (Signed)
Called an spoke with patient as follow-up to recent visit to ensure she is decreasing her insulin dose as has been recommended, she did not do this with prior instruction.  At this time she reports she is decreasing it, currently taking 40 units every other day.  Had advised her to decrease by 2-3 units a day based on morning blood sugars, if they are below 90-100 she is to decreased dose.  Reiterated this recommendation to her and she stated she would do this.  Also she notified provider that [facility] sent both her husband and her a letter stating they were no longer going to cover [place].  Discussed with her that provider would look into this and any assistance available to assist with medication, as at this time she has good control and is reducing daily insulin intake + losing wait + showing improvement in A1C.  Will follow-up with patient.

## 2018-02-05 ENCOUNTER — Other Ambulatory Visit: Payer: Self-pay

## 2018-02-05 ENCOUNTER — Encounter: Payer: Self-pay | Admitting: Emergency Medicine

## 2018-02-05 DIAGNOSIS — K5900 Constipation, unspecified: Secondary | ICD-10-CM | POA: Insufficient documentation

## 2018-02-05 DIAGNOSIS — Z794 Long term (current) use of insulin: Secondary | ICD-10-CM | POA: Insufficient documentation

## 2018-02-05 DIAGNOSIS — L7211 Pilar cyst: Secondary | ICD-10-CM | POA: Insufficient documentation

## 2018-02-05 DIAGNOSIS — R55 Syncope and collapse: Secondary | ICD-10-CM | POA: Diagnosis not present

## 2018-02-05 DIAGNOSIS — W19XXXA Unspecified fall, initial encounter: Secondary | ICD-10-CM | POA: Insufficient documentation

## 2018-02-05 DIAGNOSIS — E1140 Type 2 diabetes mellitus with diabetic neuropathy, unspecified: Secondary | ICD-10-CM | POA: Insufficient documentation

## 2018-02-05 DIAGNOSIS — Z79899 Other long term (current) drug therapy: Secondary | ICD-10-CM | POA: Insufficient documentation

## 2018-02-05 DIAGNOSIS — S20211A Contusion of right front wall of thorax, initial encounter: Secondary | ICD-10-CM | POA: Diagnosis not present

## 2018-02-05 DIAGNOSIS — Y9389 Activity, other specified: Secondary | ICD-10-CM | POA: Insufficient documentation

## 2018-02-05 DIAGNOSIS — Z87891 Personal history of nicotine dependence: Secondary | ICD-10-CM | POA: Diagnosis not present

## 2018-02-05 DIAGNOSIS — E1122 Type 2 diabetes mellitus with diabetic chronic kidney disease: Secondary | ICD-10-CM | POA: Diagnosis not present

## 2018-02-05 DIAGNOSIS — N3001 Acute cystitis with hematuria: Secondary | ICD-10-CM | POA: Diagnosis not present

## 2018-02-05 DIAGNOSIS — S299XXA Unspecified injury of thorax, initial encounter: Secondary | ICD-10-CM | POA: Diagnosis present

## 2018-02-05 DIAGNOSIS — Y998 Other external cause status: Secondary | ICD-10-CM | POA: Insufficient documentation

## 2018-02-05 DIAGNOSIS — Y92012 Bathroom of single-family (private) house as the place of occurrence of the external cause: Secondary | ICD-10-CM | POA: Insufficient documentation

## 2018-02-05 DIAGNOSIS — Z7982 Long term (current) use of aspirin: Secondary | ICD-10-CM | POA: Insufficient documentation

## 2018-02-05 DIAGNOSIS — I129 Hypertensive chronic kidney disease with stage 1 through stage 4 chronic kidney disease, or unspecified chronic kidney disease: Secondary | ICD-10-CM | POA: Insufficient documentation

## 2018-02-05 DIAGNOSIS — N183 Chronic kidney disease, stage 3 (moderate): Secondary | ICD-10-CM | POA: Diagnosis not present

## 2018-02-05 LAB — BASIC METABOLIC PANEL
Anion gap: 9 (ref 5–15)
BUN: 21 mg/dL (ref 8–23)
CALCIUM: 9.3 mg/dL (ref 8.9–10.3)
CHLORIDE: 100 mmol/L (ref 98–111)
CO2: 27 mmol/L (ref 22–32)
CREATININE: 1.18 mg/dL — AB (ref 0.44–1.00)
GFR calc non Af Amer: 50 mL/min — ABNORMAL LOW (ref >60)
GFR, EST AFRICAN AMERICAN: 58 mL/min — AB (ref >60)
Glucose, Bld: 189 mg/dL — ABNORMAL HIGH (ref 70–99)
Potassium: 3.5 mmol/L (ref 3.5–5.1)
SODIUM: 136 mmol/L (ref 135–145)

## 2018-02-05 LAB — CBC
HCT: 41.7 % (ref 36.0–46.0)
Hemoglobin: 13.5 g/dL (ref 12.0–15.0)
MCH: 28.4 pg (ref 26.0–34.0)
MCHC: 32.4 g/dL (ref 30.0–36.0)
MCV: 87.6 fL (ref 80.0–100.0)
Platelets: 332 10*3/uL (ref 150–400)
RBC: 4.76 MIL/uL (ref 3.87–5.11)
RDW: 14.1 % (ref 11.5–15.5)
WBC: 7.8 10*3/uL (ref 4.0–10.5)
nRBC: 0 % (ref 0.0–0.2)

## 2018-02-05 LAB — TROPONIN I

## 2018-02-05 NOTE — ED Triage Notes (Signed)
Pt has been constipated and was in bathroom and had bowel movement that then turned to diarrhea.  After having bowel movement husband reports patient passed out.  Patient denies pain at this time.  Appears well in triage.  Unlabored. Color WNL.  As long as patient is here she has a spot she has had on her head since she was a little girl and she would like this looked at as well.

## 2018-02-06 ENCOUNTER — Emergency Department: Payer: Commercial Managed Care - PPO

## 2018-02-06 ENCOUNTER — Emergency Department
Admission: EM | Admit: 2018-02-06 | Discharge: 2018-02-06 | Disposition: A | Discharge status: Home / Self Care | Payer: Commercial Managed Care - PPO | Attending: Emergency Medicine | Admitting: Emergency Medicine

## 2018-02-06 DIAGNOSIS — R55 Syncope and collapse: Secondary | ICD-10-CM

## 2018-02-06 DIAGNOSIS — L7211 Pilar cyst: Secondary | ICD-10-CM

## 2018-02-06 DIAGNOSIS — S20211A Contusion of right front wall of thorax, initial encounter: Secondary | ICD-10-CM

## 2018-02-06 DIAGNOSIS — N3001 Acute cystitis with hematuria: Secondary | ICD-10-CM

## 2018-02-06 LAB — URINALYSIS, COMPLETE (UACMP) WITH MICROSCOPIC
Bilirubin Urine: NEGATIVE
Glucose, UA: >=500 mg/dL — AB
Hgb urine dipstick: NEGATIVE
Ketones, ur: NEGATIVE mg/dL
Nitrite: NEGATIVE
Protein, ur: NEGATIVE mg/dL
Specific Gravity, Urine: 1.024 (ref 1.005–1.030)
pH: 5 (ref 5.0–8.0)

## 2018-02-06 LAB — HEPATIC FUNCTION PANEL
ALT: 13 U/L (ref 0–44)
AST: 21 U/L (ref 15–41)
Albumin: 4.1 g/dL (ref 3.5–5.0)
Alkaline Phosphatase: 70 U/L (ref 38–126)
Bilirubin, Direct: <0.1 mg/dL (ref 0.0–0.2)
Total Bilirubin: 0.6 mg/dL (ref 0.3–1.2)
Total Protein: 6.9 g/dL (ref 6.5–8.1)

## 2018-02-06 MED ORDER — CEPHALEXIN 500 MG PO CAPS: 500.0000 mg | ORAL_CAPSULE | Freq: Two times a day (BID) | ORAL | 0 refills | Status: AC

## 2018-02-06 MED ORDER — CEPHALEXIN 500 MG PO CAPS
500.0000 mg | ORAL_CAPSULE | Freq: Once | ORAL | Status: AC
  Administered 2018-02-06: 500 mg via ORAL
  Filled 2018-02-06: qty 1

## 2018-02-06 MED ORDER — SODIUM CHLORIDE 0.9 % IV BOLUS
1000.0000 mL | Freq: Once | INTRAVENOUS | Status: AC
  Administered 2018-02-06: 1000 mL via INTRAVENOUS

## 2018-02-06 NOTE — Discharge Instructions (Signed)
Please follow up with your primary care provider. ° °Return to the ER for symptoms that change or worsen if unable to schedule an appointment. °

## 2018-02-06 NOTE — ED Provider Notes (Signed)
[facility] Emergency Department Provider Note  ____________________________________________   None    (approximate)  I have reviewed the triage vital signs and the nursing notes.   HISTORY  Chief Complaint Constipation and Loss of Consciousness   HPI April Johnson is a 62 y.o. female who presents to the emergency department aftera syncopal episode. She has had some constipation and strained to have a bowel movement earlier. She states she had a bowel movement, then had several episodes on loose stools. She was coming back from the bathroom when she began to feel cold, broke out in a sweat, and passed out. She hit a radiator with her right side when she fell. All was witnessed by her husband who states she came around after several seconds. Since that time, she has felt tired and a little weak, but is otherwise feeling back to normal.  She also has a chronic lesion on her scalp that she would like to have evaluated.   Past Medical History:  Diagnosis Date  . Acute bronchitis   . Allergy   . Arthritis   . Complication of anesthesia    difficult to wake up  . Diabetes mellitus without complication ([place])    type 2  . Diarrhea   . Esophagitis   . Excessive menstruation   . GERD (gastroesophageal reflux disease)   . Hyperlipidemia   . Hypertension    CONTROLLED ON MEDS  . Long term current use of insulin ([place])   . Neuropathy    left leg and feet  . Shortness of breath dyspnea    upon exertion  . Wears partial dentures    upper / does not wear    Patient Active Problem List   Diagnosis Date Noted  . CKD (chronic kidney disease) stage 3, GFR 30-59 ml/min (HCC) 04/14/2017  . Eczema 04/04/2017  . Diabetic peripheral neuropathy ([place]) 09/27/2016  . Depression, major, single episode, moderate ([place]) 09/27/2016  . Hypertension 10/27/2015  . RLS (restless legs syndrome) 07/20/2015  . Allergic rhinitis 09/16/2014  . Diabetes mellitus ([place])  09/16/2014  . Hyperlipidemia 09/16/2014  . Insulin long-term use ([place]) 09/16/2014  . Esophagitis 09/16/2014  . Hypertensive chronic kidney disease 09/16/2014  . Insomnia 09/16/2014    Past Surgical History:  Procedure Laterality Date  . CHOLECYSTECTOMY    . COLONOSCOPY WITH PROPOFOL N/A 01/12/2015   Procedure: COLONOSCOPY WITH PROPOFOL;  Surgeon: Lucilla Lame, MD;  Location: [place];  Service: Endoscopy;  Laterality: N/A;  DIABETIC-INSULIN DEPENDENT  . POLYPECTOMY  01/12/2015   Procedure: POLYPECTOMY;  Surgeon: Lucilla Lame, MD;  Location: [place];  Service: Endoscopy;;  . WRIST SURGERY Right    ganglion cyst    Prior to Admission medications   Medication Sig Start Date End Date Taking? Authorizing Provider  amLODipine (NORVASC) 5 MG tablet Take 1 tablet (5 mg total) by mouth daily. 01/24/18   Marnee Guarneri T, NP  aspirin 81 MG tablet Take 81 mg by mouth daily. am    [provider]  atorvastatin (LIPITOR) 20 MG tablet Take 1 tablet (20 mg total) by mouth daily at 6 PM. 01/24/18   Cannady, [name] T, NP  B-D ULTRAFINE III SHORT PEN 31G X 8 MM MISC USE AS DIRECTED 02/23/15   Guadalupe Maple, MD  cephALEXin (KEFLEX) 500 MG capsule Take 1 capsule (500 mg total) by mouth 2 (two) times daily for 7 days. 02/06/18 02/13/18  Loren Vicens, Dessa Phi, FNP  gabapentin (NEURONTIN) 300  MG capsule TAKE 1 CAPSULE BY MOUTH TWICE A DAY 07/20/17   Kathrine Haddock, NP  glucose blood test strip 1 each by Other route as needed for other. Use as instructed    [provider]  INVOKANA 300 MG TABS tablet TAKE 1 TABLET (300 MG TOTAL) BY MOUTH DAILY BEFORE BREAKFAST. 01/16/18   Cannady, [name] T, NP  LEVEMIR FLEXTOUCH 100 UNIT/ML Pen INJECT 60 UNITS INTO THE SKIN AT BEDTIME. 07/19/17   Kathrine Haddock, NP  metoprolol succinate (TOPROL-XL) 100 MG 24 hr tablet TAKE 1 TABLET BY MOUTH DAILY WITH OR IMMEDIATELY FOLLOWING A MEAL. 01/24/18   Cannady, Henrine Screws T, NP  nystatin  (MYCOSTATIN/NYSTOP) powder Apply topically 4 (four) times daily. 01/24/18   Cannady, Henrine Screws T, NP  omeprazole (PRILOSEC) 20 MG capsule TAKE ONE CAPSULE BY MOUTH DAILY 06/26/17   Kathrine Haddock, NP  ondansetron (ZOFRAN) 4 MG tablet Take 1 tablet (4 mg total) by mouth every 8 (eight) hours as needed for nausea or vomiting. 01/24/18   Cannady, [name] T, NP  OZEMPIC, 0.25 OR 0.5 MG/DOSE, 2 MG/1.5ML SOPN INJECT 0.5 MG INTO THE SKIN ONCE A WEEK. 12/11/17   Cannady, Henrine Screws T, NP  pioglitazone-metformin (ACTOPLUS MET) 15-500 MG tablet TAKE 1 TABLET BY MOUTH 2 (TWO) TIMES DAILY WITH A MEAL. 09/27/17   Kathrine Haddock, NP  Semaglutide (OZEMPIC) 0.25 or 0.5 MG/DOSE SOPN Inject 0.75 mg into the skin once a week. 10/24/17   Trinna Post, PA-C  telmisartan-hydrochlorothiazide (MICARDIS HCT) 80-25 MG tablet TAKE 1 TABLET BY MOUTH DAILY. 04/26/17   Kathrine Haddock, NP    Allergies Niacin and related and Cyclobenzaprine  Family History  Problem Relation Age of Onset  . Diabetes Sister   . Diabetes Brother   . Stroke Paternal Grandmother   . Diabetes Sister   . Diabetes Sister     Social History Social History   Tobacco Use  . Smoking status: Former Smoker    Packs/day: 0.25    Years: 10.00    Pack years: 2.50    Types: Cigarettes    Last attempt to quit: 03/08/1999    Years since quitting: 18.9  . Smokeless tobacco: Never Used  Substance Use Topics  . Alcohol use: No    Alcohol/week: 0.0 standard drinks  . Drug use: No    Review of Systems  Constitutional: No fever/chills Eyes: No visual changes. ENT: No sore throat. Cardiovascular: Denies chest pain. Respiratory: Denies shortness of breath. Gastrointestinal: No abdominal pain.  No nausea, no vomiting.  Positive for constipation followed by diarrhea.. Genitourinary: Negative for dysuria. Musculoskeletal: Positive for right side mid back pain. Skin: Negative for rash. Neurological: Positive for faint headache and generalized  weakness ____________________________________________   PHYSICAL EXAM:  VITAL SIGNS: ED Triage Vitals  Enc Vitals Group     BP 02/05/18 2242 (!) 112/55     Pulse Rate 02/05/18 2242 80     Resp 02/05/18 2242 18     Temp 02/05/18 2242 98.4 F (36.9 C)     Temp Source 02/05/18 2242 Oral     SpO2 02/05/18 2242 96 %     Weight 02/05/18 2238 207 lb (93.9 kg)     Height 02/05/18 2238 '5\' 5"'  (1.651 m)     Head Circumference --      Peak Flow --      Pain Score 02/05/18 2238 0     Pain Loc --      Pain Edu? --  Excl. in [place]? --     Constitutional: Alert and oriented. Well appearing and in no acute distress. Eyes: Conjunctivae are normal. PERRL. EOMI. Head: Atraumatic. Nose: No congestion/rhinnorhea. Mouth/Throat: Mucous membranes are moist.  Oropharynx non-erythematous. Neck: No stridor.  Nexus criteria is negative. Cardiovascular: Normal rate, regular rhythm. Grossly normal heart sounds.  Good peripheral circulation. Respiratory: Normal respiratory effort.  No retractions. Lungs CTAB. Gastrointestinal: Soft and nontender. No distention. No abdominal bruits. No CVA tenderness. Musculoskeletal: No lower extremity tenderness nor edema.  No joint effusions.  Focal tenderness over the right posterior lateral ribs. Neurologic:  Normal speech and language. No gross focal neurologic deficits are appreciated. No gait instability. Skin:  Skin is warm, dry and intact. No rash noted.  No open wound or lesion noted.  There is a small, nonmobile lesion on the scalp. Psychiatric: Mood and affect are normal. Speech and behavior are normal.  ____________________________________________   LABS (all labs ordered are listed, but only abnormal results are displayed)  Labs Reviewed  BASIC METABOLIC PANEL - Abnormal; Notable for the following components:      Result Value   Glucose, Bld 189 (*)    Creatinine, Ser 1.18 (*)    GFR calc non Af Amer 50 (*)    GFR calc Af Amer 58 (*)    All other  components within normal limits  URINALYSIS, COMPLETE (UACMP) WITH MICROSCOPIC - Abnormal; Notable for the following components:   Color, Urine YELLOW (*)    APPearance HAZY (*)    Glucose, UA >=500 (*)    Leukocytes, UA LARGE (*)    Bacteria, UA RARE (*)    All other components within normal limits  CBC  TROPONIN I  HEPATIC FUNCTION PANEL   ____________________________________________  EKG  ED ECG REPORT I, Sherrie George, the attending physician, personally viewed and interpreted this ECG.   Date: 02/05/2018  EKG Time: 10:49 PM  Rate: 80  Rhythm: normal EKG, normal sinus rhythm  Axis: Normal axis  Intervals:none  ST&T Change: No ST elevation  ____________________________________________  RADIOLOGY  ED MD interpretation: CT head negative for acute findings.  Chest x-ray with right rib detail negative for acute bony abnormality.  No cardiopulmonary abnormality identified  Official radiology report(s): Dg Ribs Unilateral W/chest Right  Result Date: 02/06/2018 CLINICAL DATA:  62 year old female with fall and right chest wall pain. EXAM: RIGHT RIBS AND CHEST - 3+ VIEW COMPARISON:  None. FINDINGS: No fracture or other bone lesions are seen involving the ribs. There is no evidence of pneumothorax or pleural effusion. Both lungs are clear. Heart size and mediastinal contours are within normal limits. IMPRESSION: Negative. Electronically Signed   By: Anner Crete M.D.   On: 02/06/2018 02:27   Ct Head Wo Contrast  Result Date: 02/06/2018 CLINICAL DATA:  Headache, syncope. EXAM: CT HEAD WITHOUT CONTRAST TECHNIQUE: Contiguous axial images were obtained from the base of the skull through the vertex without intravenous contrast. COMPARISON:  None. FINDINGS: Brain: No acute intracranial abnormality. Specifically, no hemorrhage, hydrocephalus, mass lesion, acute infarction, or significant intracranial injury. Vascular: No hyperdense vessel or unexpected calcification. Skull: No acute  calvarial abnormality. Sinuses/Orbits: Visualized paranasal sinuses and mastoids clear. Orbital soft tissues unremarkable. Other: None IMPRESSION: No acute intracranial abnormality. Electronically Signed   By: Rolm Baptise M.D.   On: 02/06/2018 02:26    ____________________________________________   PROCEDURES  Procedure(s) performed: None  Procedures  Critical Care performed: No  ____________________________________________   INITIAL IMPRESSION / ASSESSMENT AND PLAN /  ED COURSE  As part of my medical decision making, I reviewed the following data within the [place] History obtained from family   62 year old female presenting to the emergency department for treatment and evaluation after a syncopal episode that was witnessed by her husband.  Images and exam as well as labs are reassuring with the exception of her urinalysis.  There is some concern for urinary tract infection for which she will be treated with Keflex.  The lesion that she is feeling on her scalp that has been there since childhood is likely a pilar cyst.  She was encouraged to see dermatology if she would like to have it removed.  She was advised that she should follow-up with her primary care provider this week.  She was encouraged to return to the emergency department for symptoms of change or worsen if she is unable to schedule an appointment.      ____________________________________________   FINAL CLINICAL IMPRESSION(S) / ED DIAGNOSES  Final diagnoses:  Vasovagal episode  Acute cystitis with hematuria  Rib contusion, right, initial encounter  Pilar cyst     ED Discharge Orders         Ordered    cephALEXin (KEFLEX) 500 MG capsule  2 times daily     02/06/18 0322           Note:  This document was prepared using Dragon voice recognition software and may include unintentional dictation errors.    Victorino Dike, FNP 02/06/18 2343    Gregor Hams, MD 02/06/18  [phone]

## 2018-02-06 NOTE — ED Notes (Signed)
2 unsuccessful PIV attempts by this RN (right AC and right hand). Raquel, Agricultural consultant, made aware and in to attempt access at this time.

## 2018-02-07 ENCOUNTER — Telehealth: Payer: Self-pay | Admitting: Nurse Practitioner

## 2018-02-07 NOTE — Telephone Encounter (Signed)
Spoke to April Johnson via telephone to f/u on her ER visit yesterday.  She reports she has a "bit of a cold" and a urinary infection "right now", so she has been resting.  Denies any further syncopal episodes and states her BS this morning was 136.  Have advised her to scheduled appointment for follow-up this week or next week.  Advised her to monitor sugars closely while acutely sick with URI and UTI, which she reports she has been.  States that she had strained "too hard" on toilet while having BM and that is what cause Vasovagal episode yesterday.  She reported she would schedule appt.

## 2018-02-09 ENCOUNTER — Other Ambulatory Visit: Payer: Self-pay | Admitting: Unknown Physician Specialty

## 2018-02-13 ENCOUNTER — Ambulatory Visit (INDEPENDENT_AMBULATORY_CARE_PROVIDER_SITE_OTHER): Payer: Commercial Managed Care - PPO | Admitting: Nurse Practitioner

## 2018-02-13 ENCOUNTER — Encounter: Payer: Self-pay | Admitting: Nurse Practitioner

## 2018-02-13 VITALS — BP 105/74 | HR 72 | Temp 98.1°F | Ht 65.0 in | Wt 208.2 lb

## 2018-02-13 DIAGNOSIS — N3000 Acute cystitis without hematuria: Secondary | ICD-10-CM | POA: Diagnosis not present

## 2018-02-13 DIAGNOSIS — J069 Acute upper respiratory infection, unspecified: Secondary | ICD-10-CM

## 2018-02-13 NOTE — Patient Instructions (Signed)
Upper Respiratory Infection, Adult Most upper respiratory infections (URIs) are caused by a virus. A URI affects the nose, throat, and upper air passages. The most common type of URI is often called "the common cold." Follow these instructions at home:  Take medicines only as told by your doctor.  Gargle warm saltwater or take cough drops to comfort your throat as told by your doctor.  Use a warm mist humidifier or inhale steam from a shower to increase air moisture. This may make it easier to breathe.  Drink enough fluid to keep your pee (urine) clear or pale yellow.  Eat soups and other clear broths.  Have a healthy diet.  Rest as needed.  Go back to work when your fever is gone or your doctor says it is okay. ? You may need to stay home longer to avoid giving your URI to others. ? You can also wear a face mask and wash your hands often to prevent spread of the virus.  Use your inhaler more if you have asthma.  Do not use any tobacco products, including cigarettes, chewing tobacco, or electronic cigarettes. If you need help quitting, ask your doctor. Contact a doctor if:  You are getting worse, not better.  Your symptoms are not helped by medicine.  You have chills.  You are getting more short of breath.  You have brown or red mucus.  You have yellow or brown discharge from your nose.  You have pain in your face, especially when you bend forward.  You have a fever.  You have puffy (swollen) neck glands.  You have pain while swallowing.  You have white areas in the back of your throat. Get help right away if:  You have very bad or constant: ? Headache. ? Ear pain. ? Pain in your forehead, behind your eyes, and over your cheekbones (sinus pain). ? Chest pain.  You have long-lasting (chronic) lung disease and any of the following: ? Wheezing. ? Long-lasting cough. ? Coughing up blood. ? A change in your usual mucus.  You have a stiff neck.  You have  changes in your: ? Vision. ? Hearing. ? Thinking. ? Mood. This information is not intended to replace advice given to you by your health care provider. Make sure you discuss any questions you have with your health care provider. Document Released: 08/10/2007 Document Revised: 10/25/2015 Document Reviewed: 05/29/2013 Elsevier Interactive Patient Education  2018 Elsevier Inc.  

## 2018-02-13 NOTE — Progress Notes (Signed)
BP 105/74 (BP Location: Left Arm, Patient Position: Sitting, Cuff Size: Large)   Pulse 72   Temp 98.1 F (36.7 C)   Ht 5\' 5"  (1.651 m)   Wt 208 lb 4 oz (94.5 kg)   LMP  (LMP Unknown)   SpO2 95%   BMI 34.65 kg/m    Subjective:    [name], female    DOB: 02-26-1956, 62 y.o.   MRN: [id number]  HPI: April Johnson is a 62 y.o. female presents for ER follow-up and URI  Chief Complaint  Patient presents with  . ER follow up    UTI  . URI    chest congestion   ER FOLLOW UP Time since discharge: 02/06/18 Hospital/facility: [facility] Diagnosis:  UTI and vasovagal episode from straining with BM Procedures/tests: urine sample, CT scan negative Consultants: none New medications: Keflex, still has two days left Discharge instructions:  F/U with PCP Status: better, no further symptoms or syncopal episodes  UPPER RESPIRATORY TRACT INFECTION Started last week, continues to have congestion.  Has been taking Alka Seltzer + Sinus and cough medicine OTC.   Reports it is improving, but still feels fatigued.  Cough worse in morning and better throughout day. Worst symptom: cough Fever: no Cough: yes Shortness of breath: no Wheezing: no Chest pain: yes, with cough Chest tightness: yes Chest congestion: yes Nasal congestion: yes Runny nose: yes Post nasal drip: yes Sneezing: no Sore throat: yes in the morning only Swollen glands: no Sinus pressure: no Headache: no Face pain: no Toothache: no Ear pain: no none Ear pressure: no none Eyes red/itching:no Eye drainage/crusting: no  Vomiting: no Rash: no Fatigue: yes Sick contacts: yes Strep contacts: no  Context: fluctuating Recurrent sinusitis: no Relief with OTC cold/cough medications: yes  Treatments attempted: cold/sinus and cough syrup   Relevant past medical, surgical, family and social history reviewed and updated as indicated. Interim medical history since our last visit reviewed. Allergies  and medications reviewed and updated.  Review of Systems  Constitutional: Positive for fatigue. Negative for activity change, appetite change, chills and fever.  HENT: Positive for congestion, postnasal drip, rhinorrhea and sore throat (morning only). Negative for ear discharge, ear pain, sinus pressure, sinus pain, sneezing, trouble swallowing and voice change.   Eyes: Negative for pain, itching and visual disturbance.  Respiratory: Positive for cough and chest tightness. Negative for shortness of breath and wheezing.   Cardiovascular: Negative for chest pain, palpitations and leg swelling.  Gastrointestinal: Negative for abdominal distention, abdominal pain, constipation, diarrhea, nausea and vomiting.  Genitourinary: Negative.   Musculoskeletal: Negative.   Allergic/Immunologic: Negative.   Neurological: Negative for dizziness, numbness and headaches.  Hematological: Negative.   Psychiatric/Behavioral: Negative.     Per HPI unless specifically indicated above     Objective:    BP 105/74 (BP Location: Left Arm, Patient Position: Sitting, Cuff Size: Large)   Pulse 72   Temp 98.1 F (36.7 C)   Ht 5\' 5"  (1.651 m)   Wt 208 lb 4 oz (94.5 kg)   LMP  (LMP Unknown)   SpO2 95%   BMI 34.65 kg/m   Wt Readings from Last 3 Encounters:  02/13/18 208 lb 4 oz (94.5 kg)  02/05/18 207 lb (93.9 kg)  01/24/18 211 lb (95.7 kg)    Physical Exam  Constitutional: She is oriented to person, place, and time. She appears well-developed and well-nourished.  HENT:  Head: Normocephalic.  Right Ear: Hearing, tympanic membrane, external ear  and ear canal normal.  Left Ear: Hearing, tympanic membrane, external ear and ear canal normal.  Nose: Mucosal edema and rhinorrhea present. Right sinus exhibits no maxillary sinus tenderness and no frontal sinus tenderness. Left sinus exhibits no maxillary sinus tenderness and no frontal sinus tenderness.  Mouth/Throat: Uvula is midline, oropharynx is clear and  moist and mucous membranes are normal. No oropharyngeal exudate, posterior oropharyngeal edema or posterior oropharyngeal erythema.  Eyes: Pupils are equal, round, and reactive to light. Conjunctivae and EOM are normal. Right eye exhibits no discharge. Left eye exhibits no discharge.  Neck: Normal range of motion. Neck supple. No JVD present. Carotid bruit is not present. No thyromegaly present.  Cardiovascular: Normal rate, regular rhythm and normal heart sounds.  Pulmonary/Chest: Effort normal and breath sounds normal.  Clear throughout with no wheezing or crackles noted.  Abdominal: Soft. Bowel sounds are normal.  No suprapubic or CVA tenderness.  Lymphadenopathy:       Head (right side): No submental, no submandibular and no tonsillar adenopathy present.       Head (left side): No submental, no submandibular and no tonsillar adenopathy present.    She has no cervical adenopathy.  Neurological: She is alert and oriented to person, place, and time.  Skin: Skin is warm and dry.  Psychiatric: She has a normal mood and affect. Her behavior is normal. Judgment and thought content normal.  Nursing note and vitals reviewed.   Results for orders placed or performed during the hospital encounter of 93/71/69  Basic metabolic panel  Result Value Ref Range   Sodium 136 135 - 145 mmol/L   Potassium 3.5 3.5 - 5.1 mmol/L   Chloride 100 98 - 111 mmol/L   CO2 27 22 - 32 mmol/L   Glucose, Bld 189 (H) 70 - 99 mg/dL   BUN 21 8 - 23 mg/dL   Creatinine, Ser 1.18 (H) 0.44 - 1.00 mg/dL   Calcium 9.3 8.9 - 10.3 mg/dL   GFR calc non Af Amer 50 (L) >60 mL/min   GFR calc Af Amer 58 (L) >60 mL/min   Anion gap 9 5 - 15  CBC  Result Value Ref Range   WBC 7.8 4.0 - 10.5 K/uL   RBC 4.76 3.87 - 5.11 MIL/uL   Hemoglobin 13.5 12.0 - 15.0 g/dL   HCT 41.7 36.0 - 46.0 %   MCV 87.6 80.0 - 100.0 fL   MCH 28.4 26.0 - 34.0 pg   MCHC 32.4 30.0 - 36.0 g/dL   RDW 14.1 11.5 - 15.5 %   Platelets 332 150 - 400 K/uL    nRBC 0.0 0.0 - 0.2 %  Urinalysis, Complete w Microscopic  Result Value Ref Range   Color, Urine YELLOW (A) YELLOW   APPearance HAZY (A) CLEAR   Specific Gravity, Urine 1.024 1.005 - 1.030   pH 5.0 5.0 - 8.0   Glucose, UA >=500 (A) NEGATIVE mg/dL   Hgb urine dipstick NEGATIVE NEGATIVE   Bilirubin Urine NEGATIVE NEGATIVE   Ketones, ur NEGATIVE NEGATIVE mg/dL   Protein, ur NEGATIVE NEGATIVE mg/dL   Nitrite NEGATIVE NEGATIVE   Leukocytes, UA LARGE (A) NEGATIVE   RBC / HPF 0-5 0 - 5 RBC/hpf   WBC, UA 21-50 0 - 5 WBC/hpf   Bacteria, UA RARE (A) NONE SEEN   Squamous Epithelial / LPF 0-5 0 - 5   Mucus PRESENT    Hyaline Casts, UA PRESENT   Troponin I - ONCE - STAT  Result Value Ref  Range   Troponin I <0.03 <0.03 ng/mL  Hepatic function panel  Result Value Ref Range   Total Protein 6.9 6.5 - 8.1 g/dL   Albumin 4.1 3.5 - 5.0 g/dL   AST 21 15 - 41 U/L   ALT 13 0 - 44 U/L   Alkaline Phosphatase 70 38 - 126 U/L   Total Bilirubin 0.6 0.3 - 1.2 mg/dL   Bilirubin, Direct <0.1 0.0 - 0.2 mg/dL   Indirect Bilirubin NOT CALCULATED 0.3 - 0.9 mg/dL      Assessment & Plan:   Problem List Items Addressed This Visit      Respiratory   Viral upper respiratory tract infection - Primary    Acute, reports improving.  Recommend use of Claritin daily 10MG  and OTC Flonase during acute phase.  Increase fluid hydration and rest.  Humidifier in room for comfort.  Guaifenesin for cough as needed.  Return for worsening or continued symptoms.  No abx at this time, abx stewardship, patient agrees with this POC.        Genitourinary   Acute cystitis without hematuria    Acute, improving with Keflex.  Continue abx to completion.  Increase fluid hydration.  Return for worsening symptoms.          Follow up plan: Return if symptoms worsen or fail to improve.

## 2018-02-13 NOTE — Assessment & Plan Note (Signed)
Acute, reports improving.  Recommend use of Claritin daily 10MG  and OTC Flonase during acute phase.  Increase fluid hydration and rest.  Humidifier in room for comfort.  Guaifenesin for cough as needed.  Return for worsening or continued symptoms.  No abx at this time, abx stewardship, patient agrees with this POC.

## 2018-02-13 NOTE — Assessment & Plan Note (Signed)
Acute, improving with Keflex.  Continue abx to completion.  Increase fluid hydration.  Return for worsening symptoms.

## 2018-03-08 ENCOUNTER — Telehealth: Payer: Self-pay | Admitting: Nurse Practitioner

## 2018-03-08 NOTE — Telephone Encounter (Signed)
Copied from [place] [id number]. Topic: Quick Communication - Rx Refill/Question >> Mar 08, 2018  3:59 PM Alanda Slim E wrote: Medication: fluconazole (DIFLUCAN) 150 MG tablet   Pt called in and asked to have this Medication called in for her yeast infection. Pt has been using nystatin (MYCOSTATIN/NYSTOP) powder. Pt states that the powder helps a bit but the pill stops it from coming back so fast or often.   Has the patient contacted their pharmacy? No   Preferred Pharmacy (with phone number or street name): [id number] - [place], [place]. [address] [phone] (Phone) [phone] (Fax)    Agent: Please be advised that RX refills may take up to 3 business days. We ask that you follow-up with your pharmacy.

## 2018-03-09 ENCOUNTER — Other Ambulatory Visit: Payer: Self-pay | Admitting: Nurse Practitioner

## 2018-03-09 MED ORDER — FLUCONAZOLE 150 MG PO TABS: 150.0000 mg | ORAL_TABLET | Freq: Once | ORAL | 0 refills | Status: AC

## 2018-03-09 NOTE — Telephone Encounter (Signed)
Spoke to patient via telephone.  Complete.

## 2018-03-09 NOTE — Telephone Encounter (Signed)
Please see patients request for Diflucan /

## 2018-03-09 NOTE — Progress Notes (Signed)
April Johnson reports that Nystatin is not working for her and is requesting one dose Diflucan, which she reports clears up yeast.  Spoke to her via telephone and will send in script, but have advised her to hold Atorvastatin for 72 hours after using Diflucan due to possibility for interaction between the two medications.  She was able to verbalize back this plan of care.  Script sent.

## 2018-04-03 ENCOUNTER — Other Ambulatory Visit: Payer: Self-pay | Admitting: Nurse Practitioner

## 2018-04-09 ENCOUNTER — Other Ambulatory Visit: Payer: Self-pay | Admitting: Nurse Practitioner

## 2018-04-09 DIAGNOSIS — E1122 Type 2 diabetes mellitus with diabetic chronic kidney disease: Secondary | ICD-10-CM

## 2018-04-09 DIAGNOSIS — N182 Chronic kidney disease, stage 2 (mild): Principal | ICD-10-CM

## 2018-04-09 DIAGNOSIS — Z794 Long term (current) use of insulin: Principal | ICD-10-CM

## 2018-04-09 MED ORDER — SEMAGLUTIDE(0.25 OR 0.5MG/DOS) 2 MG/1.5ML ~~LOC~~ SOPN: 0.7500 mg | PEN_INJECTOR | SUBCUTANEOUS | 1 refills | Status: DC

## 2018-04-09 NOTE — Telephone Encounter (Signed)
Copied from [place] [id number]. Topic: Quick Communication - Rx Refill/Question >> Apr 09, 2018  2:23 PM Yvette Rack wrote: Medication: OZEMPIC, 0.25 OR 0.5 MG/DOSE, 2 MG/1.5ML SOPN  Has the patient contacted their pharmacy? Yes. Pt called today  (Agent: If no, request that the patient contact the pharmacy for the refill.) (Agent: If yes, when and what did the pharmacy advise?) they never received a RX for medicine to call provider  Preferred Pharmacy (with phone number or street name):   [id number] - [place], [address] [phone] (Phone) [phone] (Fax)    Agent: Please be advised that RX refills may take up to 3 business days. We ask that you follow-up with your pharmacy.

## 2018-04-11 ENCOUNTER — Other Ambulatory Visit: Payer: Self-pay | Admitting: Unknown Physician Specialty

## 2018-04-18 ENCOUNTER — Other Ambulatory Visit: Payer: Self-pay | Admitting: Unknown Physician Specialty

## 2018-04-18 NOTE — Telephone Encounter (Signed)
Requested Prescriptions  Pending Prescriptions Disp Refills  . omeprazole (PRILOSEC) 20 MG capsule [Pharmacy Med Name: OMEPRAZOLE DR 20 MG CAPSULE] 90 capsule 0    Sig: TAKE ONE CAPSULE BY MOUTH DAILY     Gastroenterology: Proton Pump Inhibitors Passed - 04/18/2018  1:47 AM      Passed - Valid encounter within last 12 months    Recent Outpatient Visits          2 months ago Acute cystitis without hematuria   [place], [name] T, NP   2 months ago Type 2 diabetes mellitus with stage 2 chronic kidney disease, with long-term current use of insulin ([place])   [place], [place] T, NP   5 months ago Type 2 diabetes mellitus with stage 2 chronic kidney disease, with long-term current use of insulin ([place])   [place], Adriana M, PA-C   9 months ago Type 2 diabetes mellitus with other specified complication, without long-term current use of insulin ([place])   [place] Kathrine Haddock, NP   1 year ago Urinary frequency   [place] Kathrine Haddock, NP      Future Appointments            In 1 week Cannady, [name], NP [employer] [employer], [facility]

## 2018-04-23 ENCOUNTER — Other Ambulatory Visit: Payer: Self-pay | Admitting: Nurse Practitioner

## 2018-04-26 ENCOUNTER — Encounter: Payer: Self-pay | Admitting: Nurse Practitioner

## 2018-04-26 ENCOUNTER — Other Ambulatory Visit: Payer: Self-pay

## 2018-04-26 ENCOUNTER — Ambulatory Visit: Payer: Commercial Managed Care - PPO | Admitting: Nurse Practitioner

## 2018-04-26 VITALS — BP 103/64 | HR 71 | Temp 98.0°F | Ht 65.0 in | Wt 212.1 lb

## 2018-04-26 DIAGNOSIS — E1122 Type 2 diabetes mellitus with diabetic chronic kidney disease: Secondary | ICD-10-CM | POA: Diagnosis not present

## 2018-04-26 DIAGNOSIS — N183 Chronic kidney disease, stage 3 unspecified: Secondary | ICD-10-CM

## 2018-04-26 DIAGNOSIS — N182 Chronic kidney disease, stage 2 (mild): Secondary | ICD-10-CM

## 2018-04-26 DIAGNOSIS — E1169 Type 2 diabetes mellitus with other specified complication: Secondary | ICD-10-CM | POA: Diagnosis not present

## 2018-04-26 DIAGNOSIS — I1310 Hypertensive heart and chronic kidney disease without heart failure, with stage 1 through stage 4 chronic kidney disease, or unspecified chronic kidney disease: Secondary | ICD-10-CM

## 2018-04-26 DIAGNOSIS — E785 Hyperlipidemia, unspecified: Secondary | ICD-10-CM

## 2018-04-26 DIAGNOSIS — Z794 Long term (current) use of insulin: Secondary | ICD-10-CM

## 2018-04-26 LAB — MICROALBUMIN, URINE WAIVED
Creatinine, Urine Waived: 100 mg/dL (ref 10–300)
Microalb, Ur Waived: 30 mg/L — ABNORMAL HIGH (ref 0–19)
Microalb/Creat Ratio: <30 mg/g (ref <30)

## 2018-04-26 LAB — BAYER DCA HB A1C WAIVED: HB A1C: 6.1 % (ref <7.0)

## 2018-04-26 NOTE — Assessment & Plan Note (Signed)
Chronic, ongoing.  A1C today 6.1%.  Have reiterated need to decrease Levemir by 2 units every 3 days based on morning blood sugar readings.  She is to continue to do this.  Will continue Ozempic at current dose + Invokana and Actoplus.  Goal is to be able to discontinue Levemir.  Return in 3 months.

## 2018-04-26 NOTE — Patient Instructions (Signed)
Diabetes Mellitus and Nutrition, Adult  When you have diabetes (diabetes mellitus), it is very important to have healthy eating habits because your blood sugar (glucose) levels are greatly affected by what you eat and drink. Eating healthy foods in the appropriate amounts, at about the same times every day, can help you:  · Control your blood glucose.  · Lower your risk of heart disease.  · Improve your blood pressure.  · Reach or maintain a healthy weight.  Every person with diabetes is different, and each person has different needs for a meal plan. Your health care provider may recommend that you work with a diet and nutrition specialist (dietitian) to make a meal plan that is best for you. Your meal plan may vary depending on factors such as:  · The calories you need.  · The medicines you take.  · Your weight.  · Your blood glucose, blood pressure, and cholesterol levels.  · Your activity level.  · Other health conditions you have, such as heart or kidney disease.  How do carbohydrates affect me?  Carbohydrates, also called carbs, affect your blood glucose level more than any other type of food. Eating carbs naturally raises the amount of glucose in your blood. Carb counting is a method for keeping track of how many carbs you eat. Counting carbs is important to keep your blood glucose at a healthy level, especially if you use insulin or take certain oral diabetes medicines.  It is important to know how many carbs you can safely have in each meal. This is different for every person. Your dietitian can help you calculate how many carbs you should have at each meal and for each snack.  Foods that contain carbs include:  · Bread, cereal, rice, pasta, and crackers.  · Potatoes and corn.  · Peas, beans, and lentils.  · Milk and yogurt.  · Fruit and juice.  · Desserts, such as cakes, cookies, ice cream, and candy.  How does alcohol affect me?  Alcohol can cause a sudden decrease in blood glucose (hypoglycemia),  especially if you use insulin or take certain oral diabetes medicines. Hypoglycemia can be a life-threatening condition. Symptoms of hypoglycemia (sleepiness, dizziness, and confusion) are similar to symptoms of having too much alcohol.  If your health care provider says that alcohol is safe for you, follow these guidelines:  · Limit alcohol intake to no more than 1 drink per day for nonpregnant women and 2 drinks per day for men. One drink equals 12 oz of beer, 5 oz of wine, or 1½ oz of hard liquor.  · Do not drink on an empty stomach.  · Keep yourself hydrated with water, diet soda, or unsweetened iced tea.  · Keep in mind that regular soda, juice, and other mixers may contain a lot of sugar and must be counted as carbs.  What are tips for following this plan?    Reading food labels  · Start by checking the serving size on the "Nutrition Facts" label of packaged foods and drinks. The amount of calories, carbs, fats, and other nutrients listed on the label is based on one serving of the item. Many items contain more than one serving per package.  · Check the total grams (g) of carbs in one serving. You can calculate the number of servings of carbs in one serving by dividing the total carbs by 15. For example, if a food has 30 g of total carbs, it would be equal to 2   servings of carbs.  · Check the number of grams (g) of saturated and trans fats in one serving. Choose foods that have low or no amount of these fats.  · Check the number of milligrams (mg) of salt (sodium) in one serving. Most people should limit total sodium intake to less than 2,300 mg per day.  · Always check the nutrition information of foods labeled as "low-fat" or "nonfat". These foods may be higher in added sugar or refined carbs and should be avoided.  · Talk to your dietitian to identify your daily goals for nutrients listed on the label.  Shopping  · Avoid buying canned, premade, or processed foods. These foods tend to be high in fat, sodium,  and added sugar.  · Shop around the outside edge of the grocery store. This includes fresh fruits and vegetables, bulk grains, fresh meats, and fresh dairy.  Cooking  · Use low-heat cooking methods, such as baking, instead of high-heat cooking methods like deep frying.  · Cook using healthy oils, such as olive, canola, or sunflower oil.  · Avoid cooking with butter, cream, or high-fat meats.  Meal planning  · Eat meals and snacks regularly, preferably at the same times every day. Avoid going long periods of time without eating.  · Eat foods high in fiber, such as fresh fruits, vegetables, beans, and whole grains. Talk to your dietitian about how many servings of carbs you can eat at each meal.  · Eat 4-6 ounces (oz) of lean protein each day, such as lean meat, chicken, fish, eggs, or tofu. One oz of lean protein is equal to:  ? 1 oz of meat, chicken, or fish.  ? 1 egg.  ? ¼ cup of tofu.  · Eat some foods each day that contain healthy fats, such as avocado, nuts, seeds, and fish.  Lifestyle  · Check your blood glucose regularly.  · Exercise regularly as told by your health care provider. This may include:  ? 150 minutes of moderate-intensity or vigorous-intensity exercise each week. This could be brisk walking, biking, or water aerobics.  ? Stretching and doing strength exercises, such as yoga or weightlifting, at least 2 times a week.  · Take medicines as told by your health care provider.  · Do not use any products that contain nicotine or tobacco, such as cigarettes and e-cigarettes. If you need help quitting, ask your health care provider.  · Work with a counselor or diabetes educator to identify strategies to manage stress and any emotional and social challenges.  Questions to ask a health care provider  · Do I need to meet with a diabetes educator?  · Do I need to meet with a dietitian?  · What number can I call if I have questions?  · When are the best times to check my blood glucose?  Where to find more  information:  · American Diabetes Association: diabetes.org  · Academy of Nutrition and Dietetics: www.eatright.org  · National Institute of Diabetes and Digestive and Kidney Diseases (NIH): www.niddk.nih.gov  Summary  · A healthy meal plan will help you control your blood glucose and maintain a healthy lifestyle.  · Working with a diet and nutrition specialist (dietitian) can help you make a meal plan that is best for you.  · Keep in mind that carbohydrates (carbs) and alcohol have immediate effects on your blood glucose levels. It is important to count carbs and to use alcohol carefully.  This information is not intended to   replace advice given to you by your health care provider. Make sure you discuss any questions you have with your health care provider.  Document Released: 11/18/2004 Document Revised: 09/21/2016 Document Reviewed: 03/28/2016  Elsevier Interactive Patient Education © 2019 Elsevier Inc.

## 2018-04-26 NOTE — Progress Notes (Signed)
BP 103/64 (BP Location: Right Arm, Patient Position: Sitting, Cuff Size: Large)   Pulse 71   Temp 98 F (36.7 C)   Ht 5\' 5"  (1.651 m)   Wt 212 lb 2 oz (96.2 kg)   LMP  (LMP Unknown)   SpO2 95%   BMI 35.30 kg/m    Subjective:    [name], female    DOB: September 08, 1955, 63 y.o.   MRN: [id number]  HPI: April Johnson is a 63 y.o. female presents for follow-up  Chief Complaint  Patient presents with  . Diabetes  . Hypertension  . Hyperlipidemia   DIABETES Currently taking 0.5MG  Ozempic, is currently at 40 units of Levemir.  Has been educated multiple times on reducing Levemir by 2 units every 3 days based on BS readings in morning. Discussed risks of hypoglycemia and improvement in A1C with addition of Ozempic.  She continues to state wishes to stop taking insulin.  Is focused on diet changes at home and walking.  Last A1C was 5.9%.  Continues on Invokana and Actoplus. Hypoglycemic episodes:no Polydipsia/polyuria: no Visual disturbance: no Chest pain: no Paresthesias: no Glucose Monitoring: yes  Accucheck frequency: Daily  Fasting glucose: 100's  Post prandial:  Evening:  Before meals: Taking Insulin?: yes  Long acting insulin: 40 units Levemir  Short acting insulin: Blood Pressure Monitoring: not checking Retinal Examination: Up to Date Foot Exam: Up to Date Pneumovax: Up to Date Influenza: Up to Date Aspirin: yes   HYPERTENSION / HYPERLIPIDEMIA WITH CKD Continues on Micardis, Toprolol, and Norvasc for HTN + Lipitor for HLD. Satisfied with current treatment? yes Duration of hypertension: chronic BP monitoring frequency: not checking BP range:  BP medication side effects: no Duration of hyperlipidemia: chronic Cholesterol medication side effects: no Cholesterol supplements: none Medication compliance: good compliance Aspirin: yes Recent stressors: no Recurrent headaches: no Visual changes: no Palpitations: no Dyspnea: no Chest  pain: no Lower extremity edema: no Dizzy/lightheaded: no  Relevant past medical, surgical, family and social history reviewed and updated as indicated. Interim medical history since our last visit reviewed. Allergies and medications reviewed and updated.  Review of Systems  Constitutional: Negative for activity change, appetite change, diaphoresis, fatigue and fever.  Respiratory: Negative for cough, chest tightness and shortness of breath.   Cardiovascular: Negative for chest pain, palpitations and leg swelling.  Gastrointestinal: Negative for abdominal distention, abdominal pain, constipation, diarrhea, nausea and vomiting.  Endocrine: Negative for cold intolerance, heat intolerance, polydipsia, polyphagia and polyuria.  Neurological: Negative for dizziness, syncope, weakness, light-headedness, numbness and headaches.  Psychiatric/Behavioral: Negative.     Per HPI unless specifically indicated above     Objective:    BP 103/64 (BP Location: Right Arm, Patient Position: Sitting, Cuff Size: Large)   Pulse 71   Temp 98 F (36.7 C)   Ht 5\' 5"  (1.651 m)   Wt 212 lb 2 oz (96.2 kg)   LMP  (LMP Unknown)   SpO2 95%   BMI 35.30 kg/m   Wt Readings from Last 3 Encounters:  04/26/18 212 lb 2 oz (96.2 kg)  02/13/18 208 lb 4 oz (94.5 kg)  02/05/18 207 lb (93.9 kg)    Physical Exam Vitals signs and nursing note reviewed.  Constitutional:      Appearance: She is well-developed.  HENT:     Head: Normocephalic.  Eyes:     General:        Right eye: No discharge.  Left eye: No discharge.     Conjunctiva/sclera: Conjunctivae normal.     Pupils: Pupils are equal, round, and reactive to light.  Neck:     Musculoskeletal: Normal range of motion and neck supple.     Thyroid: No thyromegaly.     Vascular: No carotid bruit or JVD.  Cardiovascular:     Rate and Rhythm: Normal rate and regular rhythm.     Heart sounds: Normal heart sounds.  Pulmonary:     Effort: Pulmonary effort  is normal.     Breath sounds: Normal breath sounds.  Abdominal:     General: Bowel sounds are normal.     Palpations: Abdomen is soft.  Lymphadenopathy:     Cervical: No cervical adenopathy.  Skin:    General: Skin is warm and dry.  Neurological:     Mental Status: She is alert and oriented to person, place, and time.  Psychiatric:        Mood and Affect: Mood normal.        Behavior: Behavior normal.        Thought Content: Thought content normal.        Judgment: Judgment normal.     Results for orders placed or performed in visit on 04/26/18  Bayer DCA Hb A1c Waived  Result Value Ref Range   HB A1C (BAYER DCA - WAIVED) 6.1 <7.0 %  Microalbumin, Urine Waived  Result Value Ref Range   Microalb, Ur Waived 30 (H) 0 - 19 mg/L   Creatinine, Urine Waived 100 10 - 300 mg/dL   Microalb/Creat Ratio <30 <30 mg/g      Assessment & Plan:   Problem List Items Addressed This Visit      Cardiovascular and Mediastinum   Hypertensive heart/kidney disease without HF and with CKD stage III (HCC)    Chronic, ongoing.  Continue current medication regimen.  BP below goal today.  Adjust doses as needed based on BP and labs.  BMP next visit.        Endocrine   Diabetes mellitus ([place]) - Primary    Chronic, ongoing.  A1C today 6.1%.  Have reiterated need to decrease Levemir by 2 units every 3 days based on morning blood sugar readings.  She is to continue to do this.  Will continue Ozempic at current dose + Invokana and Actoplus.  Goal is to be able to discontinue Levemir.  Return in 3 months.      Relevant Orders   Bayer DCA Hb A1c Waived (Completed)   Microalbumin, Urine Waived (Completed)   Hyperlipidemia associated with type 2 diabetes mellitus (HCC)    Chronic, ongoing.  Continue current medication regimen.  Lipid panel at next visit and adjust dose as needed.        Genitourinary   CKD (chronic kidney disease) stage 3, GFR 30-59 ml/min (HCC)    Chronic, ongoing.  BMP December  with GFR 50.  Urine micro today 30 and A:C <30.  Continue ARB.  BMP next visit.        Other   Insulin long-term use (HCC)    Continue to reduce Levemir by 2 units every 3 days based on morning BS readings, educated patient at length on this.            Follow up plan: Return in about 3 months (around 07/25/2018) for T2DM, HTN/HLD.

## 2018-04-26 NOTE — Assessment & Plan Note (Signed)
Chronic, ongoing.  Continue current medication regimen.  BP below goal today.  Adjust doses as needed based on BP and labs.  BMP next visit.

## 2018-04-26 NOTE — Assessment & Plan Note (Signed)
Chronic, ongoing.  Continue current medication regimen.  Lipid panel at next visit and adjust dose as needed.

## 2018-04-26 NOTE — Assessment & Plan Note (Signed)
Chronic, ongoing.  BMP December with GFR 50.  Urine micro today 30 and A:C <30.  Continue ARB.  BMP next visit.

## 2018-04-26 NOTE — Assessment & Plan Note (Signed)
Continue to reduce Levemir by 2 units every 3 days based on morning BS readings, educated patient at length on this.

## 2018-06-03 ENCOUNTER — Other Ambulatory Visit: Payer: Self-pay | Admitting: Unknown Physician Specialty

## 2018-06-15 ENCOUNTER — Other Ambulatory Visit: Payer: Self-pay | Admitting: Unknown Physician Specialty

## 2018-06-15 NOTE — Telephone Encounter (Signed)
Requested Prescriptions  Pending Prescriptions Disp Refills  . LEVEMIR FLEXTOUCH 100 UNIT/ML Pen [Pharmacy Med Name: LEVEMIR FLEXTOUCH 100 UNIT/ML] 15 mL 4    Sig: INJECT 60 UNITS INTO THE SKIN AT BEDTIME.     Endocrinology:  Diabetes - Insulins Passed - 06/15/2018 12:11 PM      Passed - HBA1C is between 0 and 7.9 and within 180 days    Hemoglobin A1C  Date Value Ref Range Status  10/27/2015 7.3%  Final   HB A1C (BAYER DCA - WAIVED)  Date Value Ref Range Status  04/26/2018 6.1 <7.0 % Final    Comment:                                          Diabetic Adult            <7.0                                       Healthy Adult        4.3 - 5.7                                                           (DCCT/NGSP) American Diabetes Association's Summary of Glycemic Recommendations for Adults with Diabetes: Hemoglobin A1c <7.0%. More stringent glycemic goals (A1c <6.0%) may further reduce complications at the cost of increased risk of hypoglycemia.          Passed - Valid encounter within last 6 months    Recent Outpatient Visits          1 month ago Type 2 diabetes mellitus with stage 2 chronic kidney disease, with long-term current use of insulin ([place])   [place], [place] T, NP   4 months ago Acute cystitis without hematuria   [place], [place] T, NP   4 months ago Type 2 diabetes mellitus with stage 2 chronic kidney disease, with long-term current use of insulin ([place])   [place], [place] T, NP   7 months ago Type 2 diabetes mellitus with stage 2 chronic kidney disease, with long-term current use of insulin ([place])   [place], Adriana M, PA-C   11 months ago Type 2 diabetes mellitus with other specified complication, without long-term current use of insulin ([place])   [place] Kathrine Haddock, NP      Future Appointments            In 1 month Cannady, [name], NP [employer], [facility]

## 2018-06-19 ENCOUNTER — Ambulatory Visit (INDEPENDENT_AMBULATORY_CARE_PROVIDER_SITE_OTHER): Payer: Commercial Managed Care - PPO | Admitting: Nurse Practitioner

## 2018-06-19 ENCOUNTER — Other Ambulatory Visit: Payer: Self-pay

## 2018-06-19 ENCOUNTER — Encounter: Payer: Self-pay | Admitting: Nurse Practitioner

## 2018-06-19 ENCOUNTER — Other Ambulatory Visit: Payer: Commercial Managed Care - PPO

## 2018-06-19 ENCOUNTER — Telehealth: Payer: Self-pay | Admitting: Nurse Practitioner

## 2018-06-19 DIAGNOSIS — R300 Dysuria: Secondary | ICD-10-CM

## 2018-06-19 DIAGNOSIS — N3001 Acute cystitis with hematuria: Secondary | ICD-10-CM

## 2018-06-19 DIAGNOSIS — N390 Urinary tract infection, site not specified: Secondary | ICD-10-CM | POA: Insufficient documentation

## 2018-06-19 MED ORDER — CEPHALEXIN 500 MG PO CAPS: 500.0000 mg | ORAL_CAPSULE | Freq: Two times a day (BID) | ORAL | 0 refills | Status: AC

## 2018-06-19 NOTE — Assessment & Plan Note (Signed)
Acute with + RBC, mod bacteria, and leuks.  Script for Keflex sent.  Urine sent for culture and will adjust abx regimen based on findings.  Recommend use of Azo for symptoms management vs drinking cranberry juice (pt diabetic).  Increase fluid intake.  Due to recurrence over past 4 months have recommended taking Vitamin C tablet daily to help acidify urine.  Educated on vaginal atrophy with aging and risk for frequent UTI or vaginal infections.  Return for worsening or continued symptoms.

## 2018-06-19 NOTE — Telephone Encounter (Signed)
Copied from [place] [id number]. Topic: General - Inquiry >> Jun 19, 2018  8:08 AM Richardo Priest, NT wrote: Reason for CRM: Patient called in stating she believes she has a UTI and would like more medication to deal with this issue. Requesting a call back at [phone]

## 2018-06-19 NOTE — Telephone Encounter (Signed)
Lab scheduled for 10:30, visit for [name] scheduled at 2:00 pm today. Patient is Scientist, forensic.

## 2018-06-19 NOTE — Progress Notes (Signed)
Ht 5\' 5"  (1.651 m)   Wt 210 lb (95.3 kg)   LMP  (LMP Unknown)   BMI 34.95 kg/m    Subjective:    [name], female    DOB: [birth year], 63 y.o.   MRN: [id number]  HPI: April Johnson is a 63 y.o. female  Chief Complaint  Patient presents with  . Urinary Tract Infection    Pressure started bad this morning. Frequency has been going on for a few days.     . This visit was completed via WebEx due to the restrictions of the COVID-19 pandemic. All issues as above were discussed and addressed. Physical exam was done as above through visual confirmation on WebEx. If it was felt that the patient should be evaluated in the office, they were directed there. The patient verbally consented to this visit. . Location of the patient: home . Location of the provider: home . Those involved with this call:  . Provider: Marnee Guarneri, DNP . CMA: Yvonna Alanis, CMA . Front Desk/Registration: Don Perking  . Time spent on call: 15 minutes with patient face to face via video conference. More than 50% of this time was spent in counseling and coordination of care. 5 minutes total spent in review of patient's record and preparation of their chart.  URINARY SYMPTOMS Treated successfully last for UTI with Keflex on 04/09/17.  Symptoms started yesterday. Dysuria: no Urinary frequency: yes Urgency: yes Small volume voids: yes Symptom severity: moderate Urinary incontinence: no Foul odor: yes Hematuria: no Abdominal pain: no Back pain: no Suprapubic pain/pressure: yes Flank pain: no Fever:  no Vomiting: no Relief with cranberry juice: no Relief with pyridium: not taking Status: better/worse/stable Previous urinary tract infection: yes Recurrent urinary tract infection: no Sexual activity: monogamous History of sexually transmitted disease: no Treatments attempted: cranberry and increasing fluids   Relevant past medical, surgical, family and social history  reviewed and updated as indicated. Interim medical history since our last visit reviewed. Allergies and medications reviewed and updated.  Review of Systems  Constitutional: Negative for activity change, appetite change, diaphoresis, fatigue and fever.  Respiratory: Negative for cough, chest tightness and shortness of breath.   Cardiovascular: Negative for chest pain, palpitations and leg swelling.  Gastrointestinal: Negative for abdominal distention, abdominal pain, constipation, diarrhea, nausea and vomiting.  Genitourinary: Positive for decreased urine volume, frequency and urgency. Negative for dyspareunia, dysuria, flank pain, hematuria and vaginal discharge.  Neurological: Negative for dizziness, syncope, weakness and light-headedness.  Psychiatric/Behavioral: Negative.     Per HPI unless specifically indicated above     Objective:    Ht 5\' 5"  (1.651 m)   Wt 210 lb (95.3 kg)   LMP  (LMP Unknown)   BMI 34.95 kg/m   Wt Readings from Last 3 Encounters:  06/19/18 210 lb (95.3 kg)  04/26/18 212 lb 2 oz (96.2 kg)  02/13/18 208 lb 4 oz (94.5 kg)    Physical Exam Vitals signs and nursing note reviewed.  Constitutional:      General: She is awake. She is not in acute distress.    Appearance: She is well-developed. She is obese. She is not ill-appearing.  HENT:     Head: Normocephalic.     Right Ear: Hearing normal.     Left Ear: Hearing normal.     Nose: Nose normal.  Eyes:     General:        Right eye: No discharge.  Left eye: No discharge.     Conjunctiva/sclera: Conjunctivae normal.  Neck:     Musculoskeletal: Normal range of motion.  Cardiovascular:     Comments: Unable to auscultate due to virtual visit Pulmonary:     Effort: Pulmonary effort is normal. No accessory muscle usage or respiratory distress.     Comments: Unable to auscultate due to virtual visit Abdominal:     Palpations: Abdomen is soft.     Tenderness: There is abdominal tenderness in the  suprapubic area. There is no right CVA tenderness or left CVA tenderness.     Comments: Self palpation by patient, reports suprapubic tenderness.  States no pain in back.    Neurological:     Mental Status: She is alert and oriented to person, place, and time.  Psychiatric:        Mood and Affect: Mood normal.        Behavior: Behavior normal. Behavior is cooperative.        Thought Content: Thought content normal.        Judgment: Judgment normal.     Results for orders placed or performed in visit on 06/19/18  Microscopic Examination  Result Value Ref Range   WBC, UA >30 (A) 0 - 5 /hpf   RBC 0-2 0 - 2 /hpf   Epithelial Cells (non renal) 0-10 0 - 10 /hpf   Bacteria, UA Moderate (A) None seen/Few   Yeast, UA Present None seen  Urine Culture, Reflex  Result Value Ref Range   Urine Culture, Routine WILL FOLLOW   UA/M w/rflx Culture, Routine  Result Value Ref Range   Specific Gravity, UA 1.010 1.005 - 1.030   pH, UA 5.5 5.0 - 7.5   Color, UA Yellow Yellow   Appearance Ur Cloudy (A) Clear   Leukocytes,UA 1+ (A) Negative   Protein,UA Trace (A) Negative/Trace   Glucose, UA 3+ (A) Negative   Ketones, UA Negative Negative   RBC, UA Trace (A) Negative   Bilirubin, UA Negative Negative   Urobilinogen, Ur 0.2 0.2 - 1.0 mg/dL   Nitrite, UA Negative Negative   Microscopic Examination See below:    Urinalysis Reflex Comment       Assessment & Plan:   Problem List Items Addressed This Visit      Genitourinary   Urinary tract infection    Acute with + RBC, mod bacteria, and leuks.  Script for Keflex sent.  Urine sent for culture and will adjust abx regimen based on findings.  Recommend use of Azo for symptoms management vs drinking cranberry juice (pt diabetic).  Increase fluid intake.  Due to recurrence over past 4 months have recommended taking Vitamin C tablet daily to help acidify urine.  Educated on vaginal atrophy with aging and risk for frequent UTI or vaginal infections.   Return for worsening or continued symptoms.      Relevant Medications   cephALEXin (KEFLEX) 500 MG capsule      I discussed the assessment and treatment plan with the patient. The patient was provided an opportunity to ask questions and all were answered. The patient agreed with the plan and demonstrated an understanding of the instructions.   The patient was advised to call back or seek an in-person evaluation if the symptoms worsen or if the condition fails to improve as anticipated.   Follow up plan: Return if symptoms worsen or fail to improve.

## 2018-06-19 NOTE — Telephone Encounter (Signed)
Patient will need to provide urine sample and have virtual visit.

## 2018-06-19 NOTE — Patient Instructions (Signed)
Urinary Tract Infection, Adult A urinary tract infection (UTI) is an infection of any part of the urinary tract. The urinary tract includes:  The kidneys.  The ureters.  The bladder.  The urethra. These organs make, store, and get rid of pee (urine) in the body. What are the causes? This is caused by germs (bacteria) in your genital area. These germs grow and cause swelling (inflammation) of your urinary tract. What increases the risk? You are more likely to develop this condition if:  You have a small, thin tube (catheter) to drain pee.  You cannot control when you pee or poop (incontinence).  You are female, and: ? You use these methods to prevent pregnancy: ? A medicine that kills sperm (spermicide). ? A device that blocks sperm (diaphragm). ? You have low levels of a female hormone (estrogen). ? You are pregnant.  You have genes that add to your risk.  You are sexually active.  You take antibiotic medicines.  You have trouble peeing because of: ? A prostate that is bigger than normal, if you are female. ? A blockage in the part of your body that drains pee from the bladder (urethra). ? A kidney stone. ? A nerve condition that affects your bladder (neurogenic bladder). ? Not getting enough to drink. ? Not peeing often enough.  You have other conditions, such as: ? Diabetes. ? A weak disease-fighting system (immune system). ? Sickle cell disease. ? Gout. ? Injury of the spine. What are the signs or symptoms? Symptoms of this condition include:  Needing to pee right away (urgently).  Peeing often.  Peeing small amounts often.  Pain or burning when peeing.  Blood in the pee.  Pee that smells bad or not like normal.  Trouble peeing.  Pee that is cloudy.  Fluid coming from the vagina, if you are female.  Pain in the belly or lower back. Other symptoms include:  Throwing up (vomiting).  No urge to eat.  Feeling mixed up (confused).  Being tired  and grouchy (irritable).  A fever.  Watery poop (diarrhea). How is this treated? This condition may be treated with:  Antibiotic medicine.  Other medicines.  Drinking enough water. Follow these instructions at home:  Medicines  Take over-the-counter and prescription medicines only as told by your doctor.  If you were prescribed an antibiotic medicine, take it as told by your doctor. Do not stop taking it even if you start to feel better. General instructions  Make sure you: ? Pee until your bladder is empty. ? Do not hold pee for a long time. ? Empty your bladder after sex. ? Wipe from front to back after pooping if you are a female. Use each tissue one time when you wipe.  Drink enough fluid to keep your pee pale yellow.  Keep all follow-up visits as told by your doctor. This is important. Contact a doctor if:  You do not get better after 1-2 days.  Your symptoms go away and then come back. Get help right away if:  You have very bad back pain.  You have very bad pain in your lower belly.  You have a fever.  You are sick to your stomach (nauseous).  You are throwing up. Summary  A urinary tract infection (UTI) is an infection of any part of the urinary tract.  This condition is caused by germs in your genital area.  There are many risk factors for a UTI. These include having a small, thin   tube to drain pee and not being able to control when you pee or poop.  Treatment includes antibiotic medicines for germs.  Drink enough fluid to keep your pee pale yellow. This information is not intended to replace advice given to you by your health care provider. Make sure you discuss any questions you have with your health care provider. Document Released: 08/10/2007 Document Revised: 08/31/2017 Document Reviewed: 08/31/2017 Elsevier Interactive Patient Education  2019 [employer]vier Inc.  

## 2018-06-21 ENCOUNTER — Other Ambulatory Visit: Payer: Self-pay | Admitting: Nurse Practitioner

## 2018-06-21 LAB — MICROSCOPIC EXAMINATION: WBC, UA: >30 /hpf — AB (ref 0–5)

## 2018-06-21 LAB — UA/M W/RFLX CULTURE, ROUTINE
Bilirubin, UA: NEGATIVE
Ketones, UA: NEGATIVE
Nitrite, UA: NEGATIVE
Specific Gravity, UA: 1.01 (ref 1.005–1.030)
Urobilinogen, Ur: 0.2 mg/dL (ref 0.2–1.0)
pH, UA: 5.5 (ref 5.0–7.5)

## 2018-06-21 LAB — URINE CULTURE, REFLEX

## 2018-06-21 NOTE — Telephone Encounter (Signed)
Pt has pick up RX 06/19/2018. Per pharmacist order was entered in error for refill.

## 2018-07-06 ENCOUNTER — Other Ambulatory Visit: Payer: Self-pay | Admitting: Nurse Practitioner

## 2018-07-06 NOTE — Telephone Encounter (Signed)
Copied from [place] [id number]. Topic: Quick Communication - Rx Refill/Question >> Jul 06, 2018 11:07 AM Percell Belt A wrote: Medication: ondansetron (ZOFRAN) 4 MG tablet [[id number]] - pt stated she is out of the med, her sugar meds makes her feel sick  Has the patient contacted their pharmacy? No. (Agent: If no, request that the patient contact the pharmacy for the refill.) (Agent: If yes, when and what did the pharmacy advise?)  Preferred Pharmacy (with phone number or street name): [id number] - [place], [place] [phone] (Phone)   Agent: Please be advised that RX refills may take up to 3 business days. We ask that you follow-up with your pharmacy.

## 2018-07-11 ENCOUNTER — Other Ambulatory Visit: Payer: Self-pay | Admitting: Nurse Practitioner

## 2018-07-11 ENCOUNTER — Other Ambulatory Visit: Payer: Commercial Managed Care - PPO

## 2018-07-11 ENCOUNTER — Telehealth: Payer: Self-pay | Admitting: Nurse Practitioner

## 2018-07-11 DIAGNOSIS — R300 Dysuria: Secondary | ICD-10-CM

## 2018-07-11 LAB — UA/M W/RFLX CULTURE, ROUTINE
Bilirubin, UA: NEGATIVE
Ketones, UA: NEGATIVE
Leukocytes,UA: NEGATIVE
Nitrite, UA: NEGATIVE
Protein,UA: NEGATIVE
RBC, UA: NEGATIVE
Specific Gravity, UA: 1.01 (ref 1.005–1.030)
Urobilinogen, Ur: 0.2 mg/dL (ref 0.2–1.0)
pH, UA: 5 (ref 5.0–7.5)

## 2018-07-11 NOTE — Progress Notes (Signed)
Patient complaint of urinary symptoms, will obtain outpatient urine and review results.  Recent UTI treatment with Keflex in April.

## 2018-07-11 NOTE — Telephone Encounter (Signed)
If need we can place her for virtual visit in morning as well.

## 2018-07-11 NOTE — Telephone Encounter (Signed)
Pt called in stating that urine is feeling feverish and she is hurting like she may have kidney stones. Per [name] advised pt to come and give a urine.

## 2018-07-11 NOTE — Addendum Note (Signed)
Addended by: Marnee Guarneri T on: 07/11/2018 02:22 PM   Modules accepted: Orders

## 2018-07-13 ENCOUNTER — Telehealth: Payer: Self-pay | Admitting: Nurse Practitioner

## 2018-07-13 MED ORDER — METHOCARBAMOL 500 MG PO TABS: 500.0000 mg | ORAL_TABLET | Freq: Two times a day (BID) | ORAL | 0 refills | Status: DC | PRN

## 2018-07-13 NOTE — Telephone Encounter (Signed)
She believes she pulled a muscle in her lower back and is requesting a muscle relaxer. Allergic to Flexeril, will send in Robaxin 500 MG she can take twice daily as needed for discomfort.  Recommend she take Tylenol as needed and alternate between heat/ice at home + do gentle stretches + rest over weekend.  Discussed with her that if worsening pain over weekend she is immediately to go to [facility] or ER for further face to face evaluation.

## 2018-07-13 NOTE — Telephone Encounter (Signed)
Patient is still having the lower back pain and is wanting to know if you could give her something to help her with this. She has an appt Monday   [facility]  Thank you

## 2018-07-14 ENCOUNTER — Other Ambulatory Visit: Payer: Self-pay | Admitting: Unknown Physician Specialty

## 2018-07-16 ENCOUNTER — Other Ambulatory Visit: Payer: Self-pay

## 2018-07-16 ENCOUNTER — Ambulatory Visit (INDEPENDENT_AMBULATORY_CARE_PROVIDER_SITE_OTHER): Payer: Commercial Managed Care - PPO | Admitting: Nurse Practitioner

## 2018-07-16 ENCOUNTER — Encounter: Payer: Self-pay | Admitting: Nurse Practitioner

## 2018-07-16 DIAGNOSIS — E785 Hyperlipidemia, unspecified: Secondary | ICD-10-CM

## 2018-07-16 DIAGNOSIS — E1169 Type 2 diabetes mellitus with other specified complication: Secondary | ICD-10-CM

## 2018-07-16 DIAGNOSIS — E1122 Type 2 diabetes mellitus with diabetic chronic kidney disease: Secondary | ICD-10-CM | POA: Diagnosis not present

## 2018-07-16 DIAGNOSIS — M545 Low back pain, unspecified: Secondary | ICD-10-CM | POA: Insufficient documentation

## 2018-07-16 DIAGNOSIS — Z794 Long term (current) use of insulin: Secondary | ICD-10-CM

## 2018-07-16 DIAGNOSIS — I1310 Hypertensive heart and chronic kidney disease without heart failure, with stage 1 through stage 4 chronic kidney disease, or unspecified chronic kidney disease: Secondary | ICD-10-CM

## 2018-07-16 DIAGNOSIS — B372 Candidiasis of skin and nail: Secondary | ICD-10-CM | POA: Insufficient documentation

## 2018-07-16 DIAGNOSIS — N183 Chronic kidney disease, stage 3 unspecified: Secondary | ICD-10-CM

## 2018-07-16 DIAGNOSIS — N182 Chronic kidney disease, stage 2 (mild): Secondary | ICD-10-CM

## 2018-07-16 LAB — LIPID PANEL PICCOLO, WAIVED
Chol/HDL Ratio Piccolo,Waive: 2.7 mg/dL
Cholesterol Piccolo, Waived: 162 mg/dL (ref <200)
HDL Chol Piccolo, Waived: 61 mg/dL (ref >59)
LDL Chol Calc Piccolo Waived: 39 mg/dL (ref <100)
Triglycerides Piccolo,Waived: 314 mg/dL — ABNORMAL HIGH (ref <150)
VLDL Chol Calc Piccolo,Waive: 63 mg/dL — ABNORMAL HIGH (ref <30)

## 2018-07-16 LAB — BAYER DCA HB A1C WAIVED: HB A1C (BAYER DCA - WAIVED): 6.4 % (ref <7.0)

## 2018-07-16 MED ORDER — FLUCONAZOLE 150 MG PO TABS: 150.0000 mg | ORAL_TABLET | Freq: Once | ORAL | 0 refills | Status: AC

## 2018-07-16 MED ORDER — NYSTATIN 100000 UNIT/GM EX POWD: Freq: Two times a day (BID) | CUTANEOUS | 0 refills | Status: DC

## 2018-07-16 NOTE — Assessment & Plan Note (Signed)
Chronic, ongoing.  Continue current medication regimen.  Advised to obtain BP cuff and check BP in mornings at home.  Labs outpatient this week for CMP.

## 2018-07-16 NOTE — Patient Instructions (Signed)

## 2018-07-16 NOTE — Assessment & Plan Note (Signed)
Chronic, ongoing.  Continue current medication regimen.  Obtain outpatient labs this week.

## 2018-07-16 NOTE — Assessment & Plan Note (Signed)
Chronic, ongoing.  Reiterated importance of decreasing Levemir by 2 units every 3 days based on morning blood sugar readings.  She is currently taking Levemir 40 units every other day.  Will place CCM referral for further guidance and education.  Continue Ozempic, Invokana, and Actoplus.  Monitor cardiac health closely with Actos.  Obtain outpatient labs this week.  Return in 3 months.

## 2018-07-16 NOTE — Progress Notes (Signed)
LMP  (LMP Unknown)    Subjective:    [name], female    DOB: Jun 23, 1955, 63 y.o.   MRN: [id number]  HPI: April Johnson is a 63 y.o. female  Chief Complaint  Patient presents with  . Hyperlipidemia  . Hypertension  . Diabetes  . Back Pain    Back pain is better but still sometimes has sharp pain.    . This visit was completed via WebEx due to the restrictions of the COVID-19 pandemic. All issues as above were discussed and addressed. Physical exam was done as above through visual confirmation on WebEx. If it was felt that the patient should be evaluated in the office, they were directed there. The patient verbally consented to this visit. . Location of the patient: home . Location of the provider: home . Those involved with this call:  . Provider: Marnee Guarneri, DNP . CMA: Merilyn Baba, CMA . Front Desk/Registration: Jill Side  . Time spent on call: 15 minutes with patient face to face via video conference. More than 50% of this time was spent in counseling and coordination of care. 10 minutes total spent in review of patient's record and preparation of their chart. I verified patient identity using two factors (patient name and date of birth). Patient consents verbally to being seen via telemedicine visit today.    DIABETES Last A1C 6.1%.  Reiterated to patient at last visit need to decrease Levemir by 2 units every 3 days based on morning blood sugar readings.  She continues on Ozempic 0.75 MG weekly, Actoplus, and Invokana.  Current Levemir dose is 40 units every other day, she reports she has not reduced dose but is reducing amount of days she takes it.  Current GFR 50. Hypoglycemic episodes:no Polydipsia/polyuria: no Visual disturbance: no Chest pain: no Paresthesias: no Glucose Monitoring: yes  Accucheck frequency: Daily  Fasting glucose: 130 range  Post prandial:  Evening:  Before meals: Taking Insulin?: yes  Long acting  insulin: 40 units Levemir every other day  Short acting insulin: Blood Pressure Monitoring: not checking Retinal Examination: Up to Date Foot Exam: Up to Date Pneumovax: Up to Date Influenza: Up to Date Aspirin: yes   HYPERTENSION / HYPERLIPIDEMIA Continues on Micardis, Metoprolol, Amlodipine, and Lipitor. Satisfied with current treatment? yes Duration of hypertension: chronic BP monitoring frequency: not checking BP range:  BP medication side effects: no Duration of hyperlipidemia: chronic Cholesterol medication side effects: no Cholesterol supplements: none Medication compliance: good compliance Aspirin: yes Recent stressors: no Recurrent headaches: no Visual changes: no Palpitations: no Dyspnea: no Chest pain: no Lower extremity edema: no Dizzy/lightheaded: no  BACK PAIN Had UA performed last week with negative blood, nitrites, and leuks.  Last treatment for UTI 06/19/2018.  She felt it could be muscle strain as she was lifting something last week and felt like she pulled a muscle in back.  Was given Robaxin, which she reports is helping.  States she only feels tightness. Duration: days Mechanism of injury: lifting Location: Left and low back Onset: gradual Severity: mild Quality: aching Frequency: intermittent Radiation: none Aggravating factors: lifting and movement Alleviating factors: ice, heat, APAP and muscle relaxer Status: better Treatments attempted: rest, ice, heat, APAP and aleve  Relief with NSAIDs?: mild Nighttime pain:  no Paresthesias / decreased sensation:  no Bowel / bladder incontinence:  no Fevers:  no Dysuria / urinary frequency:  no   YEAST INFECTION: Reports she is starting to have a yeast infection  again, stomach, thighs, and left breast.  Reports in past she has used Nystatin under breasts and Diflucan.  Relevant past medical, surgical, family and social history reviewed and updated as indicated. Interim medical history since our last  visit reviewed. Allergies and medications reviewed and updated.  Review of Systems  Constitutional: Negative for activity change, appetite change, diaphoresis, fatigue and fever.  Respiratory: Negative for cough, chest tightness and shortness of breath.   Cardiovascular: Negative for chest pain, palpitations and leg swelling.  Gastrointestinal: Negative for abdominal distention, abdominal pain, constipation, diarrhea, nausea and vomiting.  Endocrine: Negative for cold intolerance, heat intolerance, polydipsia, polyphagia and polyuria.  Neurological: Negative for dizziness, syncope, weakness, light-headedness, numbness and headaches.  Psychiatric/Behavioral: Negative.     Per HPI unless specifically indicated above     Objective:    LMP  (LMP Unknown)   Wt Readings from Last 3 Encounters:  06/19/18 210 lb (95.3 kg)  04/26/18 212 lb 2 oz (96.2 kg)  02/13/18 208 lb 4 oz (94.5 kg)    Physical Exam Vitals signs and nursing note reviewed.  Constitutional:      General: She is awake.     Appearance: She is well-developed.  HENT:     Head: Normocephalic.     Right Ear: Hearing normal.     Left Ear: Hearing normal.     Nose: Nose normal.  Eyes:     General: Lids are normal.        Right eye: No discharge.        Left eye: No discharge.     Conjunctiva/sclera: Conjunctivae normal.  Neck:     Musculoskeletal: Normal range of motion.  Cardiovascular:     Comments: Unable to auscultate due to virtual visit only. Pulmonary:     Effort: Pulmonary effort is normal. No accessory muscle usage or respiratory distress.     Comments: Unable to auscultate due to virtual visit only.  Talkative without SOB. Musculoskeletal:     Lumbar back: She exhibits normal range of motion, no tenderness, no pain and no spasm.     Comments: Able to view virtually: Full ROM lower back present. No rashes noted.  She reports no tenderness on self palpation or pain.    Neurological:     Mental Status: She is  alert and oriented to person, place, and time.  Psychiatric:        Attention and Perception: Attention normal.        Mood and Affect: Mood normal.        Behavior: Behavior normal. Behavior is cooperative.        Thought Content: Thought content normal.        Judgment: Judgment normal.     Results for orders placed or performed in visit on 07/11/18  UA/M w/rflx Culture, Routine  Result Value Ref Range   Specific Gravity, UA 1.010 1.005 - 1.030   pH, UA 5.0 5.0 - 7.5   Color, UA Yellow Yellow   Appearance Ur Clear Clear   Leukocytes,UA Negative Negative   Protein,UA Negative Negative/Trace   Glucose, UA 3+ (A) Negative   Ketones, UA Negative Negative   RBC, UA Negative Negative   Bilirubin, UA Negative Negative   Urobilinogen, Ur 0.2 0.2 - 1.0 mg/dL   Nitrite, UA Negative Negative      Assessment & Plan:   Problem List Items Addressed This Visit      Cardiovascular and Mediastinum   Hypertensive heart/kidney disease without HF and  with CKD stage III (HCC)    Chronic, ongoing.  Continue current medication regimen.  Advised to obtain BP cuff and check BP in mornings at home.  Labs outpatient this week for CMP.        Relevant Orders   Comprehensive metabolic panel     Endocrine   Diabetes mellitus ([place]) - Primary    Chronic, ongoing.  Reiterated importance of decreasing Levemir by 2 units every 3 days based on morning blood sugar readings.  She is currently taking Levemir 40 units every other day.  Will place CCM referral for further guidance and education.  Continue Ozempic, Invokana, and Actoplus.  Monitor cardiac health closely with Actos.  Obtain outpatient labs this week.  Return in 3 months.      Relevant Orders   Bayer Munjor Hb A1c Waived   Ambulatory referral to Chronic Care Management Services   Hyperlipidemia associated with type 2 diabetes mellitus (HCC)    Chronic, ongoing.  Continue current medication regimen.  Obtain outpatient labs this week.       Relevant Orders   Lipid Panel Piccolo, Waived     Musculoskeletal and Integument   Yeast infection of the skin    Acute, scripts for Diflucan and Nystatin powder sent.  Recommend to hold Lipitor for 48 hours after taking Diflucan due to risk for interaction.  Return for continued or worsening symptoms.      Relevant Medications   fluconazole (DIFLUCAN) 150 MG tablet   nystatin (MYCOSTATIN/NYSTOP) powder     Other   Lower back pain    Acute and improving.  Continue Robaxin and Tylenol as needed.  Advised against use of Aleeve due to kidney function.  Continue heat, ice, and rest.  Gentle stretches only.  Return if continued or worsening.           I discussed the assessment and treatment plan with the patient. The patient was provided an opportunity to ask questions and all were answered. The patient agreed with the plan and demonstrated an understanding of the instructions.   The patient was advised to call back or seek an in-person evaluation if the symptoms worsen or if the condition fails to improve as anticipated.   I provided 15 minutes of time during this encounter.  Follow up plan: Return in about 3 months (around 10/16/2018) for T2DM, HTN/HLD, Depression.

## 2018-07-16 NOTE — Assessment & Plan Note (Signed)
Acute and improving.  Continue Robaxin and Tylenol as needed.  Advised against use of Aleeve due to kidney function.  Continue heat, ice, and rest.  Gentle stretches only.  Return if continued or worsening.

## 2018-07-16 NOTE — Assessment & Plan Note (Signed)
Acute, scripts for Diflucan and Nystatin powder sent.  Recommend to hold Lipitor for 48 hours after taking Diflucan due to risk for interaction.  Return for continued or worsening symptoms.

## 2018-07-17 ENCOUNTER — Other Ambulatory Visit: Payer: Self-pay | Admitting: Nurse Practitioner

## 2018-07-17 ENCOUNTER — Telehealth: Payer: Self-pay | Admitting: Nurse Practitioner

## 2018-07-17 LAB — COMPREHENSIVE METABOLIC PANEL
ALT: 11 IU/L (ref 0–32)
AST: 13 IU/L (ref 0–40)
Albumin/Globulin Ratio: 1.9 (ref 1.2–2.2)
Albumin: 4.5 g/dL (ref 3.8–4.8)
Alkaline Phosphatase: 80 IU/L (ref 39–117)
BUN/Creatinine Ratio: 21 (ref 12–28)
BUN: 26 mg/dL (ref 8–27)
Bilirubin Total: 0.4 mg/dL (ref 0.0–1.2)
CO2: 26 mmol/L (ref 20–29)
Calcium: 10.3 mg/dL (ref 8.7–10.3)
Chloride: 97 mmol/L (ref 96–106)
Creatinine, Ser: 1.21 mg/dL — ABNORMAL HIGH (ref 0.57–1.00)
GFR calc Af Amer: 56 mL/min/{1.73_m2} — ABNORMAL LOW (ref >59)
GFR calc non Af Amer: 48 mL/min/{1.73_m2} — ABNORMAL LOW (ref >59)
Globulin, Total: 2.4 g/dL (ref 1.5–4.5)
Glucose: 121 mg/dL — ABNORMAL HIGH (ref 65–99)
Potassium: 4.4 mmol/L (ref 3.5–5.2)
Sodium: 139 mmol/L (ref 134–144)
Total Protein: 6.9 g/dL (ref 6.0–8.5)

## 2018-07-17 MED ORDER — TELMISARTAN 80 MG PO TABS: 80.0000 mg | ORAL_TABLET | Freq: Every day | ORAL | 2 refills | Status: DC

## 2018-07-17 NOTE — Telephone Encounter (Signed)
Reviewed lab results with patient.  Reiterated need to decrease insulin as per previous instructions.  A1C 6.4%.  Discussed lipid panel results.  Reviewed current kidney function.  Discussed discontinuing HCTZ and placing on Telmisartan only as BP well-controlled over several months, 110-120/70's at visits.  Patient agreed with plan of care.  Will recheck kidney function next visit or sooner if UTI symptoms present.

## 2018-07-17 NOTE — Progress Notes (Signed)
Discontinuing Telmisartan-HCTZ 80-25 due to current kidney function and tightly controlled BP.  Switch to Telmisartan 80 MG daily only.  Recheck kidney function next visit.

## 2018-07-24 ENCOUNTER — Ambulatory Visit: Payer: Self-pay | Admitting: Pharmacist

## 2018-07-24 ENCOUNTER — Telehealth: Payer: Self-pay

## 2018-07-24 NOTE — Chronic Care Management (AMB) (Signed)
  Chronic Care Management   Note  07/24/2018 Name: JAELYNE DEEG MRN: [id number] DOB: 03/19/1955  Patient is a 63 year old female followed by Marnee Guarneri, NP for primary care, referred to chronic care management for support in managing diabetes, HTN, HLD, neuropathy, CKD.   Contacted patient to discuss CCM program; left HIPAA compliant message to return my call at her convenience.   Follow up plan: - If I have not heard back, will follow up within the next 2 week.   Catie Darnelle Maffucci, PharmD Clinical Pharmacist [place] [phone]

## 2018-07-25 ENCOUNTER — Ambulatory Visit: Payer: Commercial Managed Care - PPO | Admitting: *Deleted

## 2018-07-25 DIAGNOSIS — E785 Hyperlipidemia, unspecified: Secondary | ICD-10-CM

## 2018-07-25 DIAGNOSIS — E1169 Type 2 diabetes mellitus with other specified complication: Secondary | ICD-10-CM

## 2018-07-25 DIAGNOSIS — E1122 Type 2 diabetes mellitus with diabetic chronic kidney disease: Secondary | ICD-10-CM

## 2018-07-25 DIAGNOSIS — N182 Chronic kidney disease, stage 2 (mild): Secondary | ICD-10-CM

## 2018-07-25 NOTE — Chronic Care Management (AMB) (Signed)
  Chronic Care Management   Initial Visit Note  07/25/2018 Name: April Johnson MRN: [id number] DOB: 09/10/1955  Referred by: Venita Lick, NP Reason for referral : Chronic Care Management (Introduction to CCM services )   April Johnson is a 64 y.o. year old female who is a primary care patient of Cannady, [name], NP. The CCM team was consulted for assistance with chronic disease management and care coordination needs.   Review of patient status, including review of consultants reports, relevant laboratory and other test results, and collaboration with appropriate care team members and the patient's provider was performed as part of comprehensive patient evaluation and provision of chronic care management services.    SDOH (Social Determinants of Health) screening performed today. See Care Plan Entry related to challenges with: None    Ms. Kutter was given information about Chronic Care Management services today including:  1. CCM service includes personalized support from designated clinical staff supervised by her physician, including individualized plan of care and coordination with other care providers 2. 24/7 contact phone numbers for assistance for urgent and routine care needs. 3. Service will only be billed when office clinical staff spend 20 minutes or more in a month to coordinate care. 4. Only one practitioner may furnish and bill the service in a calendar month. 5. The patient may stop CCM services at any time (effective at the end of the month) by phone call to the office staff. 6. The patient will be responsible for cost sharing (co-pay) of up to 20% of the service fee (after annual deductible is met).  Patient did not agree to services and does not wish to consider at this time.  Plan:   The patient has been provided with contact information for the chronic care management team and has been advised to call with any health related questions or concerns.    Merlene Morse Lian Pounds RN, BSN Nurse Case Editor, commissioning [facility]  [phone]) Business Mobile

## 2018-07-27 ENCOUNTER — Telehealth: Payer: Self-pay

## 2018-08-01 ENCOUNTER — Other Ambulatory Visit: Payer: Self-pay | Admitting: Nurse Practitioner

## 2018-08-01 NOTE — Telephone Encounter (Signed)
Requested medication (s) are due for refill today: yes  Requested medication (s) are on the active medication list: yes  Notes to clinic:  Please see pharmacy request for an alternative prescription. Request states that medication is no longer covered.     Requested Prescriptions  Pending Prescriptions Disp Refills   FARXIGA 5 MG TABS tablet [Pharmacy Med Name: FARXIGA 5 MG TABLET]  0    Sig: Please specify directions, refills and quantity     Endocrinology:  Diabetes - SGLT2 Inhibitors Failed - 08/01/2018 12:34 PM      Failed - Cr in normal range and within 360 days    Creatinine, Ser  Date Value Ref Range Status  07/16/2018 1.21 (H) 0.57 - 1.00 mg/dL Final         Failed - eGFR in normal range and within 360 days    GFR calc Af Amer  Date Value Ref Range Status  07/16/2018 56 (L) >59 mL/min/1.73 Final   GFR calc non Af Amer  Date Value Ref Range Status  07/16/2018 48 (L) >59 mL/min/1.73 Final         Passed - LDL in normal range and within 360 days    LDL Calculated  Date Value Ref Range Status  01/24/2018 63 0 - 99 mg/dL Final         Passed - HBA1C is between 0 and 7.9 and within 180 days    Hemoglobin A1C  Date Value Ref Range Status  10/27/2015 7.3%  Final   HB A1C (BAYER DCA - WAIVED)  Date Value Ref Range Status  07/16/2018 6.4 <7.0 % Final    Comment:                                          Diabetic Adult            <7.0                                       Healthy Adult        4.3 - 5.7                                                           (DCCT/NGSP) American Diabetes Association's Summary of Glycemic Recommendations for Adults with Diabetes: Hemoglobin A1c <7.0%. More stringent glycemic goals (A1c <6.0%) may further reduce complications at the cost of increased risk of hypoglycemia.          Passed - Valid encounter within last 6 months    Recent Outpatient Visits          2 weeks ago Type 2 diabetes mellitus with stage 2 chronic kidney  disease, with long-term current use of insulin ([place])   [place], [place] T, NP   1 month ago Acute cystitis with hematuria   [place], [name] T, NP   3 months ago Type 2 diabetes mellitus with stage 2 chronic kidney disease, with long-term current use of insulin ([place])   [place], [place] T, NP   5 months ago Acute cystitis without hematuria  [facility] [place], [place] T, NP   6 months ago Type 2 diabetes mellitus with stage 2 chronic kidney disease, with long-term current use of insulin ([place])   [place], [name], NP      Future Appointments            In 1 month Cannady, [name], NP [facility]

## 2018-08-16 ENCOUNTER — Other Ambulatory Visit: Payer: Self-pay | Admitting: Nurse Practitioner

## 2018-08-27 ENCOUNTER — Other Ambulatory Visit: Payer: Self-pay | Admitting: Nurse Practitioner

## 2018-08-30 ENCOUNTER — Other Ambulatory Visit: Payer: Self-pay | Admitting: Nurse Practitioner

## 2018-08-30 NOTE — Telephone Encounter (Signed)
Pt is wanting a refill on zofran. Please advise

## 2018-09-03 ENCOUNTER — Other Ambulatory Visit: Payer: Self-pay | Admitting: Unknown Physician Specialty

## 2018-09-03 ENCOUNTER — Ambulatory Visit: Payer: Self-pay | Admitting: *Deleted

## 2018-09-03 NOTE — Telephone Encounter (Signed)
Pt called in stating she has been exposed to her sister who just found out she is positive for COVID-19. Pt without symptoms but requesting to be tested.  I warm transferred the call into the office of [facility] for scheduling.    Reason for Disposition . [1] COVID-19 EXPOSURE (Close Contact) AND [2] within last 14 days BUT [3] NO symptoms  Answer Assessment - Initial Assessment Questions 1. CLOSE CONTACT: "Who is the person with the confirmed or suspected COVID-19 infection that you were exposed to?"     Sister is positive.     2. PLACE of CONTACT: "Where were you when you were exposed to COVID-19?" (e.g., home, school, medical waiting room; which city?)     Last week she came by.   She has been out of work sick and found out this morning she is positive for DOVID-19. 3. TYPE of CONTACT: "How much contact was there?" (e.g., sitting next to, live in same house, work in same office, same building)     She came by my house. 4. DURATION of CONTACT: "How long were you in contact with the COVID-19 patient?" (e.g., a few seconds, passed by person, a few minutes, live with the patient)     I've been around her. 5. DATE of CONTACT: "When did you have contact with a COVID-19 patient?" (e.g., how many days ago)     Last week. 6. TRAVEL: "Have you traveled out of the country recently?" If so, "When and where?"     * Also ask about out-of-state travel, since the CDC has identified some high-risk cities for community spread in the [place].     * Note: Travel becomes less relevant if there is widespread community transmission where the patient lives.     No 7. COMMUNITY SPREAD: "Are there lots of cases of COVID-19 (community spread) where you live?" (See public health department website, if unsure)       Yes 8. SYMPTOMS: "Do you have any symptoms?" (e.g., fever, cough, breathing difficulty)     No 9. PREGNANCY OR POSTPARTUM: "Is there any chance you are pregnant?" "When was your last  menstrual period?" "Did you deliver in the last 2 weeks?"     N/A due to age 63. HIGH RISK: "Do you have any heart or lung problems? Do you have a weak immune system?" (e.g., CHF, COPD, asthma, HIV positive, chemotherapy, renal failure, diabetes mellitus, sickle cell anemia)       Diabetes , hypertension.  Protocols used: CORONAVIRUS (COVID-19) EXPOSURE-A-AH

## 2018-09-04 ENCOUNTER — Encounter: Payer: Self-pay | Admitting: Nurse Practitioner

## 2018-09-04 ENCOUNTER — Ambulatory Visit (INDEPENDENT_AMBULATORY_CARE_PROVIDER_SITE_OTHER): Payer: Commercial Managed Care - PPO | Admitting: Nurse Practitioner

## 2018-09-04 ENCOUNTER — Other Ambulatory Visit: Payer: Commercial Managed Care - PPO

## 2018-09-04 ENCOUNTER — Telehealth: Payer: Self-pay | Admitting: *Deleted

## 2018-09-04 ENCOUNTER — Other Ambulatory Visit: Payer: Self-pay

## 2018-09-04 ENCOUNTER — Telehealth: Payer: Self-pay | Admitting: Unknown Physician Specialty

## 2018-09-04 DIAGNOSIS — Z20822 Contact with and (suspected) exposure to covid-19: Secondary | ICD-10-CM

## 2018-09-04 DIAGNOSIS — Z20828 Contact with and (suspected) exposure to other viral communicable diseases: Secondary | ICD-10-CM

## 2018-09-04 NOTE — Telephone Encounter (Signed)
Requested medication (s) are due for refill today: yes  Requested medication (s) are on the active medication list: yes  Last refill:  09/03/18  Future visit scheduled: yes   Notes to clinic:  Please see pharmacy note that states that the medication is on backorder and is not available.     Requested Prescriptions  Pending Prescriptions Disp Refills   pioglitazone (ACTOS) 15 MG tablet [Pharmacy Med Name: PIOGLITAZONE HCL 15 MG TABLET]  0    Sig: Please specify directions, refills and quantity     Endocrinology:  Diabetes - Glitazones - pioglitazone Passed - 09/04/2018  1:51 PM      Passed - HBA1C is between 0 and 7.9 and within 180 days    Hemoglobin A1C  Date Value Ref Range Status  10/27/2015 7.3%  Final   HB A1C (BAYER DCA - WAIVED)  Date Value Ref Range Status  07/16/2018 6.4 <7.0 % Final    Comment:                                          Diabetic Adult            <7.0                                       Healthy Adult        4.3 - 5.7                                                           (DCCT/NGSP) American Diabetes Association's Summary of Glycemic Recommendations for Adults with Diabetes: Hemoglobin A1c <7.0%. More stringent glycemic goals (A1c <6.0%) may further reduce complications at the cost of increased risk of hypoglycemia.          Passed - Valid encounter within last 6 months    Recent Outpatient Visits          Today Close Exposure to [place], Henrine [place] T, NP   1 month ago Type 2 diabetes mellitus with stage 2 chronic kidney disease, with long-term current use of insulin ([place])   [place], [place] T, NP   2 months ago Acute cystitis with hematuria   [place], [place] T, NP   4 months ago Type 2 diabetes mellitus with stage 2 chronic kidney disease, with long-term current use of insulin ([place])   [place], [place] T, NP   6 months ago Acute  cystitis without hematuria   [place], [name], NP      Future Appointments            In 2 weeks Cannady, [name], NP [facility]

## 2018-09-04 NOTE — Telephone Encounter (Signed)
Scheduled patient today at 10:45 am at [facility]  Testing protocol reviewed.

## 2018-09-04 NOTE — Assessment & Plan Note (Signed)
Message sent to [facility] team for testing.  Have recommended maintaining a 14 day at home quarantine at this time based on current and symptoms.  Patient agrees with this POC.  They will return in 2 weeks for follow-up.

## 2018-09-04 NOTE — Patient Instructions (Signed)
This information is directly available on the CDC website: https://www.cdc.gov/coronavirus/2019-ncov/if-you-are-sick/steps-when-sick.html    Source:CDC Reference to specific commercial products, manufacturers, companies, or trademarks does not constitute its endorsement or recommendation by the U.S. Government, Department of Health and Human Services, or Centers for Disease Control and Prevention.  

## 2018-09-04 NOTE — Progress Notes (Signed)
LMP  (LMP Unknown)    Subjective:    [name], female    DOB: 1955/08/23, 63 y.o.   MRN: [id number]  HPI: April Johnson is a 63 y.o. female  Chief Complaint  Patient presents with  . COVID Exposure    pt states her sister was tested Friday for COVID and was positive yesterday    . This visit was completed via telephone due to the restrictions of the COVID-19 pandemic. All issues as above were discussed and addressed but no physical exam was performed. If it was felt that the patient should be evaluated in the office, they were directed there. The patient verbally consented to this visit. Patient was unable to complete an audio/visual visit due to Technical difficulties,Lack of internet. Due to the catastrophic nature of the COVID-19 pandemic, this visit was done through audio contact only. . Location of the patient: home . Location of the provider: work . Those involved with this call:  . Provider: Marnee Guarneri, DNP . CMA: Yvonna Alanis, CMA . Front Desk/Registration: Jill Side  . Time spent on call: 15 minutes on the phone discussing health concerns. 5 minutes total spent in review of patient's record and preparation of their chart. I verified patient identity using two factors (patient name and date of birth). Patient consents verbally to being seen via telemedicine visit today.   COVID EXPOSURE Was exposed to his sister who was diagnosed Covid positive yesterday. Denies loss of taste or GI symptoms.   Fever: no Cough: no Shortness of breath: no Wheezing: no Chest pain: none Chest tightness: no Chest congestion: no Nasal congestion: no Runny nose: yes Post nasal drip: no Sneezing: no Sore throat: no Swollen glands: no Sinus pressure: no Headache: yes Face pain: no Toothache: no Ear pain:none Ear pressure:none Eyes red/itching:no Eye drainage/crusting: no  Vomiting: no Rash: no Fatigue: yes Sick contacts: yes    Relevant past medical, surgical, family and social history reviewed and updated as indicated. Interim medical history since our last visit reviewed. Allergies and medications reviewed and updated.  Review of Systems  Constitutional: Positive for fatigue. Negative for activity change, appetite change, chills, diaphoresis and fever.  HENT: Positive for postnasal drip and rhinorrhea. Negative for congestion, ear discharge, ear pain, facial swelling, sinus pressure, sinus pain, sneezing, sore throat and voice change.   Eyes: Negative for pain and visual disturbance.  Respiratory: Negative for cough, chest tightness, shortness of breath and wheezing.   Cardiovascular: Negative for chest pain, palpitations and leg swelling.  Gastrointestinal: Negative for abdominal distention, abdominal pain, constipation, diarrhea, nausea and vomiting.  Endocrine: Negative.   Musculoskeletal: Positive for myalgias.  Neurological: Positive for headaches. Negative for dizziness and numbness.  Psychiatric/Behavioral: Negative.     Per HPI unless specifically indicated above     Objective:    LMP  (LMP Unknown)   Wt Readings from Last 3 Encounters:  06/19/18 210 lb (95.3 kg)  04/26/18 212 lb 2 oz (96.2 kg)  02/13/18 208 lb 4 oz (94.5 kg)    Physical Exam   Unable to perform due to telephone only  Results for orders placed or performed in visit on 07/16/18  Lipid Panel Piccolo, [employer]  Result Value Ref Range   Cholesterol Piccolo, Waived 162 <200 mg/dL   HDL Chol Piccolo, Waived 61 >59 mg/dL   Triglycerides Piccolo,Waived 314 (H) <150 mg/dL   Chol/HDL Ratio Piccolo,Waive 2.7 mg/dL   LDL Chol Calc Piccolo Waived 39 <100 mg/dL  VLDL Chol Calc Piccolo,Waive 63 (H) <30 mg/dL  Comprehensive metabolic panel  Result Value Ref Range   Glucose 121 (H) 65 - 99 mg/dL   BUN 26 8 - 27 mg/dL   Creatinine, Ser 1.21 (H) 0.57 - 1.00 mg/dL   GFR calc non Af Amer 48 (L) >59 mL/min/1.73   GFR calc Af Amer 56 (L)  >59 mL/min/1.73   BUN/Creatinine Ratio 21 12 - 28   Sodium 139 134 - 144 mmol/L   Potassium 4.4 3.5 - 5.2 mmol/L   Chloride 97 96 - 106 mmol/L   CO2 26 20 - 29 mmol/L   Calcium 10.3 8.7 - 10.3 mg/dL   Total Protein 6.9 6.0 - 8.5 g/dL   Albumin 4.5 3.8 - 4.8 g/dL   Globulin, Total 2.4 1.5 - 4.5 g/dL   Albumin/Globulin Ratio 1.9 1.2 - 2.2   Bilirubin Total 0.4 0.0 - 1.2 mg/dL   Alkaline Phosphatase 80 39 - 117 IU/L   AST 13 0 - 40 IU/L   ALT 11 0 - 32 IU/L  Bayer DCA Hb A1c Waived  Result Value Ref Range   HB A1C (BAYER DCA - WAIVED) 6.4 <7.0 %      Assessment & Plan:   Problem List Items Addressed This Visit    None      I discussed the assessment and treatment plan with the patient. The patient was provided an opportunity to ask questions and all were answered. The patient agreed with the plan and demonstrated an understanding of the instructions.   The patient was advised to call back or seek an in-person evaluation if the symptoms worsen or if the condition fails to improve as anticipated.   I provided 15 minutes of time during this encounter. Follow up plan: No follow-ups on file.

## 2018-09-04 NOTE — Telephone Encounter (Signed)
-----   Message from Venita Lick, NP sent at 09/04/2018  9:03 AM EDT ----- Name: April Johnson  DOB: 20-Feb-1956  MRN: [id number]  Insurance: [employer]  Reason: high risk with obesity and T2DM.  Had exposure to sister who is covid positive.  Now with symptoms.

## 2018-09-11 LAB — NOVEL CORONAVIRUS, NAA: SARS-CoV-2, NAA: NOT DETECTED

## 2018-09-12 ENCOUNTER — Telehealth: Payer: Self-pay | Admitting: Nurse Practitioner

## 2018-09-12 NOTE — Telephone Encounter (Signed)
Husband given COVID 19 results.

## 2018-09-18 ENCOUNTER — Ambulatory Visit: Payer: Commercial Managed Care - PPO | Admitting: Nurse Practitioner

## 2018-09-25 ENCOUNTER — Other Ambulatory Visit: Payer: Self-pay | Admitting: Nurse Practitioner

## 2018-09-25 MED ORDER — PIOGLITAZONE HCL-METFORMIN HCL 15-500 MG PO TABS: 1.0000 | ORAL_TABLET | Freq: Two times a day (BID) | ORAL | 3 refills | Status: DC

## 2018-09-25 NOTE — Telephone Encounter (Signed)
Shawn with [facility] calling to check on this.  States they received a separate RX for the Pioglitazone but with no directions so they are unable to fill. And, they never received the metformin separate so they can't fill that either.  Pt is completely out of both. Raquel Sarna can be reached at [phone].

## 2018-09-25 NOTE — Progress Notes (Signed)
Actoplus refill

## 2018-09-25 NOTE — Telephone Encounter (Signed)
Fixed error in chart and resent

## 2018-09-26 ENCOUNTER — Other Ambulatory Visit: Payer: Self-pay | Admitting: Nurse Practitioner

## 2018-09-26 ENCOUNTER — Ambulatory Visit: Payer: Self-pay | Admitting: Nurse Practitioner

## 2018-09-28 ENCOUNTER — Other Ambulatory Visit: Payer: Self-pay | Admitting: Nurse Practitioner

## 2018-09-28 ENCOUNTER — Telehealth: Payer: Self-pay | Admitting: Nurse Practitioner

## 2018-09-28 MED ORDER — METFORMIN HCL 500 MG PO TABS: 500.0000 mg | ORAL_TABLET | Freq: Two times a day (BID) | ORAL | 1 refills | Status: DC

## 2018-09-28 MED ORDER — PIOGLITAZONE HCL 30 MG PO TABS: 30.0000 mg | ORAL_TABLET | Freq: Every day | ORAL | 1 refills | Status: DC

## 2018-09-28 NOTE — Telephone Encounter (Signed)
Called pharmacy. Pioglitazone-Metformin combo tablet is on backorder. They do have both separate medications in stock. Can we separate and send prescriptions in for the patient please?

## 2018-09-28 NOTE — Telephone Encounter (Signed)
Sent in each as separate rx.  Actos 30 MG she will take one tablet daily and Metformin 500 MG she will take one tablet twice a day.  This is comparable to her Actoplus dosing.

## 2018-09-28 NOTE — Telephone Encounter (Signed)
Copied from [place] [phone]. Topic: Quick Communication - Rx Refill/Question >> Sep 28, 2018  3:58 PM Nils Flack, Marland Kitchen wrote: Medication: pioglitazone-metformin (ACTOPLUS MET) 15-500 MG tablet  Has the patient contacted their pharmacy? Yes.   (Agent: If no, request that the patient contact the pharmacy for the refill.) (Agent: If yes, when and what did the pharmacy advise?)  Preferred Pharmacy (with phone number or street name): [facility]  Pharm told pt that I is on back order.  Cb  for pt is [phone] [pt is out of meds for 2 days    Agent: Please be advised that RX refills may take up to 3 business days. We ask that you follow-up with your pharmacy.

## 2018-09-28 NOTE — Progress Notes (Signed)
Actoplus on back order.  New scripts sent in for separate pills/

## 2018-09-28 NOTE — Telephone Encounter (Signed)
Patient notified of medication change.

## 2018-10-02 ENCOUNTER — Other Ambulatory Visit: Payer: Self-pay | Admitting: Nurse Practitioner

## 2018-10-11 ENCOUNTER — Other Ambulatory Visit: Payer: Self-pay | Admitting: Nurse Practitioner

## 2018-10-11 NOTE — Telephone Encounter (Signed)
Requested medication (s) are due for refill today: yes  Requested medication (s) are on the active medication list: yes  Last refill:  08/30/2018  Future visit scheduled: yes  Notes to clinic:  Not delegated    Requested Prescriptions  Pending Prescriptions Disp Refills   ondansetron (ZOFRAN) 4 MG tablet [Pharmacy Med Name: ONDANSETRON HCL 4 MG TABLET] 21 tablet 1    Sig: TAKE 1 TABLET BY MOUTH EVERY 8 HOURS AS NEEDED FOR NAUSEA AND VOMITING     Not Delegated - Gastroenterology: Antiemetics Failed - 10/11/2018 11:51 AM      Failed - This refill cannot be delegated      Passed - Valid encounter within last 6 months    Recent Outpatient Visits          1 month ago Close Exposure to [place], [name] T, NP   2 months ago Type 2 diabetes mellitus with stage 2 chronic kidney disease, with long-term current use of insulin ([place])   [place], [place] T, NP   3 months ago Acute cystitis with hematuria   [place], [name] T, NP   5 months ago Type 2 diabetes mellitus with stage 2 chronic kidney disease, with long-term current use of insulin ([place])   [place], [place] T, NP   8 months ago Acute cystitis without hematuria   [place], [name], NP      Future Appointments            In 1 week Cannady, [name], NP [facility]

## 2018-10-16 ENCOUNTER — Other Ambulatory Visit: Payer: Self-pay | Admitting: Nurse Practitioner

## 2018-10-16 ENCOUNTER — Other Ambulatory Visit: Payer: Self-pay | Admitting: Unknown Physician Specialty

## 2018-10-23 ENCOUNTER — Ambulatory Visit: Payer: Self-pay | Admitting: Nurse Practitioner

## 2018-10-31 ENCOUNTER — Other Ambulatory Visit: Payer: Self-pay

## 2018-10-31 ENCOUNTER — Encounter: Payer: Self-pay | Admitting: Nurse Practitioner

## 2018-10-31 ENCOUNTER — Ambulatory Visit: Payer: Commercial Managed Care - PPO | Admitting: Nurse Practitioner

## 2018-10-31 VITALS — BP 122/88 | HR 60 | Temp 98.1°F

## 2018-10-31 DIAGNOSIS — N182 Chronic kidney disease, stage 2 (mild): Secondary | ICD-10-CM

## 2018-10-31 DIAGNOSIS — E1122 Type 2 diabetes mellitus with diabetic chronic kidney disease: Secondary | ICD-10-CM | POA: Diagnosis not present

## 2018-10-31 DIAGNOSIS — I1310 Hypertensive heart and chronic kidney disease without heart failure, with stage 1 through stage 4 chronic kidney disease, or unspecified chronic kidney disease: Secondary | ICD-10-CM | POA: Diagnosis not present

## 2018-10-31 DIAGNOSIS — E1169 Type 2 diabetes mellitus with other specified complication: Secondary | ICD-10-CM

## 2018-10-31 DIAGNOSIS — Z23 Encounter for immunization: Secondary | ICD-10-CM | POA: Diagnosis not present

## 2018-10-31 DIAGNOSIS — Z794 Long term (current) use of insulin: Secondary | ICD-10-CM

## 2018-10-31 DIAGNOSIS — E785 Hyperlipidemia, unspecified: Secondary | ICD-10-CM

## 2018-10-31 DIAGNOSIS — N183 Chronic kidney disease, stage 3 unspecified: Secondary | ICD-10-CM

## 2018-10-31 LAB — BAYER DCA HB A1C WAIVED: HB A1C (BAYER DCA - WAIVED): 6.6 % (ref <7.0)

## 2018-10-31 LAB — MICROALBUMIN, URINE WAIVED
Creatinine, Urine Waived: 100 mg/dL (ref 10–300)
Microalb, Ur Waived: 30 mg/L — ABNORMAL HIGH (ref 0–19)
Microalb/Creat Ratio: <30 mg/g (ref <30)

## 2018-10-31 MED ORDER — METFORMIN HCL 500 MG PO TABS: 1000.0000 mg | ORAL_TABLET | Freq: Two times a day (BID) | ORAL | 3 refills | Status: DC

## 2018-10-31 MED ORDER — SEMAGLUTIDE (1 MG/DOSE) 2 MG/1.5ML ~~LOC~~ SOPN: 1.0000 mg | PEN_INJECTOR | SUBCUTANEOUS | 6 refills | Status: DC

## 2018-10-31 NOTE — Assessment & Plan Note (Addendum)
Chronic, ongoing.  CMP next visit and adjust medications as needed based on renal function. Continue Telmisartan for kidney protection.

## 2018-10-31 NOTE — Patient Instructions (Addendum)
Stop Actos and go up to 1000 MG twice a day on Metformin, continue to reduce insulin.  Influenza (Flu) Vaccine (Inactivated or Recombinant): What You Need to Know 1. Why get vaccinated? Influenza vaccine can prevent influenza (flu). Flu is a contagious disease that spreads around the [place] every year, usually between October and May. Anyone can get the flu, but it is more dangerous for some people. Infants and young children, people 62 years of age and older, pregnant women, and people with certain health conditions or a weakened immune system are at greatest risk of flu complications. Pneumonia, bronchitis, sinus infections and ear infections are examples of flu-related complications. If you have a medical condition, such as heart disease, cancer or diabetes, flu can make it worse. Flu can cause fever and chills, sore throat, muscle aches, fatigue, cough, headache, and runny or stuffy nose. Some people may have vomiting and diarrhea, though this is more common in children than adults. Each year thousands of people in the [place] die from flu, and many more are hospitalized. Flu vaccine prevents millions of illnesses and flu-related visits to the doctor each year. 2. Influenza vaccine CDC recommends everyone 23 months of age and older get vaccinated every flu season. Children 6 months through 58 years of age may need 2 doses during a single flu season. Everyone else needs only 1 dose each flu season. It takes about 2 weeks for protection to develop after vaccination. There are many flu viruses, and they are always changing. Each year a new flu vaccine is made to protect against three or four viruses that are likely to cause disease in the upcoming flu season. Even when the vaccine doesn't exactly match these viruses, it may still provide some protection. Influenza vaccine does not cause flu. Influenza vaccine may be given at the same time as other vaccines. 3. Talk with your health care  provider Tell your vaccine provider if the person getting the vaccine:  Has had an allergic reaction after a previous dose of influenza vaccine, or has any severe, life-threatening allergies.  Has ever had Guillain-Barr Syndrome (also called GBS). In some cases, your health care provider may decide to postpone influenza vaccination to a future visit. People with minor illnesses, such as a cold, may be vaccinated. People who are moderately or severely ill should usually wait until they recover before getting influenza vaccine. Your health care provider can give you more information. 4. Risks of a vaccine reaction  Soreness, redness, and swelling where shot is given, fever, muscle aches, and headache can happen after influenza vaccine.  There may be a very small increased risk of Guillain-Barr Syndrome (GBS) after inactivated influenza vaccine (the flu shot). Young children who get the flu shot along with pneumococcal vaccine (PCV13), and/or DTaP vaccine at the same time might be slightly more likely to have a seizure caused by fever. Tell your health care provider if a child who is getting flu vaccine has ever had a seizure. People sometimes faint after medical procedures, including vaccination. Tell your provider if you feel dizzy or have vision changes or ringing in the ears. As with any medicine, there is a very remote chance of a vaccine causing a severe allergic reaction, other serious injury, or death. 5. What if there is a serious problem? An allergic reaction could occur after the vaccinated person leaves the clinic. If you see signs of a severe allergic reaction (hives, swelling of the face and throat, difficulty breathing, a fast  heartbeat, dizziness, or weakness), call 9-1-1 and get the person to the nearest hospital. For other signs that concern you, call your health care provider. Adverse reactions should be reported to the Vaccine Adverse Event Reporting System (VAERS). Your health  care provider will usually file this report, or you can do it yourself. Visit the VAERS website at www.vaers.SamedayNews.es or call (213)066-8936.VAERS is only for reporting reactions, and VAERS staff do not give medical advice. 6. The National Vaccine Injury Compensation Program The [employer] Vaccine Injury Compensation Program (VICP) is a federal program that was created to compensate people who may have been injured by certain vaccines. Visit the VICP website at GoldCloset.com.ee or call 724-298-9787 to learn about the program and about filing a claim. There is a time limit to file a claim for compensation. 7. How can I learn more?  Ask your healthcare provider.  Call your local or state health department.  Contact the Centers for Disease Control and Prevention (CDC): ? Call 774-430-3262 (1-800-CDC-INFO) or ? Visit CDC's https://gibson.com/ Vaccine Information Statement (Interim) Inactivated Influenza Vaccine (10/19/2017) This information is not intended to replace advice given to you by your health care provider. Make sure you discuss any questions you have with your health care provider. Document Released: 12/16/2005 Document Revised: 06/12/2018 Document Reviewed: 10/23/2017 Elsevier Patient Education  2020 [employer].

## 2018-10-31 NOTE — Assessment & Plan Note (Signed)
A1C today 6.6%, making her at goal for over a year now.  Will d/c Actos and increase Metformin to max dose 1000 MG BID, with goal of insulin reduction and possible discontinuation in future.  Reiterated importance of decreasing Levemir by 2 units every 3 days based on morning blood sugar readings. Continue Ozempic and [place].  Return in 3 months.

## 2018-10-31 NOTE — Assessment & Plan Note (Signed)
Chronic, ongoing.  Continue current medication regimen and adjust as needed based on labs.   

## 2018-10-31 NOTE — Assessment & Plan Note (Addendum)
Chronic, ongoing with BP at goal today.  Recommend she obtain BP cuff and check at least 3 times a week at home.  Urine ALB 30 and A:C < 30.  Continue to monitor kidney function and if GFR < 45 consider d/c Farxiga or reduction Metformin.  Return in 3 months.

## 2018-10-31 NOTE — Progress Notes (Addendum)
BP 122/88 (BP Location: Left Arm, Cuff Size: Large)   Pulse 60   Temp 98.1 F (36.7 C) (Oral)   LMP  (LMP Unknown)   SpO2 98%    Subjective:    [name], female    DOB: 19-Apr-1955, 63 y.o.   MRN: [id number]  HPI: April Johnson is a 63 y.o. female  Chief Complaint  Patient presents with  . Diabetes  . Hyperlipidemia  . Hypertension   DIABETES Taking Levemir 60 units every other night, sometimes less.  Also taking Ozempic 1MG , Metformin 500 MG BID, Farxiga 5 MG QDAY.  Last A1C was 6.4% and provider had recommended continued reduction of insulin to avoid hypoglycemia.  A1C today 6.6%, making her at goal for over a year now.  Discussed with her on this visit whether she had ever taken Metformin 1000 MG BID, which she reports she has not.  She agrees with trial off Actos and increasing Metformin to max dose + continuing reduction of insulin, with goal to discontinue completely if able.  Reiterated importance of decreasing Levemir by 2 units every 3 days based on morning blood sugar readings.  Hypoglycemic episodes:no Polydipsia/polyuria: no Visual disturbance: no Chest pain: no Paresthesias: no Glucose Monitoring: yes  Accucheck frequency: Daily  Fasting glucose: 105-125  Post prandial:  Evening:  Before meals: Taking Insulin?: yes  Long acting insulin: 60 units every other day  Short acting insulin: Blood Pressure Monitoring: not checking Retinal Examination: Not up to Date Foot Exam: Up to Date Pneumovax: Up to Date Influenza: Up to Date Aspirin: yes   HYPERTENSION / HYPERLIPIDEMIA WITH CKD Continues on Amlodipine, ASA, Lipitor, Metoprolol, and Micardis.   Satisfied with current treatment? yes Duration of hypertension: chronic BP monitoring frequency: not checking BP range:  BP medication side effects: no Duration of hyperlipidemia: chronic Cholesterol medication side effects: no Cholesterol supplements: none Medication compliance:  good compliance Aspirin: yes Recent stressors: no Recurrent headaches: no Visual changes: no Palpitations: no Dyspnea: no Chest pain: no Lower extremity edema: no Dizzy/lightheaded: no  Relevant past medical, surgical, family and social history reviewed and updated as indicated. Interim medical history since our last visit reviewed. Allergies and medications reviewed and updated.  Review of Systems  Constitutional: Negative for activity change, appetite change, diaphoresis, fatigue and fever.  Respiratory: Negative for cough, chest tightness and shortness of breath.   Cardiovascular: Negative for chest pain, palpitations and leg swelling.  Gastrointestinal: Negative for abdominal distention, abdominal pain, constipation, diarrhea, nausea and vomiting.  Endocrine: Negative for cold intolerance, heat intolerance, polydipsia, polyphagia and polyuria.  Neurological: Negative for dizziness, syncope, weakness, light-headedness, numbness and headaches.  Psychiatric/Behavioral: Negative.     Per HPI unless specifically indicated above     Objective:    BP 122/88 (BP Location: Left Arm, Cuff Size: Large)   Pulse 60   Temp 98.1 F (36.7 C) (Oral)   LMP  (LMP Unknown)   SpO2 98%   Wt Readings from Last 3 Encounters:  06/19/18 210 lb (95.3 kg)  04/26/18 212 lb 2 oz (96.2 kg)  02/13/18 208 lb 4 oz (94.5 kg)    Physical Exam Vitals signs and nursing note reviewed.  Constitutional:      General: She is awake. She is not in acute distress.    Appearance: She is well-developed and overweight. She is not ill-appearing.  HENT:     Head: Normocephalic.     Right Ear: Hearing normal.  Left Ear: Hearing normal.  Eyes:     General: Lids are normal.        Right eye: No discharge.        Left eye: No discharge.     Conjunctiva/sclera: Conjunctivae normal.     Pupils: Pupils are equal, round, and reactive to light.  Neck:     Musculoskeletal: Normal range of motion and neck supple.      Thyroid: No thyromegaly.     Vascular: No carotid bruit.  Cardiovascular:     Rate and Rhythm: Normal rate and regular rhythm.     Heart sounds: Normal heart sounds. No murmur. No gallop.   Pulmonary:     Effort: Pulmonary effort is normal.     Breath sounds: Normal breath sounds.  Abdominal:     General: Bowel sounds are normal.     Palpations: Abdomen is soft.  Musculoskeletal:     Right lower leg: No edema.     Left lower leg: No edema.  Skin:    General: Skin is warm and dry.  Neurological:     Mental Status: She is alert and oriented to person, place, and time.  Psychiatric:        Attention and Perception: Attention normal.        Mood and Affect: Mood normal.        Behavior: Behavior normal. Behavior is cooperative.        Thought Content: Thought content normal.        Judgment: Judgment normal.    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  Recommend she obtain BP cuff and check at least 3 times a week at home.  Urine ALB 30 and A:C < 30.  Continue to monitor kidney function and if GFR < 45 consider d/c Farxiga or reduction Metformin.  Return in 3 months.        Endocrine   Diabetes mellitus ([place]) - Primary    A1C today 6.6%, making her at goal for over a year now.  Will d/c Actos and increase Metformin to max dose 1000 MG  BID, with goal of insulin reduction and possible discontinuation in future.  Reiterated importance of decreasing Levemir by 2 units every 3 days based on morning blood sugar readings. Continue Ozempic and [place].  Return in 3 months.      Relevant Medications   Semaglutide, 1 MG/DOSE, 2 MG/1.5ML SOPN   metFORMIN (GLUCOPHAGE) 500 MG tablet   Other Relevant Orders   Bayer DCA Hb A1c Waived   Microalbumin, Urine Waived   Hyperlipidemia associated with type 2 diabetes mellitus (HCC)    Chronic, ongoing.  Continue current medication regimen and adjust as needed based on labs.      Relevant Medications   Semaglutide, 1 MG/DOSE, 2 MG/1.5ML SOPN   metFORMIN (  GLUCOPHAGE) 500 MG tablet     Genitourinary   CKD (chronic kidney disease) stage 3, GFR 30-59 ml/min (HCC)    Chronic, ongoing.  CMP next visit and adjust medications as needed based on renal function. Continue Telmisartan for kidney protection.       Other Visit Diagnoses    Need for influenza vaccination       Relevant Orders   Flu Vaccine QUAD 36+ mos IM (Completed)       Follow up plan: Return in about 3 months (around 01/31/2019) for T2DM, HTN/HLD.

## 2018-12-04 ENCOUNTER — Telehealth: Payer: Self-pay

## 2018-12-10 ENCOUNTER — Other Ambulatory Visit: Payer: Self-pay | Admitting: Nurse Practitioner

## 2018-12-10 DIAGNOSIS — E1122 Type 2 diabetes mellitus with diabetic chronic kidney disease: Secondary | ICD-10-CM

## 2018-12-10 DIAGNOSIS — Z794 Long term (current) use of insulin: Secondary | ICD-10-CM

## 2018-12-10 NOTE — Telephone Encounter (Signed)
Requested medication (s) are due for refill today: no  Requested medication (s) are on the active medication list; no  Last refill: 07/18/2018  Future visit scheduled: yes  Notes to clinic:  Medication was discontinued   Requested Prescriptions  Pending Prescriptions Disp Refills   OZEMPIC, 0.25 OR 0.5 MG/DOSE, 2 MG/1.5ML SOPN [Pharmacy Med Name: OZEMPIC 0.25-0.5 MG DOSE PEN]  1    Sig: INJECT 0.75 MG INTO THE SKIN ONCE A WEEK.     Endocrinology:  Diabetes - GLP-1 Receptor Agonists Passed - 12/10/2018 10:53 AM      Passed - HBA1C is between 0 and 7.9 and within 180 days    Hemoglobin A1C  Date Value Ref Range Status  10/27/2015 7.3%  Final   HB A1C (BAYER DCA - WAIVED)  Date Value Ref Range Status  10/31/2018 6.6 <7.0 % Final    Comment:                                          Diabetic Adult            <7.0                                       Healthy Adult        4.3 - 5.7                                                           (DCCT/NGSP) American Diabetes Association's Summary of Glycemic Recommendations for Adults with Diabetes: Hemoglobin A1c <7.0%. More stringent glycemic goals (A1c <6.0%) may further reduce complications at the cost of increased risk of hypoglycemia.          Passed - Valid encounter within last 6 months    Recent Outpatient Visits          1 month ago Type 2 diabetes mellitus with stage 2 chronic kidney disease, with long-term current use of insulin ([place])   [place], [place] T, NP   3 months ago [place], [place] T, NP   4 months ago Type 2 diabetes mellitus with stage 2 chronic kidney disease, with long-term current use of insulin ([place])   [place], Sperry T, NP   5 months ago Acute cystitis with hematuria   [place], [name] T, NP   7 months ago Type 2 diabetes mellitus with stage 2 chronic kidney disease, with  long-term current use of insulin ([place])   [place], [name], NP      Future Appointments            In 1 month Cannady, [name], NP [facility]

## 2018-12-18 ENCOUNTER — Other Ambulatory Visit: Payer: Self-pay | Admitting: Nurse Practitioner

## 2018-12-18 DIAGNOSIS — E1122 Type 2 diabetes mellitus with diabetic chronic kidney disease: Secondary | ICD-10-CM

## 2018-12-18 NOTE — Telephone Encounter (Signed)
Please alert patient that she has refills on the 1 MG Ozempic pens at pharmacy.  I refilled this in August and placed refills on this script.

## 2018-12-18 NOTE — Telephone Encounter (Signed)
Requested medication (s) are due for refill today: no  Requested medication (s) are on the active medication list: no  Last refill:  07/18/2018  Future visit scheduled: yes  Notes to clinic:  Medication was discounted    Requested Prescriptions  Pending Prescriptions Disp Refills   OZEMPIC, 0.25 OR 0.5 MG/DOSE, 2 MG/1.5ML SOPN [Pharmacy Med Name: OZEMPIC 0.25-0.5 MG DOSE PEN]  1    Sig: INJECT 0.75 MG INTO THE SKIN ONCE A WEEK.     Endocrinology:  Diabetes - GLP-1 Receptor Agonists Passed - 12/18/2018  1:56 PM      Passed - HBA1C is between 0 and 7.9 and within 180 days    Hemoglobin A1C  Date Value Ref Range Status  10/27/2015 7.3%  Final   HB A1C (BAYER DCA - WAIVED)  Date Value Ref Range Status  10/31/2018 6.6 <7.0 % Final    Comment:                                          Diabetic Adult            <7.0                                       Healthy Adult        4.3 - 5.7                                                           (DCCT/NGSP) American Diabetes Association's Summary of Glycemic Recommendations for Adults with Diabetes: Hemoglobin A1c <7.0%. More stringent glycemic goals (A1c <6.0%) may further reduce complications at the cost of increased risk of hypoglycemia.          Passed - Valid encounter within last 6 months    Recent Outpatient Visits          1 month ago Type 2 diabetes mellitus with stage 2 chronic kidney disease, with long-term current use of insulin ([place])   [place], [place] T, NP   3 months ago [place], [place] T, NP   5 months ago Type 2 diabetes mellitus with stage 2 chronic kidney disease, with long-term current use of insulin ([place])   [place], [place]on T, NP   6 months ago Acute cystitis with hematuria   [place], [name] T, NP   7 months ago Type 2 diabetes mellitus with stage 2 chronic kidney disease, with  long-term current use of insulin ([place])   [place], [name], NP      Future Appointments            In 1 month Cannady, [name], NP [facility]

## 2018-12-25 ENCOUNTER — Other Ambulatory Visit: Payer: Self-pay | Admitting: Nurse Practitioner

## 2018-12-25 ENCOUNTER — Other Ambulatory Visit: Payer: Self-pay | Admitting: Unknown Physician Specialty

## 2018-12-25 ENCOUNTER — Telehealth: Payer: Self-pay | Admitting: Nurse Practitioner

## 2018-12-25 DIAGNOSIS — Z794 Long term (current) use of insulin: Secondary | ICD-10-CM

## 2018-12-25 DIAGNOSIS — E1122 Type 2 diabetes mellitus with diabetic chronic kidney disease: Secondary | ICD-10-CM

## 2018-12-25 DIAGNOSIS — N182 Chronic kidney disease, stage 2 (mild): Secondary | ICD-10-CM

## 2018-12-25 MED ORDER — SEMAGLUTIDE (1 MG/DOSE) 2 MG/1.5ML ~~LOC~~ SOPN: 1.0000 mg | PEN_INJECTOR | SUBCUTANEOUS | 6 refills | Status: DC

## 2018-12-25 NOTE — Telephone Encounter (Signed)
Script resent

## 2018-12-25 NOTE — Telephone Encounter (Signed)
Requested medication (s) are due for refill today: yes  Requested medication (s) are on the active medication list: yes  Last refill:  07/18/2018  Future visit scheduled: yes  Notes to clinic:  Review for refill Medication was discontinued on 10/31/2018   Requested Prescriptions  Pending Prescriptions Disp Refills   OZEMPIC, 0.25 OR 0.5 MG/DOSE, 2 MG/1.5ML SOPN [Pharmacy Med Name: OZEMPIC 0.25-0.5 MG DOSE PEN]  1    Sig: INJECT 0.75 MG INTO THE SKIN ONCE A WEEK.     Endocrinology:  Diabetes - GLP-1 Receptor Agonists Passed - 12/25/2018 12:48 PM      Passed - HBA1C is between 0 and 7.9 and within 180 days    Hemoglobin A1C  Date Value Ref Range Status  10/27/2015 7.3%  Final   HB A1C (BAYER DCA - WAIVED)  Date Value Ref Range Status  10/31/2018 6.6 <7.0 % Final    Comment:                                          Diabetic Adult            <7.0                                       Healthy Adult        4.3 - 5.7                                                           (DCCT/NGSP) American Diabetes Association's Summary of Glycemic Recommendations for Adults with Diabetes: Hemoglobin A1c <7.0%. More stringent glycemic goals (A1c <6.0%) may further reduce complications at the cost of increased risk of hypoglycemia.          Passed - Valid encounter within last 6 months    Recent Outpatient Visits          1 month ago Type 2 diabetes mellitus with stage 2 chronic kidney disease, with long-term current use of insulin ([place])   [place], [place] T, NP   3 months ago Close [place] to [name], [name], NP   5 months ago Type 2 diabetes mellitus with stage 2 chronic kidney disease, with long-term current use of insulin ([name])   [name], [name], NP   6 months ago Acute cystitis with hematuria   [place], [name] T, NP   8 months ago Type 2 diabetes mellitus with stage  2 chronic kidney disease, with long-term current use of insulin ([name])   [name], [name], NP      Future Appointments            In 1 month Cannady, [name], NP [facility]

## 2018-12-25 NOTE — Telephone Encounter (Signed)
Called and left a message letting patient know that a prescription was sent in August.

## 2018-12-25 NOTE — Telephone Encounter (Signed)
Copied from [place] [id number]. Topic: Quick Communication - Rx Refill/Question >> Dec 25, 2018  2:28 PM [name] wrote: Medication Semaglutide, 1 MG/DOSE, 2 MG/1.5ML [facility]    pharmacy never rec the Rx that was sent in August , please resend pt out   Preferred Pharmacy  [facility]  [address]   Agent: Please be advised that RX refills may take up to 3 business days. We ask that you follow-up with your pharmacy.

## 2018-12-30 ENCOUNTER — Other Ambulatory Visit: Payer: Self-pay | Admitting: Nurse Practitioner

## 2018-12-31 NOTE — Telephone Encounter (Signed)
Requested medication (s) are due for refill today: yes  Requested medication (s) are on the active medication list: yes  Last refill:  05/12/2018  Future visit scheduled: yes  Notes to clinic:  Medication has been discontinued  Refill cannot be delegated on one of medications   Requested Prescriptions  Pending Prescriptions Disp Refills   telmisartan-hydrochlorothiazide (MICARDIS HCT) 80-25 MG tablet [Pharmacy Med Name: TELMISARTAN-HCTZ 80-25 MG TAB] 90 tablet 1    Sig: TAKE 1 TABLET BY MOUTH EVERY DAY     Cardiovascular: ARB + Diuretic Combos Failed - 12/30/2018  1:23 PM      Failed - Cr in normal range and within 180 days    Creatinine, Ser  Date Value Ref Range Status  07/16/2018 1.21 (H) 0.57 - 1.00 mg/dL Final         Passed - K in normal range and within 180 days    Potassium  Date Value Ref Range Status  07/16/2018 4.4 3.5 - 5.2 mmol/L Final         Passed - Na in normal range and within 180 days    Sodium  Date Value Ref Range Status  07/16/2018 139 134 - 144 mmol/L Final         Passed - Ca in normal range and within 180 days    Calcium  Date Value Ref Range Status  07/16/2018 10.3 8.7 - 10.3 mg/dL Final         Passed - Patient is not pregnant      Passed - Last BP in normal range    BP Readings from Last 1 Encounters:  10/31/18 122/88         Passed - Valid encounter within last 6 months    Recent Outpatient Visits          2 months ago Type 2 diabetes mellitus with stage 2 chronic kidney disease, with long-term current use of insulin ([place])   [place], [place] T, NP   3 months ago Close Exposure to [place], Elliott T, NP   5 months ago Type 2 diabetes mellitus with stage 2 chronic kidney disease, with long-term current use of insulin ([place])   [place], [place] T, NP   6 months ago Acute cystitis with hematuria   [place], [name] T, NP   8  months ago Type 2 diabetes mellitus with stage 2 chronic kidney disease, with long-term current use of insulin ([place])   [place], [place] T, NP      Future Appointments            In 1 month Cannady, [place] T, NP [facility]            omeprazole (PRILOSEC) 20 MG capsule [Pharmacy Med Name: OMEPRAZOLE DR 20 MG CAPSULE] 90 capsule 0    Sig: TAKE 1 CAPSULE BY MOUTH EVERY DAY     Gastroenterology: Proton Pump Inhibitors Passed - 12/30/2018  1:23 PM      Passed - Valid encounter within last 12 months    Recent Outpatient Visits          2 months ago Type 2 diabetes mellitus with stage 2 chronic kidney disease, with long-term current use of insulin ([place])   [place], [place] T, NP   3 months ago Close Exposure to [place], [name], NP  5 months ago Type 2 diabetes mellitus with stage 2 chronic kidney disease, with long-term current use of insulin ([place])   [place], [place] T, NP   6 months ago Acute cystitis with hematuria   [place], [name] T, NP   8 months ago Type 2 diabetes mellitus with stage 2 chronic kidney disease, with long-term current use of insulin ([place])   [place], [name], NP      Future Appointments            In 1 month Cannady, [name] T, NP [facility]            ondansetron (ZOFRAN) 4 MG tablet [Pharmacy Med Name: ONDANSETRON HCL 4 MG TABLET] 21 tablet 1    Sig: TAKE 1 TABLET BY MOUTH EVERY 8 HOURS AS NEEDED FOR NAUSEA AND VOMITING     Not Delegated - Gastroenterology: Antiemetics Failed - 12/30/2018  1:23 PM      Failed - This refill cannot be delegated      Passed - Valid encounter within last 6 months    Recent Outpatient Visits          2 months ago Type 2 diabetes mellitus with stage 2 chronic kidney disease, with long-term current use of insulin ([place])    [place], [place] T, NP   3 months ago Close Exposure to [place], [place] T, NP   5 months ago Type 2 diabetes mellitus with stage 2 chronic kidney disease, with long-term current use of insulin ([place])   [place], Britton T, NP   6 months ago Acute cystitis with hematuria   [place], [name] T, NP   8 months ago Type 2 diabetes mellitus with stage 2 chronic kidney disease, with long-term current use of insulin ([place])   [place], [name], NP      Future Appointments            In 1 month Cannady, [name], NP [facility]

## 2019-01-07 ENCOUNTER — Other Ambulatory Visit: Payer: Self-pay | Admitting: Nurse Practitioner

## 2019-01-12 ENCOUNTER — Emergency Department
Admission: EM | Admit: 2019-01-12 | Discharge: 2019-01-12 | Disposition: A | Discharge status: Home / Self Care | Payer: Commercial Managed Care - PPO | Attending: Emergency Medicine | Admitting: Emergency Medicine

## 2019-01-12 ENCOUNTER — Encounter: Payer: Self-pay | Admitting: Emergency Medicine

## 2019-01-12 ENCOUNTER — Emergency Department: Payer: Commercial Managed Care - PPO

## 2019-01-12 ENCOUNTER — Other Ambulatory Visit: Payer: Self-pay

## 2019-01-12 DIAGNOSIS — N183 Chronic kidney disease, stage 3 unspecified: Secondary | ICD-10-CM | POA: Diagnosis not present

## 2019-01-12 DIAGNOSIS — Y9389 Activity, other specified: Secondary | ICD-10-CM | POA: Diagnosis not present

## 2019-01-12 DIAGNOSIS — Y999 Unspecified external cause status: Secondary | ICD-10-CM | POA: Diagnosis not present

## 2019-01-12 DIAGNOSIS — Z79899 Other long term (current) drug therapy: Secondary | ICD-10-CM | POA: Insufficient documentation

## 2019-01-12 DIAGNOSIS — E1140 Type 2 diabetes mellitus with diabetic neuropathy, unspecified: Secondary | ICD-10-CM | POA: Insufficient documentation

## 2019-01-12 DIAGNOSIS — Z87891 Personal history of nicotine dependence: Secondary | ICD-10-CM | POA: Insufficient documentation

## 2019-01-12 DIAGNOSIS — I1310 Hypertensive heart and chronic kidney disease without heart failure, with stage 1 through stage 4 chronic kidney disease, or unspecified chronic kidney disease: Secondary | ICD-10-CM | POA: Insufficient documentation

## 2019-01-12 DIAGNOSIS — Z7982 Long term (current) use of aspirin: Secondary | ICD-10-CM | POA: Insufficient documentation

## 2019-01-12 DIAGNOSIS — S52591A Other fractures of lower end of right radius, initial encounter for closed fracture: Secondary | ICD-10-CM | POA: Insufficient documentation

## 2019-01-12 DIAGNOSIS — S0990XA Unspecified injury of head, initial encounter: Secondary | ICD-10-CM | POA: Diagnosis present

## 2019-01-12 DIAGNOSIS — Y92481 Parking lot as the place of occurrence of the external cause: Secondary | ICD-10-CM | POA: Diagnosis not present

## 2019-01-12 DIAGNOSIS — S52501A Unspecified fracture of the lower end of right radius, initial encounter for closed fracture: Secondary | ICD-10-CM

## 2019-01-12 DIAGNOSIS — S52614A Nondisplaced fracture of right ulna styloid process, initial encounter for closed fracture: Secondary | ICD-10-CM | POA: Insufficient documentation

## 2019-01-12 DIAGNOSIS — Z794 Long term (current) use of insulin: Secondary | ICD-10-CM | POA: Diagnosis not present

## 2019-01-12 LAB — BASIC METABOLIC PANEL
Anion gap: 18 — ABNORMAL HIGH (ref 5–15)
BUN: 14 mg/dL (ref 8–23)
CO2: 18 mmol/L — ABNORMAL LOW (ref 22–32)
Calcium: 9.4 mg/dL (ref 8.9–10.3)
Chloride: 102 mmol/L (ref 98–111)
Creatinine, Ser: 0.84 mg/dL (ref 0.44–1.00)
GFR calc Af Amer: >60 mL/min (ref >60)
GFR calc non Af Amer: >60 mL/min (ref >60)
Glucose, Bld: 161 mg/dL — ABNORMAL HIGH (ref 70–99)
Potassium: 3.8 mmol/L (ref 3.5–5.1)
Sodium: 138 mmol/L (ref 135–145)

## 2019-01-12 LAB — CBC
HCT: 43.4 % (ref 36.0–46.0)
Hemoglobin: 13.9 g/dL (ref 12.0–15.0)
MCH: 26.2 pg (ref 26.0–34.0)
MCHC: 32 g/dL (ref 30.0–36.0)
MCV: 81.9 fL (ref 80.0–100.0)
Platelets: 336 10*3/uL (ref 150–400)
RBC: 5.3 MIL/uL — ABNORMAL HIGH (ref 3.87–5.11)
RDW: 13.5 % (ref 11.5–15.5)
WBC: 14.5 10*3/uL — ABNORMAL HIGH (ref 4.0–10.5)
nRBC: 0 % (ref 0.0–0.2)

## 2019-01-12 MED ORDER — MELOXICAM 15 MG PO TABS: 15.0000 mg | ORAL_TABLET | Freq: Every day | ORAL | 0 refills | Status: DC

## 2019-01-12 MED ORDER — ACETAMINOPHEN 325 MG PO TABS
650.0000 mg | ORAL_TABLET | Freq: Once | ORAL | Status: AC
  Administered 2019-01-12: 650 mg via ORAL

## 2019-01-12 MED ORDER — OXYCODONE HCL 5 MG PO TABS: 5.0000 mg | ORAL_TABLET | Freq: Three times a day (TID) | ORAL | 0 refills | Status: DC | PRN

## 2019-01-12 MED ORDER — OXYCODONE HCL 5 MG PO TABS
10.0000 mg | ORAL_TABLET | Freq: Once | ORAL | Status: AC
  Administered 2019-01-12: 10 mg via ORAL
  Filled 2019-01-12: qty 2

## 2019-01-12 MED ORDER — ACETAMINOPHEN 325 MG PO TABS
ORAL_TABLET | ORAL | Status: AC
  Filled 2019-01-12: qty 2

## 2019-01-12 NOTE — ED Notes (Signed)
Pt verbalized understanding of discharge instructions. NAD at this time. 
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
## 2019-01-12 NOTE — Discharge Instructions (Signed)
Call Dr. Roland Rack for an appointment.  Return to the ER for concerns if unable to see the specialist or primary care.

## 2019-01-15 ENCOUNTER — Other Ambulatory Visit: Payer: Self-pay | Admitting: Nurse Practitioner

## 2019-01-16 ENCOUNTER — Encounter
Admission: RE | Admit: 2019-01-16 | Discharge: 2019-01-16 | Disposition: A | Discharge status: Home / Self Care | Payer: Commercial Managed Care - PPO | Source: Ambulatory Visit | Attending: Orthopedic Surgery | Admitting: Orthopedic Surgery

## 2019-01-16 ENCOUNTER — Other Ambulatory Visit
Admission: RE | Admit: 2019-01-16 | Discharge: 2019-01-16 | Disposition: A | Discharge status: Home / Self Care | Payer: Commercial Managed Care - PPO | Source: Ambulatory Visit | Attending: Orthopedic Surgery | Admitting: Orthopedic Surgery

## 2019-01-16 ENCOUNTER — Other Ambulatory Visit: Payer: Self-pay

## 2019-01-16 ENCOUNTER — Other Ambulatory Visit: Payer: Self-pay | Admitting: Orthopedic Surgery

## 2019-01-16 DIAGNOSIS — Z20828 Contact with and (suspected) exposure to other viral communicable diseases: Secondary | ICD-10-CM | POA: Insufficient documentation

## 2019-01-16 DIAGNOSIS — Z01812 Encounter for preprocedural laboratory examination: Secondary | ICD-10-CM | POA: Insufficient documentation

## 2019-01-16 LAB — SARS CORONAVIRUS 2 (TAT 6-24 HRS): SARS Coronavirus 2: NEGATIVE

## 2019-01-16 MED ORDER — CEFAZOLIN SODIUM-DEXTROSE 2-4 GM/100ML-% IV SOLN
2.0000 g | INTRAVENOUS | Status: AC
  Administered 2019-01-17: 2 g via INTRAVENOUS

## 2019-01-16 NOTE — Pre-Procedure Instructions (Addendum)
Instructed regarding carbohydrate drink, G2.

## 2019-01-16 NOTE — Patient Instructions (Addendum)
Your procedure is scheduled on: 01/17/2019 Thurs Report to Same Day Surgery 2nd floor medical mall [facility] Entrance-take elevator on left to 2nd floor.  Check in with surgery information desk.) To find out your arrival time please call [phone] between 1PM - 3PM on 01/16/2019 Wed  Remember: Instructions that are not followed completely may result in serious medical risk, up to and including death, or upon the discretion of your surgeon and anesthesiologist your surgery may need to be rescheduled.    _x___ 1. Do not eat food after midnight the night before your procedure. You may drink clear liquids up to 2 hours before you are scheduled to arrive at the hospital for your procedure.  Do not drink clear liquids within 2 hours of your scheduled arrival to the hospital.  Clear liquids include  --Water or Apple juice without pulp  --Clear carbohydrate beverage such as ClearFast or Gatorade  --Black Coffee or Clear Tea (No milk, no creamers, do not add anything to                  the coffee or Tea Type 1 and type 2 diabetics should only drink water.   ____Ensure clear carbohydrate drink on the way to the hospital for bariatric patients  _x___G2 clear carbohydrate drink 3 hours before surgery.   No gum chewing or hard candies.     __x__ 2. No Alcohol for 24 hours before or after surgery.   __x__3. No Smoking or e-cigarettes for 24 prior to surgery.  Do not use any chewable tobacco products for at least 6 hour prior to surgery   ____  4. Bring all medications with you on the day of surgery if instructed.    __x__ 5. Notify your doctor if there is any change in your medical condition     (cold, fever, infections).    x___6. On the morning of surgery brush your teeth with toothpaste and water.  You may rinse your mouth with mouth wash if you wish.  Do not swallow any toothpaste or mouthwash.   Do not wear jewelry, make-up, hairpins, clips or nail polish.  Do not wear lotions,  powders, or perfumes. You may wear deodorant.  Do not shave 48 hours prior to surgery. Men may shave face and neck.  Do not bring valuables to the hospital.    [facility] is not responsible for any belongings or valuables.               Contacts, dentures or bridgework may not be worn into surgery.  Leave your suitcase in the car. After surgery it may be brought to your room.  For patients admitted to the hospital, discharge time is determined by your                       treatment team.  _  Patients discharged the day of surgery will not be allowed to drive home.  You will need someone to drive you home and stay with you the night of your procedure.    Please read over the following fact sheets that you were given:   [facility] Preparing for Surgery and or MRSA Information   _x___ Take anti-hypertensive listed below, cardiac, seizure, asthma,     anti-reflux and psychiatric medicines. These include:  1. amLODipine (NORVASC) 5 MG tablet  2.gabapentin (NEURONTIN) 300 MG capsule  3.metoprolol succinate (TOPROL-XL) 100 MG 24 hr tablet  4.omeprazole (PRILOSEC) 20 MG capsule  tonight and in am  5.  6.  ____Fleets enema or Magnesium Citrate as directed.   _x___ Use dial Soap or sage wipes as directed on instruction sheet   ____ Use inhalers on the day of surgery and bring to hospital day of surgery  ____ Stop Metformin and Janumet 2 days prior to surgery.    ____ Take 1/2 of usual insulin dose the night before surgery and none on the morning     surgery.   _x___ Follow recommendations from Cardiologist, Pulmonologist or PCP regarding          stopping Aspirin, Coumadin, Plavix ,Eliquis, Effient, or Pradaxa, and Pletal.  X____Stop Anti-inflammatories such as Advil, Aleve, Ibuprofen, Motrin, Naproxen, Naprosyn, Goodies powders or aspirin products. OK to take Tylenol and                          Celebrex.   _x___ Stop supplements until after surgery.  But may continue Vitamin D, Vitamin  B,       and multivitamin.   ____ Bring C-Pap to the hospital.

## 2019-01-17 ENCOUNTER — Ambulatory Visit: Payer: Commercial Managed Care - PPO

## 2019-01-17 ENCOUNTER — Ambulatory Visit
Admission: RE | Admit: 2019-01-17 | Discharge: 2019-01-17 | Disposition: A | Discharge status: Home / Self Care | Payer: Commercial Managed Care - PPO | Attending: Orthopedic Surgery | Admitting: Orthopedic Surgery

## 2019-01-17 ENCOUNTER — Encounter
Admission: RE | Disposition: A | Discharge status: Home / Self Care | Payer: Self-pay | Source: Home / Self Care | Attending: Orthopedic Surgery

## 2019-01-17 ENCOUNTER — Ambulatory Visit: Payer: Commercial Managed Care - PPO | Admitting: Anesthesiology

## 2019-01-17 ENCOUNTER — Other Ambulatory Visit: Payer: Self-pay

## 2019-01-17 DIAGNOSIS — Z86010 Personal history of colonic polyps: Secondary | ICD-10-CM | POA: Diagnosis not present

## 2019-01-17 DIAGNOSIS — Z9049 Acquired absence of other specified parts of digestive tract: Secondary | ICD-10-CM | POA: Diagnosis not present

## 2019-01-17 DIAGNOSIS — Z794 Long term (current) use of insulin: Secondary | ICD-10-CM | POA: Insufficient documentation

## 2019-01-17 DIAGNOSIS — I129 Hypertensive chronic kidney disease with stage 1 through stage 4 chronic kidney disease, or unspecified chronic kidney disease: Secondary | ICD-10-CM | POA: Insufficient documentation

## 2019-01-17 DIAGNOSIS — S52571A Other intraarticular fracture of lower end of right radius, initial encounter for closed fracture: Secondary | ICD-10-CM | POA: Diagnosis not present

## 2019-01-17 DIAGNOSIS — N183 Chronic kidney disease, stage 3 unspecified: Secondary | ICD-10-CM | POA: Insufficient documentation

## 2019-01-17 DIAGNOSIS — Z79899 Other long term (current) drug therapy: Secondary | ICD-10-CM | POA: Diagnosis not present

## 2019-01-17 DIAGNOSIS — Z87891 Personal history of nicotine dependence: Secondary | ICD-10-CM | POA: Insufficient documentation

## 2019-01-17 DIAGNOSIS — S52531A Colles' fracture of right radius, initial encounter for closed fracture: Secondary | ICD-10-CM | POA: Insufficient documentation

## 2019-01-17 DIAGNOSIS — G2581 Restless legs syndrome: Secondary | ICD-10-CM | POA: Diagnosis not present

## 2019-01-17 DIAGNOSIS — W1839XA Other fall on same level, initial encounter: Secondary | ICD-10-CM | POA: Diagnosis not present

## 2019-01-17 DIAGNOSIS — Z833 Family history of diabetes mellitus: Secondary | ICD-10-CM | POA: Insufficient documentation

## 2019-01-17 DIAGNOSIS — G4700 Insomnia, unspecified: Secondary | ICD-10-CM | POA: Insufficient documentation

## 2019-01-17 DIAGNOSIS — Z823 Family history of stroke: Secondary | ICD-10-CM | POA: Insufficient documentation

## 2019-01-17 DIAGNOSIS — E1122 Type 2 diabetes mellitus with diabetic chronic kidney disease: Secondary | ICD-10-CM | POA: Insufficient documentation

## 2019-01-17 DIAGNOSIS — E1142 Type 2 diabetes mellitus with diabetic polyneuropathy: Secondary | ICD-10-CM | POA: Insufficient documentation

## 2019-01-17 DIAGNOSIS — Z8781 Personal history of (healed) traumatic fracture: Secondary | ICD-10-CM

## 2019-01-17 DIAGNOSIS — Z9889 Other specified postprocedural states: Secondary | ICD-10-CM

## 2019-01-17 HISTORY — PX: OPEN REDUCTION INTERNAL FIXATION (ORIF) DISTAL RADIAL FRACTURE: SHX5989

## 2019-01-17 LAB — GLUCOSE, CAPILLARY
Glucose-Capillary: 170 mg/dL — ABNORMAL HIGH (ref 70–99)
Glucose-Capillary: 133 mg/dL — ABNORMAL HIGH (ref 70–99)

## 2019-01-17 SURGERY — OPEN REDUCTION INTERNAL FIXATION (ORIF) DISTAL RADIUS FRACTURE
Anesthesia: General | Site: Wrist | Laterality: Right

## 2019-01-17 MED ORDER — OXYCODONE HCL 5 MG PO TABS: 5.0000 mg | ORAL_TABLET | Freq: Three times a day (TID) | ORAL | 0 refills | Status: DC | PRN

## 2019-01-17 MED ORDER — LIDOCAINE HCL (PF) 2 % IJ SOLN
INTRAMUSCULAR | Status: DC | PRN
  Administered 2019-01-17: 60 mg via PERINEURAL
  Administered 2019-01-17: 140 mg via PERINEURAL

## 2019-01-17 MED ORDER — FENTANYL CITRATE (PF) 100 MCG/2ML IJ SOLN
INTRAMUSCULAR | Status: DC | PRN
  Administered 2019-01-17 (×2): 50 ug via INTRAVENOUS

## 2019-01-17 MED ORDER — FENTANYL CITRATE (PF) 100 MCG/2ML IJ SOLN
50.0000 ug | Freq: Once | INTRAMUSCULAR | Status: AC
  Administered 2019-01-17: 10:00:00 50 ug via INTRAVENOUS

## 2019-01-17 MED ORDER — OXYCODONE HCL 5 MG PO TABS: 5.0000 mg | ORAL_TABLET | Freq: Once | ORAL | Status: DC | PRN

## 2019-01-17 MED ORDER — LIDOCAINE HCL (PF) 2 % IJ SOLN
INTRAMUSCULAR | Status: AC
  Filled 2019-01-17: qty 10

## 2019-01-17 MED ORDER — NEOMYCIN-POLYMYXIN B GU 40-200000 IR SOLN
Status: DC | PRN
  Administered 2019-01-17: 2 mL

## 2019-01-17 MED ORDER — MIDAZOLAM HCL 2 MG/2ML IJ SOLN: 1.0000 mg | Freq: Once | INTRAMUSCULAR | Status: DC

## 2019-01-17 MED ORDER — SUCCINYLCHOLINE CHLORIDE 20 MG/ML IJ SOLN
INTRAMUSCULAR | Status: DC | PRN
  Administered 2019-01-17: 200 mg via INTRAVENOUS

## 2019-01-17 MED ORDER — OXYCODONE HCL 5 MG/5ML PO SOLN: 5.0000 mg | Freq: Once | ORAL | Status: DC | PRN

## 2019-01-17 MED ORDER — DEXAMETHASONE SODIUM PHOSPHATE 10 MG/ML IJ SOLN
INTRAMUSCULAR | Status: AC
  Filled 2019-01-17: qty 1

## 2019-01-17 MED ORDER — BUPIVACAINE HCL (PF) 0.5 % IJ SOLN
INTRAMUSCULAR | Status: DC | PRN
  Administered 2019-01-17: 13 mL via PERINEURAL
  Administered 2019-01-17: 7 mL via PERINEURAL

## 2019-01-17 MED ORDER — MIDAZOLAM HCL 2 MG/2ML IJ SOLN
INTRAMUSCULAR | Status: AC
  Filled 2019-01-17: qty 2

## 2019-01-17 MED ORDER — CHLORHEXIDINE GLUCONATE 4 % EX LIQD: 60.0000 mL | Freq: Once | CUTANEOUS | Status: DC

## 2019-01-17 MED ORDER — PROPOFOL 10 MG/ML IV BOLUS
INTRAVENOUS | Status: DC | PRN
  Administered 2019-01-17: 50 mg via INTRAVENOUS

## 2019-01-17 MED ORDER — MIDAZOLAM HCL 2 MG/2ML IJ SOLN
INTRAMUSCULAR | Status: AC
  Administered 2019-01-17: 1 mg
  Filled 2019-01-17: qty 2

## 2019-01-17 MED ORDER — FENTANYL CITRATE (PF) 100 MCG/2ML IJ SOLN
INTRAMUSCULAR | Status: AC
  Administered 2019-01-17: 50 ug via INTRAVENOUS
  Filled 2019-01-17: qty 2

## 2019-01-17 MED ORDER — LIDOCAINE HCL (PF) 1 % IJ SOLN
INTRAMUSCULAR | Status: AC
  Filled 2019-01-17: qty 5

## 2019-01-17 MED ORDER — MIDAZOLAM HCL 2 MG/2ML IJ SOLN
INTRAMUSCULAR | Status: DC | PRN
  Administered 2019-01-17: 1 mg via INTRAVENOUS

## 2019-01-17 MED ORDER — CEFAZOLIN SODIUM-DEXTROSE 2-4 GM/100ML-% IV SOLN
INTRAVENOUS | Status: AC
  Filled 2019-01-17: qty 100

## 2019-01-17 MED ORDER — FENTANYL CITRATE (PF) 100 MCG/2ML IJ SOLN
INTRAMUSCULAR | Status: AC
  Filled 2019-01-17: qty 2

## 2019-01-17 MED ORDER — ONDANSETRON HCL 4 MG/2ML IJ SOLN
INTRAMUSCULAR | Status: AC
  Filled 2019-01-17: qty 2

## 2019-01-17 MED ORDER — PROPOFOL 500 MG/50ML IV EMUL
INTRAVENOUS | Status: DC | PRN
  Administered 2019-01-17: 75 ug/kg/min via INTRAVENOUS

## 2019-01-17 MED ORDER — DEXAMETHASONE SODIUM PHOSPHATE 10 MG/ML IJ SOLN
INTRAMUSCULAR | Status: DC | PRN
  Administered 2019-01-17: 5 mg via INTRAVENOUS

## 2019-01-17 MED ORDER — FENTANYL CITRATE (PF) 100 MCG/2ML IJ SOLN
25.0000 ug | INTRAMUSCULAR | Status: DC | PRN
  Administered 2019-01-17 (×2): 50 ug via INTRAVENOUS

## 2019-01-17 MED ORDER — SODIUM CHLORIDE 0.9 % IV SOLN
INTRAVENOUS | Status: DC
  Administered 2019-01-17: 10:00:00 via INTRAVENOUS

## 2019-01-17 MED ORDER — ONDANSETRON HCL 4 MG/2ML IJ SOLN
INTRAMUSCULAR | Status: DC | PRN
  Administered 2019-01-17: 4 mg via INTRAVENOUS

## 2019-01-17 MED ORDER — BUPIVACAINE HCL (PF) 0.5 % IJ SOLN
INTRAMUSCULAR | Status: AC
  Filled 2019-01-17: qty 20

## 2019-01-17 SURGICAL SUPPLY — 41 items
BIT DRILL 2 FAST STEP (BIT) ×2 IMPLANT
BIT DRILL 2.5X4 QC (BIT) ×2 IMPLANT
BNDG ELASTIC 4X5.8 VLCR STR LF (GAUZE/BANDAGES/DRESSINGS) ×3 IMPLANT
CANISTER SUCT 1200ML W/VALVE (MISCELLANEOUS) ×3 IMPLANT
CHLORAPREP W/TINT 26 (MISCELLANEOUS) ×3 IMPLANT
COVER WAND RF STERILE (DRAPES) ×3 IMPLANT
CUFF TOURN SGL QUICK 18X4 (TOURNIQUET CUFF) IMPLANT
DRAPE FLUOR MINI C-ARM 54X84 (DRAPES) ×3 IMPLANT
ELECT REM PT RETURN 9FT ADLT (ELECTROSURGICAL) ×3
ELECTRODE REM PT RTRN 9FT ADLT (ELECTROSURGICAL) ×1 IMPLANT
GAUZE SPONGE 4X4 12PLY STRL (GAUZE/BANDAGES/DRESSINGS) ×3 IMPLANT
GAUZE XEROFORM 1X8 LF (GAUZE/BANDAGES/DRESSINGS) ×6 IMPLANT
GLOVE SURG SYN 9.0  PF PI (GLOVE) ×2
GLOVE SURG SYN 9.0 PF PI (GLOVE) ×1 IMPLANT
GOWN SRG 2XL LVL 4 RGLN SLV (GOWNS) ×1 IMPLANT
GOWN STRL NON-REIN 2XL LVL4 (GOWNS) ×2
GOWN STRL REUS W/ TWL LRG LVL3 (GOWN DISPOSABLE) ×1 IMPLANT
GOWN STRL REUS W/TWL LRG LVL3 (GOWN DISPOSABLE) ×2
K-WIRE 1.6 (WIRE) ×2
K-WIRE FX5X1.6XNS BN SS (WIRE) ×1
KIT TURNOVER KIT A (KITS) ×3 IMPLANT
KWIRE FX5X1.6XNS BN SS (WIRE) IMPLANT
NDL FILTER BLUNT 18X1 1/2 (NEEDLE) ×1 IMPLANT
NEEDLE FILTER BLUNT 18X 1/2SAF (NEEDLE) ×2
NEEDLE FILTER BLUNT 18X1 1/2 (NEEDLE) ×1 IMPLANT
NS IRRIG 500ML POUR BTL (IV SOLUTION) ×3 IMPLANT
PACK EXTREMITY ARMC (MISCELLANEOUS) ×3 IMPLANT
PAD CAST CTTN 4X4 STRL (SOFTGOODS) ×2 IMPLANT
PADDING CAST COTTON 4X4 STRL (SOFTGOODS) ×4
PEG SUBCHONDRAL SMOOTH 2.0X16 (Peg) ×2 IMPLANT
PEG SUBCHONDRAL SMOOTH 2.0X18 (Peg) ×2 IMPLANT
PEG SUBCHONDRAL SMOOTH 2.0X20 (Peg) ×8 IMPLANT
PLATE SHORT 21.6X48.9 NRRW RT (Plate) ×2 IMPLANT
SCALPEL PROTECTED #15 DISP (BLADE) ×6 IMPLANT
SCREW CORT 3.5X10 LNG (Screw) ×6 IMPLANT
SPLINT CAST 1 STEP 3X12 (MISCELLANEOUS) ×3 IMPLANT
SUT ETHILON 4-0 (SUTURE) ×2
SUT ETHILON 4-0 FS2 18XMFL BLK (SUTURE) ×1
SUT VICRYL 3-0 27IN (SUTURE) ×3 IMPLANT
SUTURE ETHLN 4-0 FS2 18XMF BLK (SUTURE) ×1 IMPLANT
SYR 3ML LL SCALE MARK (SYRINGE) ×3 IMPLANT

## 2019-01-17 NOTE — Anesthesia Post-op Follow-up Note (Signed)
Anesthesia QCDR form completed.        

## 2019-01-17 NOTE — Anesthesia Preprocedure Evaluation (Signed)
Anesthesia Evaluation  Patient identified by MRN, date of birth, ID band Patient awake    Reviewed: Allergy & Precautions, H&P , NPO status , Patient's Chart, lab work & pertinent test results  History of Anesthesia Complications (+) history of anesthetic complications  Airway Mallampati: III  TM Distance: <3 FB Neck ROM: limited    Dental  (+) Chipped, Poor Dentition   Pulmonary neg shortness of breath, former smoker,           Cardiovascular Exercise Tolerance: Good hypertension, (-) angina(-) Past MI and (-) DOE      Neuro/Psych PSYCHIATRIC DISORDERS  Neuromuscular disease    GI/Hepatic Neg liver ROS, GERD  Medicated and Controlled,  Endo/Other  diabetes, Type 2, Insulin Dependent  Renal/GU Renal disease  negative genitourinary   Musculoskeletal  (+) Arthritis ,   Abdominal   Peds  Hematology negative hematology ROS (+)   Anesthesia Other Findings Past Medical History: No date: Acute bronchitis No date: Allergy No date: Arthritis No date: Complication of anesthesia     Comment:  difficult to wake up No date: Diabetes mellitus without complication (HCC)     Comment:  type 2 No date: Diarrhea No date: Esophagitis No date: Excessive menstruation No date: GERD (gastroesophageal reflux disease) No date: Hyperlipidemia No date: Hypertension     Comment:  CONTROLLED ON MEDS No date: Long term current use of insulin (HCC) No date: Neuropathy     Comment:  left leg and feet No date: Shortness of breath dyspnea     Comment:  upon exertion No date: Wears partial dentures     Comment:  upper / does not wear  Past Surgical History: No date: CHOLECYSTECTOMY 01/12/2015: COLONOSCOPY WITH PROPOFOL; N/A     Comment:  Procedure: COLONOSCOPY WITH PROPOFOL;  Surgeon: Lucilla Lame, MD;  Location: [place];  Service:               Endoscopy;  Laterality: N/A;  DIABETIC-INSULIN  DEPENDENT 01/12/2015: POLYPECTOMY     Comment:  Procedure: POLYPECTOMY;  Surgeon: Lucilla Lame, MD;                Location: [place];  Service: Endoscopy;; No date: WRIST SURGERY; Right     Comment:  ganglion cyst  BMI    Body Mass Index: 33.20 kg/m      Reproductive/Obstetrics negative OB ROS                             Anesthesia Physical Anesthesia Plan  ASA: III  Anesthesia Plan: General   Post-op Pain Management: GA combined w/ Regional for post-op pain   Induction: Intravenous  PONV Risk Score and Plan: Propofol infusion and TIVA  Airway Management Planned: Natural Airway and Nasal Cannula  Additional Equipment:   Intra-op Plan:   Post-operative Plan:   Informed Consent: I have reviewed the patients History and Physical, chart, labs and discussed the procedure including the risks, benefits and alternatives for the proposed anesthesia with the patient or authorized representative who has indicated his/her understanding and acceptance.     Dental Advisory Given  Plan Discussed with: Anesthesiologist, CRNA and Surgeon  Anesthesia Plan Comments: (Patient consented for risks of anesthesia including but not limited to:  - adverse reactions to medications - risk of intubation if required - damage to teeth, lips or other oral mucosa -  sore throat or hoarseness - Damage to heart, brain, lungs or loss of life  Patient voiced understanding.)        Anesthesia Quick Evaluation

## 2019-01-17 NOTE — Anesthesia Procedure Notes (Signed)
Procedure Name: Intubation Date/Time: 01/17/2019 11:47 AM Performed by: Chanetta Marshall, CRNA Pre-anesthesia Checklist: Patient identified, Emergency Drugs available, Suction available and Patient being monitored Patient Re-evaluated:Patient Re-evaluated prior to induction Oxygen Delivery Method: Circle system utilized Preoxygenation: Pre-oxygenation with 100% oxygen Induction Type: IV induction Ventilation: Mask ventilation without difficulty Laryngoscope Size: McGraph and 3 Tube type: Oral Number of attempts: 1 Airway Equipment and Method: Stylet and Oral airway Placement Confirmation: ETT inserted through vocal cords under direct vision,  positive ETCO2 and breath sounds checked- equal and bilateral Secured at: 21 cm Tube secured with: Tape Dental Injury: Teeth and Oropharynx as per pre-operative assessment

## 2019-01-17 NOTE — Anesthesia Postprocedure Evaluation (Signed)
Anesthesia Post Note  Patient: [name]  Procedure(s) Performed: OPEN REDUCTION INTERNAL FIXATION (ORIF) DISTAL RADIAL FRACTURE (Right Wrist)  Patient location during evaluation: PACU Anesthesia Type: General Level of consciousness: awake and alert Pain management: pain level controlled Vital Signs Assessment: post-procedure vital signs reviewed and stable Respiratory status: spontaneous breathing, nonlabored ventilation, respiratory function stable and patient connected to nasal cannula oxygen Cardiovascular status: blood pressure returned to baseline and stable Postop Assessment: no apparent nausea or vomiting Anesthetic complications: no     Last Vitals:  Vitals:   01/17/19 1315 01/17/19 1331  BP: (!) 146/67 (!) 160/81  Pulse: 82 91  Resp: 14 18  Temp:  37.2 C  SpO2: 93% 93%    Last Pain:  Vitals:   01/17/19 1331  TempSrc: Temporal  PainSc: 4                  Precious Haws Piscitello

## 2019-01-17 NOTE — Op Note (Signed)
01/17/2019  12:21 PM  PATIENT:  April Johnson  63 y.o. female  PRE-OPERATIVE DIAGNOSIS:  CLOSED COLLES FRACTURE OF RIGHT RADIUS, 2 distal fragments, intra-articular  POST-OPERATIVE DIAGNOSIS:  CLOSED COLLES FRACTURE OF RIGHT RADIUS, same  PROCEDURE:  Procedure(s): OPEN REDUCTION INTERNAL FIXATION (ORIF) DISTAL RADIAL FRACTURE (Right)  SURGEON: Laurene Footman, MD  ASSISTANTS: None  ANESTHESIA:   regional and general  EBL:  Total I/O In: -  Out: 3 [Blood:3]  BLOOD ADMINISTERED:none  DRAINS: none   LOCAL MEDICATIONS USED:  NONE  SPECIMEN:  No Specimen  DISPOSITION OF SPECIMEN:  N/A  COUNTS:  YES  TOURNIQUET:   Total Tourniquet Time Documented: Upper Arm (Right) - 19 minutes Total: Upper Arm (Right) - 19 minutes   IMPLANTS: [employer] hand [employer] short narrow DVR plate with smooth pegs and screws  DICTATION: .Dragon Dictation  Patient was brought to the operating room after a block was attempted.  The block was not complete at the start of the case so LMA is placed but during the case.  The block was working.  After prepping and draping the usual sterile fashion appropriate patient identification and timeout procedure were completed.  Tourniquet was raised to 250 mmHg.  Incision was made over the FCR tendon and the tendon sheath was incised and retracted radially protect the radial artery and associated veins.  The deep fascia was incised and muscles retracted on either side to expose the pronator.  It is been partially torn off the distal fragment and was elevated off the radial side to expose the fracture and distal shaft.  Fingertrap traction was applied with about 10 pounds of traction to help regain length but the dorsal tilt remained.  Because of this a distal first approach was performed with the plate attached to the distal fragment pinned into position and when it was checked under the mini C arm and appropriately placed all the smooth pegs were placed using  standard technique of drilling measuring and placing the smooth pegs through the fast gut with fast guides in place.  After all the 6 distal smooth pegs were placed the shaft was brought down to the the plate was brought onto the shaft and 310 mm screws placed.  This restored the volar tilt.  Radial inclination and length were also appropriate.  The tourniquet was let down and the wound irrigated.  The wound was closed with 3-0 Vicryl in a subcuticular fashion and 4-0 nylon in a simple erupted fashion of the skin.  Xeroform 4 x 4 web roll volar splint and Ace wrap were applied.   PLAN OF CARE: Discharge to home after PACU  PATIENT DISPOSITION:  PACU - hemodynamically stable.

## 2019-01-17 NOTE — Transfer of Care (Signed)
Immediate Anesthesia Transfer of Care Note  Patient: April Johnson  Procedure(s) Performed: OPEN REDUCTION INTERNAL FIXATION (ORIF) DISTAL RADIAL FRACTURE (Right Wrist)  Patient Location: PACU  Anesthesia Type:General  Level of Consciousness: awake, alert  and oriented  Airway & Oxygen Therapy: Patient Spontanous Breathing and Patient connected to nasal cannula oxygen  Post-op Assessment: Report given to RN and Post -op Vital signs reviewed and stable  Post vital signs: Reviewed and stable  Last Vitals:  Vitals Value Taken Time  BP    Temp    Pulse    Resp    SpO2      Last Pain:  Vitals:   01/17/19 1019  TempSrc:   PainSc: 0-No pain         Complications: No apparent anesthesia complications

## 2019-01-17 NOTE — H&P (Signed)
Reviewed paper H+P, will be scanned into chart. No changes noted.  

## 2019-01-17 NOTE — Discharge Instructions (Addendum)
°  Keep arm elevated is much as you can.  Work on finger motion immediately.  Pain medicine as directed.  Leave dressing clean and dry until return visit   [place]   1) The drugs that you were given will stay in your system until tomorrow so for the next 24 hours you should not:  A) Drive an automobile B) Make any legal decisions C) Drink any alcoholic beverage   2) You may resume regular meals tomorrow.  Today it is better to start with liquids and gradually work up to solid foods.  You may eat anything you prefer, but it is better to start with liquids, then soup and crackers, and gradually work up to solid foods.   3) Please notify your doctor immediately if you have any unusual bleeding, trouble breathing, redness and pain at the surgery site, drainage, fever, or pain not relieved by medication.    4) Additional Instructions:        Please contact your physician with any problems or Same Day Surgery at [phone], Monday through Friday 6 am to 4 pm, or [place] [facility] number at [phone].

## 2019-01-17 NOTE — Anesthesia Procedure Notes (Signed)
Anesthesia Regional Block: Supraclavicular block   Pre-Anesthetic Checklist: ,, timeout performed, Correct Patient, Correct Site, Correct Laterality, Correct Procedure, Correct Position, site marked, Risks and benefits discussed,  Surgical consent,  Pre-op evaluation,  At surgeon's request and post-op pain management  Laterality: Upper and Right  Prep: chloraprep       Needles:  Injection technique: Single-shot  Needle Type: Stimiplex     Needle Length: 5cm  Needle Gauge: 22     Additional Needles:   Procedures:,,,, ultrasound used (permanent image in chart),,,,  Narrative:  Start time: 01/17/2019 10:10 AM End time: 01/17/2019 10:16 AM Injection made incrementally with aspirations every 5 mL.  Performed by: Personally  Anesthesiologist: Piscitello, Precious Haws, MD  Additional Notes: Patient consented for risk and benefits of nerve block including but not limited to nerve damage, failed block, bleeding and infection.  Patient voiced understanding.  Functioning IV was confirmed and monitors were applied.  A 74mm 22ga Stimuplex needle was used. Sterile prep,hand hygiene and sterile gloves were used.  Minimal sedation used for procedure.  No paresthesia endorsed by patient during the procedure.  Negative aspiration and negative test dose prior to incremental administration of local anesthetic. The patient tolerated the procedure well with no immediate complications.

## 2019-01-18 ENCOUNTER — Encounter: Payer: Self-pay | Admitting: Orthopedic Surgery

## 2019-01-23 ENCOUNTER — Other Ambulatory Visit: Payer: Self-pay

## 2019-01-24 ENCOUNTER — Ambulatory Visit: Payer: Commercial Managed Care - PPO | Admitting: Unknown Physician Specialty

## 2019-02-05 ENCOUNTER — Other Ambulatory Visit: Payer: Self-pay

## 2019-02-05 ENCOUNTER — Encounter: Payer: Self-pay | Admitting: Nurse Practitioner

## 2019-02-05 ENCOUNTER — Ambulatory Visit (INDEPENDENT_AMBULATORY_CARE_PROVIDER_SITE_OTHER): Payer: Commercial Managed Care - PPO | Admitting: Nurse Practitioner

## 2019-02-05 DIAGNOSIS — E1142 Type 2 diabetes mellitus with diabetic polyneuropathy: Secondary | ICD-10-CM | POA: Diagnosis not present

## 2019-02-05 DIAGNOSIS — E1169 Type 2 diabetes mellitus with other specified complication: Secondary | ICD-10-CM

## 2019-02-05 DIAGNOSIS — N1831 Chronic kidney disease, stage 3a: Secondary | ICD-10-CM

## 2019-02-05 DIAGNOSIS — Z794 Long term (current) use of insulin: Secondary | ICD-10-CM

## 2019-02-05 DIAGNOSIS — N182 Chronic kidney disease, stage 2 (mild): Secondary | ICD-10-CM

## 2019-02-05 DIAGNOSIS — E1122 Type 2 diabetes mellitus with diabetic chronic kidney disease: Secondary | ICD-10-CM

## 2019-02-05 DIAGNOSIS — E785 Hyperlipidemia, unspecified: Secondary | ICD-10-CM

## 2019-02-05 DIAGNOSIS — I1310 Hypertensive heart and chronic kidney disease without heart failure, with stage 1 through stage 4 chronic kidney disease, or unspecified chronic kidney disease: Secondary | ICD-10-CM | POA: Diagnosis not present

## 2019-02-05 MED ORDER — ONDANSETRON HCL 4 MG PO TABS: ORAL_TABLET | ORAL | 1 refills | Status: DC

## 2019-02-05 NOTE — Assessment & Plan Note (Signed)
Chronic, ongoing with last A1C 6.6%, making her at goal for over a year now.  Continue Metformin 1000 MG BID, with goal of insulin reduction and possible discontinuation in future.  Reiterated importance of decreasing Levemir by 3 units every 3 days based on morning blood sugar readings. Continue Ozempic and Farxiga + Gabapentin for neuropathy. Will obtain outpatient labs. Return in 3 months.

## 2019-02-05 NOTE — Progress Notes (Signed)
LMP  (LMP Unknown)    Subjective:    [name], female    DOB: May 01, 1955, 63 y.o.   MRN: [id number]  HPI: April Johnson is a 63 y.o. female  Chief Complaint  Patient presents with  . Diabetes    . This visit was completed via Doximity due to the restrictions of the COVID-19 pandemic. All issues as above were discussed and addressed. Physical exam was done as above through visual confirmation on Doximity. If it was felt that the patient should be evaluated in the office, they were directed there. The patient verbally consented to this visit. . Location of the patient: home . Location of the provider: work . Those involved with this call:  . Provider: Marnee Guarneri, DNP . CMA: Yvonna Alanis, CMA . Front Desk/Registration: Jill Side  . Time spent on call: 15 minutes with patient face to face via video conference. More than 50% of this time was spent in counseling and coordination of care. 10 minutes total spent in review of patient's record and preparation of their chart.  . I verified patient identity using two factors (patient name and date of birth). Patient consents verbally to being seen via telemedicine visit today.    DIABETES Taking Levemir 60 units every other night, sometimes less.  Also taking Ozempic 1MG , Metformin 500 MG BID, Farxiga 5 MG QDAY.  Last A1C was 6.6% and provider had recommended continued reduction of insulin to avoid hypoglycemia.  Discussed with her at last visit whether she had ever taken Metformin 1000 MG BID, which she reports she has not.  She agrees with trial off Actos and increasing Metformin to max dose + continuing reduction of insulin, with goal to discontinue completely if able.  Reiterated importance of decreasing Levemir by 2 units every 3 days based on morning blood sugar readings.  Takes her Gabapentin twice a day for neuropathy, helps her rest. Hypoglycemic episodes:no Polydipsia/polyuria: no Visual  disturbance: no Chest pain: no Paresthesias: no Glucose Monitoring: yes             Accucheck frequency: Daily             Fasting glucose: 115 -125             Post prandial:             Evening:             Before meals: Taking Insulin?: yes             Long acting insulin: 30 units every other day (has come down as previously was taking 60 units -- does not take every day, takes once every other day)             Short acting insulin: Blood Pressure Monitoring: not checking Retinal Examination: Not up to Date Foot Exam: Up to Date Pneumovax: Up to Date Influenza: Up to Date Aspirin: yes   HYPERTENSION / HYPERLIPIDEMIA WITH CKD Continues on Amlodipine, ASA, Lipitor, Metoprolol, and Micardis.   Satisfied with current treatment? yes Duration of hypertension: chronic BP monitoring frequency: not checking BP range:  BP medication side effects: no Duration of hyperlipidemia: chronic Cholesterol medication side effects: no Cholesterol supplements: none Medication compliance: good compliance Aspirin: yes Recent stressors: no Recurrent headaches: no Visual changes: no Palpitations: no Dyspnea: no Chest pain: no Lower extremity edema: no Dizzy/lightheaded: no  Relevant past medical, surgical, family and social history reviewed and updated as indicated. Interim medical history  since our last visit reviewed. Allergies and medications reviewed and updated.  Review of Systems  Constitutional: Negative for activity change, appetite change, diaphoresis, fatigue and fever.  Respiratory: Negative for cough, chest tightness and shortness of breath.   Cardiovascular: Negative for chest pain, palpitations and leg swelling.  Gastrointestinal: Negative for abdominal distention, abdominal pain, constipation, diarrhea, nausea and vomiting.  Endocrine: Negative for cold intolerance, heat intolerance, polydipsia, polyphagia and polyuria.  Neurological: Negative for dizziness, syncope,  weakness, light-headedness, numbness and headaches.  Psychiatric/Behavioral: Negative.     Per HPI unless specifically indicated above     Objective:    LMP  (LMP Unknown)   Wt Readings from Last 3 Encounters:  01/17/19 196 lb 7.6 oz (89.1 kg)  01/16/19 198 lb (89.8 kg)  01/12/19 197 lb (89.4 kg)    Physical Exam Vitals signs and nursing note reviewed.  Constitutional:      General: She is awake. She is not in acute distress.    Appearance: She is well-developed. She is not ill-appearing.  HENT:     Head: Normocephalic.     Right Ear: Hearing normal.     Left Ear: Hearing normal.  Eyes:     General: Lids are normal.        Right eye: No discharge.        Left eye: No discharge.     Conjunctiva/sclera: Conjunctivae normal.  Neck:     Musculoskeletal: Normal range of motion.  Pulmonary:     Effort: Pulmonary effort is normal. No accessory muscle usage or respiratory distress.  Neurological:     Mental Status: She is alert and oriented to person, place, and time.  Psychiatric:        Attention and Perception: Attention normal.        Mood and Affect: Mood normal.        Behavior: Behavior normal. Behavior is cooperative.        Thought Content: Thought content normal.        Judgment: Judgment normal.     Results for orders placed or performed during the hospital encounter of 01/17/19  Glucose, capillary  Result Value Ref Range   Glucose-Capillary 170 (H) 70 - 99 mg/dL  Glucose, capillary  Result Value Ref Range   Glucose-Capillary 133 (H) 70 - 99 mg/dL      Assessment & Plan:   Problem List Items Addressed This Visit      Cardiovascular and Mediastinum   Hypertensive heart/kidney disease without HF and with CKD stage III    Chronic, ongoing with BP at goal on recent visits. Recommend she obtain BP cuff and check at least 3 times a week at home.  Continue to monitor kidney function and if GFR < 45 consider d/c Farxiga or reduction Metformin.  Obtain outpatient  labs.  Return in 3 months.      Relevant Orders   Comprehensive metabolic panel   95m Lipid Panel     Endocrine   Diabetes mellitus ([place]) - Primary    Chronic, ongoing with last A1C 6.6%, making her at goal for over a year now.  Continue Metformin 1000 MG BID, with goal of insulin reduction and possible discontinuation in future.  Reiterated importance of decreasing Levemir by 3 units every 3 days based on morning blood sugar readings. Continue Ozempic and [place]. Will obtain outpatient labs. Return in 3 months.      Hyperlipidemia associated with type 2 diabetes mellitus (HCC)    Chronic, ongoing.  Continue current medication regimen and adjust as needed based on labs.  Obtain outpatient lipid and CMP.      Relevant Orders   Bayer DCA Hb A1c Waived   43m Lipid Panel   Diabetic peripheral neuropathy (HCC)    Chronic, ongoing with last A1C 6.6%, making her at goal for over a year now.  Continue Metformin 1000 MG BID, with goal of insulin reduction and possible discontinuation in future.  Reiterated importance of decreasing Levemir by 3 units every 3 days based on morning blood sugar readings. Continue Ozempic and Farxiga + Gabapentin for neuropathy. Will obtain outpatient labs. Return in 3 months.      Relevant Orders   Bayer DCA Hb A1c Waived     Other   Insulin long-term use ([place])    Continue to recommend reduction of this at home by 3 units every 3 days, with goal to discontinue in future if A1C remains <7.         I discussed the assessment and treatment plan with the patient. The patient was provided an opportunity to ask questions and all were answered. The patient agreed with the plan and demonstrated an understanding of the instructions.   The patient was advised to call back or seek an in-person evaluation if the symptoms worsen or if the condition fails to improve as anticipated.   I provided 15 minutes of time during this encounter.  Follow up plan: Return in about  3 months (around 05/06/2019) for Annual physical with diabetes check.

## 2019-02-05 NOTE — Assessment & Plan Note (Signed)
Chronic, ongoing.  Continue current medication regimen and adjust as needed based on labs.  Obtain outpatient lipid and CMP.

## 2019-02-05 NOTE — Patient Instructions (Signed)
Carbohydrate Counting for Diabetes Mellitus, Adult  Carbohydrate counting is a method of keeping track of how many carbohydrates you eat. Eating carbohydrates naturally increases the amount of sugar (glucose) in the blood. Counting how many carbohydrates you eat helps keep your blood glucose within normal limits, which helps you manage your diabetes (diabetes mellitus). It is important to know how many carbohydrates you can safely have in each meal. This is different for every person. A diet and nutrition specialist (registered dietitian) can help you make a meal plan and calculate how many carbohydrates you should have at each meal and snack. Carbohydrates are found in the following foods:  Grains, such as breads and cereals.  Dried beans and soy products.  Starchy vegetables, such as potatoes, peas, and corn.  Fruit and fruit juices.  Milk and yogurt.  Sweets and snack foods, such as cake, cookies, candy, chips, and soft drinks. How do I count carbohydrates? There are two ways to count carbohydrates in food. You can use either of the methods or a combination of both. Reading "Nutrition Facts" on packaged food The "Nutrition Facts" list is included on the labels of almost all packaged foods and beverages in the [place] It includes:  The serving size.  Information about nutrients in each serving, including the grams (g) of carbohydrate per serving. To use the "Nutrition Facts":  Decide how many servings you will have.  Multiply the number of servings by the number of carbohydrates per serving.  The resulting number is the total amount of carbohydrates that you will be having. Learning standard serving sizes of other foods When you eat carbohydrate foods that are not packaged or do not include "Nutrition Facts" on the label, you need to measure the servings in order to count the amount of carbohydrates:  Measure the foods that you will eat with a food scale or measuring cup, if needed.   Decide how many standard-size servings you will eat.  Multiply the number of servings by 15. Most carbohydrate-rich foods have about 15 g of carbohydrates per serving. ? For example, if you eat 8 oz (170 g) of strawberries, you will have eaten 2 servings and 30 g of carbohydrates (2 servings x 15 g = 30 g).  For foods that have more than one food mixed, such as soups and casseroles, you must count the carbohydrates in each food that is included. The following list contains standard serving sizes of common carbohydrate-rich foods. Each of these servings has about 15 g of carbohydrates:   hamburger bun or  English muffin.   oz (15 mL) syrup.   oz (14 g) jelly.  1 slice of bread.  1 six-inch tortilla.  3 oz (85 g) cooked rice or pasta.  4 oz (113 g) cooked dried beans.  4 oz (113 g) starchy vegetable, such as peas, corn, or potatoes.  4 oz (113 g) hot cereal.  4 oz (113 g) mashed potatoes or  of a large baked potato.  4 oz (113 g) canned or frozen fruit.  4 oz (120 mL) fruit juice.  4-6 crackers.  6 chicken nuggets.  6 oz (170 g) unsweetened dry cereal.  6 oz (170 g) plain fat-free yogurt or yogurt sweetened with artificial sweeteners.  8 oz (240 mL) milk.  8 oz (170 g) fresh fruit or one small piece of fruit.  24 oz (680 g) popped popcorn. Example of carbohydrate counting Sample meal  3 oz (85 g) chicken breast.  6 oz (170 g)   brown rice.  4 oz (113 g) corn.  8 oz (240 mL) milk.  8 oz (170 g) strawberries with sugar-free whipped topping. Carbohydrate calculation 1. Identify the foods that contain carbohydrates: ? Rice. ? Corn. ? Milk. ? Strawberries. 2. Calculate how many servings you have of each food: ? 2 servings rice. ? 1 serving corn. ? 1 serving milk. ? 1 serving strawberries. 3. Multiply each number of servings by 15 g: ? 2 servings rice x 15 g = 30 g. ? 1 serving corn x 15 g = 15 g. ? 1 serving milk x 15 g = 15 g. ? 1 serving  strawberries x 15 g = 15 g. 4. Add together all of the amounts to find the total grams of carbohydrates eaten: ? 30 g + 15 g + 15 g + 15 g = 75 g of carbohydrates total. Summary  Carbohydrate counting is a method of keeping track of how many carbohydrates you eat.  Eating carbohydrates naturally increases the amount of sugar (glucose) in the blood.  Counting how many carbohydrates you eat helps keep your blood glucose within normal limits, which helps you manage your diabetes.  A diet and nutrition specialist (registered dietitian) can help you make a meal plan and calculate how many carbohydrates you should have at each meal and snack. This information is not intended to replace advice given to you by your health care provider. Make sure you discuss any questions you have with your health care provider. Document Released: 02/21/2005 Document Revised: 09/15/2016 Document Reviewed: 08/05/2015 Elsevier Patient Education  2020 [employer]  

## 2019-02-05 NOTE — Assessment & Plan Note (Signed)
Chronic, ongoing with BP at goal on recent visits. Recommend she obtain BP cuff and check at least 3 times a week at home.  Continue to monitor kidney function and if GFR < 45 consider d/c Farxiga or reduction Metformin.  Obtain outpatient labs.  Return in 3 months.

## 2019-02-05 NOTE — Assessment & Plan Note (Addendum)
Chronic, ongoing with last A1C 6.6%, making her at goal for over a year now.  Continue Metformin 1000 MG BID, with goal of insulin reduction and possible discontinuation in future.  Reiterated importance of decreasing Levemir by 3 units every 3 days based on morning blood sugar readings. Continue Ozempic and [place]. Will obtain outpatient labs. Return in 3 months.

## 2019-02-05 NOTE — Assessment & Plan Note (Signed)
Continue to recommend reduction of this at home by 3 units every 3 days, with goal to discontinue in future if A1C remains <7. 

## 2019-02-15 ENCOUNTER — Other Ambulatory Visit: Payer: Self-pay

## 2019-02-15 ENCOUNTER — Other Ambulatory Visit: Payer: Commercial Managed Care - PPO

## 2019-02-15 DIAGNOSIS — E785 Hyperlipidemia, unspecified: Secondary | ICD-10-CM

## 2019-02-15 DIAGNOSIS — E1142 Type 2 diabetes mellitus with diabetic polyneuropathy: Secondary | ICD-10-CM

## 2019-02-15 DIAGNOSIS — I1310 Hypertensive heart and chronic kidney disease without heart failure, with stage 1 through stage 4 chronic kidney disease, or unspecified chronic kidney disease: Secondary | ICD-10-CM

## 2019-02-15 DIAGNOSIS — R300 Dysuria: Secondary | ICD-10-CM

## 2019-02-15 DIAGNOSIS — E1169 Type 2 diabetes mellitus with other specified complication: Secondary | ICD-10-CM

## 2019-02-15 LAB — UA/M W/RFLX CULTURE, ROUTINE
Bilirubin, UA: NEGATIVE
Ketones, UA: NEGATIVE
Leukocytes,UA: NEGATIVE
Nitrite, UA: NEGATIVE
Protein,UA: NEGATIVE
RBC, UA: NEGATIVE
Specific Gravity, UA: 1.01 (ref 1.005–1.030)
Urobilinogen, Ur: 0.2 mg/dL (ref 0.2–1.0)
pH, UA: 5.5 (ref 5.0–7.5)

## 2019-02-15 LAB — LIPID PANEL PICCOLO, WAIVED
Chol/HDL Ratio Piccolo,Waive: 2.6 mg/dL
Cholesterol Piccolo, Waived: 130 mg/dL (ref <200)
HDL Chol Piccolo, Waived: 49 mg/dL — ABNORMAL LOW (ref >59)
LDL Chol Calc Piccolo Waived: 28 mg/dL (ref <100)
Triglycerides Piccolo,Waived: 261 mg/dL — ABNORMAL HIGH (ref <150)
VLDL Chol Calc Piccolo,Waive: 52 mg/dL — ABNORMAL HIGH (ref <30)

## 2019-02-15 LAB — BAYER DCA HB A1C WAIVED: HB A1C (BAYER DCA - WAIVED): 6.7 % (ref <7.0)

## 2019-02-16 LAB — SPECIMEN STATUS

## 2019-02-19 LAB — COMPREHENSIVE METABOLIC PANEL
ALT: 17 IU/L (ref 0–32)
AST: 15 IU/L (ref 0–40)
Albumin/Globulin Ratio: 1.7 (ref 1.2–2.2)
Albumin: 4.3 g/dL (ref 3.8–4.8)
Alkaline Phosphatase: 128 IU/L — ABNORMAL HIGH (ref 39–117)
BUN/Creatinine Ratio: 19 (ref 12–28)
BUN: 15 mg/dL (ref 8–27)
Bilirubin Total: 0.5 mg/dL (ref 0.0–1.2)
CO2: 24 mmol/L (ref 20–29)
Calcium: 9.9 mg/dL (ref 8.7–10.3)
Chloride: 101 mmol/L (ref 96–106)
Creatinine, Ser: 0.77 mg/dL (ref 0.57–1.00)
GFR calc Af Amer: 96 mL/min/{1.73_m2} (ref >59)
GFR calc non Af Amer: 83 mL/min/{1.73_m2} (ref >59)
Globulin, Total: 2.5 g/dL (ref 1.5–4.5)
Glucose: 121 mg/dL — ABNORMAL HIGH (ref 65–99)
Potassium: 4.7 mmol/L (ref 3.5–5.2)
Sodium: 141 mmol/L (ref 134–144)
Total Protein: 6.8 g/dL (ref 6.0–8.5)

## 2019-02-27 ENCOUNTER — Other Ambulatory Visit: Payer: Self-pay | Admitting: Nurse Practitioner

## 2019-02-27 NOTE — Telephone Encounter (Signed)
Forwarding medication refill requests to PCP for review. 

## 2019-06-01 ENCOUNTER — Encounter: Payer: Self-pay | Admitting: Nurse Practitioner

## 2019-06-03 ENCOUNTER — Ambulatory Visit (INDEPENDENT_AMBULATORY_CARE_PROVIDER_SITE_OTHER): Payer: Commercial Managed Care - PPO | Admitting: Nurse Practitioner

## 2019-06-03 ENCOUNTER — Encounter: Payer: Self-pay | Admitting: Nurse Practitioner

## 2019-06-03 ENCOUNTER — Other Ambulatory Visit: Payer: Self-pay

## 2019-06-03 VITALS — BP 130/80 | HR 78 | Temp 98.5°F | Ht 64.1 in | Wt 195.4 lb

## 2019-06-03 DIAGNOSIS — E1122 Type 2 diabetes mellitus with diabetic chronic kidney disease: Secondary | ICD-10-CM | POA: Diagnosis not present

## 2019-06-03 DIAGNOSIS — Z794 Long term (current) use of insulin: Secondary | ICD-10-CM

## 2019-06-03 DIAGNOSIS — K209 Esophagitis, unspecified without bleeding: Secondary | ICD-10-CM

## 2019-06-03 DIAGNOSIS — E1159 Type 2 diabetes mellitus with other circulatory complications: Secondary | ICD-10-CM | POA: Diagnosis not present

## 2019-06-03 DIAGNOSIS — E6609 Other obesity due to excess calories: Secondary | ICD-10-CM

## 2019-06-03 DIAGNOSIS — Z6833 Body mass index (BMI) 33.0-33.9, adult: Secondary | ICD-10-CM

## 2019-06-03 DIAGNOSIS — Z1231 Encounter for screening mammogram for malignant neoplasm of breast: Secondary | ICD-10-CM

## 2019-06-03 DIAGNOSIS — E538 Deficiency of other specified B group vitamins: Secondary | ICD-10-CM

## 2019-06-03 DIAGNOSIS — E1142 Type 2 diabetes mellitus with diabetic polyneuropathy: Secondary | ICD-10-CM | POA: Diagnosis not present

## 2019-06-03 DIAGNOSIS — E1169 Type 2 diabetes mellitus with other specified complication: Secondary | ICD-10-CM | POA: Diagnosis not present

## 2019-06-03 DIAGNOSIS — I10 Essential (primary) hypertension: Secondary | ICD-10-CM

## 2019-06-03 DIAGNOSIS — E669 Obesity, unspecified: Secondary | ICD-10-CM | POA: Insufficient documentation

## 2019-06-03 DIAGNOSIS — Z0000 Encounter for general adult medical examination without abnormal findings: Secondary | ICD-10-CM | POA: Diagnosis not present

## 2019-06-03 DIAGNOSIS — N182 Chronic kidney disease, stage 2 (mild): Secondary | ICD-10-CM

## 2019-06-03 DIAGNOSIS — E785 Hyperlipidemia, unspecified: Secondary | ICD-10-CM

## 2019-06-03 DIAGNOSIS — N1831 Chronic kidney disease, stage 3a: Secondary | ICD-10-CM

## 2019-06-03 LAB — MICROALBUMIN, URINE WAIVED
Creatinine, Urine Waived: 10 mg/dL (ref 10–300)
Microalb, Ur Waived: 10 mg/L (ref 0–19)
Microalb/Creat Ratio: <30 mg/g (ref <30)

## 2019-06-03 LAB — BAYER DCA HB A1C WAIVED: HB A1C (BAYER DCA - WAIVED): 6.5 % (ref <7.0)

## 2019-06-03 NOTE — Assessment & Plan Note (Signed)
Chronic, ongoing.  Continue current medication regimen and adjust as needed based on labs. Obtain lipid panel today. °

## 2019-06-03 NOTE — Assessment & Plan Note (Signed)
Chronic, ongoing with BP at goal on recent visits. Recommend she obtain BP cuff and check at least 3 times a week at home.  Continue to monitor kidney function and if GFR < 45 consider d/c Farxiga or reduction Metformin.  Obtain CMP today.  Return in 3 months.

## 2019-06-03 NOTE — Assessment & Plan Note (Signed)
Chronic, stable with recent labs much improve and urine ALB today 10.  CMP today and adjust medications as needed based on renal function. Continue Telmisartan for kidney protection.

## 2019-06-03 NOTE — Assessment & Plan Note (Signed)
She continues to maintain her weight loss.  Recommend continued focus on healthy diet choices and regular physical activity (30 minutes 5 days a week).

## 2019-06-03 NOTE — Progress Notes (Signed)
BP 130/80 (BP Location: Left Arm, Cuff Size: Normal)   Pulse 78   Temp 98.5 F (36.9 C) (Oral)   Ht 5' 4.1" (1.628 m)   Wt 195 lb 6.4 oz (88.6 kg)   LMP  (LMP Unknown)   SpO2 95%   BMI 33.44 kg/m    Subjective:    [name], female    DOB: Jan 14, 1956, 64 y.o.   MRN: [id number]  HPI: April Johnson is a 64 y.o. female presenting on 06/03/2019 for comprehensive medical examination. Current medical complaints include:none  She currently lives with: husband Menopausal Symptoms: no   DIABETES Taking Levemir 60 units every other night, sometimes less. Also taking Ozempic 1MG , Metformin 1000 MG BID, Farxiga 5 MG QDAY. Last A1C was 6.7% and provider had recommended continued reduction of insulin to avoid hypoglycemia.  Reiterated importance of decreasing Levemir by2units every 3 days based on morning blood sugar readings. Takes her Gabapentin twice a day for neuropathy, helps her rest.  Does endorse some nausea the first 2 days after taking Ozempic -- takes Zofran for this with benefit. Hypoglycemic episodes:no Polydipsia/polyuria:no Visual disturbance:no Chest pain:no Paresthesias:no Glucose Monitoring:yes Accucheck frequency: Daily Fasting glucose: 112 - 125 Post prandial: Evening: 200's Before meals: Taking Insulin?:yes Long acting insulin: 60 units every other day -- last visit 30 units every day Short acting insulin: Blood Pressure Monitoring:not checking Retinal Examination:Not up to Date Foot Exam:Up to Date Pneumovax:Up to Date Influenza:Up to Date Aspirin:yes  HYPERTENSION / HYPERLIPIDEMIAWITH CKD Continues on Amlodipine, ASA, Lipitor, Metoprolol, and Micardis.Last CRT 0.77 and GFR 83. Satisfied with current treatment?yes Duration of hypertension:chronic BP monitoring frequency:not checking BP range:  BP medication side  effects:no Duration of hyperlipidemia:chronic Cholesterol medication side effects:no Cholesterol supplements: none Medication compliance:good compliance Aspirin:yes Recent stressors:no Recurrent headaches:no Visual changes:no Palpitations:no Dyspnea:no Chest pain:no Lower extremity edema:no Dizzy/lightheaded:no  GERD Continues on Omeprazole 20 MG daily. GERD control status: controlled  Satisfied with current treatment? yes Heartburn frequency: none Medication side effects: no  Medication compliance: stable Previous GERD medications: none Antacid use frequency:  none Dysphagia: no Odynophagia:  no Hematemesis: no Blood in stool: no EGD: no   Depression Screen done today and results listed below:  Depression screen [facility] 2/9 06/03/2019 04/26/2018 02/13/2018 10/24/2017 07/19/2017  Decreased Interest 0 0 0 0 0  Down, Depressed, Hopeless 0 0 0 0 0  PHQ - 2 Score 0 0 0 0 0  Altered sleeping - - 3 1 1   Tired, decreased energy - - 3 1 1   Change in appetite - - 3 1 0  Feeling bad or failure about yourself  - - 0 0 0  Trouble concentrating - - 0 0 0  Moving slowly or fidgety/restless - - 0 0 0  Suicidal thoughts - - 0 0 0  PHQ-9 Score - - 9 3 2   Difficult doing work/chores - - Not difficult at all - -    The patient has a history of falls. I did complete a risk assessment for falls. A plan of care for falls was documented.   Past Medical History:  Past Medical History:  Diagnosis Date  . Acute bronchitis   . Allergy   . Arthritis   . Complication of anesthesia    difficult to wake up  . Diabetes mellitus without complication ([place])    type 2  . Diarrhea   . Esophagitis   . Excessive menstruation   . GERD (gastroesophageal reflux disease)   .  Hyperlipidemia   . Hypertension    CONTROLLED ON MEDS  . Long term current use of insulin ([place])   . Neuropathy    left leg and feet  . Shortness of breath dyspnea    upon exertion  . Wears partial dentures     upper / does not wear    Surgical History:  Past Surgical History:  Procedure Laterality Date  . CHOLECYSTECTOMY    . COLONOSCOPY WITH PROPOFOL N/A 01/12/2015   Procedure: COLONOSCOPY WITH PROPOFOL;  Surgeon: Lucilla Lame, MD;  Location: [place];  Service: Endoscopy;  Laterality: N/A;  DIABETIC-INSULIN DEPENDENT  . OPEN REDUCTION INTERNAL FIXATION (ORIF) DISTAL RADIAL FRACTURE Right 01/17/2019   Procedure: OPEN REDUCTION INTERNAL FIXATION (ORIF) DISTAL RADIAL FRACTURE;  Surgeon: Hessie Knows, MD;  Location: [facility];  Service: Orthopedics;  Laterality: Right;  . POLYPECTOMY  01/12/2015   Procedure: POLYPECTOMY;  Surgeon: Lucilla Lame, MD;  Location: [place];  Service: Endoscopy;;  . WRIST SURGERY Right    ganglion cyst    Medications:  Current Outpatient Medications on File Prior to Visit  Medication Sig  . amLODipine (NORVASC) 5 MG tablet TAKE 1 TABLET BY MOUTH EVERY DAY  . aspirin 81 MG tablet Take 81 mg by mouth daily. am  . atorvastatin (LIPITOR) 20 MG tablet TAKE 1 TABLET (20 MG TOTAL) BY MOUTH DAILY AT 6 PM. (Patient taking differently: Take 20 mg by mouth daily. )  . B-D ULTRAFINE III SHORT PEN 31G X 8 MM MISC USE AS DIRECTED  . FARXIGA 5 MG TABS tablet TAKE 1 TABLET BY MOUTH EVERY DAY  . gabapentin (NEURONTIN) 300 MG capsule TAKE 1 CAPSULE BY MOUTH TWICE A DAY (Patient taking differently: Take 300 mg by mouth 2 (two) times daily. )  . glucose blood test strip 1 each by Other route as needed for other. Use as instructed  . LEVEMIR FLEXTOUCH 100 UNIT/ML Pen INJECT 60 UNITS INTO THE SKIN AT BEDTIME. (Patient taking differently: Inject 60 Units into the skin every other day. )  . metFORMIN (GLUCOPHAGE) 500 MG tablet Take 2 tablets (1,000 mg total) by mouth 2 (two) times daily with a meal.  . metoprolol succinate (TOPROL-XL) 100 MG 24 hr tablet TAKE 1 TABLET BY MOUTH DAILY WITH OR IMMEDIATELY FOLLOWING A MEAL. (Patient taking differently: Take 100 mg by mouth  daily. )  . nystatin (MYCOSTATIN/NYSTOP) powder Apply topically 2 (two) times daily. (Patient taking differently: Apply 1 g topically 2 (two) times daily as needed (yeast). )  . omeprazole (PRILOSEC) 20 MG capsule TAKE 1 CAPSULE BY MOUTH EVERY DAY (Patient taking differently: Take 20 mg by mouth daily. )  . ondansetron (ZOFRAN) 4 MG tablet TAKE 1 TABLET BY MOUTH EVERY 8 HOURS AS NEEDED FOR NAUSEA AND VOMITING  . Semaglutide, 1 MG/DOSE, 2 MG/1.5ML SOPN Inject 1 mg into the skin once a week.  . telmisartan (MICARDIS) 80 MG tablet TAKE 1 TABLET BY MOUTH EVERY DAY   No current facility-administered medications on file prior to visit.    Allergies:  Allergies  Allergen Reactions  . Niacin And Related Other (See Comments)    Made her feel as if she was "burning"/skin redness  . Cyclobenzaprine Rash    Social History:  Social History   Socioeconomic History  . Marital status: Married    Spouse name: Not on file  . Number of children: Not on file  . Years of education: Not on file  . Highest education level: Not on  file  Occupational History  . Not on file  Tobacco Use  . Smoking status: Former Smoker    Packs/day: 0.25    Years: 10.00    Pack years: 2.50    Types: Cigarettes    Quit date: 03/08/1999    Years since quitting: 20.2  . Smokeless tobacco: Never Used  Substance and Sexual Activity  . Alcohol use: No    Alcohol/week: 0.0 standard drinks  . Drug use: No  . Sexual activity: Never  Other Topics Concern  . Not on file  Social History Narrative  . Not on file   Social Determinants of Health   Financial Resource Strain:   . Difficulty of Paying Living Expenses:   Food Insecurity:   . Worried About Charity fundraiser in the Last Year:   . Arboriculturist in the Last Year:   Transportation Needs:   . Film/video editor (Medical):   Marland Kitchen Lack of Transportation (Non-Medical):   Physical Activity:   . Days of Exercise per Week:   . Minutes of Exercise per Session:    Stress:   . Feeling of Stress :   Social Connections:   . Frequency of Communication with Friends and Family:   . Frequency of Social Gatherings with Friends and Family:   . Attends Religious Services:   . Active Member of Clubs or Organizations:   . Attends Archivist Meetings:   Marland Kitchen Marital Status:   Intimate Partner Violence:   . Fear of Current or Ex-Partner:   . Emotionally Abused:   Marland Kitchen Physically Abused:   . Sexually Abused:    Social History   Tobacco Use  Smoking Status Former Smoker  . Packs/day: 0.25  . Years: 10.00  . Pack years: 2.50  . Types: Cigarettes  . Quit date: 03/08/1999  . Years since quitting: 20.2  Smokeless Tobacco Never Used   Social History   Substance and Sexual Activity  Alcohol Use No  . Alcohol/week: 0.0 standard drinks    Family History:  Family History  Problem Relation Age of Onset  . Diabetes Sister   . Diabetes Brother   . Stroke Paternal Grandmother   . Diabetes Sister   . Diabetes Sister     Past medical history, surgical history, medications, allergies, family history and social history reviewed with patient today and changes made to appropriate areas of the chart.   Review of Systems - negative All other ROS negative except what is listed above and in the HPI.      Objective:    BP 130/80 (BP Location: Left Arm, Cuff Size: Normal)   Pulse 78   Temp 98.5 F (36.9 C) (Oral)   Ht 5' 4.1" (1.628 m)   Wt 195 lb 6.4 oz (88.6 kg)   LMP  (LMP Unknown)   SpO2 95%   BMI 33.44 kg/m   Wt Readings from Last 3 Encounters:  06/03/19 195 lb 6.4 oz (88.6 kg)  01/17/19 196 lb 7.6 oz (89.1 kg)  01/16/19 198 lb (89.8 kg)    Physical Exam Constitutional:      General: She is awake. She is not in acute distress.    Appearance: She is well-developed. She is not ill-appearing.  HENT:     Head: Normocephalic and atraumatic.     Right Ear: Hearing, tympanic membrane, ear canal and external ear normal. No drainage.     Left  Ear: Hearing, tympanic membrane, ear canal and external ear  normal. No drainage.     Nose: Nose normal.     Right Sinus: No maxillary sinus tenderness or frontal sinus tenderness.     Left Sinus: No maxillary sinus tenderness or frontal sinus tenderness.     Mouth/Throat:     Mouth: Mucous membranes are moist.     Pharynx: Oropharynx is clear. Uvula midline. No pharyngeal swelling, oropharyngeal exudate or posterior oropharyngeal erythema.  Eyes:     General: Lids are normal.        Right eye: No discharge.        Left eye: No discharge.     Extraocular Movements: Extraocular movements intact.     Conjunctiva/sclera: Conjunctivae normal.     Pupils: Pupils are equal, round, and reactive to light.     Visual Fields: Right eye visual fields normal and left eye visual fields normal.  Neck:     Thyroid: No thyromegaly.     Vascular: No carotid bruit.     Trachea: Trachea normal.  Cardiovascular:     Rate and Rhythm: Normal rate and regular rhythm.     Heart sounds: Normal heart sounds. No murmur. No gallop.   Pulmonary:     Effort: Pulmonary effort is normal. No accessory muscle usage or respiratory distress.     Breath sounds: Normal breath sounds.  Chest:     Breasts:        Right: Normal.        Left: Normal.  Abdominal:     General: Bowel sounds are normal.     Palpations: Abdomen is soft. There is no hepatomegaly or splenomegaly.     Tenderness: There is no abdominal tenderness.  Musculoskeletal:        General: Normal range of motion.     Cervical back: Normal range of motion and neck supple.     Right lower leg: No edema.     Left lower leg: No edema.  Lymphadenopathy:     Head:     Right side of head: No submental, submandibular, tonsillar, preauricular or posterior auricular adenopathy.     Left side of head: No submental, submandibular, tonsillar, preauricular or posterior auricular adenopathy.     Cervical: No cervical adenopathy.     Upper Body:     Right upper  body: No supraclavicular, axillary or pectoral adenopathy.     Left upper body: No supraclavicular, axillary or pectoral adenopathy.  Skin:    General: Skin is warm and dry.     Capillary Refill: Capillary refill takes less than 2 seconds.     Findings: No rash.  Neurological:     Mental Status: She is alert and oriented to person, place, and time.     Cranial Nerves: Cranial nerves are intact.     Gait: Gait is intact.     Deep Tendon Reflexes: Reflexes are normal and symmetric.     Reflex Scores:      Brachioradialis reflexes are 2+ on the right side and 2+ on the left side.      Patellar reflexes are 2+ on the right side and 2+ on the left side. Psychiatric:        Attention and Perception: Attention normal.        Mood and Affect: Mood normal.        Speech: Speech normal.        Behavior: Behavior normal. Behavior is cooperative.        Thought Content: Thought content normal.  Judgment: Judgment normal.     Results for orders placed or performed in visit on 06/03/19  Bayer DCA Hb A1c Waived  Result Value Ref Range   HB A1C (BAYER DCA - WAIVED) 6.5 <7.0 %  Microalbumin, Urine Waived  Result Value Ref Range   Microalb, Ur Waived 10 0 - 19 mg/L   Creatinine, Urine Waived 10 10 - 300 mg/dL   Microalb/Creat Ratio <30 <30 mg/g      Assessment & Plan:   Problem List Items Addressed This Visit      Cardiovascular and Mediastinum   Hypertension associated with diabetes ([place])    Chronic, ongoing with BP at goal on recent visits. Recommend she obtain BP cuff and check at least 3 times a week at home.  Continue to monitor kidney function and if GFR < 45 consider d/c Farxiga or reduction Metformin.  Obtain CMP today.  Return in 3 months.      Relevant Orders   Comprehensive metabolic panel   TSH   Bayer DCA Hb A1c Waived (Completed)     Digestive   Esophagitis    Chronic, stable with no symptoms with Prilosec on board.  Continue current medication regimen and adjust  as needed.  Mag level today.      Relevant Orders   Magnesium     Endocrine   Diabetes mellitus ([place])    Chronic, ongoing with last A1C 6.5%, making her at goal for over a year now.  Urine ALB 10 and A:C <30.  Continue Metformin 1000 MG BID, with goal of insulin reduction and possible discontinuation in future.  Reiterated importance of decreasing Levemir by 3 units every 3 days based on morning blood sugar readings. Continue Ozempic and Farxiga + continue Telmisartan for kidney protection.  Return in 3 months.      Relevant Orders   Comprehensive metabolic panel   CBC with Differential/Platelet   Bayer DCA Hb A1c Waived (Completed)   Microalbumin, Urine Waived (Completed)   Hyperlipidemia associated with type 2 diabetes mellitus ([facility])    Chronic, ongoing.  Continue current medication regimen and adjust as needed based on labs.  Obtain lipid panel today.      Relevant Orders   Lipid Panel w/o Chol/HDL Ratio   Bayer DCA Hb A1c Waived (Completed)   Diabetic peripheral neuropathy ([facility])    Chronic, ongoing with last A1C 6.5%, making her at goal for over a year now.  Continue Metformin 1000 MG BID, with goal of insulin reduction and possible discontinuation in future.  Reiterated importance of decreasing Levemir by 3 units every 3 days based on morning blood sugar readings. Continue Ozempic and Farxiga + Gabapentin for neuropathy.  Check B12 level due to long term Metformin use.  Return in 3 months.      Relevant Orders   Bayer DCA Hb A1c Waived (Completed)     Genitourinary   CKD (chronic kidney disease) stage 3, GFR 30-59 ml/min    Chronic, stable with recent labs much improve and urine ALB today 10.  CMP today and adjust medications as needed based on renal function. Continue Telmisartan for kidney protection.      Relevant Orders   Comprehensive metabolic panel   CBC with Differential/Platelet   Microalbumin, Urine Waived (Completed)     Other   Insulin long-term use ([facility])     Continue to recommend reduction of this at home by 3 units every 3 days, with goal to discontinue in future if A1C remains <  7.      Obesity    She continues to maintain her weight loss.  Recommend continued focus on healthy diet choices and regular physical activity (30 minutes 5 days a week).        Other Visit Diagnoses    Annual physical exam    -  Primary   Annual labs today CBC, CMP, TSH, lipid.   Relevant Orders   CBC with Differential/Platelet   TSH   B12 deficiency       Reports history of low level, check this today due to long term Metformin use and neuropathy.  Start supplement as needed.   Relevant Orders   Vitamin B12   Encounter for screening mammogram for malignant neoplasm of breast       Relevant Orders   MM 3D SCREEN BREAST BILATERAL       Follow up plan: Return in about 3 months (around 09/03/2019) for T2DM, HTN/HLD.   LABORATORY TESTING:  - Pap smear: up to date  IMMUNIZATIONS:   - Tdap: Tetanus vaccination status reviewed: last tetanus booster within 10 years. - Influenza: Up to date - Pneumovax: Up to date - Prevnar: Up to date - HPV: Not applicable - Zostavax vaccine: going to talk to insurance  SCREENING: -Mammogram: Up to date  - Colonoscopy: Up to date  - Bone Density: Not applicable  -Hearing Test: Not applicable  -Spirometry: Not applicable   PATIENT COUNSELING:   Advised to take 1 mg of folate supplement per day if capable of pregnancy.   Sexuality: Discussed sexually transmitted diseases, partner selection, use of condoms, avoidance of unintended pregnancy  and contraceptive alternatives.   Advised to avoid cigarette smoking.  I discussed with the patient that most people either abstain from alcohol or drink within safe limits (<=14/week and <=4 drinks/occasion for males, <=7/weeks and <= 3 drinks/occasion for females) and that the risk for alcohol disorders and other health effects rises proportionally with the number of drinks per  week and how often a drinker exceeds daily limits.  Discussed cessation/primary prevention of drug use and availability of treatment for abuse.   Diet: Encouraged to adjust caloric intake to maintain  or achieve ideal body weight, to reduce intake of dietary saturated fat and total fat, to limit sodium intake by avoiding high sodium foods and not adding table salt, and to maintain adequate dietary potassium and calcium preferably from fresh fruits, vegetables, and low-fat dairy products.    stressed the importance of regular exercise  Injury prevention: Discussed safety belts, safety helmets, smoke detector, smoking near bedding or upholstery.   Dental health: Discussed importance of regular tooth brushing, flossing, and dental visits.    NEXT PREVENTATIVE PHYSICAL DUE IN 1 YEAR. Return in about 3 months (around 09/03/2019) for T2DM, HTN/HLD.

## 2019-06-03 NOTE — Assessment & Plan Note (Signed)
Chronic, stable with no symptoms with Prilosec on board.  Continue current medication regimen and adjust as needed.  Mag level today. °

## 2019-06-03 NOTE — Assessment & Plan Note (Signed)
Continue to recommend reduction of this at home by 3 units every 3 days, with goal to discontinue in future if A1C remains <7. 

## 2019-06-03 NOTE — Patient Instructions (Signed)
Carbohydrate Counting for Diabetes Mellitus, Adult  Carbohydrate counting is a method of keeping track of how many carbohydrates you eat. Eating carbohydrates naturally increases the amount of sugar (glucose) in the blood. Counting how many carbohydrates you eat helps keep your blood glucose within normal limits, which helps you manage your diabetes (diabetes mellitus). It is important to know how many carbohydrates you can safely have in each meal. This is different for every person. A diet and nutrition specialist (registered dietitian) can help you make a meal plan and calculate how many carbohydrates you should have at each meal and snack. Carbohydrates are found in the following foods:  Grains, such as breads and cereals.  Dried beans and soy products.  Starchy vegetables, such as potatoes, peas, and corn.  Fruit and fruit juices.  Milk and yogurt.  Sweets and snack foods, such as cake, cookies, candy, chips, and soft drinks. How do I count carbohydrates? There are two ways to count carbohydrates in food. You can use either of the methods or a combination of both. Reading "Nutrition Facts" on packaged food The "Nutrition Facts" list is included on the labels of almost all packaged foods and beverages in the [place] It includes:  The serving size.  Information about nutrients in each serving, including the grams (g) of carbohydrate per serving. To use the "Nutrition Facts":  Decide how many servings you will have.  Multiply the number of servings by the number of carbohydrates per serving.  The resulting number is the total amount of carbohydrates that you will be having. Learning standard serving sizes of other foods When you eat carbohydrate foods that are not packaged or do not include "Nutrition Facts" on the label, you need to measure the servings in order to count the amount of carbohydrates:  Measure the foods that you will eat with a food scale or measuring cup, if  needed.  Decide how many standard-size servings you will eat.  Multiply the number of servings by 15. Most carbohydrate-rich foods have about 15 g of carbohydrates per serving. ? For example, if you eat 8 oz (170 g) of strawberries, you will have eaten 2 servings and 30 g of carbohydrates (2 servings x 15 g = 30 g).  For foods that have more than one food mixed, such as soups and casseroles, you must count the carbohydrates in each food that is included. The following list contains standard serving sizes of common carbohydrate-rich foods. Each of these servings has about 15 g of carbohydrates:   hamburger bun or  English muffin.   oz (15 mL) syrup.   oz (14 g) jelly.  1 slice of bread.  1 six-inch tortilla.  3 oz (85 g) cooked rice or pasta.  4 oz (113 g) cooked dried beans.  4 oz (113 g) starchy vegetable, such as peas, corn, or potatoes.  4 oz (113 g) hot cereal.  4 oz (113 g) mashed potatoes or  of a large baked potato.  4 oz (113 g) canned or frozen fruit.  4 oz (120 mL) fruit juice.  4-6 crackers.  6 chicken nuggets.  6 oz (170 g) unsweetened dry cereal.  6 oz (170 g) plain fat-free yogurt or yogurt sweetened with artificial sweeteners.  8 oz (240 mL) milk.  8 oz (170 g) fresh fruit or one small piece of fruit.  24 oz (680 g) popped popcorn. Example of carbohydrate counting Sample meal  3 oz (85 g) chicken breast.  6 oz (170 g)   brown rice.  4 oz (113 g) corn.  8 oz (240 mL) milk.  8 oz (170 g) strawberries with sugar-free whipped topping. Carbohydrate calculation 1. Identify the foods that contain carbohydrates: ? Rice. ? Corn. ? Milk. ? Strawberries. 2. Calculate how many servings you have of each food: ? 2 servings rice. ? 1 serving corn. ? 1 serving milk. ? 1 serving strawberries. 3. Multiply each number of servings by 15 g: ? 2 servings rice x 15 g = 30 g. ? 1 serving corn x 15 g = 15 g. ? 1 serving milk x 15 g = 15 g. ? 1  serving strawberries x 15 g = 15 g. 4. Add together all of the amounts to find the total grams of carbohydrates eaten: ? 30 g + 15 g + 15 g + 15 g = 75 g of carbohydrates total. Summary  Carbohydrate counting is a method of keeping track of how many carbohydrates you eat.  Eating carbohydrates naturally increases the amount of sugar (glucose) in the blood.  Counting how many carbohydrates you eat helps keep your blood glucose within normal limits, which helps you manage your diabetes.  A diet and nutrition specialist (registered dietitian) can help you make a meal plan and calculate how many carbohydrates you should have at each meal and snack. This information is not intended to replace advice given to you by your health care provider. Make sure you discuss any questions you have with your health care provider. Document Revised: 09/15/2016 Document Reviewed: 08/05/2015 Elsevier Patient Education  2020 [employer]vier Inc.  

## 2019-06-03 NOTE — Assessment & Plan Note (Signed)
Chronic, ongoing with last A1C 6.5%, making her at goal for over a year now.  Urine ALB 10 and A:C <30.  Continue Metformin 1000 MG BID, with goal of insulin reduction and possible discontinuation in future.  Reiterated importance of decreasing Levemir by 3 units every 3 days based on morning blood sugar readings. Continue Ozempic and Farxiga + continue Telmisartan for kidney protection.  Return in 3 months.

## 2019-06-03 NOTE — Assessment & Plan Note (Signed)
Chronic, ongoing with last A1C 6.5%, making her at goal for over a year now.  Continue Metformin 1000 MG BID, with goal of insulin reduction and possible discontinuation in future.  Reiterated importance of decreasing Levemir by 3 units every 3 days based on morning blood sugar readings. Continue Ozempic and Farxiga + Gabapentin for neuropathy.  Check B12 level due to long term Metformin use.  Return in 3 months.

## 2019-06-04 LAB — COMPREHENSIVE METABOLIC PANEL
ALT: 19 IU/L (ref 0–32)
AST: 15 IU/L (ref 0–40)
Albumin/Globulin Ratio: 2.2 (ref 1.2–2.2)
Albumin: 4.7 g/dL (ref 3.8–4.8)
Alkaline Phosphatase: 123 IU/L — ABNORMAL HIGH (ref 39–117)
BUN/Creatinine Ratio: 13 (ref 12–28)
BUN: 10 mg/dL (ref 8–27)
Bilirubin Total: 0.7 mg/dL (ref 0.0–1.2)
CO2: 22 mmol/L (ref 20–29)
Calcium: 10.1 mg/dL (ref 8.7–10.3)
Chloride: 102 mmol/L (ref 96–106)
Creatinine, Ser: 0.75 mg/dL (ref 0.57–1.00)
GFR calc Af Amer: 99 mL/min/{1.73_m2} (ref >59)
GFR calc non Af Amer: 86 mL/min/{1.73_m2} (ref >59)
Globulin, Total: 2.1 g/dL (ref 1.5–4.5)
Glucose: 124 mg/dL — ABNORMAL HIGH (ref 65–99)
Potassium: 4.5 mmol/L (ref 3.5–5.2)
Sodium: 141 mmol/L (ref 134–144)
Total Protein: 6.8 g/dL (ref 6.0–8.5)

## 2019-06-04 LAB — CBC WITH DIFFERENTIAL/PLATELET
Basophils Absolute: 0 10*3/uL (ref 0.0–0.2)
Basos: 0 %
EOS (ABSOLUTE): 0.1 10*3/uL (ref 0.0–0.4)
Eos: 1 %
Hematocrit: 43.8 % (ref 34.0–46.6)
Hemoglobin: 14.5 g/dL (ref 11.1–15.9)
Immature Grans (Abs): 0 10*3/uL (ref 0.0–0.1)
Immature Granulocytes: 0 %
Lymphocytes Absolute: 3 10*3/uL (ref 0.7–3.1)
Lymphs: 38 %
MCH: 27.2 pg (ref 26.6–33.0)
MCHC: 33.1 g/dL (ref 31.5–35.7)
MCV: 82 fL (ref 79–97)
Monocytes Absolute: 0.5 10*3/uL (ref 0.1–0.9)
Monocytes: 6 %
Neutrophils Absolute: 4.4 10*3/uL (ref 1.4–7.0)
Neutrophils: 55 %
Platelets: 345 10*3/uL (ref 150–450)
RBC: 5.33 x10E6/uL — ABNORMAL HIGH (ref 3.77–5.28)
RDW: 14.2 % (ref 11.7–15.4)
WBC: 8 10*3/uL (ref 3.4–10.8)

## 2019-06-04 LAB — LIPID PANEL W/O CHOL/HDL RATIO
Cholesterol, Total: 144 mg/dL (ref 100–199)
HDL: 48 mg/dL (ref >39)
LDL Chol Calc (NIH): 45 mg/dL (ref 0–99)
Triglycerides: 341 mg/dL — ABNORMAL HIGH (ref 0–149)
VLDL Cholesterol Cal: 51 mg/dL — ABNORMAL HIGH (ref 5–40)

## 2019-06-04 LAB — TSH: TSH: 2.11 u[IU]/mL (ref 0.450–4.500)

## 2019-06-04 LAB — MAGNESIUM: Magnesium: 2 mg/dL (ref 1.6–2.3)

## 2019-06-04 LAB — VITAMIN B12: Vitamin B-12: 471 pg/mL (ref 232–1245)

## 2019-06-04 NOTE — Progress Notes (Signed)
Please let April Johnson know her labs have returned and overall they are within normal limits or at goal.  Cholesterol levels are below goal, except triglycerides which continue to be elevated.  Kidney and liver function normal, alkaline phosphatase is slightly elevated, next visit will check another lab with this to see if this is due to gall bladder.  CBC shows no anemia.  Thyroid, B12, and Magnesium are normal.  Have a great afternoon.   If any questions let me know.

## 2019-06-11 ENCOUNTER — Other Ambulatory Visit: Payer: Self-pay | Admitting: Nurse Practitioner

## 2019-06-11 ENCOUNTER — Telehealth: Payer: Self-pay | Admitting: Nurse Practitioner

## 2019-06-11 MED ORDER — FLUCONAZOLE 150 MG PO TABS: 150.0000 mg | ORAL_TABLET | Freq: Once | ORAL | 0 refills | Status: AC

## 2019-06-11 MED ORDER — NYSTATIN 100000 UNIT/GM EX POWD: 1.0000 "application " | Freq: Two times a day (BID) | CUTANEOUS | 3 refills | Status: DC | PRN

## 2019-06-11 NOTE — Telephone Encounter (Signed)
Patient notified and verbalized understanding. 

## 2019-06-11 NOTE — Telephone Encounter (Signed)
Routing to provider  

## 2019-06-11 NOTE — Telephone Encounter (Signed)
Pt called stating that she saw PCP on 06/03/19 and PCP mentioned sending in a prescription for a powder for her yeast infection. Pt is also requesting a pill for a yeast infection. Please advise.      [id number] [place], [place] - [address]  [address] [place] [address]  Phone: [phone] Fax: [phone]  Not a 24 hour pharmacy; exact hours not known.

## 2019-06-11 NOTE — Telephone Encounter (Signed)
I have sent in scripts.  Please tell her not to take her Atorvastatin at same time as she takes Diflucan pill for yeast, to hold Atorvastatin for 24 hours after she takes Diflucan and then restart to help avoid interaction.

## 2019-06-11 NOTE — Telephone Encounter (Signed)
Per initial encounter, "Pt called stating that she saw PCP on 06/03/19 and PCP mentioned sending in a prescription for a powder for her yeast infection. Pt is also requesting a pill for a yeast infection. Please advise." Pharmacy listed below; will route to office for final disposition.  [id number] [place], [place] - [address]  [address] [place] [address]  Phone: [phone] Fax: [phone]  Not a 24 hour pharmacy; exact hours not known.

## 2019-06-16 ENCOUNTER — Other Ambulatory Visit: Payer: Self-pay | Admitting: Nurse Practitioner

## 2019-06-16 NOTE — Telephone Encounter (Signed)
Requested Prescriptions  Pending Prescriptions Disp Refills  . telmisartan (MICARDIS) 80 MG tablet [Pharmacy Med Name: TELMISARTAN 80 MG TABLET] 90 tablet 0    Sig: TAKE 1 TABLET BY MOUTH EVERY DAY     Cardiovascular:  Angiotensin Receptor Blockers Passed - 06/16/2019 12:54 AM      Passed - Cr in normal range and within 180 days    Creatinine, Ser  Date Value Ref Range Status  06/03/2019 0.75 0.57 - 1.00 mg/dL Final         Passed - K in normal range and within 180 days    Potassium  Date Value Ref Range Status  06/03/2019 4.5 3.5 - 5.2 mmol/L Final         Passed - Patient is not pregnant      Passed - Last BP in normal range    BP Readings from Last 1 Encounters:  06/03/19 130/80         Passed - Valid encounter within last 6 months    Recent Outpatient Visits          1 week ago Annual physical exam   [place], [name] T, NP   4 months ago Type 2 diabetes mellitus with stage 2 chronic kidney disease, with long-term current use of insulin ([place])   [place], [name] T, NP   7 months ago Type 2 diabetes mellitus with stage 2 chronic kidney disease, with long-term current use of insulin ([place])   [place], [place] T, NP   9 months ago [place] to [place], [place] T, NP   11 months ago Type 2 diabetes mellitus with stage 2 chronic kidney disease, with long-term current use of insulin ([place])   [place], [name], NP      Future Appointments            In 2 months Cannady, [name], NP [facility]

## 2019-06-19 ENCOUNTER — Other Ambulatory Visit: Payer: Self-pay | Admitting: Nurse Practitioner

## 2019-06-19 MED ORDER — LEVEMIR FLEXTOUCH 100 UNIT/ML ~~LOC~~ SOPN: 60.0000 [IU] | PEN_INJECTOR | Freq: Every day | SUBCUTANEOUS | 0 refills | Status: DC

## 2019-06-19 NOTE — Telephone Encounter (Signed)
Medication Refill - Medication: LEVEMIR FLEXTOUCH 100 UNIT/ML Pen [id number]    Preferred Pharmacy (with phone number or street name):  [id number] - [place], [place]  [address] [place] [address]  Phone: [phone] Fax: [phone]  Not a 24 hour pharmacy; exact hours not known.     Agent: Please be advised that RX refills may take up to 3 business days. We ask that you follow-up with your pharmacy.

## 2019-06-19 NOTE — Telephone Encounter (Signed)
Requested Prescriptions  Pending Prescriptions Disp Refills  . insulin detemir (LEVEMIR FLEXTOUCH) 100 UNIT/ML FlexPen 15 mL 0    Sig: Inject 60 Units into the skin at bedtime.     Endocrinology:  Diabetes - Insulins Passed - 06/19/2019 11:57 AM      Passed - HBA1C is between 0 and 7.9 and within 180 days    Hemoglobin A1C  Date Value Ref Range Status  10/27/2015 7.3%  Final   HB A1C (BAYER DCA - WAIVED)  Date Value Ref Range Status  06/03/2019 6.5 <7.0 % Final    Comment:                                          Diabetic Adult            <7.0                                       Healthy Adult        4.3 - 5.7                                                           (DCCT/NGSP) American Diabetes Association's Summary of Glycemic Recommendations for Adults with Diabetes: Hemoglobin A1c <7.0%. More stringent glycemic goals (A1c <6.0%) may further reduce complications at the cost of increased risk of hypoglycemia.          Passed - Valid encounter within last 6 months    Recent Outpatient Visits          2 weeks ago Annual physical exam   [place], [name] T, NP   4 months ago Type 2 diabetes mellitus with stage 2 chronic kidney disease, with long-term current use of insulin ([place])   [place], [name] T, NP   7 months ago Type 2 diabetes mellitus with stage 2 chronic kidney disease, with long-term current use of insulin ([place])   [place], [place] T, NP   9 months ago Close Exposure to [place], [name] T, NP   11 months ago Type 2 diabetes mellitus with stage 2 chronic kidney disease, with long-term current use of insulin ([place])   [place], [name], NP      Future Appointments            In 2 months Cannady, [name], NP [facility]

## 2019-07-10 ENCOUNTER — Other Ambulatory Visit: Payer: Self-pay | Admitting: Nurse Practitioner

## 2019-07-25 ENCOUNTER — Emergency Department: Payer: Commercial Managed Care - PPO

## 2019-07-25 ENCOUNTER — Other Ambulatory Visit: Payer: Self-pay

## 2019-07-25 ENCOUNTER — Emergency Department
Admission: EM | Admit: 2019-07-25 | Discharge: 2019-07-25 | Disposition: A | Discharge status: Home / Self Care | Payer: Commercial Managed Care - PPO | Attending: Emergency Medicine | Admitting: Emergency Medicine

## 2019-07-25 ENCOUNTER — Encounter: Payer: Self-pay | Admitting: *Deleted

## 2019-07-25 DIAGNOSIS — Z79899 Other long term (current) drug therapy: Secondary | ICD-10-CM | POA: Diagnosis not present

## 2019-07-25 DIAGNOSIS — Y998 Other external cause status: Secondary | ICD-10-CM | POA: Insufficient documentation

## 2019-07-25 DIAGNOSIS — S61213A Laceration without foreign body of left middle finger without damage to nail, initial encounter: Secondary | ICD-10-CM | POA: Insufficient documentation

## 2019-07-25 DIAGNOSIS — E1140 Type 2 diabetes mellitus with diabetic neuropathy, unspecified: Secondary | ICD-10-CM | POA: Diagnosis not present

## 2019-07-25 DIAGNOSIS — S93492A Sprain of other ligament of left ankle, initial encounter: Secondary | ICD-10-CM

## 2019-07-25 DIAGNOSIS — E1122 Type 2 diabetes mellitus with diabetic chronic kidney disease: Secondary | ICD-10-CM | POA: Diagnosis not present

## 2019-07-25 DIAGNOSIS — Y92017 Garden or yard in single-family (private) house as the place of occurrence of the external cause: Secondary | ICD-10-CM | POA: Diagnosis not present

## 2019-07-25 DIAGNOSIS — Z23 Encounter for immunization: Secondary | ICD-10-CM | POA: Diagnosis not present

## 2019-07-25 DIAGNOSIS — I129 Hypertensive chronic kidney disease with stage 1 through stage 4 chronic kidney disease, or unspecified chronic kidney disease: Secondary | ICD-10-CM | POA: Diagnosis not present

## 2019-07-25 DIAGNOSIS — Z7982 Long term (current) use of aspirin: Secondary | ICD-10-CM | POA: Diagnosis not present

## 2019-07-25 DIAGNOSIS — M19072 Primary osteoarthritis, left ankle and foot: Secondary | ICD-10-CM | POA: Insufficient documentation

## 2019-07-25 DIAGNOSIS — S6992XA Unspecified injury of left wrist, hand and finger(s), initial encounter: Secondary | ICD-10-CM | POA: Diagnosis present

## 2019-07-25 DIAGNOSIS — N183 Chronic kidney disease, stage 3 unspecified: Secondary | ICD-10-CM | POA: Insufficient documentation

## 2019-07-25 DIAGNOSIS — Z794 Long term (current) use of insulin: Secondary | ICD-10-CM | POA: Insufficient documentation

## 2019-07-25 DIAGNOSIS — W010XXA Fall on same level from slipping, tripping and stumbling without subsequent striking against object, initial encounter: Secondary | ICD-10-CM | POA: Diagnosis not present

## 2019-07-25 DIAGNOSIS — Y9389 Activity, other specified: Secondary | ICD-10-CM | POA: Insufficient documentation

## 2019-07-25 DIAGNOSIS — Z87891 Personal history of nicotine dependence: Secondary | ICD-10-CM | POA: Insufficient documentation

## 2019-07-25 MED ORDER — TETANUS-DIPHTH-ACELL PERTUSSIS 5-2.5-18.5 LF-MCG/0.5 IM SUSP
0.5000 mL | Freq: Once | INTRAMUSCULAR | Status: AC
  Administered 2019-07-25: 0.5 mL via INTRAMUSCULAR
  Filled 2019-07-25: qty 0.5

## 2019-07-25 MED ORDER — LIDOCAINE-EPINEPHRINE-TETRACAINE (LET) TOPICAL GEL
3.0000 mL | Freq: Once | TOPICAL | Status: AC
  Administered 2019-07-25: 3 mL via TOPICAL
  Filled 2019-07-25: qty 3

## 2019-07-25 MED ORDER — HYDROCODONE-ACETAMINOPHEN 5-325 MG PO TABS
1.0000 | ORAL_TABLET | ORAL | Status: AC
  Administered 2019-07-25: 1 via ORAL
  Filled 2019-07-25: qty 1

## 2019-07-25 MED ORDER — HYDROCODONE-ACETAMINOPHEN 5-325 MG PO TABS: 1.0000 | ORAL_TABLET | Freq: Four times a day (QID) | ORAL | 0 refills | Status: DC | PRN

## 2019-07-25 MED ORDER — CEPHALEXIN 500 MG PO CAPS: 500.0000 mg | ORAL_CAPSULE | Freq: Four times a day (QID) | ORAL | 0 refills | Status: AC

## 2019-07-25 NOTE — Discharge Instructions (Addendum)
Please rest ice and elevate the left ankle.  Use ankle stirrup splint as needed for comfort.  You may take Norco as needed for severe pain.  You may remove dressing tomorrow morning and apply Band-Aid.  You may shower and get wet but do not submerge laceration site underwater x10 days.  In 10 days follow-up with primary care provider, walk-in clinic for suture removal.  Return to the ER for any swelling warmth redness drainage or fevers.

## 2019-07-25 NOTE — ED Triage Notes (Signed)
Pt tripped over a water hose and injured her left ankle, there is visible swelling.  Pt has a lac to her left middle finger, unsure of last tetanus shot.

## 2019-07-25 NOTE — ED Provider Notes (Signed)
[facility] EMERGENCY DEPARTMENT Provider Note   CSN: [id number] Arrival date & time: 07/25/19  1811     History Chief Complaint  Patient presents with  . Fall    April Johnson is a 64 y.o. female presents to the emergency department for evaluation of left middle finger laceration left lower leg pain.  Patient states she tripped over a hose and fell, landed on her left middle finger suffering a laceration but no pain to the finger, also having swelling throughout the left ankle and pain along the proximal tib-fib region.  She denies any head injury, LOC, nausea or vomiting.  No neck or back pain.  Was ambulatory after the accident but with a limp.  HPI     Past Medical History:  Diagnosis Date  . Acute bronchitis   . Allergy   . Arthritis   . Complication of anesthesia    difficult to wake up  . Diabetes mellitus without complication ([place])    type 2  . Diarrhea   . Esophagitis   . Excessive menstruation   . GERD (gastroesophageal reflux disease)   . Hyperlipidemia   . Hypertension    CONTROLLED ON MEDS  . Long term current use of insulin ([place])   . Neuropathy    left leg and feet  . Shortness of breath dyspnea    upon exertion  . Wears partial dentures    upper / does not wear    Patient Active Problem List   Diagnosis Date Noted  . Obesity 06/03/2019  . CKD (chronic kidney disease) stage 3, GFR 30-59 ml/min 04/14/2017  . Eczema 04/04/2017  . Diabetic peripheral neuropathy ([place]) 09/27/2016  . Hypertension associated with diabetes ([place]) 10/27/2015  . RLS (restless legs syndrome) 07/20/2015  . Allergic rhinitis 09/16/2014  . Diabetes mellitus ([place]) 09/16/2014  . Hyperlipidemia associated with type 2 diabetes mellitus ([place]) 09/16/2014  . Insulin long-term use ([place]) 09/16/2014  . Esophagitis 09/16/2014  . Insomnia 09/16/2014    Past Surgical History:  Procedure Laterality Date  . CHOLECYSTECTOMY    . COLONOSCOPY WITH PROPOFOL N/A  01/12/2015   Procedure: COLONOSCOPY WITH PROPOFOL;  Surgeon: Lucilla Lame, MD;  Location: [facility];  Service: Endoscopy;  Laterality: N/A;  DIABETIC-INSULIN DEPENDENT  . OPEN REDUCTION INTERNAL FIXATION (ORIF) DISTAL RADIAL FRACTURE Right 01/17/2019   Procedure: OPEN REDUCTION INTERNAL FIXATION (ORIF) DISTAL RADIAL FRACTURE;  Surgeon: Hessie Knows, MD;  Location: [facility];  Service: Orthopedics;  Laterality: Right;  . POLYPECTOMY  01/12/2015   Procedure: POLYPECTOMY;  Surgeon: Lucilla Lame, MD;  Location: [facility];  Service: Endoscopy;;  . WRIST SURGERY Right    ganglion cyst     OB History   No obstetric history on file.     Family History  Problem Relation Age of Onset  . Diabetes Sister   . Diabetes Brother   . Stroke Paternal Grandmother   . Diabetes Sister   . Diabetes Sister     Social History   Tobacco Use  . Smoking status: Former Smoker    Packs/day: 0.25    Years: 10.00    Pack years: 2.50    Types: Cigarettes    Quit date: 03/08/1999    Years since quitting: 20.3  . Smokeless tobacco: Never Used  Substance Use Topics  . Alcohol use: No    Alcohol/week: 0.0 standard drinks  . Drug use: No    Home Medications Prior to Admission medications   Medication Sig  Start Date End Date Taking? Authorizing Provider  amLODipine (NORVASC) 5 MG tablet TAKE 1 TABLET BY MOUTH EVERY DAY 12/25/18   Cannady, Henrine Screws T, NP  aspirin 81 MG tablet Take 81 mg by mouth daily. am    [provider]  atorvastatin (LIPITOR) 20 MG tablet TAKE 1 TABLET (20 MG TOTAL) BY MOUTH DAILY AT 6 PM. Patient taking differently: Take 20 mg by mouth daily.  12/25/18   Cannady, Henrine Screws T, NP  B-D ULTRAFINE III SHORT PEN 31G X 8 MM MISC USE AS DIRECTED 02/23/15   Guadalupe Maple, MD  cephALEXin (KEFLEX) 500 MG capsule Take 1 capsule (500 mg total) by mouth 4 (four) times daily for 7 days. 07/25/19 08/01/19  Duanne Guess, PA-C  FARXIGA 5 MG TABS tablet TAKE 1 TABLET BY MOUTH  EVERY DAY 02/27/19   Johnson, Megan P, DO  gabapentin (NEURONTIN) 300 MG capsule TAKE 1 CAPSULE BY MOUTH TWICE A DAY Patient taking differently: Take 300 mg by mouth 2 (two) times daily.  12/25/18   Cannady, Henrine Screws T, NP  glucose blood test strip 1 each by Other route as needed for other. Use as instructed    [provider]  HYDROcodone-acetaminophen (NORCO) 5-325 MG tablet Take 1 tablet by mouth every 6 (six) hours as needed for moderate pain. 07/25/19   Duanne Guess, PA-C  insulin detemir (LEVEMIR FLEXTOUCH) 100 UNIT/ML FlexPen Inject 60 Units into the skin at bedtime. 06/19/19   Cannady, Henrine Screws T, NP  metFORMIN (GLUCOPHAGE) 500 MG tablet Take 2 tablets (1,000 mg total) by mouth 2 (two) times daily with a meal. 10/31/18   Cannady, [name] T, NP  metoprolol succinate (TOPROL-XL) 100 MG 24 hr tablet TAKE 1 TABLET BY MOUTH DAILY WITH OR IMMEDIATELY FOLLOWING A MEAL. Patient taking differently: Take 100 mg by mouth daily.  01/07/19   Cannady, Henrine Screws T, NP  nystatin (MYCOSTATIN/NYSTOP) powder Apply 1 application topically 2 (two) times daily as needed (yeast). 06/11/19   Cannady, Henrine Screws T, NP  omeprazole (PRILOSEC) 20 MG capsule TAKE 1 CAPSULE BY MOUTH EVERY DAY 07/10/19   Cannady, [name] T, NP  ondansetron (ZOFRAN) 4 MG tablet TAKE 1 TABLET BY MOUTH EVERY 8 HOURS AS NEEDED FOR NAUSEA AND VOMITING 02/05/19   Cannady, [name] T, NP  Semaglutide, 1 MG/DOSE, 2 MG/1.5ML SOPN Inject 1 mg into the skin once a week. 12/25/18   Cannady, Henrine Screws T, NP  telmisartan (MICARDIS) 80 MG tablet TAKE 1 TABLET BY MOUTH EVERY DAY 06/16/19   Marnee Guarneri T, NP    Allergies    Niacin and related and Cyclobenzaprine  Review of Systems   Review of Systems  Respiratory: Negative for shortness of breath.   Cardiovascular: Negative for chest pain.  Gastrointestinal: Negative for nausea and vomiting.  Musculoskeletal: Positive for arthralgias, gait problem and joint swelling. Negative for myalgias and neck pain.   Skin: Positive for wound. Negative for rash.  Neurological: Negative for dizziness and headaches.    Physical Exam Updated Vital Signs BP (!) 187/92 (BP Location: Right Arm)   Pulse 86   Temp 98.3 F (36.8 C) (Oral)   Resp 16   Wt 84.4 kg   LMP  (LMP Unknown)   SpO2 98%   BMI 31.83 kg/m   Physical Exam Constitutional:      Appearance: She is well-developed.  HENT:     Head: Normocephalic and atraumatic.  Eyes:     Conjunctiva/sclera: Conjunctivae normal.  Cardiovascular:     Rate  and Rhythm: Normal rate.  Pulmonary:     Effort: Pulmonary effort is normal. No respiratory distress.  Musculoskeletal:        General: Normal range of motion.     Cervical back: Normal range of motion.     Comments: Left middle finger with dorsal laceration along the middle phalanx with no tendon deficits noted.  No visible or palpable foreign body.  Laceration flap light.  No visualization of the tendon.  She is able to maintain full active extension.  She is able to make a full fist.  She is nontender along the bone.  No deformity noted.  Left lower leg shows full range of motion of the hip with no discomfort.  She is tender along the proximal and distal fibula with no tibial tenderness.  No patellar tenderness.  She is able to straight leg raise.  Tender along the anterior tibiotalar joint.  Neurovascular intact in left lower extremity.  Skin:    General: Skin is warm.     Findings: No rash.  Neurological:     General: No focal deficit present.     Mental Status: She is alert and oriented to person, place, and time. Mental status is at baseline.  Psychiatric:        Behavior: Behavior normal.        Thought Content: Thought content normal.     ED Results / Procedures / Treatments   Labs (all labs ordered are listed, but only abnormal results are displayed) Labs Reviewed - No data to display  EKG None  Radiology DG Tibia/Fibula Left  Result Date: 07/25/2019 CLINICAL DATA:  A  status post fall. EXAM: LEFT TIBIA AND FIBULA - 2 VIEW COMPARISON:  None. FINDINGS: Acute nondisplaced fracture is seen involving the proximal shaft of the left fibula. There is no evidence of dislocation. Soft tissues are unremarkable. IMPRESSION: Negative. Electronically Signed   By: Virgina Norfolk M.D.   On: 07/25/2019 21:35   DG Ankle Complete Left  Result Date: 07/25/2019 CLINICAL DATA:  Fall with ankle pain EXAM: LEFT ANKLE COMPLETE - 3+ VIEW COMPARISON:  Ankle radiographs dated 10/08/2008 FINDINGS: There is no evidence of fracture, dislocation, or joint effusion. Severe degenerative changes are seen in the ankle including flattening of the talar dome which appears chronic. Plantar and posterior calcaneal enthesophytes are noted. Soft tissue swelling surrounds the ankle. IMPRESSION: No acute osseous injury. Electronically Signed   By: Zerita Boers M.D.   On: 07/25/2019 19:19    Procedures .Marland KitchenLaceration Repair  Date/Time: 07/25/2019 10:12 PM Performed by: Duanne Guess, PA-C Authorized by: Duanne Guess, PA-C   Consent:    Consent obtained:  Verbal   Consent given by:  Patient   Risks discussed:  Pain Anesthesia (see MAR for exact dosages):    Anesthesia method:  Topical application   Topical anesthetic:  LET Laceration details:    Location:  Finger   Finger location:  L long finger   Length (cm):  3   Depth (mm):  2 Repair type:    Repair type:  Simple Pre-procedure details:    Preparation:  Patient was prepped and draped in usual sterile fashion Exploration:    Hemostasis achieved with:  LET   Wound exploration: wound explored through full range of motion and entire depth of wound probed and visualized     Wound extent: no foreign bodies/material noted and no tendon damage noted     Contaminated: no   Treatment:  Area cleansed with:  Betadine and saline   Amount of cleaning:  Standard   Irrigation method:  Pressure wash   Visualized foreign bodies/material  removed: no   Skin repair:    Repair method:  Sutures   Suture size:  5-0   Suture technique:  Simple interrupted   Number of sutures:  3 Approximation:    Approximation:  Close Post-procedure details:    Dressing:  Bulky dressing   Patient tolerance of procedure:  Tolerated well, no immediate complications   (including critical care time)  Medications Ordered in ED Medications  lidocaine-EPINEPHrine-tetracaine (LET) topical gel (3 mLs Topical Given 07/25/19 2118)  Tdap (BOOSTRIX) injection 0.5 mL (0.5 mLs Intramuscular Given 07/25/19 2118)  HYDROcodone-acetaminophen (NORCO/VICODIN) 5-325 MG per tablet 1 tablet (1 tablet Oral Given 07/25/19 2118)    ED Course  I have reviewed the triage vital signs and the nursing notes.  Pertinent labs & imaging results that were available during my care of the patient were reviewed by me and considered in my medical decision making (see chart for details).    MDM Rules/Calculators/A&P                      64 year old female with fall earlier today.  No head injury, LOC.  Laceration to the dorsal aspect of the left middle finger with no tendon deficit noted.  Laceration repaired.  Tetanus updated.  X-rays of the left lower leg show no fracture.  She does appear to have evidence of ankle sprain.  She is placed into ankle stirrup splint.  She is educated on signs symptoms return to the ED for. Final Clinical Impression(s) / ED Diagnoses Final diagnoses:  Laceration of left middle finger without foreign body without damage to nail, initial encounter  Sprain of anterior talofibular ligament of left ankle, initial encounter  Primary osteoarthritis of left ankle    Rx / DC Orders ED Discharge Orders         Ordered    cephALEXin (KEFLEX) 500 MG capsule  4 times daily     07/25/19 2210    HYDROcodone-acetaminophen (NORCO) 5-325 MG tablet  Every 6 hours PRN     07/25/19 2210           Renata Caprice 07/25/19 2213    Blake Divine, MD 07/26/19 0000

## 2019-08-08 ENCOUNTER — Other Ambulatory Visit: Payer: Self-pay | Admitting: Nurse Practitioner

## 2019-08-21 ENCOUNTER — Other Ambulatory Visit: Payer: Self-pay

## 2019-08-21 MED ORDER — DAPAGLIFLOZIN PROPANEDIOL 5 MG PO TABS: 5.0000 mg | ORAL_TABLET | Freq: Every day | ORAL | 4 refills | Status: DC

## 2019-08-21 NOTE — Telephone Encounter (Signed)
Patient last seen 06/03/19 and has appointment 09/03/19

## 2019-09-03 ENCOUNTER — Ambulatory Visit: Payer: Commercial Managed Care - PPO | Admitting: Nurse Practitioner

## 2019-09-03 ENCOUNTER — Other Ambulatory Visit: Payer: Self-pay

## 2019-09-03 ENCOUNTER — Encounter: Payer: Self-pay | Admitting: Nurse Practitioner

## 2019-09-03 VITALS — BP 128/78 | HR 73 | Temp 97.8°F | Wt 186.0 lb

## 2019-09-03 DIAGNOSIS — Z6831 Body mass index (BMI) 31.0-31.9, adult: Secondary | ICD-10-CM

## 2019-09-03 DIAGNOSIS — Z794 Long term (current) use of insulin: Secondary | ICD-10-CM | POA: Diagnosis not present

## 2019-09-03 DIAGNOSIS — E1142 Type 2 diabetes mellitus with diabetic polyneuropathy: Secondary | ICD-10-CM | POA: Diagnosis not present

## 2019-09-03 DIAGNOSIS — E785 Hyperlipidemia, unspecified: Secondary | ICD-10-CM

## 2019-09-03 DIAGNOSIS — E1169 Type 2 diabetes mellitus with other specified complication: Secondary | ICD-10-CM

## 2019-09-03 DIAGNOSIS — E1159 Type 2 diabetes mellitus with other circulatory complications: Secondary | ICD-10-CM | POA: Diagnosis not present

## 2019-09-03 DIAGNOSIS — I152 Hypertension secondary to endocrine disorders: Secondary | ICD-10-CM

## 2019-09-03 DIAGNOSIS — E6609 Other obesity due to excess calories: Secondary | ICD-10-CM

## 2019-09-03 DIAGNOSIS — E119 Type 2 diabetes mellitus without complications: Secondary | ICD-10-CM

## 2019-09-03 DIAGNOSIS — E669 Obesity, unspecified: Secondary | ICD-10-CM

## 2019-09-03 DIAGNOSIS — I10 Essential (primary) hypertension: Secondary | ICD-10-CM

## 2019-09-03 LAB — BAYER DCA HB A1C WAIVED: HB A1C (BAYER DCA - WAIVED): 6.2 % (ref <7.0)

## 2019-09-03 MED ORDER — GLUCOSE BLOOD VI STRP: ORAL_STRIP | 12 refills | Status: AC

## 2019-09-03 MED ORDER — ONETOUCH ULTRASOFT LANCETS MISC
12 refills | Status: DC
Start: 2019-09-03 — End: 2021-04-20

## 2019-09-03 MED ORDER — ONETOUCH VERIO W/DEVICE KIT: PACK | 1 refills | Status: DC

## 2019-09-03 NOTE — Assessment & Plan Note (Addendum)
Chronic, ongoing with BP at goal on recent visits. Recommend she check BP at least 3 times a week at home + focus on DASH diet.  Continue to monitor kidney function and if GFR < 45 consider d/c Farxiga or reduction Metformin.  Obtain BMP next visit.  Return in 3 months.

## 2019-09-03 NOTE — Assessment & Plan Note (Signed)
Recommended eating smaller high protein, low fat meals more frequently and exercising 30 mins a day 5 times a week with a goal of 10-15lb weight loss in the next 3 months. Patient voiced their understanding and motivation to adhere to these recommendations.  

## 2019-09-03 NOTE — Assessment & Plan Note (Addendum)
Continue to recommend reduction of this at home by 3 units every 3 days, with goal to discontinue in future if A1C remains <7. 

## 2019-09-03 NOTE — Assessment & Plan Note (Signed)
Chronic, ongoing.  Continue current medication regimen and adjust as needed based on labs. Obtain lipid panel next visit. 

## 2019-09-03 NOTE — Assessment & Plan Note (Signed)
Chronic, ongoing with last A1C 6.2%, making her at goal for over a year now.  Urine ALB 10 and A:C <30 last visit.  Continue Metformin 1000 MG BID, with goal of insulin reduction and possible discontinuation in future.  Reiterated importance of decreasing Levemir by 3 units every 3 days based on morning blood sugar readings. Continue Ozempic and Farxiga + continue Telmisartan for kidney protection.  Return in 3 months.

## 2019-09-03 NOTE — Patient Instructions (Signed)

## 2019-09-03 NOTE — Assessment & Plan Note (Signed)
Chronic, ongoing with last A1C 6.2%, making her at goal for over a year now.  Continue Metformin 1000 MG BID, with goal of insulin reduction and possible discontinuation in future.  Reiterated importance of decreasing Levemir by 3 units every 3 days based on morning blood sugar readings. Continue Ozempic and Farxiga + Gabapentin for neuropathy.  B12 level checked and >300 last visit.  Return in 3 months.

## 2019-09-03 NOTE — Progress Notes (Signed)
BP 128/78   Pulse 73   Temp 97.8 F (36.6 C) (Oral)   Wt 186 lb (84.4 kg)   LMP  (LMP Unknown)   SpO2 97%   BMI 31.83 kg/m    Subjective:    [name], female    DOB: 1955/07/26, 64 y.o.   MRN: [id number]  HPI: April Johnson is a 64 y.o. female  Chief Complaint  Patient presents with  . Diabetes  . Hypertension  . Hyperlipidemia   DIABETES Taking Levemir 30 units every other night, sometimes less. Also taking Ozempic 1MG , Metformin 1000 MG BID, Farxiga 5 MG QDAY. Last A1C was 6.5% and provider had recommended continued reduction of insulin to avoid hypoglycemia.  Reiterated importance of decreasing Levemir by2units every 3 days based on morning blood sugar readings.Takes her Gabapentin twice a day for neuropathy, helps her rest.  Does endorse some nausea the first 2 days after taking Ozempic -- takes Zofran for this with benefit. Hypoglycemic episodes:no Polydipsia/polyuria:no Visual disturbance:no Chest pain:no Paresthesias:no Glucose Monitoring:yes Accucheck frequency: Daily Fasting glucose: 115-130, occasional 140 Post prandial: Evening: 160 to 200 Before meals: Taking Insulin?:yes Long acting insulin:30units every other day Short acting insulin: Blood Pressure Monitoring:not checking Retinal Examination:Not up to Date Foot Exam:Up to Date Pneumovax:Up to Date Influenza:Up to Date Aspirin:yes  HYPERTENSION / HYPERLIPIDEMIAWITH CKD Continues on Amlodipine, ASA, Lipitor, Metoprolol, and Micardis.Last CRT 0.75 and GFR 86, LDL 45, TRIGS 341. Satisfied with current treatment?yes Duration of hypertension:chronic BP monitoring frequency:checking a few times a week BP range: 130/80's range BP medication side effects:no Duration of hyperlipidemia:chronic Cholesterol medication side effects:no Cholesterol supplements:  none Medication compliance:good compliance Aspirin:yes Recent stressors:no Recurrent headaches:no Visual changes:no Palpitations:no Dyspnea:no Chest pain:no Lower extremity edema:no Dizzy/lightheaded:no  Relevant past medical, surgical, family and social history reviewed and updated as indicated. Interim medical history since our last visit reviewed. Allergies and medications reviewed and updated.  Review of Systems  Constitutional: Negative for activity change, appetite change, diaphoresis, fatigue and fever.  Respiratory: Negative for cough, chest tightness and shortness of breath.   Cardiovascular: Negative for chest pain, palpitations and leg swelling.  Gastrointestinal: Negative.   Endocrine: Negative for polydipsia, polyphagia and polyuria.  Neurological: Negative.   Psychiatric/Behavioral: Negative.     Per HPI unless specifically indicated above     Objective:    BP 128/78   Pulse 73   Temp 97.8 F (36.6 C) (Oral)   Wt 186 lb (84.4 kg)   LMP  (LMP Unknown)   SpO2 97%   BMI 31.83 kg/m   Wt Readings from Last 3 Encounters:  09/03/19 186 lb (84.4 kg)  07/25/19 186 lb (84.4 kg)  06/03/19 195 lb 6.4 oz (88.6 kg)    Physical Exam Vitals and nursing note reviewed.  Constitutional:      General: She is awake. She is not in acute distress.    Appearance: She is well-developed and overweight. She is not ill-appearing.  HENT:     Head: Normocephalic.     Right Ear: Hearing normal.     Left Ear: Hearing normal.  Eyes:     General: Lids are normal.        Right eye: No discharge.        Left eye: No discharge.     Conjunctiva/sclera: Conjunctivae normal.     Pupils: Pupils are equal, round, and reactive to light.  Neck:     Thyroid: No thyromegaly.     Vascular: No  carotid bruit.  Cardiovascular:     Rate and Rhythm: Normal rate and regular rhythm.     Heart sounds: Normal heart sounds. No murmur heard.  No gallop.   Pulmonary:     Effort:  Pulmonary effort is normal.     Breath sounds: Normal breath sounds.  Abdominal:     General: Bowel sounds are normal.     Palpations: Abdomen is soft.  Musculoskeletal:     Cervical back: Normal range of motion and neck supple.     Right lower leg: No edema.     Left lower leg: No edema.  Skin:    General: Skin is warm and dry.  Neurological:     Mental Status: She is alert and oriented to person, place, and time.  Psychiatric:        Attention and Perception: Attention normal.        Mood and Affect: Mood normal.        Speech: Speech normal.        Behavior: Behavior normal. Behavior is cooperative.        Thought Content: Thought content normal.    Results for orders placed or performed in visit on 06/03/19  HM DIABETES EYE EXAM  Result Value Ref Range   HM Diabetic Eye Exam No Retinopathy No Retinopathy      Assessment & Plan:   Problem List Items Addressed This Visit      Cardiovascular and Mediastinum   Hypertension associated with diabetes ([place])    Chronic, ongoing with BP at goal on recent visits. Recommend she check BP at least 3 times a week at home + focus on DASH diet.  Continue to monitor kidney function and if GFR < 45 consider d/c Farxiga or reduction Metformin.  Obtain BMP next visit.  Return in 3 months.        Endocrine   Type 2 diabetes mellitus with obesity (HCC) - Primary    Chronic, ongoing with last A1C 6.2%, making her at goal for over a year now.  Urine ALB 10 and A:C <30 last visit.  Continue Metformin 1000 MG BID, with goal of insulin reduction and possible discontinuation in future.  Reiterated importance of decreasing Levemir by 3 units every 3 days based on morning blood sugar readings. Continue Ozempic and Farxiga + continue Telmisartan for kidney protection.  Return in 3 months.      Hyperlipidemia associated with type 2 diabetes mellitus (HCC)    Chronic, ongoing.  Continue current medication regimen and adjust as needed based on labs.   Obtain lipid panel next visit.      Diabetic peripheral neuropathy (HCC)    Chronic, ongoing with last A1C 6.2%, making her at goal for over a year now.  Continue Metformin 1000 MG BID, with goal of insulin reduction and possible discontinuation in future.  Reiterated importance of decreasing Levemir by 3 units every 3 days based on morning blood sugar readings. Continue Ozempic and Farxiga + Gabapentin for neuropathy.  B12 level checked and >300 last visit.  Return in 3 months.      Relevant Orders   Bayer DCA Hb A1c Waived     Other   Insulin long-term use ([place])    Continue to recommend reduction of this at home by 3 units every 3 days, with goal to discontinue in future if A1C remains <7.      Obesity    Recommended eating smaller high protein, low fat meals more frequently  and exercising 30 mins a day 5 times a week with a goal of 10-15lb weight loss in the next 3 months. Patient voiced their understanding and motivation to adhere to these recommendations.           Follow up plan: Return in about 3 months (around 12/04/2019) for T2DM, HTN/HLD, GERD.

## 2019-09-24 ENCOUNTER — Other Ambulatory Visit: Payer: Self-pay | Admitting: Nurse Practitioner

## 2019-09-24 NOTE — Telephone Encounter (Signed)
Requested Prescriptions  Pending Prescriptions Disp Refills  . LEVEMIR FLEXTOUCH 100 UNIT/ML FlexPen [Pharmacy Med Name: LEVEMIR FLEXTOUCH 100 UNIT/ML] 15 mL 1    Sig: INJECT 60 UNITS INTO THE SKIN AT BEDTIME.     Endocrinology:  Diabetes - Insulins Passed - 09/24/2019  2:47 PM      Passed - HBA1C is between 0 and 7.9 and within 180 days    Hemoglobin A1C  Date Value Ref Range Status  10/27/2015 7.3%  Final   HB A1C (BAYER DCA - WAIVED)  Date Value Ref Range Status  09/03/2019 6.2 <7.0 % Final    Comment:                                          Diabetic Adult            <7.0                                       Healthy Adult        4.3 - 5.7                                                           (DCCT/NGSP) American Diabetes Association's Summary of Glycemic Recommendations for Adults with Diabetes: Hemoglobin A1c <7.0%. More stringent glycemic goals (A1c <6.0%) may further reduce complications at the cost of increased risk of hypoglycemia.          Passed - Valid encounter within last 6 months    Recent Outpatient Visits          3 weeks ago Type 2 diabetes mellitus with obesity ([place])   [place], [place] T, NP   3 months ago Annual physical exam   [place], [name] T, NP   7 months ago Type 2 diabetes mellitus with stage 2 chronic kidney disease, with long-term current use of insulin ([place])   [place], [name] T, NP   10 months ago Type 2 diabetes mellitus with stage 2 chronic kidney disease, with long-term current use of insulin ([place])   [place], [place] T, NP   1 year ago [place], [name], NP      Future Appointments            In 2 months Cannady, [name], NP [facility]

## 2019-10-21 ENCOUNTER — Other Ambulatory Visit: Payer: Self-pay | Admitting: Nurse Practitioner

## 2019-10-21 NOTE — Telephone Encounter (Signed)
Requested medication (s) are due for refill today - yes  Requested medication (s) are on the active medication list -yes  Future visit scheduled -yes  Last refill: 09/04/19  Notes to clinic: Request for non delegated Rx  Requested Prescriptions  Pending Prescriptions Disp Refills   ondansetron (ZOFRAN) 4 MG tablet [Pharmacy Med Name: ONDANSETRON HCL 4 MG TABLET] 90 tablet 1    Sig: TAKE 1 TABLET BY MOUTH EVERY 8 HOURS AS NEEDED FOR NAUSEA AND VOMITING      Not Delegated - Gastroenterology: Antiemetics Failed - 10/21/2019  1:21 PM      Failed - This refill cannot be delegated      Passed - Valid encounter within last 6 months    Recent Outpatient Visits           1 month ago Type 2 diabetes mellitus with obesity ([place])   [place], [place] T, NP   4 months ago Annual physical exam   [place], [name] T, NP   8 months ago Type 2 diabetes mellitus with stage 2 chronic kidney disease, with long-term current use of insulin ([place])   [place], [name] T, NP   11 months ago Type 2 diabetes mellitus with stage 2 chronic kidney disease, with long-term current use of insulin ([place])   [place], [place] T, NP   1 year ago [place], [name], NP       Future Appointments             In 1 month Cannady, [name], NP [facility]                Requested Prescriptions  Pending Prescriptions Disp Refills   ondansetron (ZOFRAN) 4 MG tablet [Pharmacy Med Name: ONDANSETRON HCL 4 MG TABLET] 90 tablet 1    Sig: TAKE 1 TABLET BY MOUTH EVERY 8 HOURS AS NEEDED FOR NAUSEA AND VOMITING      Not Delegated - Gastroenterology: Antiemetics Failed - 10/21/2019  1:21 PM      Failed - This refill cannot be delegated      Passed - Valid encounter within last 6 months    Recent Outpatient Visits           1 month ago Type 2 diabetes  mellitus with obesity ([place])   [place], [place] T, NP   4 months ago Annual physical exam   [place], [name] T, NP   8 months ago Type 2 diabetes mellitus with stage 2 chronic kidney disease, with long-term current use of insulin ([place])   [place]ady, [name] T, NP   11 months ago Type 2 diabetes mellitus with stage 2 chronic kidney disease, with long-term current use of insulin ([place])   [place], [place] T, NP   1 year ago [place], [name], NP       Future Appointments             In 1 month Cannady, [name], NP [facility]

## 2019-11-02 ENCOUNTER — Other Ambulatory Visit: Payer: Self-pay | Admitting: Nurse Practitioner

## 2019-11-27 ENCOUNTER — Telehealth: Payer: Self-pay | Admitting: Nurse Practitioner

## 2019-11-27 MED ORDER — SCOPOLAMINE 1 MG/3DAYS TD PT72: 1.0000 | MEDICATED_PATCH | TRANSDERMAL | 12 refills | Status: DC

## 2019-11-27 NOTE — Telephone Encounter (Signed)
Pt is wanting to have [name] send in rx for some patches since she sent in some for her husband. Routing to provider  Copied from [place] [phone]. Topic: General - Other >> Nov 27, 2019 10:56 AM Yvette Rack wrote: Reason for CRM: Pt requests that a patch for motion sickness be sent to her pharmacy.

## 2019-12-04 ENCOUNTER — Ambulatory Visit: Payer: Commercial Managed Care - PPO | Admitting: Nurse Practitioner

## 2019-12-11 ENCOUNTER — Other Ambulatory Visit: Payer: Self-pay | Admitting: Nurse Practitioner

## 2019-12-25 ENCOUNTER — Other Ambulatory Visit: Payer: Self-pay | Admitting: Nurse Practitioner

## 2019-12-25 NOTE — Telephone Encounter (Signed)
Requested Prescriptions  Pending Prescriptions Disp Refills  . omeprazole (PRILOSEC) 20 MG capsule [Pharmacy Med Name: OMEPRAZOLE DR 20 MG CAPSULE] 90 capsule 1    Sig: TAKE 1 CAPSULE BY MOUTH EVERY DAY     Gastroenterology: Proton Pump Inhibitors Passed - 12/25/2019  1:12 AM      Passed - Valid encounter within last 12 months    Recent Outpatient Visits          3 months ago Type 2 diabetes mellitus with obesity ([place])   [place], [place] T, NP   6 months ago Annual physical exam   [place], [name] T, NP   10 months ago Type 2 diabetes mellitus with stage 2 chronic kidney disease, with long-term current use of insulin ([place])   [place], [employer] [place] T, NP   1 year ago Type 2 diabetes mellitus with stage 2 chronic kidney disease, with long-term current use of insulin ([place])   [place], [place] T, NP   1 year ago [place], [name], NP      Future Appointments            In 6 days Cannady, [name], NP [facility]

## 2019-12-31 ENCOUNTER — Ambulatory Visit: Payer: Commercial Managed Care - PPO | Admitting: Nurse Practitioner

## 2019-12-31 ENCOUNTER — Encounter: Payer: Self-pay | Admitting: Nurse Practitioner

## 2019-12-31 ENCOUNTER — Other Ambulatory Visit: Payer: Self-pay

## 2019-12-31 VITALS — BP 139/84 | HR 70 | Temp 98.4°F | Resp 16 | Wt 184.2 lb

## 2019-12-31 DIAGNOSIS — E1159 Type 2 diabetes mellitus with other circulatory complications: Secondary | ICD-10-CM

## 2019-12-31 DIAGNOSIS — E1142 Type 2 diabetes mellitus with diabetic polyneuropathy: Secondary | ICD-10-CM

## 2019-12-31 DIAGNOSIS — E1169 Type 2 diabetes mellitus with other specified complication: Secondary | ICD-10-CM | POA: Diagnosis not present

## 2019-12-31 DIAGNOSIS — E6609 Other obesity due to excess calories: Secondary | ICD-10-CM

## 2019-12-31 DIAGNOSIS — Z23 Encounter for immunization: Secondary | ICD-10-CM | POA: Diagnosis not present

## 2019-12-31 DIAGNOSIS — E785 Hyperlipidemia, unspecified: Secondary | ICD-10-CM

## 2019-12-31 DIAGNOSIS — E119 Type 2 diabetes mellitus without complications: Secondary | ICD-10-CM

## 2019-12-31 DIAGNOSIS — E669 Obesity, unspecified: Secondary | ICD-10-CM

## 2019-12-31 DIAGNOSIS — Z794 Long term (current) use of insulin: Secondary | ICD-10-CM | POA: Diagnosis not present

## 2019-12-31 DIAGNOSIS — Z6831 Body mass index (BMI) 31.0-31.9, adult: Secondary | ICD-10-CM

## 2019-12-31 DIAGNOSIS — I152 Hypertension secondary to endocrine disorders: Secondary | ICD-10-CM

## 2019-12-31 LAB — BAYER DCA HB A1C WAIVED: HB A1C (BAYER DCA - WAIVED): 6.3 % (ref <7.0)

## 2019-12-31 NOTE — Assessment & Plan Note (Signed)
Continue to recommend reduction of this at home by 3 units every 3 days, with goal to discontinue in future if A1C remains <7.

## 2019-12-31 NOTE — Patient Instructions (Signed)

## 2019-12-31 NOTE — Assessment & Plan Note (Signed)
Chronic, ongoing with last A1C 6.3%, making her at goal for over a year now.  Continue Metformin 1000 MG BID + Ozempic and Farxiga, with goal of insulin reduction and possible discontinuation in future.  Reiterated importance of decreasing Levemir by 3 units every 3 days based on morning blood sugar readings. Continue Gabapentin for neuropathy.  B12 level at goal.  Return in 3 months.

## 2019-12-31 NOTE — Assessment & Plan Note (Signed)
BMI 31.52. Recommended eating smaller high protein, low fat meals more frequently and exercising 30 mins a day 5 times a week with a goal of 10-15lb weight loss in the next 3 months. Patient voiced their understanding and motivation to adhere to these recommendations.   

## 2019-12-31 NOTE — Assessment & Plan Note (Signed)
Chronic, ongoing.  Continue current medication regimen and adjust as needed based on labs. Obtain lipid panel today. °

## 2019-12-31 NOTE — Assessment & Plan Note (Signed)
Chronic, ongoing with BP at goal on recent visits and on home readings, slightly above goal today. Continue current medication regimen at this time and adjust as needed.  Telmisartan offering kidney protection.  Recommend she check BP at least 3 times a week at home + focus on DASH diet.  Continue to monitor kidney function and if GFR < 45 consider d/c Farxiga or reduction Metformin.  Obtain BMP today.  Return in 3 months.

## 2019-12-31 NOTE — Progress Notes (Signed)
BP 139/84 (BP Location: Left Arm, Patient Position: Sitting, Cuff Size: Normal)    Pulse 70    Temp 98.4 F (36.9 C) (Oral)    Resp 16    Wt 184 lb 3.2 oz (83.6 kg)    LMP  (LMP Unknown)    SpO2 97%    BMI 31.52 kg/m    Subjective:    [name], female    DOB: 22-Jul-1955, 64 y.o.   MRN: [id number]  HPI: April Johnson is a 64 y.o. female  Chief Complaint  Patient presents with   Diabetes   Hypertension   Hyperlipidemia   DIABETES Taking Levemir 30 units every other night, sometimes less. Also taking Ozempic 1MG , Metformin 1000 MG BID, Farxiga 5 MG QDAY. Last A1C was 6.2% and provider had recommended continued reduction of insulin to avoid hypoglycemia.  Reiterated importance of decreasing Levemir by2units every 3 days based on morning blood sugar readings.Takes her Gabapentin twice a day for neuropathy, helps her rest.  Does endorse some nausea the first 2 days after taking Ozempic -- takes Zofran for this with benefit. Hypoglycemic episodes:no Polydipsia/polyuria:no Visual disturbance:no Chest pain:no Paresthesias:no Glucose Monitoring:yes Accucheck frequency: Daily Fasting glucose: 126 this morning -- 120 average in morning Post prandial: Evening: 130-140 range Before meals: Taking Insulin?:yes Long acting insulin:30units every other day Short acting insulin: Blood Pressure Monitoring:occasionally Retinal Examination:Not up to Date Foot Exam:Up to Date Pneumovax:Up to Date Influenza:Up to Date Aspirin:yes  HYPERTENSION / HYPERLIPIDEMIAWITH CKD Continues on Amlodipine, ASA, Lipitor, Metoprolol, and Micardis.Last CRT 0.75 and GFR 86, LDL 45, TRIGS 341. Satisfied with current treatment?yes Duration of hypertension:chronic BP monitoring frequency:checking a few times a week BP range: 130/80's range BP medication side  effects:no Duration of hyperlipidemia:chronic Cholesterol medication side effects:no Cholesterol supplements: none Medication compliance:good compliance Aspirin:yes Recent stressors:no Recurrent headaches:no Visual changes:no Palpitations:no Dyspnea:no Chest pain:no Lower extremity edema:no Dizzy/lightheaded:no  Relevant past medical, surgical, family and social history reviewed and updated as indicated. Interim medical history since our last visit reviewed. Allergies and medications reviewed and updated.  Review of Systems  Constitutional: Negative for activity change, appetite change, diaphoresis, fatigue and fever.  Respiratory: Negative for cough, chest tightness and shortness of breath.   Cardiovascular: Negative for chest pain, palpitations and leg swelling.  Gastrointestinal: Negative.   Endocrine: Negative for polydipsia, polyphagia and polyuria.  Neurological: Negative.   Psychiatric/Behavioral: Negative.     Per HPI unless specifically indicated above     Objective:    BP 139/84 (BP Location: Left Arm, Patient Position: Sitting, Cuff Size: Normal)    Pulse 70    Temp 98.4 F (36.9 C) (Oral)    Resp 16    Wt 184 lb 3.2 oz (83.6 kg)    LMP  (LMP Unknown)    SpO2 97%    BMI 31.52 kg/m   Wt Readings from Last 3 Encounters:  12/31/19 184 lb 3.2 oz (83.6 kg)  09/03/19 186 lb (84.4 kg)  07/25/19 186 lb (84.4 kg)    Physical Exam Vitals and nursing note reviewed.  Constitutional:      General: She is awake. She is not in acute distress.    Appearance: She is well-developed and well-groomed. She is obese. She is not ill-appearing.  HENT:     Head: Normocephalic.     Right Ear: Hearing normal.     Left Ear: Hearing normal.  Eyes:     General: Lids are normal.  Right eye: No discharge.        Left eye: No discharge.     Conjunctiva/sclera: Conjunctivae normal.     Pupils: Pupils are equal, round, and reactive to light.  Neck:     Thyroid: No  thyromegaly.     Vascular: No carotid bruit.  Cardiovascular:     Rate and Rhythm: Normal rate and regular rhythm.     Heart sounds: Normal heart sounds. No murmur heard.  No gallop.   Pulmonary:     Effort: Pulmonary effort is normal.     Breath sounds: Normal breath sounds.  Abdominal:     General: Bowel sounds are normal.     Palpations: Abdomen is soft.  Musculoskeletal:     Cervical back: Normal range of motion and neck supple.     Right lower leg: No edema.     Left lower leg: No edema.  Skin:    General: Skin is warm and dry.  Neurological:     Mental Status: She is alert and oriented to person, place, and time.  Psychiatric:        Attention and Perception: Attention normal.        Mood and Affect: Mood normal.        Speech: Speech normal.        Behavior: Behavior normal. Behavior is cooperative.        Thought Content: Thought content normal.    Diabetic Foot Exam - Simple   Simple Foot Form Visual Inspection No deformities, no ulcerations, no other skin breakdown bilaterally: Yes Sensation Testing Intact to touch and monofilament testing bilaterally: Yes Pulse Check Posterior Tibialis and Dorsalis pulse intact bilaterally: Yes Comments    Results for orders placed or performed in visit on 09/03/19  Bayer DCA Hb A1c Waived  Result Value Ref Range   HB A1C (BAYER DCA - WAIVED) 6.2 <7.0 %      Assessment & Plan:   Problem List Items Addressed This Visit      Cardiovascular and Mediastinum   Hypertension associated with diabetes ([place])    Chronic, ongoing with BP at goal on recent visits and on home readings, slightly above goal today. Continue current medication regimen at this time and adjust as needed.  Telmisartan offering kidney protection.  Recommend she check BP at least 3 times a week at home + focus on DASH diet.  Continue to monitor kidney function and if GFR < 45 consider d/c Farxiga or reduction Metformin.  Obtain BMP today.  Return in 3 months.       Relevant Orders   Bayer DCA Hb A1c Waived   Basic metabolic panel     Endocrine   Type 2 diabetes mellitus with obesity ([place]) - Primary    Chronic, ongoing with last A1C 6.3%, making her at goal for over a year now.  Urine ALB 10 and A:C <30 last visit.  Continue Metformin 1000 MG BID + Farxiga and Ozempic, with goal of insulin reduction and possible discontinuation in future.  Reiterated importance of decreasing Levemir by 3 units every 3 days based on morning blood sugar readings. Continue Telmisartan for kidney protection.  Return in 3 months.      Relevant Orders   Bayer DCA Hb A1c Waived   Hyperlipidemia associated with type 2 diabetes mellitus (HCC)    Chronic, ongoing.  Continue current medication regimen and adjust as needed based on labs.  Obtain lipid panel today.      Relevant  Orders   Bayer DCA Hb A1c Waived   Lipid Panel w/o Chol/HDL Ratio   Diabetic peripheral neuropathy (HCC)    Chronic, ongoing with last A1C 6.3%, making her at goal for over a year now.  Continue Metformin 1000 MG BID + Ozempic and Farxiga, with goal of insulin reduction and possible discontinuation in future.  Reiterated importance of decreasing Levemir by 3 units every 3 days based on morning blood sugar readings. Continue Gabapentin for neuropathy.  B12 level at goal.  Return in 3 months.        Other   Insulin long-term use (HCC)    Continue to recommend reduction of this at home by 3 units every 3 days, with goal to discontinue in future if A1C remains <7.      Obesity    BMI 31.52.  Recommended eating smaller high protein, low fat meals more frequently and exercising 30 mins a day 5 times a week with a goal of 10-15lb weight loss in the next 3 months. Patient voiced their understanding and motivation to adhere to these recommendations.        Other Visit Diagnoses    Need for influenza vaccination       Flu vaccine today       Follow up plan: Return in about 3 months (around  04/01/2020) for T2DM, HTN/HLD.

## 2019-12-31 NOTE — Assessment & Plan Note (Signed)
Chronic, ongoing with last A1C 6.3%, making her at goal for over a year now.  Urine ALB 10 and A:C <30 last visit.  Continue Metformin 1000 MG BID + Farxiga and Ozempic, with goal of insulin reduction and possible discontinuation in future.  Reiterated importance of decreasing Levemir by 3 units every 3 days based on morning blood sugar readings. Continue Telmisartan for kidney protection.  Return in 3 months.

## 2020-01-01 LAB — BASIC METABOLIC PANEL
BUN/Creatinine Ratio: 22 (ref 12–28)
BUN: 15 mg/dL (ref 8–27)
CO2: 22 mmol/L (ref 20–29)
Calcium: 9.6 mg/dL (ref 8.7–10.3)
Chloride: 104 mmol/L (ref 96–106)
Creatinine, Ser: 0.67 mg/dL (ref 0.57–1.00)
GFR calc Af Amer: 108 mL/min/{1.73_m2} (ref >59)
GFR calc non Af Amer: 94 mL/min/{1.73_m2} (ref >59)
Glucose: 98 mg/dL (ref 65–99)
Potassium: 4.3 mmol/L (ref 3.5–5.2)
Sodium: 142 mmol/L (ref 134–144)

## 2020-01-01 LAB — LIPID PANEL W/O CHOL/HDL RATIO
Cholesterol, Total: 137 mg/dL (ref 100–199)
HDL: 53 mg/dL (ref >39)
LDL Chol Calc (NIH): 55 mg/dL (ref 0–99)
Triglycerides: 176 mg/dL — ABNORMAL HIGH (ref 0–149)
VLDL Cholesterol Cal: 29 mg/dL (ref 5–40)

## 2020-01-01 NOTE — Progress Notes (Signed)
Good morning, please let Barbarann know her labs have returned and overall continue to look at goal  Kidney function normal and cholesterol levels at goal.  Very proud of her success!!  Keep it up!! Keep being awesome!!  Thank you for allowing me to participate in your care. Kindest regards, Demetrion [place]

## 2020-01-11 ENCOUNTER — Other Ambulatory Visit: Payer: Self-pay | Admitting: Nurse Practitioner

## 2020-01-17 ENCOUNTER — Other Ambulatory Visit: Payer: Self-pay | Admitting: Nurse Practitioner

## 2020-01-24 ENCOUNTER — Other Ambulatory Visit: Payer: Self-pay | Admitting: Nurse Practitioner

## 2020-01-24 NOTE — Telephone Encounter (Signed)
   Notes to clinic: Please review dose of medication and review for fill Looks like chart has different dose    Requested Prescriptions  Pending Prescriptions Disp Refills   OZEMPIC, 1 MG/DOSE, 4 MG/3ML SOPN [Pharmacy Med Name: OZEMPIC 1 MG/DOSE (4 MG/3 ML)]  3    Sig: INJECT 1 MG UNDER SKIN WEEKLY      Endocrinology:  Diabetes - GLP-1 Receptor Agonists Passed - 01/24/2020  1:39 PM      Passed - HBA1C is between 0 and 7.9 and within 180 days    Hemoglobin A1C  Date Value Ref Range Status  10/27/2015 7.3%  Final   HB A1C (BAYER DCA - WAIVED)  Date Value Ref Range Status  12/31/2019 6.3 <7.0 % Final    Comment:                                          Diabetic Adult            <7.0                                       Healthy Adult        4.3 - 5.7                                                           (DCCT/NGSP) American Diabetes Association's Summary of Glycemic Recommendations for Adults with Diabetes: Hemoglobin A1c <7.0%. More stringent glycemic goals (A1c <6.0%) may further reduce complications at the cost of increased risk of hypoglycemia.           Passed - Valid encounter within last 6 months    Recent Outpatient Visits           3 weeks ago Type 2 diabetes mellitus with obesity ([place])   [place], [name] T, NP   4 months ago Type 2 diabetes mellitus with obesity ([place])   [place], [place] T, NP   7 months ago Annual physical exam   [employer], [name] T, NP   11 months ago Type 2 diabetes mellitus with stage 2 chronic kidney disease, with long-term current use of insulin ([place])   [place], Melissa T, NP   1 year ago Type 2 diabetes mellitus with stage 2 chronic kidney disease, with long-term current use of insulin ([place])   [place], [name], NP       Future Appointments             In 2 months [place], [name], NP [employer],  [facility]

## 2020-01-31 ENCOUNTER — Other Ambulatory Visit: Payer: Self-pay | Admitting: Nurse Practitioner

## 2020-02-03 ENCOUNTER — Other Ambulatory Visit: Payer: Self-pay | Admitting: Nurse Practitioner

## 2020-02-17 ENCOUNTER — Other Ambulatory Visit: Payer: Self-pay | Admitting: Nurse Practitioner

## 2020-03-03 ENCOUNTER — Other Ambulatory Visit: Payer: Self-pay | Admitting: Nurse Practitioner

## 2020-04-01 ENCOUNTER — Other Ambulatory Visit: Payer: Self-pay

## 2020-04-01 ENCOUNTER — Ambulatory Visit: Payer: Commercial Managed Care - PPO | Admitting: Nurse Practitioner

## 2020-04-01 ENCOUNTER — Encounter: Payer: Self-pay | Admitting: Nurse Practitioner

## 2020-04-01 ENCOUNTER — Other Ambulatory Visit: Payer: Self-pay | Admitting: Nurse Practitioner

## 2020-04-01 VITALS — BP 119/71 | HR 72 | Temp 98.2°F | Ht 63.5 in | Wt 175.4 lb

## 2020-04-01 DIAGNOSIS — E119 Type 2 diabetes mellitus without complications: Secondary | ICD-10-CM

## 2020-04-01 DIAGNOSIS — Z794 Long term (current) use of insulin: Secondary | ICD-10-CM | POA: Diagnosis not present

## 2020-04-01 DIAGNOSIS — Z6830 Body mass index (BMI) 30.0-30.9, adult: Secondary | ICD-10-CM

## 2020-04-01 DIAGNOSIS — E785 Hyperlipidemia, unspecified: Secondary | ICD-10-CM

## 2020-04-01 DIAGNOSIS — E1159 Type 2 diabetes mellitus with other circulatory complications: Secondary | ICD-10-CM | POA: Diagnosis not present

## 2020-04-01 DIAGNOSIS — Z1211 Encounter for screening for malignant neoplasm of colon: Secondary | ICD-10-CM

## 2020-04-01 DIAGNOSIS — E1142 Type 2 diabetes mellitus with diabetic polyneuropathy: Secondary | ICD-10-CM

## 2020-04-01 DIAGNOSIS — E669 Obesity, unspecified: Secondary | ICD-10-CM

## 2020-04-01 DIAGNOSIS — Z1231 Encounter for screening mammogram for malignant neoplasm of breast: Secondary | ICD-10-CM

## 2020-04-01 DIAGNOSIS — I152 Hypertension secondary to endocrine disorders: Secondary | ICD-10-CM

## 2020-04-01 DIAGNOSIS — E6609 Other obesity due to excess calories: Secondary | ICD-10-CM

## 2020-04-01 DIAGNOSIS — E1169 Type 2 diabetes mellitus with other specified complication: Secondary | ICD-10-CM | POA: Diagnosis not present

## 2020-04-01 DIAGNOSIS — E66811 Obesity, class 1: Secondary | ICD-10-CM

## 2020-04-01 LAB — MICROALBUMIN, URINE WAIVED
Creatinine, Urine Waived: 200 mg/dL (ref 10–300)
Microalb, Ur Waived: 30 mg/L — ABNORMAL HIGH (ref 0–19)
Microalb/Creat Ratio: <30 mg/g (ref <30)

## 2020-04-01 LAB — BAYER DCA HB A1C WAIVED: HB A1C (BAYER DCA - WAIVED): 6 % (ref <7.0)

## 2020-04-01 MED ORDER — METOPROLOL SUCCINATE ER 100 MG PO TB24: ORAL_TABLET | ORAL | 4 refills | Status: DC

## 2020-04-01 MED ORDER — ATORVASTATIN CALCIUM 20 MG PO TABS: 20.0000 mg | ORAL_TABLET | Freq: Every day | ORAL | 4 refills | Status: DC

## 2020-04-01 MED ORDER — TELMISARTAN 80 MG PO TABS: 80.0000 mg | ORAL_TABLET | Freq: Every day | ORAL | 4 refills | Status: DC

## 2020-04-01 MED ORDER — AMLODIPINE BESYLATE 5 MG PO TABS: 5.0000 mg | ORAL_TABLET | Freq: Every day | ORAL | 4 refills | Status: DC

## 2020-04-01 MED ORDER — GABAPENTIN 300 MG PO CAPS: 300.0000 mg | ORAL_CAPSULE | Freq: Two times a day (BID) | ORAL | 4 refills | Status: DC

## 2020-04-01 MED ORDER — METFORMIN HCL 500 MG PO TABS: 1000.0000 mg | ORAL_TABLET | Freq: Two times a day (BID) | ORAL | 4 refills | Status: DC

## 2020-04-01 NOTE — Assessment & Plan Note (Signed)
Chronic, ongoing with BP at goal today and on home readings. Continue current medication regimen at this time and adjust as needed.  Telmisartan offering kidney protection with her urine ALB 30.  Recommend she check BP at least 3 times a week at home + focus on DASH diet.  Continue to monitor kidney function and if GFR < 45 consider d/c Farxiga or reduction Metformin.  Obtain BMP today.  Refills sent in.  Return in 3 months.

## 2020-04-01 NOTE — Progress Notes (Signed)
BP 119/71   Pulse 72   Temp 98.2 F (36.8 C) (Oral)   Ht 5' 3.5" (1.613 m)   Wt 175 lb 6.4 oz (79.6 kg)   LMP  (LMP Unknown)   SpO2 97%   BMI 30.58 kg/m    Subjective:    [name], female    DOB: 1956-02-22, 65 y.o.   MRN: [id number]  HPI: April Johnson is a 65 y.o. female  Chief Complaint  Patient presents with  . Diabetes  . Hypertension  . Hyperlipidemia   DIABETES Taking Levemir 30 units every other night, sometimes less. Also taking Ozempic 1MG , Metformin 1000 MG BID, Farxiga 5 MG QDAY. Last A1C was 6.3% and provider continue to reduction of insulin to avoid hypoglycemia.  Reiterated importance of decreasing Levemir by2units every 3 days based on morning blood sugar readings.Takes her Gabapentin twice a day for neuropathy, helps her rest.  Does endorse some nausea the first 2 days after taking Ozempic -- takes Zofran for this with benefit.  Has lost 11 pounds since July.   Hypoglycemic episodes:no Polydipsia/polyuria:no Visual disturbance:no Chest pain:no Paresthesias:no Glucose Monitoring:yes Accucheck frequency: Daily Fasting glucose: 110 to 120 range, occasional <100 Post prandial: Evening: 130-140 range Before meals: Taking Insulin?:yes Long acting insulin:30units every other day Short acting insulin: Blood Pressure Monitoring:occasionally Retinal Examination:Not up to Date Foot Exam:Up to Date Pneumovax:Up to Date Influenza:Up to Date Aspirin:yes  HYPERTENSION / HYPERLIPIDEMIAWITH CKD Continues on Amlodipine, ASA, Lipitor, Metoprolol, and Micardis.Last CRT 0.67 and GFR 94, LDL 55, TRIGS 176. Satisfied with current treatment?yes Duration of hypertension:chronic BP monitoring frequency:checking a few times a week BP range: yesterday 127/77 BP medication side effects:no Duration of  hyperlipidemia:chronic Cholesterol medication side effects:no Cholesterol supplements: none Medication compliance:good compliance Aspirin:yes Recent stressors:no Recurrent headaches:no Visual changes:no Palpitations:no Dyspnea:no Chest pain:no Lower extremity edema:no Dizzy/lightheaded:no  Relevant past medical, surgical, family and social history reviewed and updated as indicated. Interim medical history since our last visit reviewed. Allergies and medications reviewed and updated.  Review of Systems  Constitutional: Negative for activity change, appetite change, diaphoresis, fatigue and fever.  Respiratory: Negative for cough, chest tightness and shortness of breath.   Cardiovascular: Negative for chest pain, palpitations and leg swelling.  Gastrointestinal: Negative.   Endocrine: Negative for polydipsia, polyphagia and polyuria.  Neurological: Negative.   Psychiatric/Behavioral: Negative.     Per HPI unless specifically indicated above     Objective:    BP 119/71   Pulse 72   Temp 98.2 F (36.8 C) (Oral)   Ht 5' 3.5" (1.613 m)   Wt 175 lb 6.4 oz (79.6 kg)   LMP  (LMP Unknown)   SpO2 97%   BMI 30.58 kg/m   Wt Readings from Last 3 Encounters:  04/01/20 175 lb 6.4 oz (79.6 kg)  12/31/19 184 lb 3.2 oz (83.6 kg)  09/03/19 186 lb (84.4 kg)    Physical Exam Vitals and nursing note reviewed.  Constitutional:      General: She is awake. She is not in acute distress.    Appearance: She is well-developed and well-groomed. She is obese. She is not ill-appearing.  HENT:     Head: Normocephalic.     Right Ear: Hearing normal.     Left Ear: Hearing normal.  Eyes:     General: Lids are normal.        Right eye: No discharge.        Left eye: No discharge.  Conjunctiva/sclera: Conjunctivae normal.     Pupils: Pupils are equal, round, and reactive to light.  Neck:     Thyroid: No thyromegaly.     Vascular: No carotid bruit.  Cardiovascular:     Rate  and Rhythm: Normal rate and regular rhythm.     Heart sounds: Normal heart sounds. No murmur heard. No gallop.   Pulmonary:     Effort: Pulmonary effort is normal.     Breath sounds: Normal breath sounds.  Abdominal:     General: Bowel sounds are normal.     Palpations: Abdomen is soft.  Musculoskeletal:     Cervical back: Normal range of motion and neck supple.     Right lower leg: No edema.     Left lower leg: No edema.  Skin:    General: Skin is warm and dry.  Neurological:     Mental Status: She is alert and oriented to person, place, and time.  Psychiatric:        Attention and Perception: Attention normal.        Mood and Affect: Mood normal.        Speech: Speech normal.        Behavior: Behavior normal. Behavior is cooperative.        Thought Content: Thought content normal.    Results for orders placed or performed in visit on 12/31/19  Bayer DCA Hb A1c Waived  Result Value Ref Range   HB A1C (BAYER DCA - WAIVED) 6.3 [id number] %  Basic metabolic panel  Result Value Ref Range   Glucose 98 65 - 99 mg/dL   BUN 15 8 - 27 mg/dL   Creatinine, Ser 0.67 0.57 - 1.00 mg/dL   GFR calc non Af Amer 94 >59 mL/min/1.73   GFR calc Af Amer 108 >59 mL/min/1.73   BUN/Creatinine Ratio 22 12 - 28   Sodium 142 134 - 144 mmol/L   Potassium 4.3 3.5 - 5.2 mmol/L   Chloride 104 96 - 106 mmol/L   CO2 22 20 - 29 mmol/L   Calcium 9.6 8.7 - 10.3 mg/dL  Lipid Panel w/o Chol/HDL Ratio  Result Value Ref Range   Cholesterol, Total 137 100 - 199 mg/dL   Triglycerides 176 (H) 0 - 149 mg/dL   HDL 53 >39 mg/dL   VLDL Cholesterol Cal 29 5 - 40 mg/dL   LDL Chol Calc (NIH) 55 0 - 99 mg/dL      Assessment & Plan:   Problem List Items Addressed This Visit      Cardiovascular and Mediastinum   Hypertension associated with diabetes ([place])    Chronic, ongoing with BP at goal today and on home readings. Continue current medication regimen at this time and adjust as needed.  Telmisartan offering kidney  protection with her urine ALB 30.  Recommend she check BP at least 3 times a week at home + focus on DASH diet.  Continue to monitor kidney function and if GFR < 45 consider d/c Farxiga or reduction Metformin.  Obtain BMP today.  Refills sent in.  Return in 3 months.      Relevant Medications   telmisartan (MICARDIS) 80 MG tablet   metoprolol succinate (TOPROL-XL) 100 MG 24 hr tablet   metFORMIN (GLUCOPHAGE) 500 MG tablet   atorvastatin (LIPITOR) 20 MG tablet   amLODipine (NORVASC) 5 MG tablet   Other Relevant Orders   Bayer DCA Hb A1c Waived   Basic metabolic panel   Microalbumin, Urine Waived  Endocrine   Type 2 diabetes mellitus with obesity (HCC) - Primary    Chronic, ongoing with 6%, making her at goal for over a year now.  Urine ALB 10 and A:C <30 last visit.  Continue Metformin 1000 MG BID + Farxiga and Ozempic, with goal of insulin reduction and possible discontinuation in future.  Reiterated importance of decreasing Levemir by 2 units every 3 days based on morning blood sugar readings. Continue Telmisartan for kidney protection.  Refills sent in.  Return in 3 months.      Relevant Medications   telmisartan (MICARDIS) 80 MG tablet   metFORMIN (GLUCOPHAGE) 500 MG tablet   atorvastatin (LIPITOR) 20 MG tablet   Other Relevant Orders   Bayer DCA Hb A1c Waived   Microalbumin, Urine Waived   Hyperlipidemia associated with type 2 diabetes mellitus (HCC)    Chronic, ongoing.  Continue current medication regimen and adjust as needed based on labs.  Refills sent in.  Obtain lipid panel today.      Relevant Medications   telmisartan (MICARDIS) 80 MG tablet   metFORMIN (GLUCOPHAGE) 500 MG tablet   atorvastatin (LIPITOR) 20 MG tablet   Other Relevant Orders   Bayer DCA Hb A1c Waived   Lipid Panel w/o Chol/HDL Ratio   Diabetic peripheral neuropathy (HCC)    Chronic, ongoing with A1C 6%, making her at goal for over a year now.  Continue Metformin 1000 MG BID + Ozempic and Farxiga,  with goal of insulin reduction and possible discontinuation in future.  Reiterated importance of decreasing Levemir by 2 units every 3 days based on morning blood sugar fasting readings. Continue Gabapentin for neuropathy.  B12 level at goal.  Refills sent in.  Return in 3 months.      Relevant Medications   telmisartan (MICARDIS) 80 MG tablet   metFORMIN (GLUCOPHAGE) 500 MG tablet   gabapentin (NEURONTIN) 300 MG capsule   atorvastatin (LIPITOR) 20 MG tablet     Other   Insulin long-term use (HCC)    Continue to recommend reduction of this at home by 2 units every 3 days, with goal to discontinue in future if A1C remains <7.  She is hesitant about this, but highly recommend.      Obesity    BMI 30.58.  Has continued to lose weight with Ozempic.  Recommended eating smaller high protein, low fat meals more frequently and exercising 30 mins a day 5 times a week with a goal of 10-15lb weight loss in the next 3 months. Patient voiced their understanding and motivation to adhere to these recommendations.       Relevant Medications   metFORMIN (GLUCOPHAGE) 500 MG tablet    Other Visit Diagnoses    Encounter for screening mammogram for malignant neoplasm of breast       Mammogram ordered   Relevant Orders   MM Digital Diagnostic Bilat   Colon cancer screening       GI referral placed   Relevant Orders   Ambulatory referral to Gastroenterology       Follow up plan: Return in about 3 months (around 06/30/2020) for Annual physical with diabetes check.

## 2020-04-01 NOTE — Assessment & Plan Note (Signed)
Chronic, ongoing.  Continue current medication regimen and adjust as needed based on labs.  Refills sent in.  Obtain lipid panel today.

## 2020-04-01 NOTE — Telephone Encounter (Signed)
Directions on RX are not clear. Please double check and resend to the pharmacy.

## 2020-04-01 NOTE — Assessment & Plan Note (Signed)
BMI 30.58.  Has continued to lose weight with Ozempic.  Recommended eating smaller high protein, low fat meals more frequently and exercising 30 mins a day 5 times a week with a goal of 10-15lb weight loss in the next 3 months. Patient voiced their understanding and motivation to adhere to these recommendations.

## 2020-04-01 NOTE — Assessment & Plan Note (Addendum)
Chronic, ongoing with A1C 6%, making her at goal for over a year now.  Continue Metformin 1000 MG BID + Ozempic and Farxiga, with goal of insulin reduction and possible discontinuation in future.  Reiterated importance of decreasing Levemir by 2 units every 3 days based on morning blood sugar fasting readings. Continue Gabapentin for neuropathy.  B12 level at goal.  Refills sent in.  Return in 3 months.

## 2020-04-01 NOTE — Assessment & Plan Note (Addendum)
Continue to recommend reduction of this at home by 2 units every 3 days, with goal to discontinue in future if A1C remains <7.  She is hesitant about this, but highly recommend.

## 2020-04-01 NOTE — Telephone Encounter (Signed)
Pharmacy is requesting clarification on refill sent in by provider today- sent for review

## 2020-04-01 NOTE — Patient Instructions (Signed)
Diabetes Mellitus and Nutrition, Adult When you have diabetes, or diabetes mellitus, it is very important to have healthy eating habits because your blood sugar (glucose) levels are greatly affected by what you eat and drink. Eating healthy foods in the right amounts, at about the same times every day, can help you:  Control your blood glucose.  Lower your risk of heart disease.  Improve your blood pressure.  Reach or maintain a healthy weight. What can affect my meal plan? Every person with diabetes is different, and each person has different needs for a meal plan. Your health care provider may recommend that you work with a dietitian to make a meal plan that is best for you. Your meal plan may vary depending on factors such as:  The calories you need.  The medicines you take.  Your weight.  Your blood glucose, blood pressure, and cholesterol levels.  Your activity level.  Other health conditions you have, such as heart or kidney disease. How do carbohydrates affect me? Carbohydrates, also called carbs, affect your blood glucose level more than any other type of food. Eating carbs naturally raises the amount of glucose in your blood. Carb counting is a method for keeping track of how many carbs you eat. Counting carbs is important to keep your blood glucose at a healthy level, especially if you use insulin or take certain oral diabetes medicines. It is important to know how many carbs you can safely have in each meal. This is different for every person. Your dietitian can help you calculate how many carbs you should have at each meal and for each snack. How does alcohol affect me? Alcohol can cause a sudden decrease in blood glucose (hypoglycemia), especially if you use insulin or take certain oral diabetes medicines. Hypoglycemia can be a life-threatening condition. Symptoms of hypoglycemia, such as sleepiness, dizziness, and confusion, are similar to symptoms of having too much  alcohol.  Do not drink alcohol if: ? Your health care provider tells you not to drink. ? You are pregnant, may be pregnant, or are planning to become pregnant.  If you drink alcohol: ? Do not drink on an empty stomach. ? Limit how much you use to:  0-1 drink a day for women.  0-2 drinks a day for men. ? Be aware of how much alcohol is in your drink. In the [place] one drink equals one 12 oz bottle of beer (355 mL), one 5 oz glass of wine (148 mL), or one 1 oz glass of hard liquor (44 mL). ? Keep yourself hydrated with water, diet soda, or unsweetened iced tea.  Keep in mind that regular soda, juice, and other mixers may contain a lot of sugar and must be counted as carbs. What are tips for following this plan? Reading food labels  Start by checking the serving size on the "Nutrition Facts" label of packaged foods and drinks. The amount of calories, carbs, fats, and other nutrients listed on the label is based on one serving of the item. Many items contain more than one serving per package.  Check the total grams (g) of carbs in one serving. You can calculate the number of servings of carbs in one serving by dividing the total carbs by 15. For example, if a food has 30 g of total carbs per serving, it would be equal to 2 servings of carbs.  Check the number of grams (g) of saturated fats and trans fats in one serving. Choose foods that have   a low amount or none of these fats.  Check the number of milligrams (mg) of salt (sodium) in one serving. Most people should limit total sodium intake to less than 2,300 mg per day.  Always check the nutrition information of foods labeled as "low-fat" or "nonfat." These foods may be higher in added sugar or refined carbs and should be avoided.  Talk to your dietitian to identify your daily goals for nutrients listed on the label. Shopping  Avoid buying canned, pre-made, or processed foods. These foods tend to be high in fat, sodium, and added  sugar.  Shop around the outside edge of the grocery store. This is where you will most often find fresh fruits and vegetables, bulk grains, fresh meats, and fresh dairy. Cooking  Use low-heat cooking methods, such as baking, instead of high-heat cooking methods like deep frying.  Cook using healthy oils, such as olive, canola, or sunflower oil.  Avoid cooking with butter, cream, or high-fat meats. Meal planning  Eat meals and snacks regularly, preferably at the same times every day. Avoid going long periods of time without eating.  Eat foods that are high in fiber, such as fresh fruits, vegetables, beans, and whole grains. Talk with your dietitian about how many servings of carbs you can eat at each meal.  Eat 4-6 oz (112-168 g) of lean protein each day, such as lean meat, chicken, fish, eggs, or tofu. One ounce (oz) of lean protein is equal to: ? 1 oz (28 g) of meat, chicken, or fish. ? 1 egg. ?  cup (62 g) of tofu.  Eat some foods each day that contain healthy fats, such as avocado, nuts, seeds, and fish.   What foods should I eat? Fruits Berries. Apples. Oranges. Peaches. Apricots. Plums. Grapes. Mango. Papaya. Pomegranate. Kiwi. Cherries. Vegetables Lettuce. Spinach. Leafy greens, including kale, chard, collard greens, and mustard greens. Beets. Cauliflower. Cabbage. Broccoli. Carrots. Green beans. Tomatoes. Peppers. Onions. Cucumbers. Brussels sprouts. Grains Whole grains, such as whole-wheat or whole-grain bread, crackers, tortillas, cereal, and pasta. Unsweetened oatmeal. Quinoa. Brown or wild rice. Meats and other proteins Seafood. Poultry without skin. Lean cuts of poultry and beef. Tofu. Nuts. Seeds. Dairy Low-fat or fat-free dairy products such as milk, yogurt, and cheese. The items listed above may not be a complete list of foods and beverages you can eat. Contact a dietitian for more information. What foods should I avoid? Fruits Fruits canned with  syrup. Vegetables Canned vegetables. Frozen vegetables with butter or cream sauce. Grains Refined white flour and flour products such as bread, pasta, snack foods, and cereals. Avoid all processed foods. Meats and other proteins Fatty cuts of meat. Poultry with skin. Breaded or fried meats. Processed meat. Avoid saturated fats. Dairy Full-fat yogurt, cheese, or milk. Beverages Sweetened drinks, such as soda or iced tea. The items listed above may not be a complete list of foods and beverages you should avoid. Contact a dietitian for more information. Questions to ask a health care provider  Do I need to meet with a diabetes educator?  Do I need to meet with a dietitian?  What number can I call if I have questions?  When are the best times to check my blood glucose? Where to find more information:  American Diabetes Association: diabetes.org  Academy of Nutrition and Dietetics: www.eatright.org  National Institute of Diabetes and Digestive and Kidney Diseases: www.niddk.nih.gov  Association of Diabetes Care and Education Specialists: www.diabeteseducator.org Summary  It is important to have healthy eating   habits because your blood sugar (glucose) levels are greatly affected by what you eat and drink.  A healthy meal plan will help you control your blood glucose and maintain a healthy lifestyle.  Your health care provider may recommend that you work with a dietitian to make a meal plan that is best for you.  Keep in mind that carbohydrates (carbs) and alcohol have immediate effects on your blood glucose levels. It is important to count carbs and to use alcohol carefully. This information is not intended to replace advice given to you by your health care provider. Make sure you discuss any questions you have with your health care provider. Document Revised: 01/29/2019 Document Reviewed: 01/29/2019 Elsevier Patient Education  2021 Elsevier Inc.  

## 2020-04-01 NOTE — Assessment & Plan Note (Signed)
Chronic, ongoing with 6%, making her at goal for over a year now.  Urine ALB 10 and A:C <30 last visit.  Continue Metformin 1000 MG BID + Farxiga and Ozempic, with goal of insulin reduction and possible discontinuation in future.  Reiterated importance of decreasing Levemir by 2 units every 3 days based on morning blood sugar readings. Continue Telmisartan for kidney protection.  Refills sent in.  Return in 3 months.

## 2020-04-02 LAB — BASIC METABOLIC PANEL
BUN/Creatinine Ratio: 17 (ref 12–28)
BUN: 13 mg/dL (ref 8–27)
CO2: 24 mmol/L (ref 20–29)
Calcium: 10 mg/dL (ref 8.7–10.3)
Chloride: 103 mmol/L (ref 96–106)
Creatinine, Ser: 0.77 mg/dL (ref 0.57–1.00)
GFR calc Af Amer: 95 mL/min/{1.73_m2} (ref >59)
GFR calc non Af Amer: 82 mL/min/{1.73_m2} (ref >59)
Glucose: 105 mg/dL — ABNORMAL HIGH (ref 65–99)
Potassium: 4.3 mmol/L (ref 3.5–5.2)
Sodium: 141 mmol/L (ref 134–144)

## 2020-04-02 LAB — LIPID PANEL W/O CHOL/HDL RATIO
Cholesterol, Total: 137 mg/dL (ref 100–199)
HDL: 46 mg/dL (ref >39)
LDL Chol Calc (NIH): 60 mg/dL (ref 0–99)
Triglycerides: 187 mg/dL — ABNORMAL HIGH (ref 0–149)
VLDL Cholesterol Cal: 31 mg/dL (ref 5–40)

## 2020-04-02 NOTE — Progress Notes (Signed)
Contacted via [facility] afternoon Eliya, your labs have returned.  Overall they remain stable, with exception of ongoing mild elevation in triglycerides which we will continue to monitor.  LDL, bad cholesterol, number is at goal of <70.  Great job!! Keep being awesome!!  Thank you for allowing me to participate in your care. Kindest regards, Talayah Picardi

## 2020-04-06 ENCOUNTER — Telehealth: Payer: Commercial Managed Care - PPO

## 2020-04-06 ENCOUNTER — Telehealth: Payer: Self-pay

## 2020-04-06 NOTE — Telephone Encounter (Signed)
Attempted to contact patient to complete her 10:00 am colonoscopy triage and schedule.  LVM for her to call the office back if she received this message within 15 mins after her scheduled appt time, otherwise we will need to reschedule to a later time.  Thanks,  [place], [place]

## 2020-04-07 ENCOUNTER — Telehealth (INDEPENDENT_AMBULATORY_CARE_PROVIDER_SITE_OTHER): Payer: Commercial Managed Care - PPO | Admitting: Gastroenterology

## 2020-04-07 ENCOUNTER — Other Ambulatory Visit: Payer: Self-pay

## 2020-04-07 ENCOUNTER — Encounter: Payer: Self-pay | Admitting: Gastroenterology

## 2020-04-07 DIAGNOSIS — Z86010 Personal history of colonic polyps: Secondary | ICD-10-CM

## 2020-04-07 MED ORDER — NA SULFATE-K SULFATE-MG SULF 17.5-3.13-1.6 GM/177ML PO SOLN: 1.0000 | Freq: Once | ORAL | 0 refills | Status: AC

## 2020-04-07 MED ORDER — GOLYTELY 236 G PO SOLR: 4000.0000 mL | Freq: Once | ORAL | 0 refills | Status: AC

## 2020-04-07 NOTE — Progress Notes (Signed)
Gastroenterology Pre-Procedure Review  Request Date: 04/13/20 Requesting Physician: Dr. Allen Norris  PATIENT REVIEW QUESTIONS: The patient responded to the following health history questions as indicated:    1. Are you having any GI issues? yes (nausea upon eating red sauces. weight loss associated with starting ozempic. discussed with pcp) 2. Do you have a personal history of Polyps? yes (01/22/15 colonoscopy noted colon polyps performed by Dr. Allen Norris) 3. Do you have a family history of Colon Cancer or Polyps? no 4. Diabetes Mellitus? yes (type 2) 5. Joint replacements in the past 12 months?no 6. Major health problems in the past 3 months?no 7. Any artificial heart valves, MVP, or defibrillator?no    MEDICATIONS & ALLERGIES:    Patient reports the following regarding taking any anticoagulation/antiplatelet therapy:   Plavix, Coumadin, Eliquis, Xarelto, Lovenox, Pradaxa, Brilinta, or Effient? no Aspirin? yes (81 mg daily)  Patient confirms/reports the following medications:  Current Outpatient Medications  Medication Sig Dispense Refill  . amLODipine (NORVASC) 5 MG tablet Take 1 tablet (5 mg total) by mouth daily. 90 tablet 4  . aspirin 81 MG tablet Take 81 mg by mouth daily. am    . atorvastatin (LIPITOR) 20 MG tablet Take 1 tablet (20 mg total) by mouth daily at 6 PM. 90 tablet 4  . B-D ULTRAFINE III SHORT PEN 31G X 8 MM MISC USE AS DIRECTED 100 each 12  . Blood Glucose Monitoring Suppl (ONETOUCH VERIO) w/Device KIT Use glucometer to check blood sugar up to 3 times a day 1 kit 1  . dapagliflozin propanediol (FARXIGA) 5 MG TABS tablet Take 1 tablet (5 mg total) by mouth daily. 90 tablet 4  . gabapentin (NEURONTIN) 300 MG capsule Take 1 capsule (300 mg total) by mouth 2 (two) times daily. 180 capsule 4  . glucose blood test strip Use as instructed 100 each 12  . HYDROcodone-acetaminophen (NORCO) 5-325 MG tablet Take 1 tablet by mouth every 6 (six) hours as needed for moderate pain. 10 tablet 0   . Lancets (ONETOUCH ULTRASOFT) lancets Use as instructed 100 each 12  . LEVEMIR FLEXTOUCH 100 UNIT/ML FlexPen INJECT 60 UNITS INTO THE SKIN AT BEDTIME. 15 mL 1  . metFORMIN (GLUCOPHAGE) 500 MG tablet Take 2 tablets (1,000 mg total) by mouth 2 (two) times daily with a meal. 360 tablet 4  . metoprolol succinate (TOPROL-XL) 100 MG 24 hr tablet TAKE 1 TABLET (100 MG TOTAL) BY MOUTH DAILY. TAKE WITH OR IMMEDIATELY FOLLOWING A MEAL. 90 tablet 4  . Na Sulfate-K Sulfate-Mg Sulf 17.5-3.13-1.6 GM/177ML SOLN Take 1 kit by mouth once for 1 dose. 354 mL 0  . nystatin (MYCOSTATIN/NYSTOP) powder Apply 1 application topically 2 (two) times daily as needed (yeast). 45 g 3  . omeprazole (PRILOSEC) 20 MG capsule TAKE 1 CAPSULE BY MOUTH EVERY DAY 90 capsule 2  . ondansetron (ZOFRAN) 4 MG tablet TAKE 1 TABLET BY MOUTH EVERY 8 HOURS AS NEEDED FOR NAUSEA AND VOMITING 90 tablet 1  . OZEMPIC, 1 MG/DOSE, 4 MG/3ML SOPN INJECT 1 MG UNDER SKIN WEEKLY 12 mL 4  . telmisartan (MICARDIS) 80 MG tablet Take 1 tablet (80 mg total) by mouth daily. 90 tablet 4  . polyethylene glycol (GOLYTELY) 236 g solution Take 4,000 mLs by mouth once for 1 dose. 4000 mL 0  . scopolamine (TRANSDERM-SCOP) 1 MG/3DAYS Place 1 patch (1.5 mg total) onto the skin every 3 (three) days. (Patient not taking: No sig reported) 10 patch 12  . Semaglutide, 1 MG/DOSE, 2 MG/1.5ML SOPN  Inject 1 mg into the skin once a week. (Patient not taking: Reported on 04/07/2020) 3 pen 6   No current facility-administered medications for this visit.    Patient confirms/reports the following allergies:  Allergies  Allergen Reactions  . Niacin And Related Other (See Comments)    Made her feel as if she was "burning"/skin redness  . Cyclobenzaprine Rash    Orders Placed This Encounter  Procedures  . Procedural/ Surgical Case Request: COLONOSCOPY WITH PROPOFOL    Standing Status:   Standing    Number of Occurrences:   1    Order Specific Question:   Pre-op diagnosis     Answer:   personal history of colon polyps    Order Specific Question:   CPT Code    Answer:   82099  . Ambulatory referral to Gastroenterology    Referral Priority:   Routine    Referral Type:   Consultation    Referral Reason:   Specialty Services Required    Referred to Provider:   Lucilla Lame, MD    Number of Visits Requested:   1    AUTHORIZATION INFORMATION Primary Insurance: 1D#: Group #:  Secondary Insurance: 1D#: Group #:  SCHEDULE INFORMATION: Date: 04/13/20 Time: Location:[facility]

## 2020-04-07 NOTE — Progress Notes (Signed)
Gastroenterology Pre-Procedure Review  Request Date: Monday 04/13/20 Requesting Physician: Dr. Allen Norris  PATIENT REVIEW QUESTIONS: The patient responded to the following health history questions as indicated:    1. Are you having any GI issues? yes (Nausea pt states this occurs when she eats red sauces therefore she avoids it.  Weight loss since starting Ozempic 237 lb to 171 lb.  Discussed with PCP already.) 2. Do you have a personal history of Polyps? yes (01/22/15 colon polyps noted on colonoscopy performed by Dr. Allen Norris) 3. Do you have a family history of Colon Cancer or Polyps? no 4. Diabetes Mellitus? yes (type 2) 5. Joint replacements in the past 12 months?no 6. Major health problems in the past 3 months?no 7. Any artificial heart valves, MVP, or defibrillator?no    MEDICATIONS & ALLERGIES:    Patient reports the following regarding taking any anticoagulation/antiplatelet therapy:   Plavix, Coumadin, Eliquis, Xarelto, Lovenox, Pradaxa, Brilinta, or Effient? no Aspirin? yes (81 mg daily)  Patient confirms/reports the following medications:  Current Outpatient Medications  Medication Sig Dispense Refill  . amLODipine (NORVASC) 5 MG tablet Take 1 tablet (5 mg total) by mouth daily. 90 tablet 4  . aspirin 81 MG tablet Take 81 mg by mouth daily. am    . atorvastatin (LIPITOR) 20 MG tablet Take 1 tablet (20 mg total) by mouth daily at 6 PM. 90 tablet 4  . B-D ULTRAFINE III SHORT PEN 31G X 8 MM MISC USE AS DIRECTED 100 each 12  . Blood Glucose Monitoring Suppl (ONETOUCH VERIO) w/Device KIT Use glucometer to check blood sugar up to 3 times a day 1 kit 1  . dapagliflozin propanediol (FARXIGA) 5 MG TABS tablet Take 1 tablet (5 mg total) by mouth daily. 90 tablet 4  . gabapentin (NEURONTIN) 300 MG capsule Take 1 capsule (300 mg total) by mouth 2 (two) times daily. 180 capsule 4  . glucose blood test strip Use as instructed 100 each 12  . HYDROcodone-acetaminophen (NORCO) 5-325 MG tablet Take 1  tablet by mouth every 6 (six) hours as needed for moderate pain. 10 tablet 0  . Lancets (ONETOUCH ULTRASOFT) lancets Use as instructed 100 each 12  . LEVEMIR FLEXTOUCH 100 UNIT/ML FlexPen INJECT 60 UNITS INTO THE SKIN AT BEDTIME. 15 mL 1  . metFORMIN (GLUCOPHAGE) 500 MG tablet Take 2 tablets (1,000 mg total) by mouth 2 (two) times daily with a meal. 360 tablet 4  . metoprolol succinate (TOPROL-XL) 100 MG 24 hr tablet TAKE 1 TABLET (100 MG TOTAL) BY MOUTH DAILY. TAKE WITH OR IMMEDIATELY FOLLOWING A MEAL. 90 tablet 4  . Na Sulfate-K Sulfate-Mg Sulf 17.5-3.13-1.6 GM/177ML SOLN Take 1 kit by mouth once for 1 dose. 354 mL 0  . nystatin (MYCOSTATIN/NYSTOP) powder Apply 1 application topically 2 (two) times daily as needed (yeast). 45 g 3  . omeprazole (PRILOSEC) 20 MG capsule TAKE 1 CAPSULE BY MOUTH EVERY DAY 90 capsule 2  . ondansetron (ZOFRAN) 4 MG tablet TAKE 1 TABLET BY MOUTH EVERY 8 HOURS AS NEEDED FOR NAUSEA AND VOMITING 90 tablet 1  . OZEMPIC, 1 MG/DOSE, 4 MG/3ML SOPN INJECT 1 MG UNDER SKIN WEEKLY 12 mL 4  . scopolamine (TRANSDERM-SCOP) 1 MG/3DAYS Place 1 patch (1.5 mg total) onto the skin every 3 (three) days. (Patient not taking: No sig reported) 10 patch 12  . Semaglutide, 1 MG/DOSE, 2 MG/1.5ML SOPN Inject 1 mg into the skin once a week. (Patient not taking: Reported on 04/07/2020) 3 pen 6  . telmisartan (  MICARDIS) 80 MG tablet Take 1 tablet (80 mg total) by mouth daily. 90 tablet 4   No current facility-administered medications for this visit.    Patient confirms/reports the following allergies:  Allergies  Allergen Reactions  . Niacin And Related Other (See Comments)    Made her feel as if she was "burning"/skin redness  . Cyclobenzaprine Rash    No orders of the defined types were placed in this encounter.   AUTHORIZATION INFORMATION Primary Insurance: 1D#: Group #:  Secondary Insurance: 1D#: Group #:  SCHEDULE INFORMATION: Date: Monday 04/13/20 Time: Location:[facility]

## 2020-04-09 ENCOUNTER — Telehealth: Payer: Self-pay | Admitting: Nurse Practitioner

## 2020-04-09 ENCOUNTER — Other Ambulatory Visit
Admission: RE | Admit: 2020-04-09 | Discharge: 2020-04-09 | Disposition: A | Discharge status: Home / Self Care | Payer: Commercial Managed Care - PPO | Source: Ambulatory Visit | Attending: Gastroenterology | Admitting: Gastroenterology

## 2020-04-09 ENCOUNTER — Other Ambulatory Visit: Payer: Self-pay | Admitting: Nurse Practitioner

## 2020-04-09 ENCOUNTER — Other Ambulatory Visit: Payer: Self-pay

## 2020-04-09 DIAGNOSIS — Z01812 Encounter for preprocedural laboratory examination: Secondary | ICD-10-CM | POA: Insufficient documentation

## 2020-04-09 DIAGNOSIS — Z20822 Contact with and (suspected) exposure to covid-19: Secondary | ICD-10-CM | POA: Insufficient documentation

## 2020-04-09 NOTE — Telephone Encounter (Signed)
Requested Prescriptions  Pending Prescriptions Disp Refills  . LEVEMIR FLEXTOUCH 100 UNIT/ML FlexPen [Pharmacy Med Name: LEVEMIR FLEXTOUCH 100 UNIT/ML]  1    Sig: INJECT 60 UNITS INTO THE SKIN AT BEDTIME.     Endocrinology:  Diabetes - Insulins Passed - 04/09/2020 11:41 AM      Passed - HBA1C is between 0 and 7.9 and within 180 days    Hemoglobin A1C  Date Value Ref Range Status  10/27/2015 7.3%  Final   HB A1C (BAYER DCA - WAIVED)  Date Value Ref Range Status  04/01/2020 6.0 <7.0 % Final    Comment:                                          Diabetic Adult            <7.0                                       Healthy Adult        4.3 - 5.7                                                           (DCCT/NGSP) American Diabetes Association's Summary of Glycemic Recommendations for Adults with Diabetes: Hemoglobin A1c <7.0%. More stringent glycemic goals (A1c <6.0%) may further reduce complications at the cost of increased risk of hypoglycemia.          Passed - Valid encounter within last 6 months    Recent Outpatient Visits          1 week ago Type 2 diabetes mellitus with obesity ([place])   [place], [name] T, NP   3 months ago Type 2 diabetes mellitus with obesity ([place])   [place], [name] T, NP   7 months ago Type 2 diabetes mellitus with obesity ([place])   [place], [name], NP   10 months ago Annual physical exam   [place], [name] T, NP   1 year ago Type 2 diabetes mellitus with stage 2 chronic kidney disease, with long-term current use of insulin ([place])   [place], [name], NP      Future Appointments            In 2 months Cannady, [name], NP [employer]

## 2020-04-09 NOTE — Telephone Encounter (Signed)
PT called in regarding a prescription for fluid required for her colonoscopy. PT stated insurance will not cover the fluid without a prior authorization. PT also wants to know if this is a routine or diagnostic colonoscopy as insurance covers 100% for a routine exam and only 80% for diagnostic. PT states she will have to cancel her colonoscopy if she cannot get the fluid. Please follow up with the PA or prescribe a generic if possible so her insurance will cover it.

## 2020-04-09 NOTE — Progress Notes (Signed)
Patients Golytely bowel prep was not covered by the insurance.  Received notification of need for prior authorization.  Contacted the pharmacy to see which generic prep would be covered and neither Golytely, Nulytely, or Gavilyte were covered.  I asked pharmacist to keep rx as originally ordered and I will notify patient of cash price for prep which was $23.   Patient said she is fine with paying the $23 dollars for the prep.  Had she known that's how much it was she would have gotten the first two times she went to the pharmacy.  Patient has been asked to call the office back should she have any questions.  Bowel Prep directions were provided.  Thanks,  [place], [place]

## 2020-04-10 ENCOUNTER — Telehealth: Payer: Self-pay | Admitting: Gastroenterology

## 2020-04-10 ENCOUNTER — Other Ambulatory Visit: Payer: Self-pay

## 2020-04-10 LAB — SARS CORONAVIRUS 2 (TAT 6-24 HRS): SARS Coronavirus 2: NEGATIVE

## 2020-04-10 MED ORDER — PEG 3350-KCL-NA BICARB-NACL 420 G PO SOLR: ORAL | 0 refills | Status: DC

## 2020-04-10 NOTE — Telephone Encounter (Signed)
April Johnson has been sent to [facility] [address] per pt request.

## 2020-04-10 NOTE — Telephone Encounter (Signed)
Called and discussed with patient. Advised her that she needed to contact GI to discuss as they are the ones who write this. Let her know that we do no write for the liquid for colonoscopies. Patient verbalized understanding.

## 2020-04-10 NOTE — Telephone Encounter (Signed)
Patient called and LM on VM stating that her prep is too expensive for her, can we call in something cheaper? [facility] [place]

## 2020-04-10 NOTE — Discharge Instructions (Signed)

## 2020-04-13 ENCOUNTER — Other Ambulatory Visit: Payer: Self-pay

## 2020-04-13 ENCOUNTER — Ambulatory Visit: Payer: Commercial Managed Care - PPO | Admitting: Anesthesiology

## 2020-04-13 ENCOUNTER — Ambulatory Visit
Admission: RE | Admit: 2020-04-13 | Discharge: 2020-04-13 | Disposition: A | Discharge status: Home / Self Care | Payer: Commercial Managed Care - PPO | Attending: Gastroenterology | Admitting: Gastroenterology

## 2020-04-13 ENCOUNTER — Encounter: Payer: Self-pay | Admitting: Gastroenterology

## 2020-04-13 ENCOUNTER — Encounter
Admission: RE | Disposition: A | Discharge status: Home / Self Care | Payer: Self-pay | Source: Home / Self Care | Attending: Gastroenterology

## 2020-04-13 DIAGNOSIS — Z794 Long term (current) use of insulin: Secondary | ICD-10-CM | POA: Insufficient documentation

## 2020-04-13 DIAGNOSIS — Z79899 Other long term (current) drug therapy: Secondary | ICD-10-CM | POA: Insufficient documentation

## 2020-04-13 DIAGNOSIS — Z7982 Long term (current) use of aspirin: Secondary | ICD-10-CM | POA: Insufficient documentation

## 2020-04-13 DIAGNOSIS — Z7984 Long term (current) use of oral hypoglycemic drugs: Secondary | ICD-10-CM | POA: Diagnosis not present

## 2020-04-13 DIAGNOSIS — Z860100 Personal history of colon polyps, unspecified: Secondary | ICD-10-CM

## 2020-04-13 DIAGNOSIS — Z1211 Encounter for screening for malignant neoplasm of colon: Secondary | ICD-10-CM | POA: Diagnosis not present

## 2020-04-13 DIAGNOSIS — D125 Benign neoplasm of sigmoid colon: Secondary | ICD-10-CM | POA: Diagnosis not present

## 2020-04-13 DIAGNOSIS — Z888 Allergy status to other drugs, medicaments and biological substances status: Secondary | ICD-10-CM | POA: Diagnosis not present

## 2020-04-13 DIAGNOSIS — K635 Polyp of colon: Secondary | ICD-10-CM

## 2020-04-13 DIAGNOSIS — F17210 Nicotine dependence, cigarettes, uncomplicated: Secondary | ICD-10-CM | POA: Diagnosis not present

## 2020-04-13 DIAGNOSIS — K5730 Diverticulosis of large intestine without perforation or abscess without bleeding: Secondary | ICD-10-CM | POA: Diagnosis not present

## 2020-04-13 DIAGNOSIS — D124 Benign neoplasm of descending colon: Secondary | ICD-10-CM | POA: Diagnosis not present

## 2020-04-13 HISTORY — PX: COLONOSCOPY WITH PROPOFOL: SHX5780

## 2020-04-13 HISTORY — PX: POLYPECTOMY: SHX5525

## 2020-04-13 LAB — GLUCOSE, CAPILLARY: Glucose-Capillary: 127 mg/dL — ABNORMAL HIGH (ref 70–99)

## 2020-04-13 SURGERY — COLONOSCOPY WITH PROPOFOL
Anesthesia: General | Site: Rectum

## 2020-04-13 MED ORDER — ACETAMINOPHEN 160 MG/5ML PO SOLN: 325.0000 mg | Freq: Once | ORAL | Status: DC

## 2020-04-13 MED ORDER — STERILE WATER FOR IRRIGATION IR SOLN: Status: DC | PRN

## 2020-04-13 MED ORDER — ACETAMINOPHEN 325 MG PO TABS: 325.0000 mg | ORAL_TABLET | Freq: Once | ORAL | Status: DC

## 2020-04-13 MED ORDER — LACTATED RINGERS IV SOLN: INTRAVENOUS | Status: DC

## 2020-04-13 MED ORDER — PROPOFOL 10 MG/ML IV BOLUS
INTRAVENOUS | Status: DC | PRN
  Administered 2020-04-13: 100 mg via INTRAVENOUS
  Administered 2020-04-13 (×6): 20 mg via INTRAVENOUS
  Administered 2020-04-13: 30 mg via INTRAVENOUS

## 2020-04-13 MED ORDER — LIDOCAINE HCL (CARDIAC) PF 100 MG/5ML IV SOSY
PREFILLED_SYRINGE | INTRAVENOUS | Status: DC | PRN
  Administered 2020-04-13: 20 mg via INTRAVENOUS

## 2020-04-13 SURGICAL SUPPLY — 8 items
GOWN CVR UNV OPN BCK APRN NK (MISCELLANEOUS) ×2 IMPLANT
GOWN ISOL THUMB LOOP REG UNIV (MISCELLANEOUS) ×4
KIT PRC NS LF DISP ENDO (KITS) ×1 IMPLANT
KIT PROCEDURE OLYMPUS (KITS) ×2
MANIFOLD NEPTUNE II (INSTRUMENTS) ×2 IMPLANT
SNARE COLD EXACTO (MISCELLANEOUS) ×2 IMPLANT
TRAP ETRAP POLY (MISCELLANEOUS) ×2 IMPLANT
WATER STERILE IRR 250ML POUR (IV SOLUTION) ×2 IMPLANT

## 2020-04-13 NOTE — H&P (Signed)
Lucilla Lame, MD [address] [place], [place] [address] [phone] Fax : [phone]  Primary Care Physician:  Venita Lick, NP Primary Gastroenterologist:  Dr. Allen Norris  Pre-Procedure History & Physical: HPI:  April Johnson is a 65 y.o. female is here for an colonoscopy.   Past Medical History:  Diagnosis Date  . Acute bronchitis   . Allergy   . Arthritis    right ankle  . Complication of anesthesia    difficult to wake up  . Diabetes mellitus without complication ([place])    type 2  . Diarrhea   . Esophagitis   . Excessive menstruation   . GERD (gastroesophageal reflux disease)   . Hyperlipidemia   . Hypertension    CONTROLLED ON MEDS  . Long term current use of insulin ([place])   . Neuropathy    left leg and feet  . Shortness of breath dyspnea    upon exertion  . Wears partial dentures    upper / does not wear    Past Surgical History:  Procedure Laterality Date  . CHOLECYSTECTOMY    . COLONOSCOPY WITH PROPOFOL N/A 01/12/2015   Procedure: COLONOSCOPY WITH PROPOFOL;  Surgeon: Lucilla Lame, MD;  Location: [facility];  Service: Endoscopy;  Laterality: N/A;  DIABETIC-INSULIN DEPENDENT  . OPEN REDUCTION INTERNAL FIXATION (ORIF) DISTAL RADIAL FRACTURE Right 01/17/2019   Procedure: OPEN REDUCTION INTERNAL FIXATION (ORIF) DISTAL RADIAL FRACTURE;  Surgeon: Hessie Knows, MD;  Location: [facility];  Service: Orthopedics;  Laterality: Right;  . POLYPECTOMY  01/12/2015   Procedure: POLYPECTOMY;  Surgeon: Lucilla Lame, MD;  Location: [facility];  Service: Endoscopy;;  . WRIST SURGERY Right    ganglion cyst    Prior to Admission medications   Medication Sig Start Date End Date Taking? Authorizing Provider  amLODipine (NORVASC) 5 MG tablet Take 1 tablet (5 mg total) by mouth daily. 04/01/20  Yes Cannady, Henrine Screws T, NP  aspirin 81 MG tablet Take 81 mg by mouth daily. am   Yes [provider]  atorvastatin (LIPITOR) 20 MG tablet  Take 1 tablet (20 mg total) by mouth daily at 6 PM. 04/01/20  Yes Cannady, [name] T, NP  gabapentin (NEURONTIN) 300 MG capsule Take 1 capsule (300 mg total) by mouth 2 (two) times daily. 04/01/20  Yes Cannady, [name] T, NP  HYDROcodone-acetaminophen (NORCO) 5-325 MG tablet Take 1 tablet by mouth every 6 (six) hours as needed for moderate pain. 07/25/19  Yes Duanne Guess, PA-C  LEVEMIR FLEXTOUCH 100 UNIT/ML FlexPen INJECT 60 UNITS INTO THE SKIN AT BEDTIME. 04/09/20  Yes Cannady, [name] T, NP  metFORMIN (GLUCOPHAGE) 500 MG tablet Take 2 tablets (1,000 mg total) by mouth 2 (two) times daily with a meal. 04/01/20  Yes Cannady, [name] T, NP  metoprolol succinate (TOPROL-XL) 100 MG 24 hr tablet TAKE 1 TABLET (100 MG TOTAL) BY MOUTH DAILY. TAKE WITH OR IMMEDIATELY FOLLOWING A MEAL. 04/01/20  Yes Cannady, [name], NP  Multiple Vitamins-Minerals (MULTIVITAMIN ADULT) CHEW Chew by mouth daily.   Yes [provider]  omeprazole (PRILOSEC) 20 MG capsule TAKE 1 CAPSULE BY MOUTH EVERY DAY 12/25/19  Yes Cannady, [name] T, NP  OZEMPIC, 1 MG/DOSE, 4 MG/3ML SOPN INJECT 1 MG UNDER SKIN WEEKLY 01/24/20  Yes Cannady, [name] T, NP  telmisartan (MICARDIS) 80 MG tablet Take 1 tablet (80 mg total) by mouth daily. 04/01/20  Yes Cannady, [name], NP  B-D ULTRAFINE III SHORT PEN 31G X 8 MM MISC  USE AS DIRECTED 02/23/15   Guadalupe Maple, MD  Blood Glucose Monitoring Suppl (ONETOUCH VERIO) w/Device KIT Use glucometer to check blood sugar up to 3 times a day 09/03/19   Marnee Guarneri T, NP  dapagliflozin propanediol (FARXIGA) 5 MG TABS tablet Take 1 tablet (5 mg total) by mouth daily. 08/21/19   Marnee Guarneri T, NP  glucose blood test strip Use as instructed 09/03/19   Marnee Guarneri T, NP  Lancets (ONETOUCH ULTRASOFT) lancets Use as instructed 09/03/19   Cannady, Henrine Screws T, NP  nystatin (MYCOSTATIN/NYSTOP) powder Apply 1 application topically 2 (two) times daily as needed (yeast). 06/11/19   Cannady, [name] T, NP   ondansetron (ZOFRAN) 4 MG tablet TAKE 1 TABLET BY MOUTH EVERY 8 HOURS AS NEEDED FOR NAUSEA AND VOMITING 10/21/19   Marnee Guarneri T, NP  polyethylene glycol-electrolytes (NULYTELY) 420 g solution Drink one 8 oz glass every 20 mins until entire container is finished starting at 5:00pm on 04/13/20 04/10/20   Lucilla Lame, MD  scopolamine (TRANSDERM-SCOP) 1 MG/3DAYS Place 1 patch (1.5 mg total) onto the skin every 3 (three) days. Patient not taking: No sig reported 11/27/19   Kathrine Haddock, NP  Semaglutide, 1 MG/DOSE, 2 MG/1.5ML SOPN Inject 1 mg into the skin once a week. Patient not taking: Reported on 04/07/2020 12/25/18   Marnee Guarneri T, NP    Allergies as of 04/07/2020 - Review Complete 04/07/2020  Allergen Reaction Noted  . Niacin and related Other (See Comments) 09/16/2014  . Cyclobenzaprine Rash 04/04/2017    Family History  Problem Relation Age of Onset  . Diabetes Sister   . Diabetes Brother   . Stroke Paternal Grandmother   . Diabetes Sister   . Diabetes Sister     Social History   Socioeconomic History  . Marital status: Married    Spouse name: Not on file  . Number of children: Not on file  . Years of education: Not on file  . Highest education level: Not on file  Occupational History  . Not on file  Tobacco Use  . Smoking status: Current Every Day Smoker    Packs/day: 0.25    Years: 10.00    Pack years: 2.50    Types: Cigarettes  . Smokeless tobacco: Never Used  Vaping Use  . Vaping Use: Never used  Substance and Sexual Activity  . Alcohol use: No    Alcohol/week: 0.0 standard drinks  . Drug use: No  . Sexual activity: Never  Other Topics Concern  . Not on file  Social History Narrative  . Not on file   Social Determinants of Health   Financial Resource Strain: Not on file  Food Insecurity: Not on file  Transportation Needs: Not on file  Physical Activity: Not on file  Stress: Not on file  Social Connections: Not on file  Intimate Partner Violence:  Not on file    Review of Systems: See HPI, otherwise negative ROS  Physical Exam: BP 134/76   Pulse 81   Temp (!) 97.4 F (36.3 C) (Temporal)   Resp 16   Ht 5' 4.5" (1.638 m)   Wt 80.6 kg   LMP  (LMP Unknown)   SpO2 97%   BMI 30.01 kg/m  General:   Alert,  pleasant and cooperative in NAD Head:  Normocephalic and atraumatic. Neck:  Supple; no masses or thyromegaly. Lungs:  Clear throughout to auscultation.    Heart:  Regular rate and rhythm. Abdomen:  Soft, nontender and nondistended. Normal  bowel sounds, without guarding, and without rebound.   Neurologic:  Alert and  oriented x4;  grossly normal neurologically.  Impression/Plan: April Johnson is here for an colonoscopy to be performed for a history of adenomatous polyps on 01/2015  Risks, benefits, limitations, and alternatives regarding  colonoscopy have been reviewed with the patient.  Questions have been answered.  All parties agreeable.   Lucilla Lame, MD  04/13/2020, 9:37 AM

## 2020-04-13 NOTE — Anesthesia Postprocedure Evaluation (Signed)
Anesthesia Post Note  Patient: April Johnson  Procedure(s) Performed: COLONOSCOPY WITH BIOPSY (N/A Rectum) POLYPECTOMY (N/A Rectum)     Patient location during evaluation: PACU Anesthesia Type: General Level of consciousness: awake and alert and oriented Pain management: satisfactory to patient Vital Signs Assessment: post-procedure vital signs reviewed and stable Respiratory status: spontaneous breathing, nonlabored ventilation and respiratory function stable Cardiovascular status: blood pressure returned to baseline and stable Postop Assessment: Adequate PO intake and No signs of nausea or vomiting Anesthetic complications: no   No complications documented.  [name]

## 2020-04-13 NOTE — Op Note (Signed)
[facility] Gastroenterology Patient Name: April Johnson Procedure Date: 04/13/2020 9:41 AM MRN: [id number] Account #: [id number] Date of Birth: [birth year] Admit Type: Outpatient Age: 65 Room: [facility] OR ROOM 01 Gender: Female Note Status: Finalized Procedure:             Colonoscopy Indications:           High risk colon cancer surveillance: Personal history                         of colonic polyps Providers:             Lucilla Lame MD, MD Referring MD:          Franchot Gallo (Referring MD) Medicines:             Propofol per Anesthesia Complications:         No immediate complications. Procedure:             Pre-Anesthesia Assessment:                        - Prior to the procedure, a History and Physical was                         performed, and patient medications and allergies were                         reviewed. The patient's tolerance of previous                         anesthesia was also reviewed. The risks and benefits                         of the procedure and the sedation options and risks                         were discussed with the patient. All questions were                         answered, and informed consent was obtained. Prior                         Anticoagulants: The patient has taken no previous                         anticoagulant or antiplatelet agents. ASA Grade                         Assessment: II - A patient with mild systemic disease.                         After reviewing the risks and benefits, the patient                         was deemed in satisfactory condition to undergo the                         procedure.  After obtaining informed consent, the colonoscope was                         passed under direct vision. Throughout the procedure,                         the patient's blood pressure, pulse, and oxygen                         saturations were monitored continuously. The                          Colonoscope was introduced through the anus and                         advanced to the the cecum, identified by appendiceal                         orifice and ileocecal valve. The colonoscopy was                         performed without difficulty. The patient tolerated                         the procedure well. The quality of the bowel                         preparation was excellent. Findings:      The perianal and digital rectal examinations were normal.      Five sessile polyps were found in the sigmoid colon and descending       colon. The polyps were 3 to 5 mm in size. These polyps were removed with       a cold snare. Resection and retrieval were complete.      Multiple small-mouthed diverticula were found in the sigmoid colon. Impression:            - Five 3 to 5 mm polyps in the sigmoid colon and in                         the descending colon, removed with a cold snare.                         Resected and retrieved.                        - Diverticulosis in the sigmoid colon. Recommendation:        - Discharge patient to home.                        - Resume previous diet.                        - Continue present medications.                        - Await pathology results.                        - Repeat colonoscopy in 5 years for surveillance. Procedure  Code(s):     --- Professional ---                        [phone], Colonoscopy, flexible; with removal of                         tumor(s), polyp(s), or other lesion(s) by snare                         technique Diagnosis Code(s):     --- Professional ---                        Z86.010, Personal history of colonic polyps                        K63.5, Polyp of colon CPT copyright 2019 American Medical Association. All rights reserved. The codes documented in this report are preliminary and upon coder review may  be revised to meet current compliance requirements. Lucilla Lame MD, MD 04/13/2020 10:12:59 AM This  report has been signed electronically. Number of Addenda: 0 Note Initiated On: 04/13/2020 9:41 AM Scope Withdrawal Time: 0 hours 11 minutes 24 seconds  Total Procedure Duration: 0 hours 16 minutes 21 seconds  Estimated Blood Loss:  Estimated blood loss: none.      [facility]

## 2020-04-13 NOTE — Transfer of Care (Signed)
Immediate Anesthesia Transfer of Care Note  Patient: April Johnson  Procedure(s) Performed: COLONOSCOPY WITH BIOPSY (N/A Rectum) POLYPECTOMY (N/A Rectum)  Patient Location: PACU  Anesthesia Type: General  Level of Consciousness: awake, alert  and patient cooperative  Airway and Oxygen Therapy: Patient Spontanous Breathing  Post-op Assessment: Post-op Vital signs reviewed, Patient's Cardiovascular Status Stable, Respiratory Function Stable, Patent Airway and No signs of Nausea or vomiting  Post-op Vital Signs: Reviewed and stable  Complications: No complications documented.

## 2020-04-13 NOTE — Anesthesia Preprocedure Evaluation (Signed)
Anesthesia Evaluation  Patient identified by MRN, date of birth, ID band Patient awake    Reviewed: Allergy & Precautions, H&P , NPO status , Patient's Chart, lab work & pertinent test results  Airway Mallampati: II  TM Distance: >3 FB Neck ROM: full    Dental no notable dental hx.    Pulmonary Current Smoker,    Pulmonary exam normal breath sounds clear to auscultation       Cardiovascular hypertension, Normal cardiovascular exam Rhythm:regular Rate:Normal     Neuro/Psych  Neuromuscular disease    GI/Hepatic GERD  ,  Endo/Other  diabetes  Renal/GU      Musculoskeletal   Abdominal   Peds  Hematology   Anesthesia Other Findings   Reproductive/Obstetrics                             Anesthesia Physical Anesthesia Plan  ASA: II  Anesthesia Plan: General   Post-op Pain Management:    Induction: Intravenous  PONV Risk Score and Plan: 3 and Treatment may vary due to age or medical condition, Propofol infusion and TIVA  Airway Management Planned: Natural Airway  Additional Equipment:   Intra-op Plan:   Post-operative Plan:   Informed Consent: I have reviewed the patients History and Physical, chart, labs and discussed the procedure including the risks, benefits and alternatives for the proposed anesthesia with the patient or authorized representative who has indicated his/her understanding and acceptance.     Dental Advisory Given  Plan Discussed with: CRNA  Anesthesia Plan Comments:         Anesthesia Quick Evaluation

## 2020-04-13 NOTE — Anesthesia Procedure Notes (Signed)
Procedure Name: MAC Performed by: Jackye Dever, CRNA Pre-anesthesia Checklist: Patient identified, Emergency Drugs available, Suction available, Timeout performed and Patient being monitored Patient Re-evaluated:Patient Re-evaluated prior to induction Oxygen Delivery Method: Nasal cannula Placement Confirmation: positive ETCO2       

## 2020-04-14 ENCOUNTER — Encounter: Payer: Self-pay | Admitting: Gastroenterology

## 2020-04-15 LAB — SURGICAL PATHOLOGY

## 2020-04-16 ENCOUNTER — Encounter: Payer: Self-pay | Admitting: Gastroenterology

## 2020-06-05 ENCOUNTER — Other Ambulatory Visit: Payer: Self-pay | Admitting: Nurse Practitioner

## 2020-06-05 NOTE — Telephone Encounter (Signed)
Patient last seen in office on 04/01/20 and patient has upcoming appointment on 07/02/20. Please advise.

## 2020-06-05 NOTE — Telephone Encounter (Signed)
Requested medication (s) are due for refill today: yes  Requested medication (s) are on the active medication list: yes  Last refill:  05/20/2020  Future visit scheduled: yes  Notes to clinic:  this refill cannot be delegated    Requested Prescriptions  Pending Prescriptions Disp Refills   ondansetron (ZOFRAN) 4 MG tablet [Pharmacy Med Name: ONDANSETRON HCL 4 MG TABLET] 21 tablet 8    Sig: TAKE 1 TABLET BY MOUTH EVERY 8 HOURS AS NEEDED FOR NAUSEA AND VOMITING      Not Delegated - Gastroenterology: Antiemetics Failed - 06/05/2020 11:58 AM      Failed - This refill cannot be delegated      Passed - Valid encounter within last 6 months    Recent Outpatient Visits           2 months ago Type 2 diabetes mellitus with obesity ([place])   [place], [name] T, NP   5 months ago Type 2 diabetes mellitus with obesity ([place])   [place], [name] T, NP   9 months ago Type 2 diabetes mellitus with obesity ([place])   [place], [name], NP   1 year ago Annual physical exam   [place], [name] T, NP   1 year ago Type 2 diabetes mellitus with stage 2 chronic kidney disease, with long-term current use of insulin ([place])   [place], [name], NP       Future Appointments             In 3 weeks [place], [name], NP [employer] [facility]

## 2020-07-02 ENCOUNTER — Other Ambulatory Visit: Payer: Self-pay

## 2020-07-02 ENCOUNTER — Ambulatory Visit (INDEPENDENT_AMBULATORY_CARE_PROVIDER_SITE_OTHER): Payer: Commercial Managed Care - PPO | Admitting: Nurse Practitioner

## 2020-07-02 ENCOUNTER — Encounter: Payer: Self-pay | Admitting: Nurse Practitioner

## 2020-07-02 VITALS — BP 135/79 | HR 71 | Temp 98.7°F | Ht 64.0 in | Wt 175.0 lb

## 2020-07-02 DIAGNOSIS — E1169 Type 2 diabetes mellitus with other specified complication: Secondary | ICD-10-CM

## 2020-07-02 DIAGNOSIS — E1142 Type 2 diabetes mellitus with diabetic polyneuropathy: Secondary | ICD-10-CM | POA: Diagnosis not present

## 2020-07-02 DIAGNOSIS — E6609 Other obesity due to excess calories: Secondary | ICD-10-CM

## 2020-07-02 DIAGNOSIS — E1159 Type 2 diabetes mellitus with other circulatory complications: Secondary | ICD-10-CM

## 2020-07-02 DIAGNOSIS — Z794 Long term (current) use of insulin: Secondary | ICD-10-CM | POA: Diagnosis not present

## 2020-07-02 DIAGNOSIS — Z0000 Encounter for general adult medical examination without abnormal findings: Secondary | ICD-10-CM

## 2020-07-02 DIAGNOSIS — Z1231 Encounter for screening mammogram for malignant neoplasm of breast: Secondary | ICD-10-CM | POA: Diagnosis not present

## 2020-07-02 DIAGNOSIS — E785 Hyperlipidemia, unspecified: Secondary | ICD-10-CM

## 2020-07-02 DIAGNOSIS — K209 Esophagitis, unspecified without bleeding: Secondary | ICD-10-CM

## 2020-07-02 DIAGNOSIS — E669 Obesity, unspecified: Secondary | ICD-10-CM

## 2020-07-02 DIAGNOSIS — I152 Hypertension secondary to endocrine disorders: Secondary | ICD-10-CM

## 2020-07-02 DIAGNOSIS — Z6830 Body mass index (BMI) 30.0-30.9, adult: Secondary | ICD-10-CM

## 2020-07-02 MED ORDER — BD PEN NEEDLE SHORT U/F 31G X 8 MM MISC: 12 refills | Status: DC

## 2020-07-02 MED ORDER — SHINGRIX 50 MCG/0.5ML IM SUSR: 0.5000 mL | Freq: Once | INTRAMUSCULAR | 0 refills | Status: AC

## 2020-07-02 MED ORDER — DAPAGLIFLOZIN PROPANEDIOL 5 MG PO TABS: 5.0000 mg | ORAL_TABLET | Freq: Every day | ORAL | 4 refills | Status: DC

## 2020-07-02 MED ORDER — LEVEMIR FLEXTOUCH 100 UNIT/ML ~~LOC~~ SOPN: 60.0000 [IU] | PEN_INJECTOR | Freq: Every day | SUBCUTANEOUS | 4 refills | Status: DC

## 2020-07-02 MED ORDER — OMEPRAZOLE 20 MG PO CPDR
DELAYED_RELEASE_CAPSULE | ORAL | 4 refills | Status: DC
Start: 2020-07-02 — End: 2021-07-21

## 2020-07-02 MED ORDER — OZEMPIC (1 MG/DOSE) 4 MG/3ML ~~LOC~~ SOPN: PEN_INJECTOR | SUBCUTANEOUS | 4 refills | Status: DC

## 2020-07-02 NOTE — Assessment & Plan Note (Signed)
Chronic, ongoing.  Continue current medication regimen and adjust as needed based on labs.  Refills sent in.  Obtain lipid panel today.

## 2020-07-02 NOTE — Progress Notes (Signed)
BP 135/79   Pulse 71   Temp 98.7 F (37.1 C) (Oral)   Ht _0  (1.626 m)   Wt 175 lb (79.4 kg)   LMP  (LMP Unknown)   SpO2 97%   BMI 30.04 kg/m    Subjective:    [name], female    DOB: 06/25/1955, 65 y.o.   MRN: [id number]  HPI: April Johnson is a 65 y.o. female presenting on 07/02/2020 for comprehensive medical examination. Current medical complaints include:none  She currently lives with: husband Menopausal Symptoms: no   DIABETES Taking Levemir 30 units every other night. Also taking Ozempic 1MG, Metformin 1000 MG BID, Farxiga 5 MG QDAY. Last A1C was 6% and provider had recommended continued reduction of insulin to avoid hypoglycemia.  Reiterated importance of decreasing Levemir by2units every 3 days based on morning blood sugar readings. Takes her Gabapentin twice a day for neuropathy, helps her rest.  Does endorse some nausea the first 2 days after taking Ozempic -- takes Zofran for this with benefit. Hypoglycemic episodes:no Polydipsia/polyuria:no Visual disturbance:no Chest pain:no Paresthesias:no Glucose Monitoring:yes Accucheck frequency: Daily Fasting glucose: 110 to 130 Post prandial: Evening: 180's Before meals: Taking Insulin?:yes Long acting insulin: 30 units every other day  Short acting insulin: Blood Pressure Monitoring:not checking Retinal Examination:Not up to Date Foot Exam:Up to Date Pneumovax:Up to Date Influenza:Up to Date Aspirin:yes  HYPERTENSION / HYPERLIPIDEMIAWITH CKD Continues on Amlodipine, ASA, Lipitor, Metoprolol, and Micardis.Last CRT 0.77 and GFR 82. Satisfied with current treatment?yes Duration of hypertension:chronic BP monitoring frequency:not checking BP range:  BP medication side effects:no Duration of hyperlipidemia:chronic Cholesterol medication side effects:no Cholesterol  supplements: none Medication compliance:good compliance Aspirin:yes Recent stressors:no Recurrent headaches:no Visual changes:no Palpitations:no Dyspnea:no Chest pain:no Lower extremity edema:no Dizzy/lightheaded:no  GERD Continues on Omeprazole 20 MG daily. GERD control status: controlled  Satisfied with current treatment? yes Heartburn frequency: none Medication side effects: no  Medication compliance: stable Previous GERD medications: none Antacid use frequency:  none Dysphagia: no Odynophagia:  no Hematemesis: no Blood in stool: no EGD: no   Depression Screen done today and results listed below:  Depression screen [facility] 2/9 04/01/2020 06/03/2019 04/26/2018 02/13/2018 10/24/2017  Decreased Interest 0 0 0 0 0  Down, Depressed, Hopeless 0 0 0 0 0  PHQ - 2 Score 0 0 0 0 0  Altered sleeping 0 - - 3 1  Tired, decreased energy 0 - - 3 1  Change in appetite 0 - - 3 1  Feeling bad or failure about yourself  0 - - 0 0  Trouble concentrating 0 - - 0 0  Moving slowly or fidgety/restless 0 - - 0 0  Suicidal thoughts 0 - - 0 0  PHQ-9 Score 0 - - 9 3  Difficult doing work/chores - - - Not difficult at all -    The patient has a history of falls. I did complete a risk assessment for falls. A plan of care for falls was documented.   Past Medical History:  Past Medical History:  Diagnosis Date  . Acute bronchitis   . Allergy   . Arthritis    right ankle  . Complication of anesthesia    difficult to wake up  . Diabetes mellitus without complication ([place])    type 2  . Diarrhea   . Esophagitis   . Excessive menstruation   . GERD (gastroesophageal reflux disease)   . Hyperlipidemia   . Hypertension    CONTROLLED ON MEDS  .  Long term current use of insulin ([place])   . Neuropathy    left leg and feet  . Shortness of breath dyspnea    upon exertion  . Wears partial dentures    upper / does not wear    Surgical History:  Past Surgical History:  Procedure  Laterality Date  . CHOLECYSTECTOMY    . COLONOSCOPY WITH PROPOFOL N/A 01/12/2015   Procedure: COLONOSCOPY WITH PROPOFOL;  Surgeon: Lucilla Lame, MD;  Location: [facility];  Service: Endoscopy;  Laterality: N/A;  DIABETIC-INSULIN DEPENDENT  . COLONOSCOPY WITH PROPOFOL N/A 04/13/2020   Procedure: COLONOSCOPY WITH BIOPSY;  Surgeon: Lucilla Lame, MD;  Location: [place];  Service: Endoscopy;  Laterality: N/A;  . OPEN REDUCTION INTERNAL FIXATION (ORIF) DISTAL RADIAL FRACTURE Right 01/17/2019   Procedure: OPEN REDUCTION INTERNAL FIXATION (ORIF) DISTAL RADIAL FRACTURE;  Surgeon: Hessie Knows, MD;  Location: [facility];  Service: Orthopedics;  Laterality: Right;  . POLYPECTOMY  01/12/2015   Procedure: POLYPECTOMY;  Surgeon: Lucilla Lame, MD;  Location: [place];  Service: Endoscopy;;  . POLYPECTOMY N/A 04/13/2020   Procedure: POLYPECTOMY;  Surgeon: Lucilla Lame, MD;  Location: [place];  Service: Endoscopy;  Laterality: N/A;  . WRIST SURGERY Right    ganglion cyst    Medications:  Current Outpatient Medications on File Prior to Visit  Medication Sig  . amLODipine (NORVASC) 5 MG tablet Take 1 tablet (5 mg total) by mouth daily.  Marland Kitchen aspirin 81 MG tablet Take 81 mg by mouth daily. am  . atorvastatin (LIPITOR) 20 MG tablet Take 1 tablet (20 mg total) by mouth daily at 6 PM.  . Blood Glucose Monitoring Suppl (ONETOUCH VERIO) w/Device KIT Use glucometer to check blood sugar up to 3 times a day  . gabapentin (NEURONTIN) 300 MG capsule Take 1 capsule (300 mg total) by mouth 2 (two) times daily.  Marland Kitchen glucose blood test strip Use as instructed  . HYDROcodone-acetaminophen (NORCO) 5-325 MG tablet Take 1 tablet by mouth every 6 (six) hours as needed for moderate pain.  . Lancets (ONETOUCH ULTRASOFT) lancets Use as instructed  . meloxicam (MOBIC) 7.5 MG tablet Take 1 tablet by mouth daily.  . metFORMIN (GLUCOPHAGE) 500 MG tablet Take 2 tablets (1,000 mg total) by mouth 2 (two)  times daily with a meal.  . metoprolol succinate (TOPROL-XL) 100 MG 24 hr tablet TAKE 1 TABLET (100 MG TOTAL) BY MOUTH DAILY. TAKE WITH OR IMMEDIATELY FOLLOWING A MEAL.  . Multiple Vitamins-Minerals (MULTIVITAMIN ADULT) CHEW Chew by mouth daily.  Marland Kitchen nystatin (MYCOSTATIN/NYSTOP) powder Apply 1 application topically 2 (two) times daily as needed (yeast).  . ondansetron (ZOFRAN) 4 MG tablet TAKE 1 TABLET BY MOUTH EVERY 8 HOURS AS NEEDED FOR NAUSEA AND VOMITING  . telmisartan (MICARDIS) 80 MG tablet Take 1 tablet (80 mg total) by mouth daily.   No current facility-administered medications on file prior to visit.    Allergies:  Allergies  Allergen Reactions  . Niacin And Related Other (See Comments)    Made her feel as if she was "burning"/skin redness  . Cyclobenzaprine Rash    Social History:  Social History   Socioeconomic History  . Marital status: Married    Spouse name: Not on file  . Number of children: Not on file  . Years of education: Not on file  . Highest education level: Not on file  Occupational History  . Not on file  Tobacco Use  . Smoking status: Current Some Day Smoker  Packs/day: 0.25    Years: 10.00    Pack years: 2.50    Types: Cigarettes  . Smokeless tobacco: Never Used  Vaping Use  . Vaping Use: Never used  Substance and Sexual Activity  . Alcohol use: No    Alcohol/week: 0.0 standard drinks  . Drug use: No  . Sexual activity: Never  Other Topics Concern  . Not on file  Social History Narrative  . Not on file   Social Determinants of Health   Financial Resource Strain: Not on file  Food Insecurity: Not on file  Transportation Needs: Not on file  Physical Activity: Not on file  Stress: Not on file  Social Connections: Not on file  Intimate Partner Violence: Not on file   Social History   Tobacco Use  Smoking Status Current Some Day Smoker  . Packs/day: 0.25  . Years: 10.00  . Pack years: 2.50  . Types: Cigarettes  Smokeless Tobacco  Never Used   Social History   Substance and Sexual Activity  Alcohol Use No  . Alcohol/week: 0.0 standard drinks    Family History:  Family History  Problem Relation Age of Onset  . Diabetes Sister   . Diabetes Brother   . Stroke Paternal Grandmother   . Diabetes Sister   . Diabetes Sister     Past medical history, surgical history, medications, allergies, family history and social history reviewed with patient today and changes made to appropriate areas of the chart.   Review of Systems - negative All other ROS negative except what is listed above and in the HPI.      Objective:    BP 135/79   Pulse 71   Temp 98.7 F (37.1 C) (Oral)   Ht _0  (1.626 m)   Wt 175 lb (79.4 kg)   LMP  (LMP Unknown)   SpO2 97%   BMI 30.04 kg/m   Wt Readings from Last 3 Encounters:  07/02/20 175 lb (79.4 kg)  04/13/20 177 lb 9.6 oz (80.6 kg)  04/01/20 175 lb 6.4 oz (79.6 kg)    Physical Exam Vitals and nursing note reviewed.  Constitutional:      General: She is awake. She is not in acute distress.    Appearance: She is well-developed. She is not ill-appearing.  HENT:     Head: Normocephalic and atraumatic.     Right Ear: Hearing, tympanic membrane, ear canal and external ear normal. No drainage.     Left Ear: Hearing, tympanic membrane, ear canal and external ear normal. No drainage.     Nose: Nose normal.     Right Sinus: No maxillary sinus tenderness or frontal sinus tenderness.     Left Sinus: No maxillary sinus tenderness or frontal sinus tenderness.     Mouth/Throat:     Mouth: Mucous membranes are moist.     Pharynx: Oropharynx is clear. Uvula midline. No pharyngeal swelling, oropharyngeal exudate or posterior oropharyngeal erythema.  Eyes:     General: Lids are normal.        Right eye: No discharge.        Left eye: No discharge.     Extraocular Movements: Extraocular movements intact.     Conjunctiva/sclera: Conjunctivae normal.     Pupils: Pupils are equal, round,  and reactive to light.     Visual Fields: Right eye visual fields normal and left eye visual fields normal.  Neck:     Thyroid: No thyromegaly.     Vascular: No  carotid bruit.     Trachea: Trachea normal.  Cardiovascular:     Rate and Rhythm: Normal rate and regular rhythm.     Heart sounds: Normal heart sounds. No murmur heard. No gallop.   Pulmonary:     Effort: Pulmonary effort is normal. No accessory muscle usage or respiratory distress.     Breath sounds: Normal breath sounds.  Chest:  Breasts:     Right: Normal. No axillary adenopathy or supraclavicular adenopathy.     Left: Normal. No axillary adenopathy or supraclavicular adenopathy.    Abdominal:     General: Bowel sounds are normal.     Palpations: Abdomen is soft. There is no hepatomegaly or splenomegaly.     Tenderness: There is no abdominal tenderness.  Musculoskeletal:        General: Normal range of motion.     Cervical back: Normal range of motion and neck supple.     Right lower leg: No edema.     Left lower leg: No edema.  Lymphadenopathy:     Head:     Right side of head: No submental, submandibular, tonsillar, preauricular or posterior auricular adenopathy.     Left side of head: No submental, submandibular, tonsillar, preauricular or posterior auricular adenopathy.     Cervical: No cervical adenopathy.     Upper Body:     Right upper body: No supraclavicular, axillary or pectoral adenopathy.     Left upper body: No supraclavicular, axillary or pectoral adenopathy.  Skin:    General: Skin is warm and dry.     Capillary Refill: Capillary refill takes less than 2 seconds.     Findings: No rash.  Neurological:     Mental Status: She is alert and oriented to person, place, and time.     Cranial Nerves: Cranial nerves are intact.     Gait: Gait is intact.     Deep Tendon Reflexes: Reflexes are normal and symmetric.     Reflex Scores:      Brachioradialis reflexes are 2+ on the right side and 2+ on the left  side.      Patellar reflexes are 2+ on the right side and 2+ on the left side. Psychiatric:        Attention and Perception: Attention normal.        Mood and Affect: Mood normal.        Speech: Speech normal.        Behavior: Behavior normal. Behavior is cooperative.        Thought Content: Thought content normal.        Judgment: Judgment normal.    Results for orders placed or performed during the hospital encounter of 04/13/20  Glucose, capillary  Result Value Ref Range   Glucose-Capillary 127 (H) 70 - 99 mg/dL  Surgical pathology  Result Value Ref Range   SURGICAL PATHOLOGY      SURGICAL PATHOLOGY CASE: [id number] PATIENT: Gwynneth Aliment Surgical Pathology Report     Specimen Submitted: A. Colon polyp x6, desc(1), sig(5); cold snare  Clinical History: Personal history of colon polyps.      DIAGNOSIS: A. COLON POLYP X6, DESCENDING AND SIGMOID; COLD SNARE: - TUBULAR ADENOMA, TWO FRAGMENTS. - INTRAMUCOSAL LYMPHOID AGGREGATES, MULTIPLE FRAGMENTS. - NEGATIVE FOR HIGH-GRADE DYSPLASIA AND MALIGNANCY.   GROSS DESCRIPTION: A. Labeled: Descending colon, sigmoid colon polyp x5 Received: In formalin Collection time: 9:56 AM on 04/13/2020 Placed into formalin time: 9:56 AM on 04/13/2020 Tissue fragment(s): Multiple Size: Aggregate, 1.3 x  0.9 x 0.1 cm Description: Multiple fragments of yellow-tan soft tissue mixed with intestinal debris.  Ratio of soft tissue to intestinal debris is 95/5. Entirely submitted in cassette 1.  Final Diagnosis performed by Betsy Pries, MD.   Electronically signed 04/15/2020 1:31:53PM The electronic signat ure indicates that the named Attending Pathologist has evaluated the specimen Technical component performed at [facility], [address], [place], [place] [address] Lab: [phone] Dir: Rush Farmer, MD, MMM  Professional component performed at [facility], [facility], [place], [place], [place] [address] Lab:  [phone] Dir: Dellia Nims. Rubinas, MD       Assessment & Plan:   Problem List Items Addressed This Visit      Cardiovascular and Mediastinum   Hypertension associated with diabetes ([place])    Chronic, ongoing with BP at goal today and on home readings. Continue current medication regimen at this time and adjust as needed.  Telmisartan offering kidney protection with her urine ALB recheck on labs today.  Recommend she check BP at least 3 times a week at home + focus on DASH diet.  Continue to monitor kidney function and if GFR < 45 consider d/c Farxiga or reduction Metformin.  Obtain CMP and TSH today.  Refills sent in.  Return in 3 months.      Relevant Medications   dapagliflozin propanediol (FARXIGA) 5 MG TABS tablet   insulin detemir (LEVEMIR FLEXTOUCH) 100 UNIT/ML FlexPen   Semaglutide, 1 MG/DOSE, (OZEMPIC, 1 MG/DOSE,) 4 MG/3ML SOPN   Other Relevant Orders   Bayer DCA Hb A1c Waived   CBC with Differential/Platelet   Comprehensive metabolic panel   TSH     Digestive   Esophagitis    Chronic, stable with no symptoms with Prilosec on board.  Continue current medication regimen and adjust as needed.  Mag level today.      Relevant Orders   Magnesium     Endocrine   Type 2 diabetes mellitus with obesity ([place]) - Primary    Chronic, ongoing with A1c last visit 6%, making her at goal for over a year now -- recheck this outpatient.  Urine ALB 10 and A:C <30 last visit.  Continue Metformin 1000 MG BID + Farxiga and Ozempic, with goal of insulin reduction and possible discontinuation in future.  Reiterated importance of decreasing Levemir by 2 units every 3 days based on morning blood sugar readings. Continue Telmisartan for kidney protection.  Refills sent in.  Return in 3 months.      Relevant Medications   dapagliflozin propanediol (FARXIGA) 5 MG TABS tablet   insulin detemir (LEVEMIR FLEXTOUCH) 100 UNIT/ML FlexPen   Semaglutide, 1 MG/DOSE, (OZEMPIC, 1 MG/DOSE,) 4 MG/3ML SOPN    Other Relevant Orders   Bayer DCA Hb A1c Waived   Hyperlipidemia associated with type 2 diabetes mellitus (HCC)    Chronic, ongoing.  Continue current medication regimen and adjust as needed based on labs.  Refills sent in.  Obtain lipid panel today.      Relevant Medications   dapagliflozin propanediol (FARXIGA) 5 MG TABS tablet   insulin detemir (LEVEMIR FLEXTOUCH) 100 UNIT/ML FlexPen   Semaglutide, 1 MG/DOSE, (OZEMPIC, 1 MG/DOSE,) 4 MG/3ML SOPN   Other Relevant Orders   Bayer DCA Hb A1c Waived   Lipid Panel w/o Chol/HDL Ratio   Diabetic peripheral neuropathy (HCC)    Chronic, ongoing with A1C last visit 6%, making her at goal for over a year now.  Continue Metformin 1000 MG BID + Ozempic and Farxiga,  with goal of insulin reduction and possible discontinuation in future.  Reiterated importance of decreasing Levemir by 2 units every 3 days based on morning blood sugar fasting readings. Continue Gabapentin for neuropathy.  B12 level at goal.  Refills sent in.  Return in 3 months.      Relevant Medications   dapagliflozin propanediol (FARXIGA) 5 MG TABS tablet   insulin detemir (LEVEMIR FLEXTOUCH) 100 UNIT/ML FlexPen   Semaglutide, 1 MG/DOSE, (OZEMPIC, 1 MG/DOSE,) 4 MG/3ML SOPN     Other   Insulin long-term use (HCC)    Continue to recommend reduction of this at home by 2 units every 3 days, with goal to discontinue in future if A1C remains <7.  She is hesitant about this, but highly recommend.      Obesity    BMI 30.504 with ongoing loss.  Has continued to lose weight with Ozempic.  Recommended eating smaller high protein, low fat meals more frequently and exercising 30 mins a day 5 times a week with a goal of 10-15lb weight loss in the next 3 months. Patient voiced their understanding and motivation to adhere to these recommendations.       Relevant Medications   dapagliflozin propanediol (FARXIGA) 5 MG TABS tablet   insulin detemir (LEVEMIR FLEXTOUCH) 100 UNIT/ML FlexPen    Semaglutide, 1 MG/DOSE, (OZEMPIC, 1 MG/DOSE,) 4 MG/3ML SOPN    Other Visit Diagnoses    Encounter for screening mammogram for malignant neoplasm of breast       Mammogram ordered and advised her to schedule this.   Relevant Orders   MM 3D SCREEN BREAST BILATERAL   Annual physical exam       Annual labs today and health maintenance reviewed.       Follow up plan: Return in about 3 months (around 10/01/2020) for T2DM, HTN/HLD, GERD.   LABORATORY TESTING:  - Pap smear: up to date  IMMUNIZATIONS:   - Tdap: Tetanus vaccination status reviewed: last tetanus booster within 10 years. - Influenza: Up to date - Pneumovax: Up to date - Prevnar: Up to date - HPV: Not applicable - Zostavax vaccine: ordered today  SCREENING: -Mammogram: Not Up to date  - Colonoscopy: Up to date next in 2027 - Bone Density: Not applicable  -Hearing Test: Not applicable  -Spirometry: Not applicable   PATIENT COUNSELING:   Advised to take 1 mg of folate supplement per day if capable of pregnancy.   Sexuality: Discussed sexually transmitted diseases, partner selection, use of condoms, avoidance of unintended pregnancy  and contraceptive alternatives.   Advised to avoid cigarette smoking.  I discussed with the patient that most people either abstain from alcohol or drink within safe limits (<=14/week and <=4 drinks/occasion for males, <=7/weeks and <= 3 drinks/occasion for females) and that the risk for alcohol disorders and other health effects rises proportionally with the number of drinks per week and how often a drinker exceeds daily limits.  Discussed cessation/primary prevention of drug use and availability of treatment for abuse.   Diet: Encouraged to adjust caloric intake to maintain  or achieve ideal body weight, to reduce intake of dietary saturated fat and total fat, to limit sodium intake by avoiding high sodium foods and not adding table salt, and to maintain adequate dietary potassium and  calcium preferably from fresh fruits, vegetables, and low-fat dairy products.    Stressed the importance of regular exercise  Injury prevention: Discussed safety belts, safety helmets, smoke detector, smoking near bedding or upholstery.   Dental  health: Discussed importance of regular tooth brushing, flossing, and dental visits.    NEXT PREVENTATIVE PHYSICAL DUE IN 1 YEAR. Return in about 3 months (around 10/01/2020) for T2DM, HTN/HLD, GERD.

## 2020-07-02 NOTE — Assessment & Plan Note (Signed)
Chronic, ongoing with A1c last visit 6%, making her at goal for over a year now -- recheck this outpatient.  Urine ALB 10 and A:C <30 last visit.  Continue Metformin 1000 MG BID + Farxiga and Ozempic, with goal of insulin reduction and possible discontinuation in future.  Reiterated importance of decreasing Levemir by 2 units every 3 days based on morning blood sugar readings. Continue Telmisartan for kidney protection.  Refills sent in.  Return in 3 months.

## 2020-07-02 NOTE — Assessment & Plan Note (Signed)
Continue to recommend reduction of this at home by 2 units every 3 days, with goal to discontinue in future if A1C remains <7.  She is hesitant about this, but highly recommend.

## 2020-07-02 NOTE — Assessment & Plan Note (Signed)
Chronic, ongoing with BP at goal today and on home readings. Continue current medication regimen at this time and adjust as needed.  Telmisartan offering kidney protection with her urine ALB recheck on labs today.  Recommend she check BP at least 3 times a week at home + focus on DASH diet.  Continue to monitor kidney function and if GFR < 45 consider d/c Farxiga or reduction Metformin.  Obtain CMP and TSH today.  Refills sent in.  Return in 3 months.

## 2020-07-02 NOTE — Assessment & Plan Note (Signed)
Chronic, ongoing with A1C last visit 6%, making her at goal for over a year now.  Continue Metformin 1000 MG BID + Ozempic and Farxiga, with goal of insulin reduction and possible discontinuation in future.  Reiterated importance of decreasing Levemir by 2 units every 3 days based on morning blood sugar fasting readings. Continue Gabapentin for neuropathy.  B12 level at goal.  Refills sent in.  Return in 3 months.

## 2020-07-02 NOTE — Assessment & Plan Note (Signed)
BMI 30.504 with ongoing loss.  Has continued to lose weight with Ozempic.  Recommended eating smaller high protein, low fat meals more frequently and exercising 30 mins a day 5 times a week with a goal of 10-15lb weight loss in the next 3 months. Patient voiced their understanding and motivation to adhere to these recommendations.

## 2020-07-02 NOTE — Assessment & Plan Note (Signed)
Chronic, stable with no symptoms with Prilosec on board.  Continue current medication regimen and adjust as needed.  Mag level today. °

## 2020-07-02 NOTE — Patient Instructions (Signed)
Diabetes Mellitus and Nutrition, Adult When you have diabetes, or diabetes mellitus, it is very important to have healthy eating habits because your blood sugar (glucose) levels are greatly affected by what you eat and drink. Eating healthy foods in the right amounts, at about the same times every day, can help you:  Control your blood glucose.  Lower your risk of heart disease.  Improve your blood pressure.  Reach or maintain a healthy weight. What can affect my meal plan? Every person with diabetes is different, and each person has different needs for a meal plan. Your health care provider may recommend that you work with a dietitian to make a meal plan that is best for you. Your meal plan may vary depending on factors such as:  The calories you need.  The medicines you take.  Your weight.  Your blood glucose, blood pressure, and cholesterol levels.  Your activity level.  Other health conditions you have, such as heart or kidney disease. How do carbohydrates affect me? Carbohydrates, also called carbs, affect your blood glucose level more than any other type of food. Eating carbs naturally raises the amount of glucose in your blood. Carb counting is a method for keeping track of how many carbs you eat. Counting carbs is important to keep your blood glucose at a healthy level, especially if you use insulin or take certain oral diabetes medicines. It is important to know how many carbs you can safely have in each meal. This is different for every person. Your dietitian can help you calculate how many carbs you should have at each meal and for each snack. How does alcohol affect me? Alcohol can cause a sudden decrease in blood glucose (hypoglycemia), especially if you use insulin or take certain oral diabetes medicines. Hypoglycemia can be a life-threatening condition. Symptoms of hypoglycemia, such as sleepiness, dizziness, and confusion, are similar to symptoms of having too much  alcohol.  Do not drink alcohol if: ? Your health care provider tells you not to drink. ? You are pregnant, may be pregnant, or are planning to become pregnant.  If you drink alcohol: ? Do not drink on an empty stomach. ? Limit how much you use to:  0-1 drink a day for women.  0-2 drinks a day for men. ? Be aware of how much alcohol is in your drink. In the [place] one drink equals one 12 oz bottle of beer (355 mL), one 5 oz glass of wine (148 mL), or one 1 oz glass of hard liquor (44 mL). ? Keep yourself hydrated with water, diet soda, or unsweetened iced tea.  Keep in mind that regular soda, juice, and other mixers may contain a lot of sugar and must be counted as carbs. What are tips for following this plan? Reading food labels  Start by checking the serving size on the "Nutrition Facts" label of packaged foods and drinks. The amount of calories, carbs, fats, and other nutrients listed on the label is based on one serving of the item. Many items contain more than one serving per package.  Check the total grams (g) of carbs in one serving. You can calculate the number of servings of carbs in one serving by dividing the total carbs by 15. For example, if a food has 30 g of total carbs per serving, it would be equal to 2 servings of carbs.  Check the number of grams (g) of saturated fats and trans fats in one serving. Choose foods that have   a low amount or none of these fats.  Check the number of milligrams (mg) of salt (sodium) in one serving. Most people should limit total sodium intake to less than 2,300 mg per day.  Always check the nutrition information of foods labeled as "low-fat" or "nonfat." These foods may be higher in added sugar or refined carbs and should be avoided.  Talk to your dietitian to identify your daily goals for nutrients listed on the label. Shopping  Avoid buying canned, pre-made, or processed foods. These foods tend to be high in fat, sodium, and added  sugar.  Shop around the outside edge of the grocery store. This is where you will most often find fresh fruits and vegetables, bulk grains, fresh meats, and fresh dairy. Cooking  Use low-heat cooking methods, such as baking, instead of high-heat cooking methods like deep frying.  Cook using healthy oils, such as olive, canola, or sunflower oil.  Avoid cooking with butter, cream, or high-fat meats. Meal planning  Eat meals and snacks regularly, preferably at the same times every day. Avoid going long periods of time without eating.  Eat foods that are high in fiber, such as fresh fruits, vegetables, beans, and whole grains. Talk with your dietitian about how many servings of carbs you can eat at each meal.  Eat 4-6 oz (112-168 g) of lean protein each day, such as lean meat, chicken, fish, eggs, or tofu. One ounce (oz) of lean protein is equal to: ? 1 oz (28 g) of meat, chicken, or fish. ? 1 egg. ?  cup (62 g) of tofu.  Eat some foods each day that contain healthy fats, such as avocado, nuts, seeds, and fish.   What foods should I eat? Fruits Berries. Apples. Oranges. Peaches. Apricots. Plums. Grapes. Mango. Papaya. Pomegranate. Kiwi. Cherries. Vegetables Lettuce. Spinach. Leafy greens, including kale, chard, collard greens, and mustard greens. Beets. Cauliflower. Cabbage. Broccoli. Carrots. Green beans. Tomatoes. Peppers. Onions. Cucumbers. Brussels sprouts. Grains Whole grains, such as whole-wheat or whole-grain bread, crackers, tortillas, cereal, and pasta. Unsweetened oatmeal. Quinoa. Brown or wild rice. Meats and other proteins Seafood. Poultry without skin. Lean cuts of poultry and beef. Tofu. Nuts. Seeds. Dairy Low-fat or fat-free dairy products such as milk, yogurt, and cheese. The items listed above may not be a complete list of foods and beverages you can eat. Contact a dietitian for more information. What foods should I avoid? Fruits Fruits canned with  syrup. Vegetables Canned vegetables. Frozen vegetables with butter or cream sauce. Grains Refined white flour and flour products such as bread, pasta, snack foods, and cereals. Avoid all processed foods. Meats and other proteins Fatty cuts of meat. Poultry with skin. Breaded or fried meats. Processed meat. Avoid saturated fats. Dairy Full-fat yogurt, cheese, or milk. Beverages Sweetened drinks, such as soda or iced tea. The items listed above may not be a complete list of foods and beverages you should avoid. Contact a dietitian for more information. Questions to ask a health care provider  Do I need to meet with a diabetes educator?  Do I need to meet with a dietitian?  What number can I call if I have questions?  When are the best times to check my blood glucose? Where to find more information:  American Diabetes Association: diabetes.org  Academy of Nutrition and Dietetics: www.eatright.org  National Institute of Diabetes and Digestive and Kidney Diseases: www.niddk.nih.gov  Association of Diabetes Care and Education Specialists: www.diabeteseducator.org Summary  It is important to have healthy eating   habits because your blood sugar (glucose) levels are greatly affected by what you eat and drink.  A healthy meal plan will help you control your blood glucose and maintain a healthy lifestyle.  Your health care provider may recommend that you work with a dietitian to make a meal plan that is best for you.  Keep in mind that carbohydrates (carbs) and alcohol have immediate effects on your blood glucose levels. It is important to count carbs and to use alcohol carefully. This information is not intended to replace advice given to you by your health care provider. Make sure you discuss any questions you have with your health care provider. Document Revised: 01/29/2019 Document Reviewed: 01/29/2019 Elsevier Patient Education  2021 Elsevier Inc.  

## 2020-07-03 ENCOUNTER — Other Ambulatory Visit: Payer: Commercial Managed Care - PPO

## 2020-07-03 DIAGNOSIS — E1169 Type 2 diabetes mellitus with other specified complication: Secondary | ICD-10-CM

## 2020-07-03 DIAGNOSIS — E1159 Type 2 diabetes mellitus with other circulatory complications: Secondary | ICD-10-CM

## 2020-07-03 DIAGNOSIS — I152 Hypertension secondary to endocrine disorders: Secondary | ICD-10-CM

## 2020-07-03 DIAGNOSIS — Z1231 Encounter for screening mammogram for malignant neoplasm of breast: Secondary | ICD-10-CM

## 2020-07-03 DIAGNOSIS — E785 Hyperlipidemia, unspecified: Secondary | ICD-10-CM

## 2020-07-03 DIAGNOSIS — K209 Esophagitis, unspecified without bleeding: Secondary | ICD-10-CM

## 2020-07-03 LAB — BAYER DCA HB A1C WAIVED: HB A1C (BAYER DCA - WAIVED): 6.2 % (ref <7.0)

## 2020-07-04 LAB — COMPREHENSIVE METABOLIC PANEL
ALT: 19 IU/L (ref 0–32)
AST: 16 IU/L (ref 0–40)
Albumin/Globulin Ratio: 2.5 — ABNORMAL HIGH (ref 1.2–2.2)
Albumin: 4.7 g/dL (ref 3.8–4.8)
Alkaline Phosphatase: 92 IU/L (ref 44–121)
BUN/Creatinine Ratio: 28 (ref 12–28)
BUN: 19 mg/dL (ref 8–27)
Bilirubin Total: 0.6 mg/dL (ref 0.0–1.2)
CO2: 23 mmol/L (ref 20–29)
Calcium: 9.8 mg/dL (ref 8.7–10.3)
Chloride: 99 mmol/L (ref 96–106)
Creatinine, Ser: 0.68 mg/dL (ref 0.57–1.00)
Globulin, Total: 1.9 g/dL (ref 1.5–4.5)
Glucose: 100 mg/dL — ABNORMAL HIGH (ref 65–99)
Potassium: 4.2 mmol/L (ref 3.5–5.2)
Sodium: 140 mmol/L (ref 134–144)
Total Protein: 6.6 g/dL (ref 6.0–8.5)
eGFR: 98 mL/min/{1.73_m2} (ref >59)

## 2020-07-04 LAB — CBC WITH DIFFERENTIAL/PLATELET
Basophils Absolute: 0 10*3/uL (ref 0.0–0.2)
Basos: 0 %
EOS (ABSOLUTE): 0 10*3/uL (ref 0.0–0.4)
Eos: 1 %
Hematocrit: 45.4 % (ref 34.0–46.6)
Hemoglobin: 15.1 g/dL (ref 11.1–15.9)
Immature Grans (Abs): 0 10*3/uL (ref 0.0–0.1)
Immature Granulocytes: 0 %
Lymphocytes Absolute: 2.8 10*3/uL (ref 0.7–3.1)
Lymphs: 39 %
MCH: 28.2 pg (ref 26.6–33.0)
MCHC: 33.3 g/dL (ref 31.5–35.7)
MCV: 85 fL (ref 79–97)
Monocytes Absolute: 0.5 10*3/uL (ref 0.1–0.9)
Monocytes: 7 %
Neutrophils Absolute: 3.9 10*3/uL (ref 1.4–7.0)
Neutrophils: 53 %
Platelets: 322 10*3/uL (ref 150–450)
RBC: 5.36 x10E6/uL — ABNORMAL HIGH (ref 3.77–5.28)
RDW: 13.2 % (ref 11.7–15.4)
WBC: 7.3 10*3/uL (ref 3.4–10.8)

## 2020-07-04 LAB — LIPID PANEL W/O CHOL/HDL RATIO
Cholesterol, Total: 167 mg/dL (ref 100–199)
HDL: 56 mg/dL (ref >39)
LDL Chol Calc (NIH): 86 mg/dL (ref 0–99)
Triglycerides: 145 mg/dL (ref 0–149)
VLDL Cholesterol Cal: 25 mg/dL (ref 5–40)

## 2020-07-04 LAB — MAGNESIUM: Magnesium: 2.1 mg/dL (ref 1.6–2.3)

## 2020-07-04 LAB — TSH: TSH: 1.79 u[IU]/mL (ref 0.450–4.500)

## 2020-07-04 NOTE — Progress Notes (Signed)
Contacted via MyChart   Good morning April Johnson your labs have returned: - Cholesterol levels show mild increase in LDL this check at 86, previous ranged 45 to 60, would like to see <70 for stroke prevention.  Ensure you are taking Atorvastatin daily and we will recheck next visit, if remains above goal I may increase dosing. - A1c remains at goal 6.2%.  I do continue to recommend decreasing your insulin dosing as instructed and we can always increase other medication doses -- like Farxiga increase to 10 MG or Ozempic has new higher dose coming out -- if needed - CBC shows no anemia - Kidney, creatinine and eGFR, is normal and liver function, AST and ALT, is normal. - Magnesium and thyroid are normal Any questions? Keep being awesome!!  Thank you for allowing me to participate in your care. Kindest regards, Lyndel Sarate

## 2020-08-28 ENCOUNTER — Telehealth: Payer: Self-pay

## 2020-08-28 NOTE — Telephone Encounter (Signed)
Copied from [place] [id number]. Topic: General - Other >> Aug 28, 2020  1:37 PM Pawlus, Brayton Layman A wrote: Reason for CRM: Pts husband passed away yesterday and requested a call from [facility], pts husband was also a pt of [facility].

## 2020-09-22 ENCOUNTER — Other Ambulatory Visit: Payer: Self-pay

## 2020-09-22 ENCOUNTER — Encounter: Payer: Self-pay | Admitting: Nurse Practitioner

## 2020-09-22 ENCOUNTER — Ambulatory Visit: Payer: Commercial Managed Care - PPO | Admitting: Nurse Practitioner

## 2020-09-22 VITALS — BP 128/78 | HR 66 | Temp 98.5°F | Wt 172.2 lb

## 2020-09-22 DIAGNOSIS — E1169 Type 2 diabetes mellitus with other specified complication: Secondary | ICD-10-CM

## 2020-09-22 DIAGNOSIS — E1159 Type 2 diabetes mellitus with other circulatory complications: Secondary | ICD-10-CM

## 2020-09-22 DIAGNOSIS — Z794 Long term (current) use of insulin: Secondary | ICD-10-CM | POA: Diagnosis not present

## 2020-09-22 DIAGNOSIS — F5104 Psychophysiologic insomnia: Secondary | ICD-10-CM | POA: Diagnosis not present

## 2020-09-22 DIAGNOSIS — E785 Hyperlipidemia, unspecified: Secondary | ICD-10-CM

## 2020-09-22 DIAGNOSIS — I152 Hypertension secondary to endocrine disorders: Secondary | ICD-10-CM

## 2020-09-22 DIAGNOSIS — E669 Obesity, unspecified: Secondary | ICD-10-CM

## 2020-09-22 MED ORDER — TRAZODONE HCL 50 MG PO TABS: 25.0000 mg | ORAL_TABLET | Freq: Every evening | ORAL | 3 refills | Status: DC | PRN

## 2020-09-22 NOTE — Progress Notes (Signed)
BP 128/78   Pulse 66   Temp 98.5 F (36.9 C) (Oral)   Wt 172 lb 3.2 oz (78.1 kg)   LMP  (LMP Unknown)   SpO2 98%   BMI 29.56 kg/m    Subjective:    [name], female    DOB: 1955/10/08, 66 y.o.   MRN: [id number]  HPI: April Johnson is a 65 y.o. female  Chief Complaint  Patient presents with   Diabetes    Patient states she has not had a recent Diabetic Eye Exam.    Hypertension   Hyperlipidemia   Sleeping Problem    Patient states she is here to see if April Johnson could prescribed her something to help her sleep as she loss her husband on the 28th of June. Patient states she is having issues with staying asleep. Patient states she will get sleepy and fall asleep and she then wakes up and cannot go back to sleep.    DIABETES Taking Levemir 30 units every other night, sometimes less.  Also taking Ozempic 1MG, Metformin 1000 MG BID, Farxiga 5 MG QDAY. Last A1C was 6.2% and provider continues to recommend reduction of insulin to avoid hypoglycemia.  Reiterated importance of decreasing Levemir by 2 units every 3 days based on morning blood sugar readings.  Takes her Gabapentin twice a day for neuropathy, helps her rest.  Does endorse some nausea the first 2 days after taking Ozempic -- takes Zofran for this with benefit.   Hypoglycemic episodes:no Polydipsia/polyuria: no Visual disturbance: no Chest pain: no Paresthesias: no Glucose Monitoring: yes             Accucheck frequency: Daily             Fasting glucose: 110 to 115 range, occasional <100             Post prandial:             Evening: 130-140 range             Before meals: Taking Insulin?: yes             Long acting insulin: 30 units every other day or less             Short acting insulin: Blood Pressure Monitoring: occasionally Retinal Examination: Not up to Date Foot Exam: Up to Date Pneumovax: Up to Date Influenza: Up to Date Aspirin: yes    HYPERTENSION / HYPERLIPIDEMIA WITH  CKD Continues on Amlodipine, ASA, Lipitor, Metoprolol, and Micardis.   Satisfied with current treatment? yes Duration of hypertension: chronic BP monitoring frequency: checking a few times a week BP range: 130/70 range BP medication side effects: no Duration of hyperlipidemia: chronic Cholesterol medication side effects: no Cholesterol supplements: none Medication compliance: good compliance Aspirin: yes Recent stressors: no Recurrent headaches: no Visual changes: no Palpitations: no Dyspnea: no Chest pain: no Lower extremity edema: no Dizzy/lightheaded: no  INSOMNIA Her husband passed away on the 08-29-22, unexpected loss. She is having trouble sleeping.   Duration: months Satisfied with sleep quality: no Difficulty falling asleep: yes Difficulty staying asleep: yes Waking a few hours after sleep onset: yes Early morning awakenings: yes Daytime hypersomnolence: no Wakes feeling refreshed: no Good sleep hygiene: yes Apnea: no Snoring: no Depressed/anxious mood: yes Recent stress: yes Restless legs/nocturnal leg cramps: no Chronic pain/arthritis: no History of sleep study: no Treatments attempted: none    Relevant past medical, surgical, family and social history reviewed and  updated as indicated. Interim medical history since our last visit reviewed. Allergies and medications reviewed and updated.  Review of Systems  Constitutional:  Negative for activity change, appetite change, diaphoresis, fatigue and fever.  Respiratory:  Negative for cough, chest tightness and shortness of breath.   Cardiovascular:  Negative for chest pain, palpitations and leg swelling.  Gastrointestinal: Negative.   Endocrine: Negative for polydipsia, polyphagia and polyuria.  Neurological: Negative.   Psychiatric/Behavioral: Negative.     Per HPI unless specifically indicated above     Objective:    BP 128/78   Pulse 66   Temp 98.5 F (36.9 C) (Oral)   Wt 172 lb 3.2 oz (78.1  kg)   LMP  (LMP Unknown)   SpO2 98%   BMI 29.56 kg/m   Wt Readings from Last 3 Encounters:  09/22/20 172 lb 3.2 oz (78.1 kg)  07/02/20 175 lb (79.4 kg)  04/13/20 177 lb 9.6 oz (80.6 kg)    Physical Exam Vitals and nursing note reviewed.  Constitutional:      General: She is awake. She is not in acute distress.    Appearance: She is well-developed and well-groomed. She is obese. She is not ill-appearing.  HENT:     Head: Normocephalic.     Right Ear: Hearing normal.     Left Ear: Hearing normal.  Eyes:     General: Lids are normal.        Right eye: No discharge.        Left eye: No discharge.     Conjunctiva/sclera: Conjunctivae normal.     Pupils: Pupils are equal, round, and reactive to light.  Neck:     Thyroid: No thyromegaly.     Vascular: No carotid bruit.  Cardiovascular:     Rate and Rhythm: Normal rate and regular rhythm.     Heart sounds: Normal heart sounds. No murmur heard.   No gallop.  Pulmonary:     Effort: Pulmonary effort is normal.     Breath sounds: Normal breath sounds.  Abdominal:     General: Bowel sounds are normal.     Palpations: Abdomen is soft.  Musculoskeletal:     Cervical back: Normal range of motion and neck supple.     Right lower leg: No edema.     Left lower leg: No edema.  Skin:    General: Skin is warm and dry.  Neurological:     Mental Status: She is alert and oriented to person, place, and time.  Psychiatric:        Attention and Perception: Attention normal.        Mood and Affect: Mood normal.        Speech: Speech normal.        Behavior: Behavior normal. Behavior is cooperative.        Thought Content: Thought content normal.   Results for orders placed or performed in visit on 07/03/20  TSH  Result Value Ref Range   TSH 1.790 0.450 - 4.500 uIU/mL  Magnesium  Result Value Ref Range   Magnesium 2.1 1.6 - 2.3 mg/dL  Lipid Panel w/o Chol/HDL Ratio  Result Value Ref Range   Cholesterol, Total 167 100 - 199 mg/dL    Triglycerides 145 0 - 149 mg/dL   HDL 56 >39 mg/dL   VLDL Cholesterol Cal 25 5 - 40 mg/dL   LDL Chol Calc (NIH) 86 0 - 99 mg/dL  Comprehensive metabolic panel  Result Value Ref Range  Glucose 100 (H) 65 - 99 mg/dL   BUN 19 8 - 27 mg/dL   Creatinine, Ser 0.68 0.57 - 1.00 mg/dL   eGFR 98 >59 mL/min/1.73   BUN/Creatinine Ratio 28 12 - 28   Sodium 140 134 - 144 mmol/L   Potassium 4.2 3.5 - 5.2 mmol/L   Chloride 99 96 - 106 mmol/L   CO2 23 20 - 29 mmol/L   Calcium 9.8 8.7 - 10.3 mg/dL   Total Protein 6.6 6.0 - 8.5 g/dL   Albumin 4.7 3.8 - 4.8 g/dL   Globulin, Total 1.9 1.5 - 4.5 g/dL   Albumin/Globulin Ratio 2.5 (H) 1.2 - 2.2   Bilirubin Total 0.6 0.0 - 1.2 mg/dL   Alkaline Phosphatase 92 44 - 121 IU/L   AST 16 0 - 40 IU/L   ALT 19 0 - 32 IU/L  CBC with Differential/Platelet  Result Value Ref Range   WBC 7.3 3.4 - 10.8 x10E3/uL   RBC 5.36 (H) 3.77 - 5.28 x10E6/uL   Hemoglobin 15.1 11.1 - 15.9 g/dL   Hematocrit 45.4 34.0 - 46.6 %   MCV 85 79 - 97 fL   MCH 28.2 26.6 - 33.0 pg   MCHC 33.3 31.5 - 35.7 g/dL   RDW 13.2 11.7 - 15.4 %   Platelets 322 150 - 450 x10E3/uL   Neutrophils 53 Not Estab. %   Lymphs 39 Not Estab. %   Monocytes 7 Not Estab. %   Eos 1 Not Estab. %   Basos 0 Not Estab. %   Neutrophils Absolute 3.9 1.4 - 7.0 x10E3/uL   Lymphocytes Absolute 2.8 0.7 - 3.1 x10E3/uL   Monocytes Absolute 0.5 0.1 - 0.9 x10E3/uL   EOS (ABSOLUTE) 0.0 0.0 - 0.4 x10E3/uL   Basophils Absolute 0.0 0.0 - 0.2 x10E3/uL   Immature Granulocytes 0 Not Estab. %   Immature Grans (Abs) 0.0 0.0 - 0.1 x10E3/uL  Bayer DCA Hb A1c Waived  Result Value Ref Range   HB A1C (BAYER DCA - WAIVED) 6.2 <7.0 %      Assessment & Plan:   Problem List Items Addressed This Visit       Cardiovascular and Mediastinum   Hypertension associated with diabetes ([place])    Chronic, ongoing with BP at goal today on recheck and on home readings. Continue current medication regimen at this time and adjust as needed.   Telmisartan offering kidney protection.  Recommend she check BP at least 3 times a week at home + focus on DASH diet.  Continue to monitor kidney function and if GFR < 45 consider d/c Farxiga or reduction Metformin.  Obtain BMP today.  Return in 3 months.         Endocrine   Type 2 diabetes mellitus with obesity (HCC) - Primary    Chronic, ongoing with A1c 6.4% today, continues at goal.  Urine ALB 10 and A:C <30 last visit.  Continue Metformin 1000 MG BID + Farxiga and Ozempic, with goal of insulin reduction and possible discontinuation in future.  Reiterated importance of decreasing Levemir by 2 units every 3 days based on morning blood sugar readings. Continue Telmisartan for kidney protection.   Return in 3 months.       Relevant Orders   Bayer DCA Hb A1c Waived   Basic metabolic panel   Hyperlipidemia associated with type 2 diabetes mellitus (HCC)    Chronic, ongoing.  Continue current medication regimen and adjust as needed based on labs. Obtain lipid panel next visit.  Other   Insulin long-term use (HCC)    Continue to recommend reduction of this at home by 2 units every 3 days, with goal to discontinue in future if A1C remains <7.  She is hesitant about this, but highly recommend.       Insomnia    Due to recent loss of husband -- discussed medications, will trial Trazodone 25 to 50 MG at night for sleep, script sent, may increase dose as needed.  This may offer benefit to mood, with grieving, and sleep pattern.  Focus on continue sleep hygiene techniques and return in 6 weeks for follow-up.         Follow up plan: Return in about 6 weeks (around 11/03/2020) for Insomnia.

## 2020-09-22 NOTE — Patient Instructions (Signed)
Complicated Grief Grief is a normal response to the death of someone close to you. Feelings of fear, anger, and guilt can affect almost everyone who loses a loved one. It is also common to have symptoms of depression while you are grieving. These include problems with sleep, loss of appetite, and lack of energy. They maylast for weeks or months after a loss. Complicated grief is different from normal grief or depression. Normal grieving involves sadness and feelings of loss, but those feelings get better and heal over time. Complicated grief is a severe type of grief that lasts for a long time, usually for several months to a year or longer. It interferes with your ability to function normally. Complicated grief may require treatment from [facility] health care provider. What are the causes? The cause of this condition is not known. It is not clear why some peoplecontinue to struggle with grief and others do not. What increases the risk? You are more likely to develop this condition if: The death of your loved one was sudden or unexpected. The death of your loved one was due to a violent event. Your loved one died from suicide. Your loved one was a child or a young person. You were very close to your loved one, or you were dependent on him or her. You have a history of depression or anxiety. What are the signs or symptoms? Symptoms of this condition include: Feeling disbelief or having a lack of emotion (numbness). Being unable to enjoy good memories of your loved one. Needing to avoid anything or anyone that reminds you of your loved one. Being unable to stop thinking about the death. Feeling intense anger or guilt. Feeling alone and hopeless. Feeling that your life is meaningless and empty. Losing the desire to move on with your life. How is this diagnosed? This condition may be diagnosed based on: Your symptoms. Complicated grief will be diagnosed if you have ongoing symptoms of grief for  6-12 months or longer. The effect of symptoms on your life. You may be diagnosed with this condition if your symptoms are interfering with your ability to live your life. Your health care provider may recommend that you see a mental health care provider. Many symptoms of depression are similar to the symptoms of complicated grief. It is important to be evaluated for complicated grief alongwith other mental health conditions. How is this treated? This condition is most commonly treated with talk therapy. This therapy is offered by a mental health specialist (psychiatrist). During therapy: You will learn healthy ways to cope with the loss of your loved one. Your mental health care provider may recommend antidepressant medicines. Follow these instructions at home: Lifestyle  Take care of yourself. Eat on a regular basis, and maintain a healthy diet. Eat plenty of fruits, vegetables, lean protein, and whole grains. Try to get some exercise each day. Aim for 30 minutes of exercise on most days of the week. Keep a consistent sleep schedule. Try to get 8 or more hours of sleep each night. Start doing the things that you used to enjoy. Do not use drugs or alcohol to ease your symptoms. Spend time with friends and loved ones.  General instructions Take over-the-counter and prescription medicines only as told by your health care provider. Consider joining a grief (bereavement) support group to help you deal with your loss. Keep all follow-up visits as told by your health care provider. This is important. Contact a health care provider if: Your symptoms prevent  you from functioning normally. Your symptoms do not get better with treatment. Get help right away if: You have serious thoughts about hurting yourself or someone else. You have suicidal feelings. If you ever feel like you may hurt yourself or others, or have thoughts about taking your own life, get help right away. You can go to your nearest  emergency department or call: Your local emergency services (911 in the [place] A suicide crisis helpline, such as the [facility] at (360) 295-8168. This is open 24 hours a day. Summary Complicated grief is a severe type of grief that lasts for a long time. This grief is not likely to go away on its own. Get the help you need. Some griefs are more difficult than others and can cause this condition. You may need a certain type of treatment to help you recover if the loss of your loved one was sudden, violent, or due to suicide. You may feel guilty about moving on with your life. Getting help does not mean that you are forgetting your loved one. It means that you are taking care of yourself. Complicated grief is best treated with talk therapy. Medicines may also be prescribed. Seek the help you need, and find support that will help you recover. This information is not intended to replace advice given to you by your health care provider. Make sure you discuss any questions you have with your healthcare provider. Document Revised: 08/15/2019 Document Reviewed: 08/15/2019 Elsevier Patient Education  [employer].

## 2020-09-22 NOTE — Assessment & Plan Note (Signed)
Chronic, ongoing.  Continue current medication regimen and adjust as needed based on labs. Obtain lipid panel next visit.

## 2020-09-22 NOTE — Assessment & Plan Note (Signed)
Chronic, ongoing with A1c 6.4% today, continues at goal.  Urine ALB 10 and A:C <30 last visit.  Continue Metformin 1000 MG BID + Farxiga and Ozempic, with goal of insulin reduction and possible discontinuation in future.  Reiterated importance of decreasing Levemir by 2 units every 3 days based on morning blood sugar readings. Continue Telmisartan for kidney protection.   Return in 3 months.

## 2020-09-22 NOTE — Assessment & Plan Note (Signed)
Continue to recommend reduction of this at home by 2 units every 3 days, with goal to discontinue in future if A1C remains <7.  She is hesitant about this, but highly recommend.

## 2020-09-22 NOTE — Assessment & Plan Note (Signed)
Due to recent loss of husband -- discussed medications, will trial Trazodone 25 to 50 MG at night for sleep, script sent, may increase dose as needed.  This may offer benefit to mood, with grieving, and sleep pattern.  Focus on continue sleep hygiene techniques and return in 6 weeks for follow-up.

## 2020-09-22 NOTE — Assessment & Plan Note (Signed)
Chronic, ongoing with BP at goal today on recheck and on home readings. Continue current medication regimen at this time and adjust as needed.  Telmisartan offering kidney protection.  Recommend she check BP at least 3 times a week at home + focus on DASH diet.  Continue to monitor kidney function and if GFR < 45 consider d/c Farxiga or reduction Metformin.  Obtain BMP today.  Return in 3 months.

## 2020-09-23 LAB — BASIC METABOLIC PANEL
BUN/Creatinine Ratio: 22 (ref 12–28)
BUN: 14 mg/dL (ref 8–27)
CO2: 21 mmol/L (ref 20–29)
Calcium: 9.9 mg/dL (ref 8.7–10.3)
Chloride: 105 mmol/L (ref 96–106)
Creatinine, Ser: 0.64 mg/dL (ref 0.57–1.00)
Glucose: 80 mg/dL (ref 65–99)
Potassium: 4.4 mmol/L (ref 3.5–5.2)
Sodium: 143 mmol/L (ref 134–144)
eGFR: 99 mL/min/{1.73_m2} (ref >59)

## 2020-09-23 LAB — BAYER DCA HB A1C WAIVED: HB A1C (BAYER DCA - WAIVED): 6.4 % (ref <7.0)

## 2020-09-23 NOTE — Progress Notes (Signed)
Contacted via [place] morning [place], your labs have returned and continue to look great.  No concerns.  Have a wonderful day!! Keep being awesome!!  Thank you for allowing me to participate in your care.  I appreciate you. Kindest regards, Azyiah Bo

## 2020-10-01 ENCOUNTER — Ambulatory Visit: Payer: Commercial Managed Care - PPO | Admitting: Nurse Practitioner

## 2020-10-15 ENCOUNTER — Other Ambulatory Visit: Payer: Self-pay | Admitting: Nurse Practitioner

## 2020-10-15 NOTE — Telephone Encounter (Signed)
Requested medication (s) are due for refill today: Yes  Requested medication (s) are on the active medication list: Yes  Last refill:  09/22/20  Future visit scheduled: Yes  Notes to clinic:  Requesting 90 day supply, see highlighted from pharmacy     Requested Prescriptions  Pending Prescriptions Disp Refills   traZODone (DESYREL) 50 MG tablet [Pharmacy Med Name: TRAZODONE 50 MG TABLET] 90 tablet 2    Sig: TAKE 0.5-1 TABLETS BY MOUTH AT BEDTIME AS NEEDED FOR SLEEP.     Psychiatry: Antidepressants - Serotonin Modulator Passed - 10/15/2020 12:33 PM      Passed - Valid encounter within last 6 months    Recent Outpatient Visits           3 weeks ago Type 2 diabetes mellitus with obesity ([place])   [place], [name] T, NP   3 months ago Type 2 diabetes mellitus with obesity ([place])   [place], [name] T, NP   6 months ago Type 2 diabetes mellitus with obesity ([place])   [place] Cannady, [name] T, NP   9 months ago Type 2 diabetes mellitus with obesity ([place])   [place], [name] T, NP   1 year ago Type 2 diabetes mellitus with obesity ([place])   [place], [name], NP       Future Appointments             In 2 weeks Cannady, [name], NP [facility]

## 2020-10-16 NOTE — Telephone Encounter (Signed)
Routing to provider to advise if 90 day RX is appropriate.

## 2020-11-03 ENCOUNTER — Ambulatory Visit: Payer: Commercial Managed Care - PPO | Admitting: Nurse Practitioner

## 2020-11-23 ENCOUNTER — Ambulatory Visit: Payer: Commercial Managed Care - PPO | Admitting: Nurse Practitioner

## 2020-11-23 ENCOUNTER — Other Ambulatory Visit: Payer: Self-pay

## 2020-11-23 ENCOUNTER — Encounter: Payer: Self-pay | Admitting: Nurse Practitioner

## 2020-11-23 VITALS — BP 141/78 | HR 60 | Temp 98.2°F | Wt 170.0 lb

## 2020-11-23 DIAGNOSIS — F5104 Psychophysiologic insomnia: Secondary | ICD-10-CM

## 2020-11-23 NOTE — Assessment & Plan Note (Signed)
Due to grieving process, at this time sleep improved with Trazodone.  Will continue current medication regimen and adjust as needed.  At present Trazodone beneficial both to mood and sleep pattern.  Denies any SI/HI.  Recommend she continue current support group sittings.  Return in 2 months.

## 2020-11-23 NOTE — Patient Instructions (Signed)
Insomnia Insomnia is a sleep disorder that makes it difficult to fall asleep or stay asleep. Insomnia can cause fatigue, low energy, difficulty concentrating, moodswings, and poor performance at work or school. There are three different ways to classify insomnia: Difficulty falling asleep. Difficulty staying asleep. Waking up too early in the morning. Any type of insomnia can be long-term (chronic) or short-term (acute). Both are common. Short-term insomnia usually lasts for three months or less. Chronic insomnia occurs at least three times a week for longer than threemonths. What are the causes? Insomnia may be caused by another condition, situation, or substance, such as: Anxiety. Certain medicines. Gastroesophageal reflux disease (GERD) or other gastrointestinal conditions. Asthma or other breathing conditions. Restless legs syndrome, sleep apnea, or other sleep disorders. Chronic pain. Menopause. Stroke. Abuse of alcohol, tobacco, or illegal drugs. Mental health conditions, such as depression. Caffeine. Neurological disorders, such as Alzheimer's disease. An overactive thyroid (hyperthyroidism). Sometimes, the cause of insomnia may not be known. What increases the risk? Risk factors for insomnia include: Gender. Women are affected more often than men. Age. Insomnia is more common as you get older. Stress. Lack of exercise. Irregular work schedule or working night shifts. Traveling between different time zones. Certain medical and mental health conditions. What are the signs or symptoms? If you have insomnia, the main symptom is having trouble falling asleep or having trouble staying asleep. This may lead to other symptoms, such as: Feeling fatigued or having low energy. Feeling nervous about going to sleep. Not feeling rested in the morning. Having trouble concentrating. Feeling irritable, anxious, or depressed. How is this diagnosed? This condition may be diagnosed based  on: Your symptoms and medical history. Your health care provider may ask about: Your sleep habits. Any medical conditions you have. Your mental health. A physical exam. How is this treated? Treatment for insomnia depends on the cause. Treatment may focus on treating an underlying condition that is causing insomnia. Treatment may also include: Medicines to help you sleep. Counseling or therapy. Lifestyle adjustments to help you sleep better. Follow these instructions at home: Eating and drinking  Limit or avoid alcohol, caffeinated beverages, and cigarettes, especially close to bedtime. These can disrupt your sleep. Do not eat a large meal or eat spicy foods right before bedtime. This can lead to digestive discomfort that can make it hard for you to sleep.  Sleep habits  Keep a sleep diary to help you and your health care provider figure out what could be causing your insomnia. Write down: When you sleep. When you wake up during the night. How well you sleep. How rested you feel the next day. Any side effects of medicines you are taking. What you eat and drink. Make your bedroom a dark, comfortable place where it is easy to fall asleep. Put up shades or blackout curtains to block light from outside. Use a white noise machine to block noise. Keep the temperature cool. Limit screen use before bedtime. This includes: Watching TV. Using your smartphone, tablet, or computer. Stick to a routine that includes going to bed and waking up at the same times every day and night. This can help you fall asleep faster. Consider making a quiet activity, such as reading, part of your nighttime routine. Try to avoid taking naps during the day so that you sleep better at night. Get out of bed if you are still awake after 15 minutes of trying to sleep. Keep the lights down, but try reading or doing a quiet   activity. When you feel sleepy, go back to bed.  General instructions Take over-the-counter  and prescription medicines only as told by your health care provider. Exercise regularly, as told by your health care provider. Avoid exercise starting several hours before bedtime. Use relaxation techniques to manage stress. Ask your health care provider to suggest some techniques that may work well for you. These may include: Breathing exercises. Routines to release muscle tension. Visualizing peaceful scenes. Make sure that you drive carefully. Avoid driving if you feel very sleepy. Keep all follow-up visits as told by your health care provider. This is important. Contact a health care provider if: You are tired throughout the day. You have trouble in your daily routine due to sleepiness. You continue to have sleep problems, or your sleep problems get worse. Get help right away if: You have serious thoughts about hurting yourself or someone else. If you ever feel like you may hurt yourself or others, or have thoughts about taking your own life, get help right away. You can go to your nearest emergency department or call: Your local emergency services (911 in the [place] A suicide crisis helpline, such as the National Suicide Prevention Lifeline at 1-800-273-8255. This is open 24 hours a day. Summary Insomnia is a sleep disorder that makes it difficult to fall asleep or stay asleep. Insomnia can be long-term (chronic) or short-term (acute). Treatment for insomnia depends on the cause. Treatment may focus on treating an underlying condition that is causing insomnia. Keep a sleep diary to help you and your health care provider figure out what could be causing your insomnia. This information is not intended to replace advice given to you by your health care provider. Make sure you discuss any questions you have with your healthcare provider. Document Revised: 01/02/2020 Document Reviewed: 01/02/2020 Elsevier Patient Education  2022 [employer] Inc.  

## 2020-11-23 NOTE — Progress Notes (Signed)
BP (!) 141/78   Pulse 60   Temp 98.2 F (36.8 C) (Oral)   Wt 170 lb (77.1 kg)   LMP  (LMP Unknown)   SpO2 98%   BMI 29.18 kg/m    Subjective:    [name], female    DOB: 1955-08-23, 65 y.o.   MRN: [id number]  HPI: April Johnson is a 65 y.o. female  Chief Complaint  Patient presents with   Insomnia    Patient is here for follow up on Insomnia. Patient states during day is when has the feeling of depression and thinking about her husband that passed away during the summer. Patient states the medication helps.    GRIEVING/INSOMNIA She reports the Trazodone ordered last visit is doing well with sleep, but still has some depression during daytime hours.  Does endorse ongoing grieving -- she is in support group with church & a support group on FB. Mood status: stable Satisfied with current treatment?: yes Symptom severity: mild  Duration of current treatment : chronic Side effects: no Medication compliance: good compliance Psychotherapy/counseling: none Previous psychiatric medications: Trazodone Depressed mood: no Anxious mood: no Anhedonia: no Significant weight loss or gain: no Insomnia: none Fatigue: no Feelings of worthlessness or guilt: no Impaired concentration/indecisiveness: no Suicidal ideations: no Hopelessness: no Crying spells: yes Depression screen [facility] 2/9 11/23/2020 04/01/2020 06/03/2019 04/26/2018 02/13/2018  Decreased Interest 0 0 0 0 0  Down, Depressed, Hopeless 0 0 0 0 0  PHQ - 2 Score 0 0 0 0 0  Altered sleeping 1 0 - - 3  Tired, decreased energy 2 0 - - 3  Change in appetite 0 0 - - 3  Feeling bad or failure about yourself  0 0 - - 0  Trouble concentrating 0 0 - - 0  Moving slowly or fidgety/restless 0 0 - - 0  Suicidal thoughts 0 0 - - 0  PHQ-9 Score 3 0 - - 9  Difficult doing work/chores Not difficult at all - - - Not difficult at all     Relevant past medical, surgical, family and social history reviewed and  updated as indicated. Interim medical history since our last visit reviewed. Allergies and medications reviewed and updated.  Review of Systems  Constitutional:  Negative for activity change, appetite change, diaphoresis, fatigue and fever.  Respiratory:  Negative for cough, chest tightness and shortness of breath.   Cardiovascular:  Negative for chest pain, palpitations and leg swelling.  Gastrointestinal: Negative.   Endocrine: Negative for polydipsia, polyphagia and polyuria.  Neurological: Negative.   Psychiatric/Behavioral:  Positive for sleep disturbance. Negative for decreased concentration, self-injury and suicidal ideas.    Per HPI unless specifically indicated above     Objective:    BP (!) 141/78   Pulse 60   Temp 98.2 F (36.8 C) (Oral)   Wt 170 lb (77.1 kg)   LMP  (LMP Unknown)   SpO2 98%   BMI 29.18 kg/m   Wt Readings from Last 3 Encounters:  11/23/20 170 lb (77.1 kg)  09/22/20 172 lb 3.2 oz (78.1 kg)  07/02/20 175 lb (79.4 kg)    Physical Exam Vitals and nursing note reviewed.  Constitutional:      General: She is awake. She is not in acute distress.    Appearance: She is well-developed and well-groomed. She is obese. She is not ill-appearing.  HENT:     Head: Normocephalic.     Right Ear: Hearing normal.  Left Ear: Hearing normal.  Eyes:     General: Lids are normal.        Right eye: No discharge.        Left eye: No discharge.     Conjunctiva/sclera: Conjunctivae normal.     Pupils: Pupils are equal, round, and reactive to light.  Neck:     Thyroid: No thyromegaly.     Vascular: No carotid bruit.  Cardiovascular:     Rate and Rhythm: Normal rate and regular rhythm.     Heart sounds: Normal heart sounds. No murmur heard.   No gallop.  Pulmonary:     Effort: Pulmonary effort is normal.     Breath sounds: Normal breath sounds.  Abdominal:     General: Bowel sounds are normal.     Palpations: Abdomen is soft.  Musculoskeletal:     Cervical  back: Normal range of motion and neck supple.     Right lower leg: No edema.     Left lower leg: No edema.  Skin:    General: Skin is warm and dry.  Neurological:     Mental Status: She is alert and oriented to person, place, and time.  Psychiatric:        Attention and Perception: Attention normal.        Mood and Affect: Mood normal.        Speech: Speech normal.        Behavior: Behavior normal. Behavior is cooperative.        Thought Content: Thought content normal.    Results for orders placed or performed in visit on 09/22/20  Bayer DCA Hb A1c Waived  Result Value Ref Range   HB A1C (BAYER DCA - WAIVED) 6.4 <0.3 %  Basic metabolic panel  Result Value Ref Range   Glucose 80 65 - 99 mg/dL   BUN 14 8 - 27 mg/dL   Creatinine, Ser 0.64 0.57 - 1.00 mg/dL   eGFR 99 >59 mL/min/1.73   BUN/Creatinine Ratio 22 12 - 28   Sodium 143 134 - 144 mmol/L   Potassium 4.4 3.5 - 5.2 mmol/L   Chloride 105 96 - 106 mmol/L   CO2 21 20 - 29 mmol/L   Calcium 9.9 8.7 - 10.3 mg/dL      Assessment & Plan:   Problem List Items Addressed This Visit       Other   Insomnia - Primary    Due to grieving process, at this time sleep improved with Trazodone.  Will continue current medication regimen and adjust as needed.  At present Trazodone beneficial both to mood and sleep pattern.  Denies any SI/HI.  Recommend she continue current support group sittings.  Return in 2 months.        Follow up plan: Return in about 2 months (around 01/23/2021) for T2DM, HTN/HLD, RLS, INSOMNIA.

## 2020-12-16 IMAGING — CR DG TIBIA/FIBULA 2V*L*
3 series · 3 of 3 positions shown · non-contrast
Comparison: None.

CLINICAL DATA: A status post fall.

EXAM:
LEFT TIBIA AND FIBULA - 2 VIEW

[tibia ap]
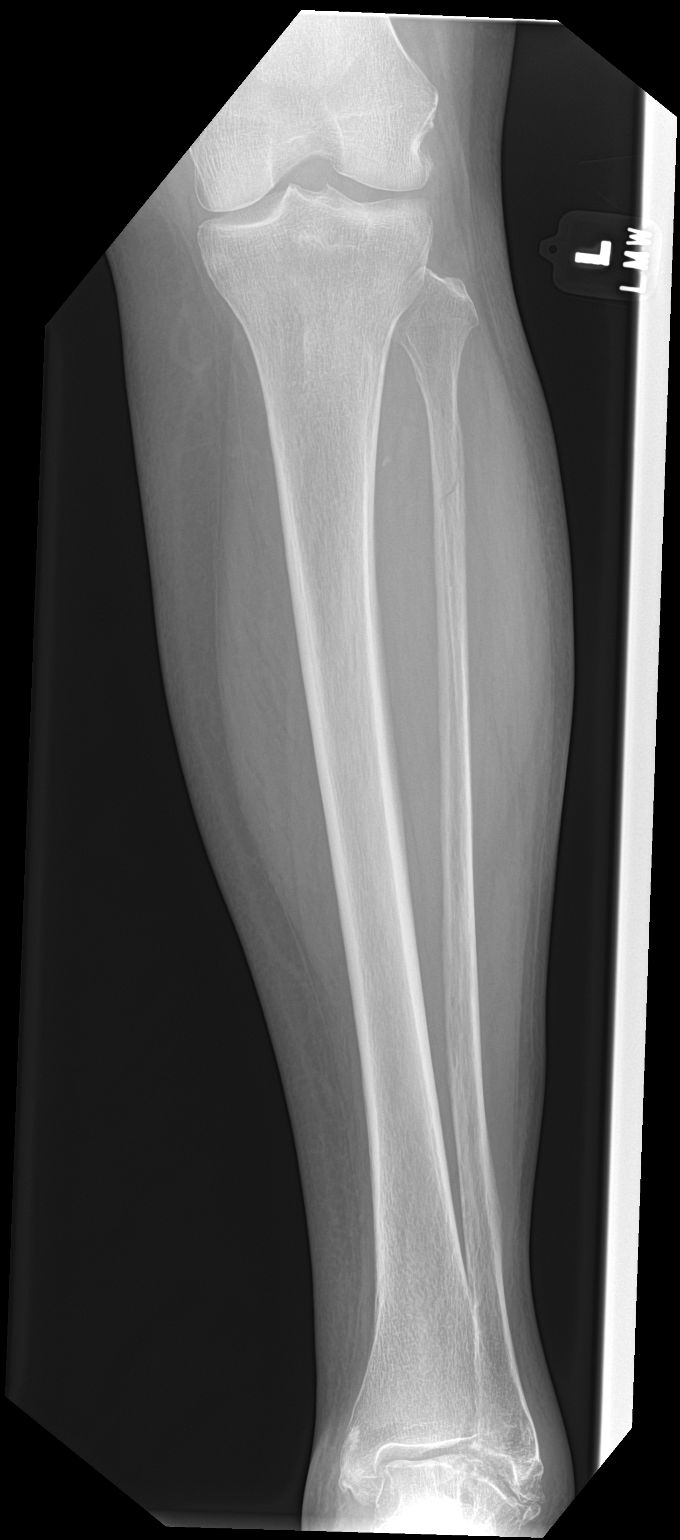

[tibia lat (1 of 2)]
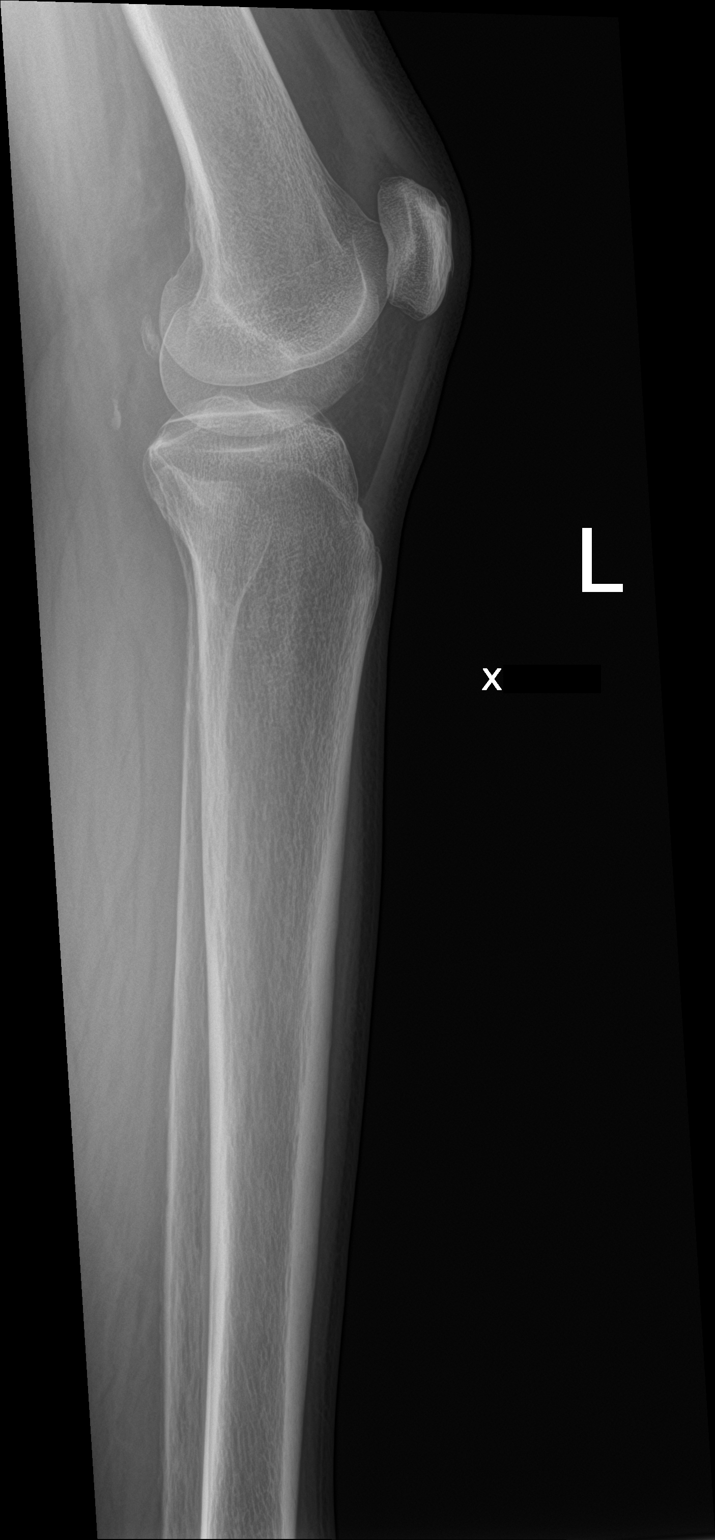

[tibia lat (2 of 2)]
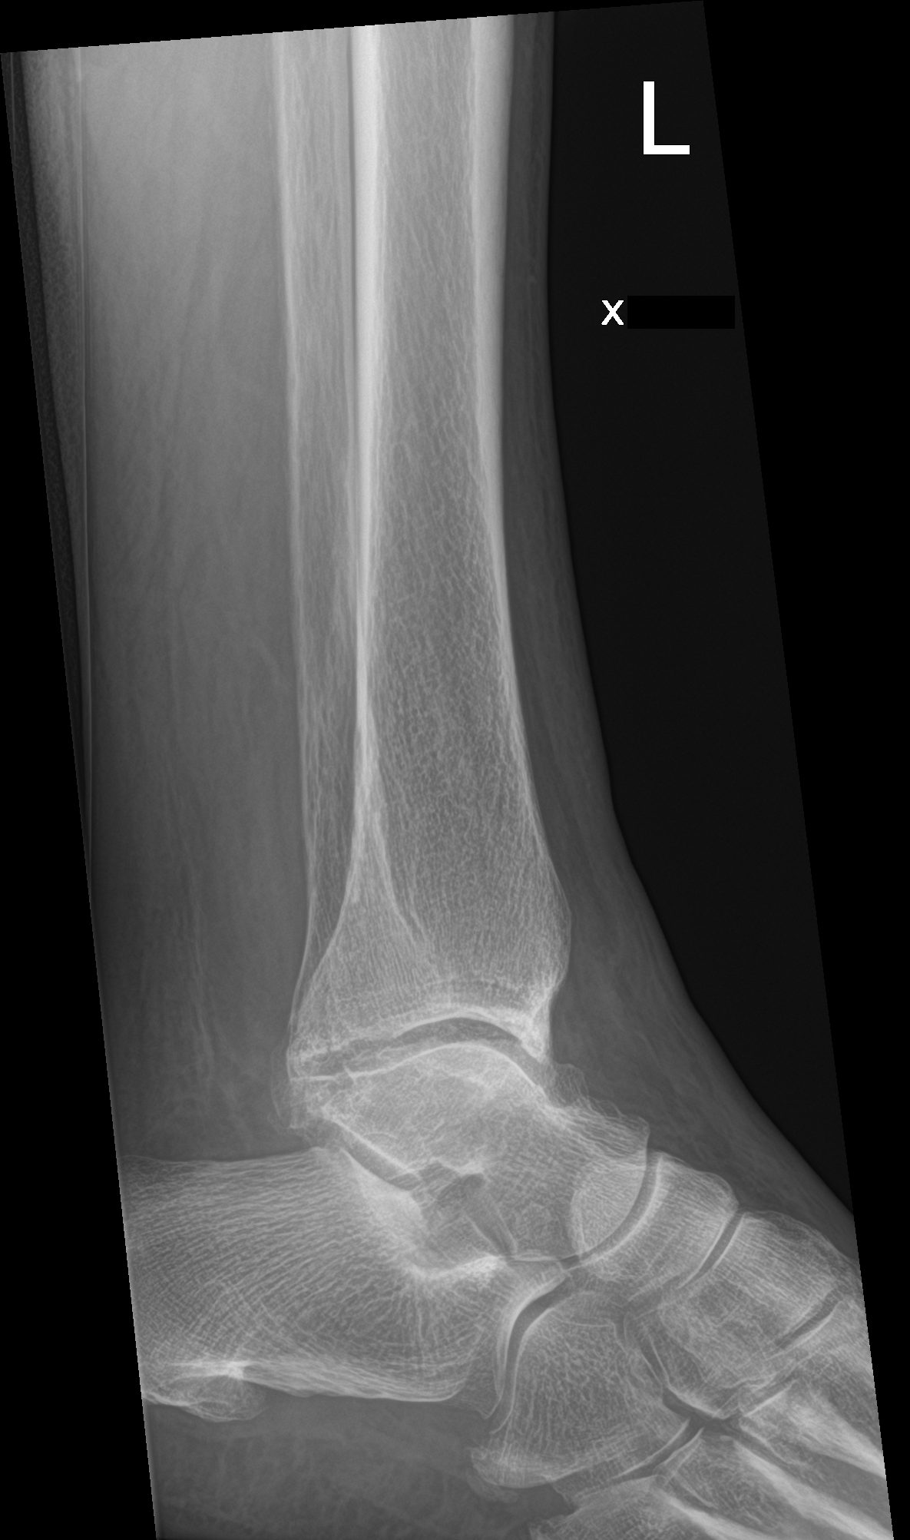

[3 of 3 positions shown; findings below may reference images not displayed]

FINDINGS: Acute nondisplaced fracture is seen involving the proximal shaft of
the left fibula. There is no evidence of dislocation. Soft tissues
are unremarkable.
IMPRESSION: Negative.

## 2021-01-25 ENCOUNTER — Ambulatory Visit: Payer: Commercial Managed Care - PPO | Admitting: Nurse Practitioner

## 2021-04-08 ENCOUNTER — Other Ambulatory Visit: Payer: Self-pay | Admitting: Nurse Practitioner

## 2021-04-08 NOTE — Telephone Encounter (Signed)
Requested medication (s) are due for refill today: Yes  Requested medication (s) are on the active medication list: Yes  Last refill:  04/01/20  Future visit scheduled: No  Notes to clinic:  Prescriptions have expired.    Requested Prescriptions  Pending Prescriptions Disp Refills   gabapentin (NEURONTIN) 300 MG capsule [Pharmacy Med Name: GABAPENTIN 300 MG CAPSULE] 180 capsule 4    Sig: TAKE 1 CAPSULE BY MOUTH TWICE A DAY     Neurology: Anticonvulsants - gabapentin Passed - 04/08/2021 12:42 PM      Passed - Cr in normal range and within 360 days    Creatinine, Ser  Date Value Ref Range Status  09/22/2020 0.64 0.57 - 1.00 mg/dL Final          Passed - Completed PHQ-2 or PHQ-9 in the last 360 days      Passed - Valid encounter within last 12 months    Recent Outpatient Visits           4 months ago Psychophysiological insomnia   [place], [place] T, NP   6 months ago Type 2 diabetes mellitus with obesity ([place])   [place], [name] T, NP   9 months ago Type 2 diabetes mellitus with obesity ([place])   [place], [name] T, NP   1 year ago Type 2 diabetes mellitus with obesity ([place])   [place], [name] T, NP   1 year ago Type 2 diabetes mellitus with obesity ([place])   [place], [name] T, NP               telmisartan (MICARDIS) 80 MG tablet [Pharmacy Med Name: TELMISARTAN 80 MG TABLET] 90 tablet 4    Sig: TAKE 1 TABLET BY MOUTH EVERY DAY     Cardiovascular:  Angiotensin Receptor Blockers Failed - 04/08/2021 12:42 PM      Failed - Cr in normal range and within 180 days    Creatinine, Ser  Date Value Ref Range Status  09/22/2020 0.64 0.57 - 1.00 mg/dL Final          Failed - K in normal range and within 180 days    Potassium  Date Value Ref Range Status  09/22/2020 4.4 3.5 - 5.2 mmol/L Final          Failed - Last BP in normal range    BP Readings from  Last 1 Encounters:  11/23/20 (!) 141/78          Passed - Patient is not pregnant      Passed - Valid encounter within last 6 months    Recent Outpatient Visits           4 months ago Psychophysiological insomnia   [place], [place] T, NP   6 months ago Type 2 diabetes mellitus with obesity ([place])   [place], [name] T, NP   9 months ago Type 2 diabetes mellitus with obesity ([place])   [place], [place] T, NP   1 year ago Type 2 diabetes mellitus with obesity ([place])   [place], [place] T, NP   1 year ago Type 2 diabetes mellitus with obesity ([place])   [place], [name] T, NP               metoprolol succinate (TOPROL-XL) 100 MG 24 hr tablet [Pharmacy Med Name: METOPROLOL SUCC ER 100 MG TAB] 90  tablet 4    Sig: TAKE 1 TABLET BY MOUTH DAILY. TAKE WITH OR IMMEDIATELY FOLLOWING A MEAL.     Cardiovascular:  Beta Blockers Failed - 04/08/2021 12:42 PM      Failed - Last BP in normal range    BP Readings from Last 1 Encounters:  11/23/20 (!) 141/78          Passed - Last Heart Rate in normal range    Pulse Readings from Last 1 Encounters:  11/23/20 60          Passed - Valid encounter within last 6 months    Recent Outpatient Visits           4 months ago Psychophysiological insomnia   [place], [place] T, NP   6 months ago Type 2 diabetes mellitus with obesity ([place])   [place], [name] T, NP   9 months ago Type 2 diabetes mellitus with obesity ([place])   [place], [name] T, NP   1 year ago Type 2 diabetes mellitus with obesity ([place])   [place], [name] T, NP   1 year ago Type 2 diabetes mellitus with obesity ([place])   [place], [name] T, NP               metFORMIN (GLUCOPHAGE) 500 MG tablet [Pharmacy Med Name: METFORMIN HCL 500 MG TABLET] 360  tablet 4    Sig: Take 2 tablets (1,000 mg total) by mouth 2 (two) times daily with a meal.     Endocrinology:  Diabetes - Biguanides Failed - 04/08/2021 12:42 PM      Failed - HBA1C is between 0 and 7.9 and within 180 days    Hemoglobin A1C  Date Value Ref Range Status  10/27/2015 7.3%  Final   HB A1C (BAYER DCA - WAIVED)  Date Value Ref Range Status  09/22/2020 6.4 <7.0 % Final    Comment:                                          Diabetic Adult            <7.0                                       Healthy Adult        4.3 - 5.7                                                           (DCCT/NGSP) American Diabetes Association's Summary of Glycemic Recommendations for Adults with Diabetes: Hemoglobin A1c <7.0%. More stringent glycemic goals (A1c <6.0%) may further reduce complications at the cost of increased risk of hypoglycemia.           Passed - Cr in normal range and within 360 days    Creatinine, Ser  Date Value Ref Range Status  09/22/2020 0.64 0.57 - 1.00 mg/dL Final          Passed - eGFR in normal range and within 360 days    GFR calc Af Wyvonnia Lora  Date Value Ref Range Status  04/01/2020 95 >59 mL/min/1.73 Final    Comment:    **In accordance with recommendations from the NKF-ASN Task force,**   [facility] is in the process of updating its eGFR calculation to the   2021 CKD-EPI creatinine equation that estimates kidney function   without a race variable.    GFR calc non Af Amer  Date Value Ref Range Status  04/01/2020 82 >59 mL/min/1.73 Final   eGFR  Date Value Ref Range Status  09/22/2020 99 >59 mL/min/1.73 Final          Passed - B12 Level in normal range and within 720 days    Vitamin B-12  Date Value Ref Range Status  06/03/2019 471 232 - 1,245 pg/mL Final          Passed - Valid encounter within last 6 months    Recent Outpatient Visits           4 months ago Psychophysiological insomnia   [place], [place] T, NP   6 months  ago Type 2 diabetes mellitus with obesity ([place])   [place], [name] T, NP   9 months ago Type 2 diabetes mellitus with obesity ([place])   [place], [place] T, NP   1 year ago Type 2 diabetes mellitus with obesity ([place])   [place], [name] T, NP   1 year ago Type 2 diabetes mellitus with obesity ([place])   [place], [place] T, NP              Passed - CBC within normal limits and completed in the last 12 months    WBC  Date Value Ref Range Status  07/03/2020 7.3 3.4 - 10.8 x10E3/uL Final  01/12/2019 14.5 (H) 4.0 - 10.5 K/uL Final   RBC  Date Value Ref Range Status  07/03/2020 5.36 (H) 3.77 - 5.28 x10E6/uL Final  01/12/2019 5.30 (H) 3.87 - 5.11 MIL/uL Final   Hemoglobin  Date Value Ref Range Status  07/03/2020 15.1 11.1 - 15.9 g/dL Final   Hematocrit  Date Value Ref Range Status  07/03/2020 45.4 34.0 - 46.6 % Final   MCHC  Date Value Ref Range Status  07/03/2020 33.3 31.5 - 35.7 g/dL Final  01/12/2019 32.0 30.0 - 36.0 g/dL Final   [facility]  Date Value Ref Range Status  07/03/2020 28.2 26.6 - 33.0 pg Final  01/12/2019 26.2 26.0 - 34.0 pg Final   MCV  Date Value Ref Range Status  07/03/2020 85 79 - 97 fL Final   No results found for: PLTCOUNTKUC, LABPLAT, POCPLA RDW  Date Value Ref Range Status  07/03/2020 13.2 11.7 - 15.4 % Final          amLODipine (NORVASC) 5 MG tablet [Pharmacy Med Name: AMLODIPINE BESYLATE 5 MG TAB] 90 tablet 4    Sig: Take 1 tablet (5 mg total) by mouth daily.     Cardiovascular: Calcium Channel Blockers 2 Failed - 04/08/2021 12:42 PM      Failed - Last BP in normal range    BP Readings from Last 1 Encounters:  11/23/20 (!) 141/78          Passed - Last Heart Rate in normal range    Pulse Readings from Last 1 Encounters:  11/23/20 60          Passed - Valid encounter within last 6 months    Recent Outpatient Visits  4 months ago  Psychophysiological insomnia   [place], [place] T, NP   6 months ago Type 2 diabetes mellitus with obesity ([place])   [place], [name] T, NP   9 months ago Type 2 diabetes mellitus with obesity ([place])   [place], [place] T, NP   1 year ago Type 2 diabetes mellitus with obesity ([place])   [place], [place] T, NP   1 year ago Type 2 diabetes mellitus with obesity ([place])   [place], [name], NP

## 2021-04-16 ENCOUNTER — Other Ambulatory Visit: Payer: Self-pay | Admitting: Nurse Practitioner

## 2021-04-16 NOTE — Telephone Encounter (Signed)
Requested medication (s) are due for refill today: YES  Requested medication (s) are on the active medication list: YES  Last refill:  06/05/20 #21/8  Future visit scheduled: yes  Notes to clinic:  Unable to refill per protocol, cannot delegate.      Requested Prescriptions  Pending Prescriptions Disp Refills   ondansetron (ZOFRAN) 4 MG tablet [Pharmacy Med Name: ONDANSETRON HCL 4 MG TABLET] 21 tablet 8    Sig: TAKE 1 TABLET BY MOUTH EVERY 8 HOURS AS NEEDED FOR NAUSEA AND VOMITING     Not Delegated - Gastroenterology: Antiemetics - ondansetron Failed - 04/16/2021  9:05 AM      Failed - This refill cannot be delegated      Passed - AST in normal range and within 360 days    AST  Date Value Ref Range Status  07/03/2020 16 0 - 40 IU/L Final          Passed - ALT in normal range and within 360 days    ALT  Date Value Ref Range Status  07/03/2020 19 0 - 32 IU/L Final          Passed - Valid encounter within last 6 months    Recent Outpatient Visits           4 months ago Psychophysiological insomnia   [place], [place] T, NP   6 months ago Type 2 diabetes mellitus with obesity ([place])   [place], [name] T, NP   9 months ago Type 2 diabetes mellitus with obesity ([place])   [place], [place] T, NP   1 year ago Type 2 diabetes mellitus with obesity ([place])   [place], [name] T, NP   1 year ago Type 2 diabetes mellitus with obesity ([place])   [place], [name], NP       Future Appointments             In 3 days [place], [name], NP [employer]

## 2021-04-18 NOTE — Patient Instructions (Signed)

## 2021-04-19 ENCOUNTER — Encounter: Payer: Self-pay | Admitting: Nurse Practitioner

## 2021-04-19 ENCOUNTER — Other Ambulatory Visit: Payer: Self-pay

## 2021-04-19 ENCOUNTER — Ambulatory Visit (INDEPENDENT_AMBULATORY_CARE_PROVIDER_SITE_OTHER): Payer: Self-pay | Admitting: Nurse Practitioner

## 2021-04-19 VITALS — BP 130/68 | HR 61 | Temp 97.9°F | Ht 64.0 in | Wt 182.8 lb

## 2021-04-19 DIAGNOSIS — E785 Hyperlipidemia, unspecified: Secondary | ICD-10-CM

## 2021-04-19 DIAGNOSIS — E1142 Type 2 diabetes mellitus with diabetic polyneuropathy: Secondary | ICD-10-CM

## 2021-04-19 DIAGNOSIS — Z794 Long term (current) use of insulin: Secondary | ICD-10-CM

## 2021-04-19 DIAGNOSIS — E559 Vitamin D deficiency, unspecified: Secondary | ICD-10-CM

## 2021-04-19 DIAGNOSIS — E669 Obesity, unspecified: Secondary | ICD-10-CM

## 2021-04-19 DIAGNOSIS — F5104 Psychophysiologic insomnia: Secondary | ICD-10-CM

## 2021-04-19 DIAGNOSIS — K209 Esophagitis, unspecified without bleeding: Secondary | ICD-10-CM

## 2021-04-19 DIAGNOSIS — E1159 Type 2 diabetes mellitus with other circulatory complications: Secondary | ICD-10-CM

## 2021-04-19 DIAGNOSIS — E1169 Type 2 diabetes mellitus with other specified complication: Secondary | ICD-10-CM

## 2021-04-19 DIAGNOSIS — I152 Hypertension secondary to endocrine disorders: Secondary | ICD-10-CM

## 2021-04-19 LAB — MICROALBUMIN, URINE WAIVED
Creatinine, Urine Waived: 100 mg/dL (ref 10–300)
Microalb, Ur Waived: 10 mg/L (ref 0–19)
Microalb/Creat Ratio: <30 mg/g (ref <30)

## 2021-04-19 LAB — BAYER DCA HB A1C WAIVED: HB A1C (BAYER DCA - WAIVED): 7.6 % — ABNORMAL HIGH (ref 4.8–5.6)

## 2021-04-19 MED ORDER — MELOXICAM 7.5 MG PO TABS: 7.5000 mg | ORAL_TABLET | Freq: Every day | ORAL | 4 refills | Status: DC

## 2021-04-19 MED ORDER — EMBRACE BLOOD GLUCOSE MONITOR DEVI: 0 refills | Status: DC

## 2021-04-19 MED ORDER — GABAPENTIN 300 MG PO CAPS: 300.0000 mg | ORAL_CAPSULE | Freq: Two times a day (BID) | ORAL | 4 refills | Status: DC

## 2021-04-19 NOTE — Assessment & Plan Note (Addendum)
Chronic, ongoing with A1c 7.6%, trend up due to diet indiscretions.  Continue Metformin 1000 MG BID + Ozempic, with goal of insulin reduction and possible discontinuation in future.  Continue current insulin dosing at this time due to increase in A1c -- would benefit having Farxiga back on board in future, may need assistance.  Continue Gabapentin for neuropathy.  B12 level at goal.  Refills sent in.  Return in 3 months.

## 2021-04-19 NOTE — Assessment & Plan Note (Signed)
Chronic, ongoing.  Continue current medication regimen and adjust as needed based on labs. Obtain lipid panel today.

## 2021-04-19 NOTE — Assessment & Plan Note (Signed)
Chronic, stable with no symptoms with Prilosec on board.  Continue current medication regimen and adjust as needed.  Mag level today.

## 2021-04-19 NOTE — Assessment & Plan Note (Signed)
Due to grieving process, at this time sleep improved with Trazodone.  Will continue current medication regimen and adjust as needed.  At present Trazodone beneficial both to mood and sleep pattern.  Denies any SI/HI.  Recommend she continue current support group sittings.  Return in 3 months.

## 2021-04-19 NOTE — Assessment & Plan Note (Signed)
Refer to diabetes plan of care. 

## 2021-04-19 NOTE — Progress Notes (Addendum)
BP 130/68 (BP Location: Left Arm, Patient Position: Sitting, Cuff Size: Normal)    Pulse 61    Temp 97.9 F (36.6 C) (Oral)    Ht 5\' 4"  (1.626 m)    Wt 182 lb 12.8 oz (82.9 kg)    LMP  (LMP Unknown)    SpO2 97%    BMI 31.38 kg/m    Subjective:    [name], female    DOB: [birth year], 66 y.o.   MRN: [id number]  HPI: April Johnson is a 66 y.o. female  Chief Complaint  Patient presents with   Diabetes    Patient states she needs an order for a new glucose meter. Patient states she is currently waiting for her insurance to kick in for her to have a recent diabetic eye exam.    Hyperlipidemia   Hypertension   Peripheral Neuropathy   Mood    Patient states since she started the Anxiety medication, she has noticed a difference in her mood. Patient states she is doing well.    DIABETES Follow-up for diabetes.  Taking Levemir 30 units every other night, sometimes less.  Also taking Ozempic 1MG , Metformin 1000 MG BID.  Stopped taking Wilder Glade due to cost, $536 for 3 month supply.  Last A1c was 6.4% and provider continues to recommend reduction of insulin to avoid hypoglycemia.  Reiterated importance of decreasing Levemir by 2 units every 3 days based on morning blood sugar readings.  Takes her Gabapentin twice a day for neuropathy + takes occasional Meloxicam.  Does endorse poor diet over past months.    Does endorse some nausea the first 2 days after taking Ozempic -- takes Zofran for this with benefit.   Hypoglycemic episodes:no Polydipsia/polyuria: no Visual disturbance: no Chest pain: no Paresthesias: no Glucose Monitoring: yes             Accucheck frequency: meter is broken -- she would like same one she had ordered and will contact provider with information             Fasting glucose:              Post prandial:             Evening:              Before meals: Taking Insulin?: yes             Long acting insulin: 30 units every other day              Short acting insulin: Blood Pressure Monitoring: occasionally Retinal Examination: Not up to Date -- waiting on insurance Foot Exam: Up to Date Pneumovax: Up to Date Influenza: Up to Date Aspirin: yes    HYPERTENSION / HYPERLIPIDEMIA WITH CKD Continues on Amlodipine, ASA, Lipitor, Metoprolol, and Micardis.    Continues on Omeprazole for GERD which offers benefit. Satisfied with current treatment? yes Duration of hypertension: chronic BP monitoring frequency: checking a few times a week BP range: 130/70 range BP medication side effects: no Duration of hyperlipidemia: chronic Cholesterol medication side effects: no Cholesterol supplements: none Medication compliance: good compliance Aspirin: yes Recent stressors: no Recurrent headaches: no Visual changes: no Palpitations: no Dyspnea: no Chest pain: no Lower extremity edema: no Dizzy/lightheaded: no  INSOMNIA Continues on Trazodone since loss of her husband. Duration: months Satisfied with sleep quality: no Difficulty falling asleep: none Difficulty staying asleep: none Waking a few hours after sleep onset: none Early morning awakenings: none  Daytime hypersomnolence: no Wakes feeling refreshed: no Good sleep hygiene: yes Apnea: no Snoring: no Depressed/anxious mood: yes Recent stress: yes Restless legs/nocturnal leg cramps: no Chronic pain/arthritis: no History of sleep study: no Treatments attempted: Trazodone Depression screen [facility] 2/9 04/19/2021 11/23/2020 04/01/2020 06/03/2019 04/26/2018  Decreased Interest 0 0 0 0 0  Down, Depressed, Hopeless 1 0 0 0 0  PHQ - 2 Score 1 0 0 0 0  Altered sleeping 0 1 0 - -  Tired, decreased energy 1 2 0 - -  Change in appetite 0 0 0 - -  Feeling bad or failure about yourself  0 0 0 - -  Trouble concentrating 1 0 0 - -  Moving slowly or fidgety/restless 0 0 0 - -  Suicidal thoughts 0 0 0 - -  PHQ-9 Score 3 3 0 - -  Difficult doing work/chores Not difficult at all Not difficult  at all - - -    GAD 7 : Generalized Anxiety Score 04/19/2021  Nervous, Anxious, on Edge 1  Control/stop worrying 0  Worry too much - different things 1  Trouble relaxing 0  Restless 0  Easily annoyed or irritable 1  Afraid - awful might happen 0  Total GAD 7 Score 3  Anxiety Difficulty Not difficult at all    Relevant past medical, surgical, family and social history reviewed and updated as indicated. Interim medical history since our last visit reviewed. Allergies and medications reviewed and updated.  Review of Systems  Constitutional:  Negative for activity change, appetite change, diaphoresis, fatigue and fever.  Respiratory:  Negative for cough, chest tightness and shortness of breath.   Cardiovascular:  Negative for chest pain, palpitations and leg swelling.  Gastrointestinal: Negative.   Endocrine: Negative for polydipsia, polyphagia and polyuria.  Neurological: Negative.   Psychiatric/Behavioral: Negative.     Per HPI unless specifically indicated above     Objective:    BP 130/68 (BP Location: Left Arm, Patient Position: Sitting, Cuff Size: Normal)    Pulse 61    Temp 97.9 F (36.6 C) (Oral)    Ht 5\' 4"  (1.626 m)    Wt 182 lb 12.8 oz (82.9 kg)    LMP  (LMP Unknown)    SpO2 97%    BMI 31.38 kg/m   Wt Readings from Last 3 Encounters:  04/19/21 182 lb 12.8 oz (82.9 kg)  11/23/20 170 lb (77.1 kg)  09/22/20 172 lb 3.2 oz (78.1 kg)    Physical Exam Vitals and nursing note reviewed.  Constitutional:      General: She is awake. She is not in acute distress.    Appearance: She is well-developed and well-groomed. She is obese. She is not ill-appearing.  HENT:     Head: Normocephalic.     Right Ear: Hearing normal.     Left Ear: Hearing normal.  Eyes:     General: Lids are normal.        Right eye: No discharge.        Left eye: No discharge.     Conjunctiva/sclera: Conjunctivae normal.     Pupils: Pupils are equal, round, and reactive to light.  Neck:     Thyroid:  No thyromegaly.     Vascular: No carotid bruit.  Cardiovascular:     Rate and Rhythm: Normal rate and regular rhythm.     Heart sounds: Normal heart sounds. No murmur heard.   No gallop.  Pulmonary:     Effort: Pulmonary effort is  normal.     Breath sounds: Normal breath sounds.  Abdominal:     General: Bowel sounds are normal.     Palpations: Abdomen is soft.  Musculoskeletal:     Cervical back: Normal range of motion and neck supple.     Right lower leg: No edema.     Left lower leg: No edema.  Skin:    General: Skin is warm and dry.  Neurological:     Mental Status: She is alert and oriented to person, place, and time.  Psychiatric:        Attention and Perception: Attention normal.        Mood and Affect: Mood normal.        Speech: Speech normal.        Behavior: Behavior normal. Behavior is cooperative.        Thought Content: Thought content normal.   Diabetic Foot Exam - Simple   Simple Foot Form Visual Inspection No deformities, no ulcerations, no other skin breakdown bilaterally: Yes Sensation Testing Intact to touch and monofilament testing bilaterally: Yes Pulse Check Posterior Tibialis and Dorsalis pulse intact bilaterally: Yes Comments     Results for orders placed or performed in visit on 04/19/21  Bayer DCA Hb A1c Waived  Result Value Ref Range   HB A1C (BAYER DCA - WAIVED) 7.6 (H) 4.8 - 5.6 %  Microalbumin, Urine Waived  Result Value Ref Range   Microalb, Ur Waived 10 0 - 19 mg/L   Creatinine, Urine Waived 100 10 - 300 mg/dL   Microalb/Creat Ratio <30 <30 mg/g      Assessment & Plan:   Problem List Items Addressed This Visit       Cardiovascular and Mediastinum   Hypertension associated with diabetes ([place])    Chronic, ongoing with BP at goal today on recheck and on home readings. Continue current medication regimen at this time and adjust as needed.  Telmisartan offering kidney protection.  Recommend she check BP at least 3 times a week at home  + focus on DASH diet.  Continue to monitor kidney function and if GFR < 45 consider reduction Metformin.  Obtain CBC, CMP, TSH, urine ALB today.  Return in 3 months.      Relevant Orders   Bayer DCA Hb A1c Waived (Completed)   Microalbumin, Urine Waived (Completed)   CBC with Differential/Platelet   Comprehensive metabolic panel   TSH     Digestive   Esophagitis    Chronic, stable with no symptoms with Prilosec on board.  Continue current medication regimen and adjust as needed.  Mag level today.      Relevant Orders   Magnesium     Endocrine   Diabetic peripheral neuropathy (HCC)    Chronic, ongoing with A1c 7.6%, trend up due to diet indiscretions.  Continue Metformin 1000 MG BID + Ozempic, with goal of insulin reduction and possible discontinuation in future.  Continue current insulin dosing at this time due to increase in A1c -- would benefit having Farxiga back on board in future, may need assistance.  Continue Gabapentin for neuropathy.  B12 level at goal.  Refills sent in.  Return in 3 months.      Relevant Medications   gabapentin (NEURONTIN) 300 MG capsule   Other Relevant Orders   Bayer DCA Hb A1c Waived (Completed)   Microalbumin, Urine Waived (Completed)   Hyperlipidemia associated with type 2 diabetes mellitus (HCC)    Chronic, ongoing.  Continue current medication regimen  and adjust as needed based on labs. Obtain lipid panel today.      Relevant Orders   Bayer DCA Hb A1c Waived (Completed)   Comprehensive metabolic panel   Lipid Panel w/o Chol/HDL Ratio   Type 2 diabetes mellitus with obesity (HCC) - Primary    Chronic, ongoing with A1c 7.6% today, trend up due to diet changes.  Urine ALB 10 and A:C <30 today (February 2023).  Continue Metformin 1000 MG BID + Ozempic, with goal of insulin reduction and possible discontinuation in future.  Continue current insulin dosing at this time due to increase in A1c -- would benefit having Farxiga back on board in future, may  need assistance. Continue Telmisartan for kidney protection.   Return in 3 months.      Relevant Orders   Bayer DCA Hb A1c Waived (Completed)   Microalbumin, Urine Waived (Completed)     Other   Insomnia    Due to grieving process, at this time sleep improved with Trazodone.  Will continue current medication regimen and adjust as needed.  At present Trazodone beneficial both to mood and sleep pattern.  Denies any SI/HI.  Recommend she continue current support group sittings.  Return in 3 months.      Insulin long-term use ([place])    Refer to diabetes plan of care.      Relevant Orders   Bayer DCA Hb A1c Waived (Completed)   Other Visit Diagnoses     Vitamin D deficiency       History of low levels reported, check today and start supplement as needed.   Relevant Orders   VITAMIN D 25 Hydroxy (Vit-D Deficiency, Fractures)        Follow up plan: Return in about 3 months (around 07/17/2021) for T2DM, HTN/HLD, INSOMNIA.

## 2021-04-19 NOTE — Assessment & Plan Note (Signed)
Chronic, ongoing with BP at goal today on recheck and on home readings. Continue current medication regimen at this time and adjust as needed.  Telmisartan offering kidney protection.  Recommend she check BP at least 3 times a week at home + focus on DASH diet.  Continue to monitor kidney function and if GFR < 45 consider reduction Metformin.  Obtain CBC, CMP, TSH, urine ALB today.  Return in 3 months.

## 2021-04-19 NOTE — Assessment & Plan Note (Signed)
Chronic, ongoing with A1c 7.6% today, trend up due to diet changes.  Urine ALB 10 and A:C <30 today (February 2023).  Continue Metformin 1000 MG BID + Ozempic, with goal of insulin reduction and possible discontinuation in future.  Continue current insulin dosing at this time due to increase in A1c -- would benefit having Farxiga back on board in future, may need assistance. Continue Telmisartan for kidney protection.   Return in 3 months.

## 2021-04-20 ENCOUNTER — Encounter: Payer: Self-pay | Admitting: Nurse Practitioner

## 2021-04-20 ENCOUNTER — Other Ambulatory Visit: Payer: Self-pay

## 2021-04-20 DIAGNOSIS — E559 Vitamin D deficiency, unspecified: Secondary | ICD-10-CM | POA: Insufficient documentation

## 2021-04-20 LAB — COMPREHENSIVE METABOLIC PANEL
ALT: 22 IU/L (ref 0–32)
AST: 16 IU/L (ref 0–40)
Albumin/Globulin Ratio: 1.9 (ref 1.2–2.2)
Albumin: 4.5 g/dL (ref 3.8–4.8)
Alkaline Phosphatase: 96 IU/L (ref 44–121)
BUN/Creatinine Ratio: 27 (ref 12–28)
BUN: 21 mg/dL (ref 8–27)
Bilirubin Total: 0.5 mg/dL (ref 0.0–1.2)
CO2: 23 mmol/L (ref 20–29)
Calcium: 9.6 mg/dL (ref 8.7–10.3)
Chloride: 102 mmol/L (ref 96–106)
Creatinine, Ser: 0.78 mg/dL (ref 0.57–1.00)
Globulin, Total: 2.4 g/dL (ref 1.5–4.5)
Glucose: 122 mg/dL — ABNORMAL HIGH (ref 70–99)
Potassium: 4.5 mmol/L (ref 3.5–5.2)
Sodium: 139 mmol/L (ref 134–144)
Total Protein: 6.9 g/dL (ref 6.0–8.5)
eGFR: 85 mL/min/{1.73_m2} (ref >59)

## 2021-04-20 LAB — CBC WITH DIFFERENTIAL/PLATELET
Basophils Absolute: 0 10*3/uL (ref 0.0–0.2)
Basos: 0 %
EOS (ABSOLUTE): 0 10*3/uL (ref 0.0–0.4)
Eos: 1 %
Hematocrit: 43.3 % (ref 34.0–46.6)
Hemoglobin: 14.2 g/dL (ref 11.1–15.9)
Immature Grans (Abs): 0 10*3/uL (ref 0.0–0.1)
Immature Granulocytes: 1 %
Lymphocytes Absolute: 2.7 10*3/uL (ref 0.7–3.1)
Lymphs: 39 %
MCH: 26 pg — ABNORMAL LOW (ref 26.6–33.0)
MCHC: 32.8 g/dL (ref 31.5–35.7)
MCV: 79 fL (ref 79–97)
Monocytes Absolute: 0.4 10*3/uL (ref 0.1–0.9)
Monocytes: 6 %
Neutrophils Absolute: 3.7 10*3/uL (ref 1.4–7.0)
Neutrophils: 53 %
Platelets: 291 10*3/uL (ref 150–450)
RBC: 5.47 x10E6/uL — ABNORMAL HIGH (ref 3.77–5.28)
RDW: 14.6 % (ref 11.7–15.4)
WBC: 6.9 10*3/uL (ref 3.4–10.8)

## 2021-04-20 LAB — LIPID PANEL W/O CHOL/HDL RATIO
Cholesterol, Total: 221 mg/dL — ABNORMAL HIGH (ref 100–199)
HDL: 47 mg/dL (ref >39)
LDL Chol Calc (NIH): 115 mg/dL — ABNORMAL HIGH (ref 0–99)
Triglycerides: 343 mg/dL — ABNORMAL HIGH (ref 0–149)
VLDL Cholesterol Cal: 59 mg/dL — ABNORMAL HIGH (ref 5–40)

## 2021-04-20 LAB — VITAMIN D 25 HYDROXY (VIT D DEFICIENCY, FRACTURES): Vit D, 25-Hydroxy: 22.3 ng/mL — ABNORMAL LOW (ref 30.0–100.0)

## 2021-04-20 LAB — TSH: TSH: 2.13 u[IU]/mL (ref 0.450–4.500)

## 2021-04-20 LAB — MAGNESIUM: Magnesium: 2.1 mg/dL (ref 1.6–2.3)

## 2021-04-20 MED ORDER — ONETOUCH VERIO W/DEVICE KIT
PACK | 1 refills | Status: DC
Start: 2021-04-20 — End: 2022-07-12

## 2021-04-20 MED ORDER — ONETOUCH ULTRASOFT LANCETS MISC: 12 refills | Status: AC

## 2021-04-20 NOTE — Progress Notes (Signed)
Contacted via [place] afternoon April Johnson, your labs have returned: - Kidney function, creatinine and eGFR, remains normal, as is liver function, AST and ALT.   - CBC shows no anemia or infection. - Cholesterol labs are quite elevated from your previous levels -- are you taking Atorvastatin daily?  If not please ensure you do take this every day and we will recheck fasting next visit. - Vitamin D level a little on low side, please start taking Vitamin D3 2000 units daily which you can obtain in vitamin section -- this is good for bone health. - Magnesium and thyroid levels stable.  Any questions? Keep being amazing!!  Thank you for allowing me to participate in your care.  I appreciate you. Kindest regards, Kyro Joswick

## 2021-05-29 ENCOUNTER — Other Ambulatory Visit: Payer: Self-pay | Admitting: Nurse Practitioner

## 2021-06-01 NOTE — Telephone Encounter (Signed)
Refilled 10/16/2020 #90 2 refills - enough to last until May at the higher dose. ?Requested Prescriptions  ?Pending Prescriptions Disp Refills  ?? traZODone (DESYREL) 50 MG tablet [Pharmacy Med Name: TRAZODONE 50 MG TABLET] 90 tablet 2  ?  Sig: TAKE 1/2 TO 1 TABLET BY MOUTH AT BEDTIME AS NEEDED FOR SLEEP  ?  ? Psychiatry: Antidepressants - Serotonin Modulator Passed - 05/29/2021 11:38 AM  ?  ?  Passed - Valid encounter within last 6 months  ?  Recent Outpatient Visits   ?      ? 1 month ago Type 2 diabetes mellitus with obesity ([place])  ? [place], [place] T, NP  ? 6 months ago Psychophysiological insomnia  ? [place], [place] T, NP  ? 8 months ago Type 2 diabetes mellitus with obesity ([place])  ? [place], [place] T, NP  ? 11 months ago Type 2 diabetes mellitus with obesity ([place])  ? [place], [place] T, NP  ? 1 year ago Type 2 diabetes mellitus with obesity ([place])  ? [facility] [place], Henrine Screws T, NP  ?  ?  ?Future Appointments   ?        ? In 1 month Cannady, [name], NP [facility]  ?  ? ?  ?  ?  ? ?

## 2021-06-17 ENCOUNTER — Other Ambulatory Visit: Payer: Self-pay | Admitting: Nurse Practitioner

## 2021-06-18 NOTE — Telephone Encounter (Signed)
Requested Prescriptions  ?Pending Prescriptions Disp Refills  ?? traZODone (DESYREL) 50 MG tablet [Pharmacy Med Name: TRAZODONE 50 MG TABLET] 90 tablet 0  ?  Sig: TAKE 1/2 TO 1 TABLET BY MOUTH AT BEDTIME AS NEEDED FOR SLEEP  ?  ? Psychiatry: Antidepressants - Serotonin Modulator Passed - 06/17/2021  7:14 PM  ?  ?  Passed - Valid encounter within last 6 months  ?  Recent Outpatient Visits   ?      ? 2 months ago Type 2 diabetes mellitus with obesity ([place])  ? [place], [place] T, NP  ? 6 months ago Psychophysiological insomnia  ? [place], [place] T, NP  ? 8 months ago Type 2 diabetes mellitus with obesity ([place])  ? [place], [place] T, NP  ? 11 months ago Type 2 diabetes mellitus with obesity ([place])  ? [place], [place] T, NP  ? 1 year ago Type 2 diabetes mellitus with obesity ([place])  ? [facility] [place], Henrine Screws T, NP  ?  ?  ?Future Appointments   ?        ? In 1 month Cannady, [name], NP [facility]  ?  ? ?  ?  ?  ? ? ? ?

## 2021-06-20 ENCOUNTER — Other Ambulatory Visit: Payer: Self-pay | Admitting: Nurse Practitioner

## 2021-06-21 MED ORDER — LEVEMIR FLEXPEN 100 UNIT/ML ~~LOC~~ SOPN: 60.0000 [IU] | PEN_INJECTOR | Freq: Every day | SUBCUTANEOUS | 11 refills | Status: DC

## 2021-06-21 NOTE — Telephone Encounter (Signed)
See message below from the pharmacy.  ?

## 2021-06-21 NOTE — Telephone Encounter (Signed)
Requested medication (s) are due for refill today: no ? ?Requested medication (s) are on the active medication list: yes ? ?Last refill:  06/20/21 ? ?Future visit scheduled: yes ? ?Notes to clinic:  Alternative Requested:LEVEMIR FLEXTOUCH NOT AVAILABLE ANYMORE. PLEASE SEND NEW RX FOR LEVEMIR FLEXPEN. ?  ?Requested Prescriptions  ?Pending Prescriptions Disp Refills  ? TRESIBA FLEXTOUCH 100 UNIT/ML FlexTouch Pen [Pharmacy Med Name: TRESIBA FLEXTOUCH 100 UNIT/ML]  0  ?  ? Endocrinology:  Diabetes - Insulins Passed - 06/20/2021 11:01 AM  ?  ?  Passed - HBA1C is between 0 and 7.9 and within 180 days  ?  Hemoglobin A1C  ?Date Value Ref Range Status  ?10/27/2015 7.3%  Final  ? ?HB A1C (BAYER DCA - WAIVED)  ?Date Value Ref Range Status  ?04/19/2021 7.6 (H) 4.8 - 5.6 % Final  ?  Comment:  ?           Prediabetes: 5.7 - 6.4 ?         Diabetes: >6.4 ?         Glycemic control for adults with diabetes: <7.0 ?  ?  ?  ?  ?  Passed - Valid encounter within last 6 months  ?  Recent Outpatient Visits   ? ?      ? 2 months ago Type 2 diabetes mellitus with obesity ([place])  ? [place], [place] T, NP  ? 7 months ago Psychophysiological insomnia  ? [place], [place] T, NP  ? 9 months ago Type 2 diabetes mellitus with obesity ([place])  ? [place], [place] T, NP  ? 11 months ago Type 2 diabetes mellitus with obesity ([place])  ? [place], [place] T, NP  ? 1 year ago Type 2 diabetes mellitus with obesity ([place])  ? [facility] [place], Henrine Screws T, NP  ? ?  ?  ?Future Appointments   ? ?        ? In 4 weeks Cannady, [name], NP [facility]  ? ?  ? ? ?  ?  ?  ? ?

## 2021-07-17 NOTE — Patient Instructions (Signed)

## 2021-07-19 ENCOUNTER — Encounter: Payer: Self-pay | Admitting: Nurse Practitioner

## 2021-07-19 ENCOUNTER — Ambulatory Visit (INDEPENDENT_AMBULATORY_CARE_PROVIDER_SITE_OTHER): Payer: 59 | Admitting: Nurse Practitioner

## 2021-07-19 VITALS — BP 138/80 | HR 65 | Temp 98.2°F | Ht 64.0 in | Wt 185.0 lb

## 2021-07-19 DIAGNOSIS — E1159 Type 2 diabetes mellitus with other circulatory complications: Secondary | ICD-10-CM

## 2021-07-19 DIAGNOSIS — E1169 Type 2 diabetes mellitus with other specified complication: Secondary | ICD-10-CM | POA: Diagnosis not present

## 2021-07-19 DIAGNOSIS — Z1231 Encounter for screening mammogram for malignant neoplasm of breast: Secondary | ICD-10-CM | POA: Diagnosis not present

## 2021-07-19 DIAGNOSIS — I152 Hypertension secondary to endocrine disorders: Secondary | ICD-10-CM | POA: Diagnosis not present

## 2021-07-19 DIAGNOSIS — E1142 Type 2 diabetes mellitus with diabetic polyneuropathy: Secondary | ICD-10-CM | POA: Diagnosis not present

## 2021-07-19 DIAGNOSIS — Z794 Long term (current) use of insulin: Secondary | ICD-10-CM | POA: Diagnosis not present

## 2021-07-19 DIAGNOSIS — E785 Hyperlipidemia, unspecified: Secondary | ICD-10-CM

## 2021-07-19 DIAGNOSIS — F5104 Psychophysiologic insomnia: Secondary | ICD-10-CM

## 2021-07-19 DIAGNOSIS — E669 Obesity, unspecified: Secondary | ICD-10-CM

## 2021-07-19 DIAGNOSIS — R69 Illness, unspecified: Secondary | ICD-10-CM | POA: Diagnosis not present

## 2021-07-19 LAB — BAYER DCA HB A1C WAIVED: HB A1C (BAYER DCA - WAIVED): 7.4 % — ABNORMAL HIGH (ref 4.8–5.6)

## 2021-07-19 MED ORDER — LEVEMIR FLEXTOUCH 100 UNIT/ML ~~LOC~~ SOPN: 40.0000 [IU] | PEN_INJECTOR | Freq: Every day | SUBCUTANEOUS | 4 refills | Status: DC

## 2021-07-19 NOTE — Assessment & Plan Note (Signed)
Chronic, ongoing with A1c 7.4%, trend down from previous.  Continue Metformin 1000 MG BID + Farxiga, with goal of insulin reduction and possible discontinuation in future.  Continue current insulin dosing at this time due to increase in A1c -- would benefit having Ozempic back on board in future, may need assistance.  Continue Gabapentin for neuropathy.  B12 level at goal.  Return in 3 months. ?

## 2021-07-19 NOTE — Assessment & Plan Note (Signed)
Chronic, ongoing.  Continue current medication regimen and adjust as needed based on labs. Lipid panel up to date. ?

## 2021-07-19 NOTE — Assessment & Plan Note (Signed)
Chronic, ongoing with BP at goal today on recheck and on home readings. Continue current medication regimen at this time and adjust as needed.  Telmisartan offering kidney protection.  Recommend she check BP at least 3 times a week at home + focus on DASH diet.  Continue to monitor kidney function and if GFR < 45 consider reduction Metformin.  LABS: up to date.  Return in 3 months. ?

## 2021-07-19 NOTE — Progress Notes (Signed)
? ?BP 138/80 (BP Location: Left Arm)   Pulse 65   Temp 98.2 ?F (36.8 ?C) (Oral)   Ht '5\' 4"'$  (1.626 m)   Wt 185 lb (83.9 kg)   LMP  (LMP Unknown)   SpO2 97%   BMI 31.76 kg/m?   ? ?Subjective:  ? ? [name], female    DOB: [birth year], 66 y.o.   MRN: [id number] ? ?HPI: ?April Johnson is a 66 y.o. female ? ?Chief Complaint  ?Patient presents with  ? Diabetes  ?  Patient declines having a recent Diabetic Eye Exam as she just received new insurance.   ? Hyperlipidemia  ? Hypertension  ? Insomnia  ? ?DIABETES ?Follow-up for diabetes, last A1c 7.6%.  Taking Levemir 40 units (not every day though -- only if sugars high), Metformin 1000 MG BID, and Farxiga 10 MG daily.  Stopped Ozempic as was too expensive.  Taking leftover Wilder Glade that her husband had -- has 2 28 day supplies left.  Plus Ozempic made her sick to stomach, this is at 1 MG. ?Hypoglycemic episodes:no ?Polydipsia/polyuria: no ?Visual disturbance: no ?Chest pain: no ?Paresthesias: no ?Glucose Monitoring: yes ?            Accucheck frequency: daily ?            Fasting glucose: 137 to 140 ?            Post prandial: ?            Evening: 200 to 210 ?            Before meals: ?Taking Insulin?: yes ?            Long acting insulin: 40 units every other day ?            Short acting insulin: ?Blood Pressure Monitoring: occasionally ?Retinal Examination: Not up to Date -- waiting on insurance ?Foot Exam: Up to Date ?Pneumovax: Up to Date ?Influenza: Up to Date ?Aspirin: yes  ?  ?HYPERTENSION / HYPERLIPIDEMIA WITH CKD ?Continues on Amlodipine, ASA, Lipitor, Metoprolol, and Micardis.   ? ?Continues on Omeprazole for GERD which offers benefit. ?Satisfied with current treatment? yes ?Duration of hypertension: chronic ?BP monitoring frequency: checking a few times a week ?BP range: 130/70 range ?BP medication side effects: no ?Duration of hyperlipidemia: chronic ?Cholesterol medication side effects: no ?Cholesterol supplements:  none ?Medication compliance: good compliance ?Aspirin: yes ?Recent stressors: no ?Recurrent headaches: no ?Visual changes: no ?Palpitations: no ?Dyspnea: no ?Chest pain: no ?Lower extremity edema: no ?Dizzy/lightheaded: no ? ?INSOMNIA ?Continues on Trazodone since loss of her husband, this helps her to sleep better. ?Duration: months ?Satisfied with sleep quality: no ?Difficulty falling asleep: none ?Difficulty staying asleep: none ?Waking a few hours after sleep onset: none ?Early morning awakenings: none ?Daytime hypersomnolence: no ?Wakes feeling refreshed: no ?Good sleep hygiene: yes ?Apnea: no ?Snoring: no ?Depressed/anxious mood: yes ?Recent stress: yes ?Restless legs/nocturnal leg cramps: no ?Chronic pain/arthritis: no ?History of sleep study: no ?Treatments attempted: Trazodone ? ?  07/19/2021  ? 10:37 AM 04/19/2021  ? 11:02 AM 11/23/2020  ? 11:23 AM 04/01/2020  ? 10:40 AM 06/03/2019  ? 10:07 AM  ?Depression screen PHQ 2/9  ?Decreased Interest 1 0 0 0 0  ?Down, Depressed, Hopeless 0 1 0 0 0  ?PHQ - 2 Score 1 1 0 0 0  ?Altered sleeping 2 0 1 0   ?Tired, decreased energy '1 1 2 '$ 0   ?Change in appetite  0 0 0 0   ?Feeling bad or failure about yourself  0 0 0 0   ?Trouble concentrating 0 1 0 0   ?Moving slowly or fidgety/restless 0 0 0 0   ?Suicidal thoughts 0 0 0 0   ?PHQ-9 Score '4 3 3 '$ 0   ?Difficult doing work/chores  Not difficult at all Not difficult at all    ?  ? ?  07/19/2021  ? 10:37 AM 04/19/2021  ? 11:02 AM  ?GAD 7 : Generalized Anxiety Score  ?Nervous, Anxious, on Edge 0 1  ?Control/stop worrying 1 0  ?Worry too much - different things 0 1  ?Trouble relaxing 1 0  ?Restless 1 0  ?Easily annoyed or irritable 1 1  ?Afraid - awful might happen 0 0  ?Total GAD 7 Score 4 3  ?Anxiety Difficulty Not difficult at all Not difficult at all  ? ? ?Relevant past medical, surgical, family and social history reviewed and updated as indicated. Interim medical history since our last visit reviewed. ?Allergies and medications  reviewed and updated. ? ?Review of Systems  ?Constitutional:  Negative for activity change, appetite change, diaphoresis, fatigue and fever.  ?Respiratory:  Negative for cough, chest tightness and shortness of breath.   ?Cardiovascular:  Negative for chest pain, palpitations and leg swelling.  ?Gastrointestinal: Negative.   ?Endocrine: Negative for polydipsia, polyphagia and polyuria.  ?Neurological: Negative.   ?Psychiatric/Behavioral: Negative.    ? ?Per HPI unless specifically indicated above ? ?   ?Objective:  ?  ?BP 138/80 (BP Location: Left Arm)   Pulse 65   Temp 98.2 ?F (36.8 ?C) (Oral)   Ht '5\' 4"'$  (1.626 m)   Wt 185 lb (83.9 kg)   LMP  (LMP Unknown)   SpO2 97%   BMI 31.76 kg/m?   ?Wt Readings from Last 3 Encounters:  ?07/19/21 185 lb (83.9 kg)  ?04/19/21 182 lb 12.8 oz (82.9 kg)  ?11/23/20 170 lb (77.1 kg)  ?  ?Physical Exam ?Vitals and nursing note reviewed.  ?Constitutional:   ?   General: She is awake. She is not in acute distress. ?   Appearance: She is well-developed and well-groomed. She is obese. She is not ill-appearing.  ?HENT:  ?   Head: Normocephalic.  ?   Right Ear: Hearing normal.  ?   Left Ear: Hearing normal.  ?Eyes:  ?   General: Lids are normal.     ?   Right eye: No discharge.     ?   Left eye: No discharge.  ?   Conjunctiva/sclera: Conjunctivae normal.  ?   Pupils: Pupils are equal, round, and reactive to light.  ?Neck:  ?   Thyroid: No thyromegaly.  ?   Vascular: No carotid bruit.  ?Cardiovascular:  ?   Rate and Rhythm: Normal rate and regular rhythm.  ?   Heart sounds: Normal heart sounds. No murmur heard. ?  No gallop.  ?Pulmonary:  ?   Effort: Pulmonary effort is normal.  ?   Breath sounds: Normal breath sounds.  ?Abdominal:  ?   General: Bowel sounds are normal.  ?   Palpations: Abdomen is soft.  ?Musculoskeletal:  ?   Cervical back: Normal range of motion and neck supple.  ?   Right lower leg: No edema.  ?   Left lower leg: No edema.  ?Skin: ?   General: Skin is warm and dry.   ?Neurological:  ?   Mental Status: She is alert and oriented  to person, place, and time.  ?Psychiatric:     ?   Attention and Perception: Attention normal.     ?   Mood and Affect: Mood normal.     ?   Speech: Speech normal.     ?   Behavior: Behavior normal. Behavior is cooperative.     ?   Thought Content: Thought content normal.  ? ?Results for orders placed or performed in visit on 07/19/21  ?Bayer DCA Hb A1c Waived  ?Result Value Ref Range  ? HB A1C (BAYER DCA - WAIVED) 7.4 (H) 4.8 - 5.6 %  ? ?   ?Assessment & Plan:  ? ?Problem List Items Addressed This Visit   ? ?  ? Cardiovascular and Mediastinum  ? Hypertension associated with diabetes ([place])  ?  Chronic, ongoing with BP at goal today on recheck and on home readings. Continue current medication regimen at this time and adjust as needed.  Telmisartan offering kidney protection.  Recommend she check BP at least 3 times a week at home + focus on DASH diet.  Continue to monitor kidney function and if GFR < 45 consider reduction Metformin.  LABS: up to date.  Return in 3 months. ? ?  ?  ? Relevant Medications  ? dapagliflozin propanediol (FARXIGA) 10 MG TABS tablet  ? insulin detemir (LEVEMIR FLEXTOUCH) 100 UNIT/ML FlexPen  ? Other Relevant Orders  ? Bayer DCA Hb A1c Waived (Completed)  ?  ? Endocrine  ? Diabetic peripheral neuropathy ([place])  ?  Chronic, ongoing with A1c 7.4%, trend down from previous.  Continue Metformin 1000 MG BID + Farxiga, with goal of insulin reduction and possible discontinuation in future.  Continue current insulin dosing at this time due to increase in A1c -- would benefit having Ozempic back on board in future, may need assistance.  Continue Gabapentin for neuropathy.  B12 level at goal.  Return in 3 months. ?  ?  ? Relevant Medications  ? dapagliflozin propanediol (FARXIGA) 10 MG TABS tablet  ? insulin detemir (LEVEMIR FLEXTOUCH) 100 UNIT/ML FlexPen  ? Other Relevant Orders  ? Bayer DCA Hb A1c Waived (Completed)  ? Hyperlipidemia  associated with type 2 diabetes mellitus ([place])  ?  Chronic, ongoing.  Continue current medication regimen and adjust as needed based on labs. Lipid panel up to date. ? ?  ?  ? Relevant Medications  ? dapagliflozin propanediol (FA

## 2021-07-19 NOTE — Assessment & Plan Note (Signed)
Chronic, ongoing with A1c 7.4% today, trend down due to diet changes.  Urine ALB 10 and A:C <30 (February 2023).  Continue Metformin 1000 MG BID + Farxiga, with goal of insulin reduction and possible discontinuation in future.  Continue current insulin dosing at this time due to increase in A1c -- would benefit having Ozempic back on board in future, may need assistance. Continue Telmisartan for kidney protection.   Return in 3 months. ?

## 2021-07-19 NOTE — Assessment & Plan Note (Signed)
Refer to diabetes plan of care. 

## 2021-07-19 NOTE — Assessment & Plan Note (Signed)
Due to grieving process, at this time sleep improved with Trazodone.  Will continue current medication regimen and adjust as needed.  At present Trazodone beneficial both to mood and sleep pattern.  Denies any SI/HI.  Recommend she continue current support group sittings.  Return in 3 months. ?

## 2021-07-21 ENCOUNTER — Other Ambulatory Visit: Payer: Self-pay | Admitting: Nurse Practitioner

## 2021-07-21 NOTE — Telephone Encounter (Signed)
Requested medication (s) are due for refill today - expired Rx ? ?Requested medication (s) are on the active medication list -yes ? ?Future visit scheduled -yes ? ?Last refill: 07/02/20 #90 4RF ? ?Notes to clinic: Expired Rx ? ?Requested Prescriptions  ?Pending Prescriptions Disp Refills  ? omeprazole (PRILOSEC) 20 MG capsule [Pharmacy Med Name: OMEPRAZOLE DR 20 MG CAPSULE] 90 capsule 4  ?  Sig: TAKE 1 CAPSULE BY MOUTH EVERY DAY  ?  ? Gastroenterology: Proton Pump Inhibitors Passed - 07/21/2021  1:59 AM  ?  ?  Passed - Valid encounter within last 12 months  ?  Recent Outpatient Visits   ? ?      ? 2 days ago Type 2 diabetes mellitus with obesity ([place])  ? [place], Bingen T, NP  ? 3 months ago Type 2 diabetes mellitus with obesity ([place])  ? [place], [place]un[place] T, NP  ? 8 months ago Psychophysiological insomnia  ? [place], [place]rest T, NP  ? 10 months ago Type 2 diabetes mellitus with obesity ([place])  ? [place], [place] T, NP  ? 1 year ago Type 2 diabetes mellitus with obesity ([place])  ? [facility] [place], Henrine Screws T, NP  ? ?  ?  ?Future Appointments   ? ?        ? In 3 months Cannady, [name], NP [facility]  ? ?  ? ? ?  ?  ?  ? ? ? ?Requested Prescriptions  ?Pending Prescriptions Disp Refills  ? omeprazole (PRILOSEC) 20 MG capsule [Pharmacy Med Name: OMEPRAZOLE DR 20 MG CAPSULE] 90 capsule 4  ?  Sig: TAKE 1 CAPSULE BY MOUTH EVERY DAY  ?  ? Gastroenterology: Proton Pump Inhibitors Passed - 07/21/2021  1:59 AM  ?  ?  Passed - Valid encounter within last 12 months  ?  Recent Outpatient Visits   ? ?      ? 2 days ago Type 2 diabetes mellitus with obesity ([place])  ? [place], [place] T, NP  ? 3 months ago Type 2 diabetes mellitus with obesity ([place])  ? [place], [place]on[place] T, NP  ? 8 months ago Psychophysiological insomnia  ? [place], [place]lake T,  NP  ? 10 months ago Type 2 diabetes mellitus with obesity ([place])  ? [place], [place] T, NP  ? 1 year ago Type 2 diabetes mellitus with obesity ([place])  ? [facility] [place], Henrine Screws T, NP  ? ?  ?  ?Future Appointments   ? ?        ? In 3 months Cannady, [name], NP [facility]  ? ?  ? ? ?  ?  ?  ? ? ? ?

## 2021-09-03 ENCOUNTER — Other Ambulatory Visit: Payer: Self-pay | Admitting: Nurse Practitioner

## 2021-09-03 NOTE — Telephone Encounter (Signed)
Requested Prescriptions  Pending Prescriptions Disp Refills  . traZODone (DESYREL) 50 MG tablet [Pharmacy Med Name: TRAZODONE 50 MG TABLET] 90 tablet 1    Sig: TAKE 1/2 TO 1 TABLET BY MOUTH AT BEDTIME AS NEEDED FOR SLEEP     Psychiatry: Antidepressants - Serotonin Modulator Passed - 09/03/2021 11:32 AM      Passed - Valid encounter within last 6 months    Recent Outpatient Visits          1 month ago Type 2 diabetes mellitus with obesity ([place])   Bernie [place], [name] T, NP   4 months ago Type 2 diabetes mellitus with obesity ([place])   Sebastian Cannady, [name], NP   9 months ago Psychophysiological insomnia   [place] Terwill[place], [place] T, NP   11 months ago Type 2 diabetes mellitus with obesity ([place])   [place], [name] T, NP   1 year ago Type 2 diabetes mellitus with obesity ([place])   [place], [name], NP      Future Appointments            In 1 month Cannady, [name], NP [facility] [facility]

## 2021-09-22 ENCOUNTER — Other Ambulatory Visit: Payer: Self-pay | Admitting: Nurse Practitioner

## 2021-09-23 NOTE — Telephone Encounter (Signed)
Requested Prescriptions  Pending Prescriptions Disp Refills  . LEVEMIR FLEXTOUCH 100 UNIT/ML FlexTouch Pen [Pharmacy Med Name: LEVEMIR FLEXTOUCH 100 UNIT/ML] 15 mL 4    Sig: INJECT 60 UNITS INTO THE SKIN AT BEDTIME.     Endocrinology:  Diabetes - Insulins Passed - 09/22/2021  6:20 PM      Passed - HBA1C is between 0 and 7.9 and within 180 days    Hemoglobin A1C  Date Value Ref Range Status  10/27/2015 7.3%  Final   HB A1C (BAYER DCA - WAIVED)  Date Value Ref Range Status  07/19/2021 7.4 (H) 4.8 - 5.6 % Final    Comment:             Prediabetes: 5.7 - 6.4          Diabetes: >6.4          Glycemic control for adults with diabetes: <7.0          Passed - Valid encounter within last 6 months    Recent Outpatient Visits          2 months ago Type 2 diabetes mellitus with obesity ([place])   [place], [name] T, NP   5 months ago Type 2 diabetes mellitus with obesity ([place])   [place], [name], NP   10 months ago Psychophysiological insomnia   [place], Quintana T, NP   1 year ago Type 2 diabetes mellitus with obesity ([place])   [place], [name] T, NP   1 year ago Type 2 diabetes mellitus with obesity ([place])   [place], [name], NP      Future Appointments            In 3 weeks Cannady, [name], NP [facility] [facility]

## 2021-09-29 ENCOUNTER — Other Ambulatory Visit: Payer: Self-pay | Admitting: Nurse Practitioner

## 2021-09-30 NOTE — Telephone Encounter (Signed)
Requested medication (s) are due for refill today: no  Requested medication (s) are on the active medication list:no  Last refill:  04/19/21 and 07/19/21  Future visit scheduled:yes  Notes to clinic:  Unable to refill per protocol, Rx expired.  Medications were discontinued by PCP.     Requested Prescriptions  Pending Prescriptions Disp Refills   FARXIGA 5 MG TABS tablet [Pharmacy Med Name: FARXIGA 5 MG TABLET] 90 tablet 4    Sig: TAKE 1 TABLET (5 MG TOTAL) BY MOUTH DAILY.     Endocrinology:  Diabetes - SGLT2 Inhibitors Passed - 09/29/2021 12:09 PM      Passed - Cr in normal range and within 360 days    Creatinine, Ser  Date Value Ref Range Status  04/19/2021 0.78 0.57 - 1.00 mg/dL Final         Passed - HBA1C is between 0 and 7.9 and within 180 days    Hemoglobin A1C  Date Value Ref Range Status  10/27/2015 7.3%  Final   HB A1C (BAYER DCA - WAIVED)  Date Value Ref Range Status  07/19/2021 7.4 (H) 4.8 - 5.6 % Final    Comment:             Prediabetes: 5.7 - 6.4          Diabetes: >6.4          Glycemic control for adults with diabetes: <7.0          Passed - eGFR in normal range and within 360 days    GFR calc Af Amer  Date Value Ref Range Status  04/01/2020 95 >59 mL/min/1.73 Final    Comment:    **In accordance with recommendations from the NKF-ASN Task force,**   [facility] is in the process of updating its eGFR calculation to the   2021 CKD-EPI creatinine equation that estimates kidney function   without a race variable.    GFR calc non Af Amer  Date Value Ref Range Status  04/01/2020 82 >59 mL/min/1.73 Final   eGFR  Date Value Ref Range Status  04/19/2021 85 >59 mL/min/1.73 Final         Passed - Valid encounter within last 6 months    Recent Outpatient Visits           2 months ago Type 2 diabetes mellitus with obesity ([place])   [place], [name] T, NP   5 months ago Type 2 diabetes mellitus with obesity ([place])   [place], [name], NP   10 months ago Psychophysiological insomnia   [place], [place] T, NP   1 year ago Type 2 diabetes mellitus with obesity ([place])   [place], [name] T, NP   1 year ago Type 2 diabetes mellitus with obesity ([place])   [place], [name], NP       Future Appointments             In 2 weeks Cannady, [name], NP [facility]             OZEMPIC, 1 MG/DOSE, 4 MG/3ML SOPN [Pharmacy Med Name: OZEMPIC 1 MG/DOSE (4 MG/3 ML)]  6    Sig: INJECT 1 MG UNDER SKIN WEEKLY     Endocrinology:  Diabetes - GLP-1 Receptor Agonists - semaglutide Failed - 09/29/2021 12:09 PM      Failed - HBA1C in normal range and within 180 days  Hemoglobin A1C  Date Value Ref Range Status  10/27/2015 7.3%  Final   HB A1C (BAYER DCA - WAIVED)  Date Value Ref Range Status  07/19/2021 7.4 (H) 4.8 - 5.6 % Final    Comment:             Prediabetes: 5.7 - 6.4          Diabetes: >6.4          Glycemic control for adults with diabetes: <7.0          Passed - Cr in normal range and within 360 days    Creatinine, Ser  Date Value Ref Range Status  04/19/2021 0.78 0.57 - 1.00 mg/dL Final         Passed - Valid encounter within last 6 months    Recent Outpatient Visits           2 months ago Type 2 diabetes mellitus with obesity ([place])   [place], [name] T, NP   5 months ago Type 2 diabetes mellitus with obesity ([place])   [place], [name], NP   10 months ago Psychophysiological insomnia   [place], [place] T, NP   1 year ago Type 2 diabetes mellitus with obesity ([place])   [place], [name] T, NP   1 year ago Type 2 diabetes mellitus with obesity ([place])   [place], [name], NP       Future Appointments             In 2 weeks Cannady, [name], NP [facility]

## 2021-10-14 ENCOUNTER — Ambulatory Visit (INDEPENDENT_AMBULATORY_CARE_PROVIDER_SITE_OTHER): Payer: HMO | Admitting: Nurse Practitioner

## 2021-10-14 ENCOUNTER — Telehealth: Payer: Self-pay

## 2021-10-14 ENCOUNTER — Encounter: Payer: Self-pay | Admitting: Nurse Practitioner

## 2021-10-14 VITALS — BP 128/86 | HR 69 | Temp 98.1°F | Ht 64.0 in | Wt 183.3 lb

## 2021-10-14 DIAGNOSIS — E669 Obesity, unspecified: Secondary | ICD-10-CM | POA: Diagnosis not present

## 2021-10-14 DIAGNOSIS — E1159 Type 2 diabetes mellitus with other circulatory complications: Secondary | ICD-10-CM

## 2021-10-14 DIAGNOSIS — E1142 Type 2 diabetes mellitus with diabetic polyneuropathy: Secondary | ICD-10-CM | POA: Diagnosis not present

## 2021-10-14 DIAGNOSIS — E785 Hyperlipidemia, unspecified: Secondary | ICD-10-CM

## 2021-10-14 DIAGNOSIS — M546 Pain in thoracic spine: Secondary | ICD-10-CM

## 2021-10-14 DIAGNOSIS — E1169 Type 2 diabetes mellitus with other specified complication: Secondary | ICD-10-CM | POA: Diagnosis not present

## 2021-10-14 DIAGNOSIS — Z794 Long term (current) use of insulin: Secondary | ICD-10-CM

## 2021-10-14 DIAGNOSIS — I152 Hypertension secondary to endocrine disorders: Secondary | ICD-10-CM | POA: Diagnosis not present

## 2021-10-14 LAB — BAYER DCA HB A1C WAIVED: HB A1C (BAYER DCA - WAIVED): 7.3 % — ABNORMAL HIGH (ref 4.8–5.6)

## 2021-10-14 MED ORDER — TIZANIDINE HCL 4 MG PO TABS: 4.0000 mg | ORAL_TABLET | Freq: Four times a day (QID) | ORAL | 0 refills | Status: DC | PRN

## 2021-10-14 MED ORDER — LIDOCAINE 5 % EX PTCH: 1.0000 | MEDICATED_PATCH | CUTANEOUS | 0 refills | Status: DC

## 2021-10-14 NOTE — Assessment & Plan Note (Signed)
Chronic, ongoing with A1c 7.4% recent visit, will recheck today.  Continue Metformin 1000 MG BID + Farxiga.  Will see if can get assist for Ozempic with goal of insulin reduction and possible discontinuation in future.  Continue current insulin dosing at this time due to A1c -- would benefit having Ozempic back on board in future, work on assistance.  Return in 3 months.

## 2021-10-14 NOTE — Telephone Encounter (Signed)
Prior authorization was initiated via CoverMyMeds for prescription of Lidocaine 5% Patches. Awaiting determination from patient's insurance.   KEY: [id number]

## 2021-10-14 NOTE — Assessment & Plan Note (Signed)
Chronic, ongoing with BP at goal today on recheck in office today. Continue current medication regimen at this time and adjust as needed.  Telmisartan offering kidney protection.  Recommend she check BP at least 3 times a week at home + focus on DASH diet.  Continue to monitor kidney function and if GFR < 45 consider reduction Metformin.  LABS: CMP.  Return in 3 months.

## 2021-10-14 NOTE — Assessment & Plan Note (Signed)
Chronic, ongoing.  Continue current medication regimen and adjust as needed based on labs. Lipid panel today. 

## 2021-10-14 NOTE — Assessment & Plan Note (Signed)
Acute for one week after lifting heavy item.  Will send in Tizanidine and Lidocaine patches.  Recommend she continue heat at home and change to Tylenol, stop Aleeve due to blood pressure.  Perform gentle stretches at home and ensure rest at this time.  If ongoing or worsening notify provider.

## 2021-10-14 NOTE — Assessment & Plan Note (Addendum)
Chronic, ongoing with A1c 7.4% recent visit, will recheck today.  Continue Metformin 1000 MG BID + Farxiga.  Will see if can get assist for Ozempic with goal of insulin reduction and possible discontinuation in future.  Continue current insulin dosing at this time due to A1c -- would benefit having Ozempic back on board in future, work on assistance.  Continue Gabapentin for neuropathy.  B12 level at goal.  Return in 3 months.

## 2021-10-14 NOTE — Patient Instructions (Signed)
Acute Back Pain, Adult Acute back pain is sudden and usually short-lived. It is often caused by an injury to the muscles and tissues in the back. The injury may result from: A muscle, tendon, or ligament getting overstretched or torn. Ligaments are tissues that connect bones to each other. Lifting something improperly can cause a back strain. Wear and tear (degeneration) of the spinal disks. Spinal disks are circular tissue that provide cushioning between the bones of the spine (vertebrae). Twisting motions, such as while playing sports or doing yard work. A hit to the back. Arthritis. You may have a physical exam, lab tests, and imaging tests to find the cause of your pain. Acute back pain usually goes away with rest and home care. Follow these instructions at home: Managing pain, stiffness, and swelling Take over-the-counter and prescription medicines only as told by your health care provider. Treatment may include medicines for pain and inflammation that are taken by mouth or applied to the skin, or muscle relaxants. Your health care provider may recommend applying ice during the first 24-48 hours after your pain starts. To do this: Put ice in a plastic bag. Place a towel between your skin and the bag. Leave the ice on for 20 minutes, 2-3 times a day. Remove the ice if your skin turns bright red. This is very important. If you cannot feel pain, heat, or cold, you have a greater risk of damage to the area. If directed, apply heat to the affected area as often as told by your health care provider. Use the heat source that your health care provider recommends, such as a moist heat pack or a heating pad. Place a towel between your skin and the heat source. Leave the heat on for 20-30 minutes. Remove the heat if your skin turns bright red. This is especially important if you are unable to feel pain, heat, or cold. You have a greater risk of getting burned. Activity  Do not stay in bed. Staying in  bed for more than 1-2 days can delay your recovery. Sit up and stand up straight. Avoid leaning forward when you sit or hunching over when you stand. If you work at a desk, sit close to it so you do not need to lean over. Keep your chin tucked in. Keep your neck drawn back, and keep your elbows bent at a 90-degree angle (right angle). Sit high and close to the steering wheel when you drive. Add lower back (lumbar) support to your car seat, if needed. Take short walks on even surfaces as soon as you are able. Try to increase the length of time you walk each day. Do not sit, drive, or stand in one place for more than 30 minutes at a time. Sitting or standing for long periods of time can put stress on your back. Do not drive or use heavy machinery while taking prescription pain medicine. Use proper lifting techniques. When you bend and lift, use positions that put less stress on your back: Bend your knees. Keep the load close to your body. Avoid twisting. Exercise regularly as told by your health care provider. Exercising helps your back heal faster and helps prevent back injuries by keeping muscles strong and flexible. Work with a physical therapist to make a safe exercise program, as recommended by your health care provider. Do any exercises as told by your physical therapist. Lifestyle Maintain a healthy weight. Extra weight puts stress on your back and makes it difficult to have good   posture. Avoid activities or situations that make you feel anxious or stressed. Stress and anxiety increase muscle tension and can make back pain worse. Learn ways to manage anxiety and stress, such as through exercise. General instructions Sleep on a firm mattress in a comfortable position. Try lying on your side with your knees slightly bent. If you lie on your back, put a pillow under your knees. Keep your head and neck in a straight line with your spine (neutral position) when using electronic equipment like  smartphones or pads. To do this: Raise your smartphone or pad to look at it instead of bending your head or neck to look down. Put the smartphone or pad at the level of your face while looking at the screen. Follow your treatment plan as told by your health care provider. This may include: Cognitive or behavioral therapy. Acupuncture or massage therapy. Meditation or yoga. Contact a health care provider if: You have pain that is not relieved with rest or medicine. You have increasing pain going down into your legs or buttocks. Your pain does not improve after 2 weeks. You have pain at night. You lose weight without trying. You have a fever or chills. You develop nausea or vomiting. You develop abdominal pain. Get help right away if: You develop new bowel or bladder control problems. You have unusual weakness or numbness in your arms or legs. You feel faint. These symptoms may represent a serious problem that is an emergency. Do not wait to see if the symptoms will go away. Get medical help right away. Call your local emergency services (911 in the [place] Do not drive yourself to the hospital. Summary Acute back pain is sudden and usually short-lived. Use proper lifting techniques. When you bend and lift, use positions that put less stress on your back. Take over-the-counter and prescription medicines only as told by your health care provider, and apply heat or ice as told. This information is not intended to replace advice given to you by your health care provider. Make sure you discuss any questions you have with your health care provider. Document Revised: 05/15/2020 Document Reviewed: 05/15/2020 Elsevier Patient Education  2023 [employer]  

## 2021-10-14 NOTE — Progress Notes (Signed)
Contacted via [place] afternoon April Johnson, your A1c returned and is coming down from 7.4% to 7.3%.  We will see if we can get help with cost of Ozempic.  Let me know if you do not hear from a pharmacist over the next week.  Any questions?  Remainder of labs will return tomorrow. Continue current medication regimen. Keep being stellar!!  Thank you for allowing me to participate in your care.  I appreciate you. Kindest regards, Ata Pecha

## 2021-10-14 NOTE — Progress Notes (Signed)
BP 128/86 (BP Location: Left Arm, Patient Position: Sitting, Cuff Size: Normal)   Pulse 69   Temp 98.1 F (36.7 C) (Oral)   Ht '5\' 4"'$  (1.626 m)   Wt 183 lb 4.8 oz (83.1 kg)   LMP  (LMP Unknown)   SpO2 99%   BMI 31.46 kg/m    Subjective:    [name], female    DOB: 1956-02-27, 66 y.o.   MRN: [id number]  HPI: April Johnson is a 66 y.o. female  Chief Complaint  Patient presents with   Back Pain    Patient is here for Back Pain for the past week. Patient says she has an heavy fan and she lifted the fan and thinks that may be where the pain is coming from. Patient says she has tried Aleve Back and Muscle, warm and cold compress.    Medication Management    Patient says she is no longer taking the Ozempic prescription cause even with insurance she says it is still $300 a month co-pay.    BACK PAIN Started one week ago.  Lifted a heavy fan and tweaked back, started to have pain when went to beach after.  Her friends massaged back + took Aleeve back and muscle + heat and ice. Duration: months Mechanism of injury: lifting Location: Left and midline Onset: sudden Severity: 10/10 Quality: dull, aching, and throbbing Frequency: intermittent Radiation: none Aggravating factors: prolonged sitting Alleviating factors: rest, ice, heat, and NSAIDs Status: fluctuating Treatments attempted: rest, ice, heat, and aleve  Relief with NSAIDs?: moderate Nighttime pain:  yes Paresthesias / decreased sensation:  no Bowel / bladder incontinence:  no Fevers:  no Dysuria / urinary frequency:  no   DIABETES Last A1c 7.4% in August.  Taking Levemir 30 units daily, Metformin 1000 MG BID, and Farxiga 10 MG daily.  Stopped Ozempic as was too expensive, cost $300.   Hypoglycemic episodes:no Polydipsia/polyuria: no Visual disturbance: no Chest pain: no Paresthesias: no Glucose Monitoring: yes             Accucheck frequency: daily             Fasting glucose: 130  range             Post prandial:             Evening: 150 to 180 range             Before meals: Taking Insulin?: yes             Long acting insulin: 30 units every day             Short acting insulin: Blood Pressure Monitoring: occasionally Retinal Examination: Not up to Date -- needs to schedule Foot Exam: Up to Date Pneumovax: Up to Date Influenza: Up to Date Aspirin: yes    HYPERTENSION / HYPERLIPIDEMIA WITH CKD Continues on Amlodipine, ASA, Lipitor, Metoprolol, and Micardis.     Continues on Omeprazole for GERD which offers benefit. Satisfied with current treatment? yes Duration of hypertension: chronic BP monitoring frequency: not checking BP range:  BP medication side effects: no Duration of hyperlipidemia: chronic Cholesterol medication side effects: no Cholesterol supplements: none Medication compliance: good compliance Aspirin: yes Recent stressors: no Recurrent headaches: no Visual changes: no Palpitations: no Dyspnea: no Chest pain: no Lower extremity edema: no Dizzy/lightheaded: no    Relevant past medical, surgical, family and social history reviewed and updated as indicated. Interim medical history since our last visit  reviewed. Allergies and medications reviewed and updated.  Review of Systems  Constitutional:  Negative for activity change, appetite change, diaphoresis, fatigue and fever.  Respiratory:  Negative for cough, chest tightness and shortness of breath.   Cardiovascular:  Negative for chest pain, palpitations and leg swelling.  Gastrointestinal: Negative.   Endocrine: Negative for polydipsia, polyphagia and polyuria.  Musculoskeletal:  Positive for back pain.  Neurological: Negative.   Psychiatric/Behavioral: Negative.      Per HPI unless specifically indicated above     Objective:    BP 128/86 (BP Location: Left Arm, Patient Position: Sitting, Cuff Size: Normal)   Pulse 69   Temp 98.1 F (36.7 C) (Oral)   Ht '5\' 4"'$  (1.626 m)    Wt 183 lb 4.8 oz (83.1 kg)   LMP  (LMP Unknown)   SpO2 99%   BMI 31.46 kg/m   Wt Readings from Last 3 Encounters:  10/14/21 183 lb 4.8 oz (83.1 kg)  07/19/21 185 lb (83.9 kg)  04/19/21 182 lb 12.8 oz (82.9 kg)    Physical Exam Vitals and nursing note reviewed.  Constitutional:      General: She is awake. She is not in acute distress.    Appearance: She is well-developed and well-groomed. She is obese. She is not ill-appearing.  HENT:     Head: Normocephalic.     Right Ear: Hearing normal.     Left Ear: Hearing normal.  Eyes:     General: Lids are normal.        Right eye: No discharge.        Left eye: No discharge.     Conjunctiva/sclera: Conjunctivae normal.     Pupils: Pupils are equal, round, and reactive to light.  Neck:     Thyroid: No thyromegaly.     Vascular: No carotid bruit.  Cardiovascular:     Rate and Rhythm: Normal rate and regular rhythm.     Heart sounds: Normal heart sounds. No murmur heard.    No gallop.  Pulmonary:     Effort: Pulmonary effort is normal.     Breath sounds: Normal breath sounds.  Abdominal:     General: Bowel sounds are normal.     Palpations: Abdomen is soft.  Musculoskeletal:     Cervical back: Normal range of motion and neck supple.     Thoracic back: Spasms (to left lat dorsi area) and tenderness present. No swelling, edema or bony tenderness. Normal range of motion. No scoliosis.     Right lower leg: No edema.     Left lower leg: No edema.  Skin:    General: Skin is warm and dry.  Neurological:     Mental Status: She is alert and oriented to person, place, and time.  Psychiatric:        Attention and Perception: Attention normal.        Mood and Affect: Mood normal.        Speech: Speech normal.        Behavior: Behavior normal. Behavior is cooperative.        Thought Content: Thought content normal.     Results for orders placed or performed in visit on 07/19/21  Bayer DCA Hb A1c Waived  Result Value Ref Range   HB  A1C (BAYER DCA - WAIVED) 7.4 (H) 4.8 - 5.6 %      Assessment & Plan:   Problem List Items Addressed This Visit       Cardiovascular and Mediastinum  Hypertension associated with diabetes ([place])    Chronic, ongoing with BP at goal today on recheck in office today. Continue current medication regimen at this time and adjust as needed.  Telmisartan offering kidney protection.  Recommend she check BP at least 3 times a week at home + focus on DASH diet.  Continue to monitor kidney function and if GFR < 45 consider reduction Metformin.  LABS: CMP.  Return in 3 months.      Relevant Orders   Comprehensive metabolic panel     Endocrine   Diabetic peripheral neuropathy (HCC)    Chronic, ongoing with A1c 7.4% recent visit, will recheck today.  Continue Metformin 1000 MG BID + Farxiga.  Will see if can get assist for Ozempic with goal of insulin reduction and possible discontinuation in future.  Continue current insulin dosing at this time due to A1c -- would benefit having Ozempic back on board in future, work on assistance.  Continue Gabapentin for neuropathy.  B12 level at goal.  Return in 3 months.      Relevant Medications   tiZANidine (ZANAFLEX) 4 MG tablet   Other Relevant Orders   AMB Referral to [facility] Hb A1c Waived   Comprehensive metabolic panel   Hyperlipidemia associated with type 2 diabetes mellitus (HCC)    Chronic, ongoing.  Continue current medication regimen and adjust as needed based on labs. Lipid panel today.      Relevant Orders   Lipid Panel w/o Chol/HDL Ratio   Type 2 diabetes mellitus with obesity (HCC) - Primary    Chronic, ongoing with A1c 7.4% recent visit, will recheck today.  Continue Metformin 1000 MG BID + Farxiga.  Will see if can get assist for Ozempic with goal of insulin reduction and possible discontinuation in future.  Continue current insulin dosing at this time due to A1c -- would benefit having Ozempic back on board in  future, work on assistance.  Return in 3 months.      Relevant Orders   AMB Referral to [facility] Hb A1c Waived   Comprehensive metabolic panel     Other   Insulin long-term use ([place])    Refer to diabetes plan of care.      Thoracic back pain    Acute for one week after lifting heavy item.  Will send in Tizanidine and Lidocaine patches.  Recommend she continue heat at home and change to Tylenol, stop Aleeve due to blood pressure.  Perform gentle stretches at home and ensure rest at this time.  If ongoing or worsening notify provider.      Relevant Medications   tiZANidine (ZANAFLEX) 4 MG tablet     Follow up plan: Return in about 3 months (around 01/14/2022) for T2DM, HTN/HLD, MOOD + PCV20 next visit please.

## 2021-10-14 NOTE — Assessment & Plan Note (Signed)
Refer to diabetes plan of care. 

## 2021-10-15 ENCOUNTER — Other Ambulatory Visit: Payer: Self-pay | Admitting: Nurse Practitioner

## 2021-10-15 LAB — COMPREHENSIVE METABOLIC PANEL
ALT: 23 IU/L (ref 0–32)
AST: 14 IU/L (ref 0–40)
Albumin/Globulin Ratio: 2.1 (ref 1.2–2.2)
Albumin: 4.7 g/dL (ref 3.9–4.9)
Alkaline Phosphatase: 82 IU/L (ref 44–121)
BUN/Creatinine Ratio: 20 (ref 12–28)
BUN: 17 mg/dL (ref 8–27)
Bilirubin Total: 0.3 mg/dL (ref 0.0–1.2)
CO2: 26 mmol/L (ref 20–29)
Calcium: 9.9 mg/dL (ref 8.7–10.3)
Chloride: 97 mmol/L (ref 96–106)
Creatinine, Ser: 0.83 mg/dL (ref 0.57–1.00)
Globulin, Total: 2.2 g/dL (ref 1.5–4.5)
Glucose: 118 mg/dL — ABNORMAL HIGH (ref 70–99)
Potassium: 4.5 mmol/L (ref 3.5–5.2)
Sodium: 138 mmol/L (ref 134–144)
Total Protein: 6.9 g/dL (ref 6.0–8.5)
eGFR: 78 mL/min/{1.73_m2} (ref >59)

## 2021-10-15 LAB — LIPID PANEL W/O CHOL/HDL RATIO
Cholesterol, Total: 194 mg/dL (ref 100–199)
HDL: 46 mg/dL (ref >39)
LDL Chol Calc (NIH): 100 mg/dL — ABNORMAL HIGH (ref 0–99)
Triglycerides: 283 mg/dL — ABNORMAL HIGH (ref 0–149)
VLDL Cholesterol Cal: 48 mg/dL — ABNORMAL HIGH (ref 5–40)

## 2021-10-15 MED ORDER — ATORVASTATIN CALCIUM 40 MG PO TABS: 40.0000 mg | ORAL_TABLET | Freq: Every day | ORAL | 3 refills | Status: DC

## 2021-10-15 NOTE — Progress Notes (Signed)
Contacted via April Johnson, your labs have returned: - Kidney function, creatinine and eGFR, remains normal, as is liver function, AST and ALT.   - Cholesterol levels are above goal.  I would like to increase your Atorvastatin to 40 MG, please complete your 20 MG pills and then start the 40 MG dosing I send in.  We will recheck levels next visit.  Any questions? Keep being awesome!!  Thank you for allowing me to participate in your care.  I appreciate you. Kindest regards, Tarena Gockley

## 2021-10-19 ENCOUNTER — Ambulatory Visit: Payer: 59 | Admitting: Nurse Practitioner

## 2021-10-20 ENCOUNTER — Telehealth: Payer: Self-pay

## 2021-10-20 NOTE — Chronic Care Management (AMB) (Signed)
  Chronic Care Management   Note  10/20/2021 Name: April Johnson MRN: [id number] DOB: Aug 18, 1955  April Johnson is a 66 y.o. year old female who is a primary care patient of Cannady, [name], NP. I reached out to [name] by phone today in response to a referral sent by April Johnson's PCP.  April Johnson was given information about Chronic Care Management services today including:  CCM service includes personalized support from designated clinical staff supervised by her physician, including individualized plan of care and coordination with other care providers 24/7 contact phone numbers for assistance for urgent and routine care needs. Service will only be billed when office clinical staff spend 20 minutes or more in a month to coordinate care. Only one practitioner may furnish and bill the service in a calendar month. The patient may stop CCM services at any time (effective at the end of the month) by phone call to the office staff. The patient is responsible for co-pay (up to 20% after annual deductible is met) if co-pay is required by the individual health plan.   Patient agreed to services and verbal consent obtained.   Follow up plan: Telephone appointment with care management team member scheduled for:12/27/2021  Noreene Larsson, [place], [place] [address] Direct Dial: [phone] [email or web address]

## 2021-11-04 ENCOUNTER — Other Ambulatory Visit: Payer: Self-pay

## 2021-11-04 ENCOUNTER — Telehealth: Payer: Self-pay

## 2021-11-04 DIAGNOSIS — Z1231 Encounter for screening mammogram for malignant neoplasm of breast: Secondary | ICD-10-CM

## 2021-11-04 NOTE — Telephone Encounter (Signed)
Called patient and discussed ordering and scheduling mammogram. Patient agreed to have this done. Called over to [place] and scheduled for the patient. Appointment is 12/01/21 at 10:40 AM at [facility]. Called and LVM asking for patient to please call back for appointment date and time.   OK for [facility] to speak to patient and notify her of appointment date and time if she calls back.

## 2021-11-24 ENCOUNTER — Other Ambulatory Visit: Payer: Self-pay | Admitting: Nurse Practitioner

## 2021-11-25 NOTE — Telephone Encounter (Signed)
Requested medication (s) are due for refill today: yes  Requested medication (s) are on the active medication list:yes  Last refill:  10/14/21  Future visit scheduled: yes  Notes to clinic:  Unable to refill per protocol, cannot delegate.      Requested Prescriptions  Pending Prescriptions Disp Refills   tiZANidine (ZANAFLEX) 4 MG tablet [Pharmacy Med Name: TIZANIDINE HCL 4 MG TABLET] 45 tablet 0    Sig: TAKE 1 TABLET BY MOUTH EVERY 6 HOURS AS NEEDED FOR MUSCLE SPASMS.     Not Delegated - Cardiovascular:  Alpha-2 Agonists - tizanidine Failed - 11/24/2021  2:10 PM      Failed - This refill cannot be delegated      Passed - Valid encounter within last 6 months    Recent Outpatient Visits           1 month ago Type 2 diabetes mellitus with obesity ([place])   [place], [name] T, NP   4 months ago Type 2 diabetes mellitus with obesity ([place])   [place], [name] T, NP   7 months ago Type 2 diabetes mellitus with obesity ([place])   [place], [name], NP   1 year ago Psychophysiological insomnia   [place] [facility], [place] T, NP   1 year ago Type 2 diabetes mellitus with obesity ([place])   [place], [name], NP       Future Appointments             In 1 month Cannady, [name], NP [employer], [facility]

## 2021-12-01 ENCOUNTER — Ambulatory Visit
Admission: RE | Admit: 2021-12-01 | Discharge: 2021-12-01 | Disposition: A | Discharge status: Home / Self Care | Payer: HMO | Source: Ambulatory Visit | Attending: Nurse Practitioner | Admitting: Nurse Practitioner

## 2021-12-01 DIAGNOSIS — Z1231 Encounter for screening mammogram for malignant neoplasm of breast: Secondary | ICD-10-CM | POA: Diagnosis not present

## 2021-12-06 ENCOUNTER — Inpatient Hospital Stay
Admission: RE | Admit: 2021-12-06 | Discharge: 2021-12-06 | Disposition: A | Discharge status: Home / Self Care | Payer: Self-pay | Source: Ambulatory Visit | Attending: *Deleted | Admitting: *Deleted

## 2021-12-06 ENCOUNTER — Other Ambulatory Visit: Payer: Self-pay | Admitting: *Deleted

## 2021-12-06 DIAGNOSIS — Z1231 Encounter for screening mammogram for malignant neoplasm of breast: Secondary | ICD-10-CM

## 2021-12-06 NOTE — Progress Notes (Signed)
Contacted via MyChart   Normal mammogram, may repeat in one year:)

## 2021-12-27 ENCOUNTER — Telehealth: Payer: HMO

## 2021-12-31 ENCOUNTER — Other Ambulatory Visit: Payer: Self-pay | Admitting: Nurse Practitioner

## 2021-12-31 DIAGNOSIS — Z794 Long term (current) use of insulin: Secondary | ICD-10-CM

## 2021-12-31 DIAGNOSIS — E1159 Type 2 diabetes mellitus with other circulatory complications: Secondary | ICD-10-CM

## 2021-12-31 DIAGNOSIS — E669 Obesity, unspecified: Secondary | ICD-10-CM

## 2021-12-31 DIAGNOSIS — E1169 Type 2 diabetes mellitus with other specified complication: Secondary | ICD-10-CM

## 2022-01-09 NOTE — Patient Instructions (Addendum)
Call April Johnson -- [phone] to get scheduled to talk about medication assistance.  Diabetes Mellitus Basics  Diabetes mellitus, or diabetes, is a long-term (chronic) disease. It occurs when the body does not properly use sugar (glucose) that is released from food after you eat. Diabetes mellitus may be caused by one or both of these problems: Your pancreas does not make enough of a hormone called insulin. Your body does not react in a normal way to the insulin that it makes. Insulin lets glucose enter cells in your body. This gives you energy. If you have diabetes, glucose cannot get into cells. This causes high blood glucose (hyperglycemia). How to treat and manage diabetes You may need to take insulin or other diabetes medicines daily to keep your glucose in balance. If you are prescribed insulin, you will learn how to give yourself insulin by injection. You may need to adjust the amount of insulin you take based on the foods that you eat. You will need to check your blood glucose levels using a glucose monitor as told by your health care provider. The readings can help determine if you have low or high blood glucose. Generally, you should have these blood glucose levels: Before meals (preprandial): 80-130 mg/dL (4.4-7.2 mmol/L). After meals (postprandial): below 180 mg/dL (10 mmol/L). Hemoglobin A1c (HbA1c) level: less than 7%. Your health care provider will set treatment goals for you. Keep all follow-up visits. This is important. Follow these instructions at home: Diabetes medicines Take your diabetes medicines every day as told by your health care provider. List your diabetes medicines here: Name of medicine: ______________________________ Amount (dose): _______________ Time (a.m./p.m.): _______________ Notes: ___________________________________ Name of medicine: ______________________________ Amount (dose): _______________ Time (a.m./p.m.): _______________ Notes:  ___________________________________ Name of medicine: ______________________________ Amount (dose): _______________ Time (a.m./p.m.): _______________ Notes: ___________________________________ Insulin If you use insulin, list the types of insulin you use here: Insulin type: ______________________________ Amount (dose): _______________ Time (a.m./p.m.): _______________Notes: ___________________________________ Insulin type: ______________________________ Amount (dose): _______________ Time (a.m./p.m.): _______________ Notes: ___________________________________ Insulin type: ______________________________ Amount (dose): _______________ Time (a.m./p.m.): _______________ Notes: ___________________________________ Insulin type: ______________________________ Amount (dose): _______________ Time (a.m./p.m.): _______________ Notes: ___________________________________ Insulin type: ______________________________ Amount (dose): _______________ Time (a.m./p.m.): _______________ Notes: ___________________________________ Managing blood glucose  Check your blood glucose levels using a glucose monitor as told by your health care provider. Write down the times that you check your glucose levels here: Time: _______________ Notes: ___________________________________ Time: _______________ Notes: ___________________________________ Time: _______________ Notes: ___________________________________ Time: _______________ Notes: ___________________________________ Time: _______________ Notes: ___________________________________ Time: _______________ Notes: ___________________________________  Low blood glucose Low blood glucose (hypoglycemia) is when glucose is at or below 70 mg/dL (3.9 mmol/L). Symptoms may include: Feeling: Hungry. Sweaty and clammy. Irritable or easily upset. Dizzy. Sleepy. Having: A fast heartbeat. A headache. A change in your vision. Numbness around the mouth, lips, or  tongue. Having trouble with: Moving (coordination). Sleeping. Treating low blood glucose To treat low blood glucose, eat or drink something containing sugar right away. If you can think clearly and swallow safely, follow the 15:15 rule: Take 15 grams of a fast-acting carb (carbohydrate), as told by your health care provider. Some fast-acting carbs are: Glucose tablets: take 3-4 tablets. Hard candy: eat 3-5 pieces. Fruit juice: drink 4 oz (120 mL). Regular (not diet) soda: drink 4-6 oz (120-180 mL). Honey or sugar: eat 1 Tbsp (15 mL). Check your blood glucose levels 15 minutes after you take the carb. If your glucose is still at or below 70 mg/dL (3.9 mmol/L), take 15 grams of a  carb again. If your glucose does not go above 70 mg/dL (3.9 mmol/L) after 3 tries, get help right away. After your glucose goes back to normal, eat a meal or a snack within 1 hour. Treating very low blood glucose If your glucose is at or below 54 mg/dL (3 mmol/L), you have very low blood glucose (severe hypoglycemia). This is an emergency. Do not wait to see if the symptoms will go away. Get medical help right away. Call your local emergency services (911 in the [place] Do not drive yourself to the hospital. Questions to ask your health care provider Should I talk with a diabetes educator? What equipment will I need to care for myself at home? What diabetes medicines do I need? When should I take them? How often do I need to check my blood glucose levels? What number can I call if I have questions? When is my follow-up visit? Where can I find a support group for people with diabetes? Where to find more information American Diabetes Association: www.diabetes.org Association of Diabetes Care and Education Specialists: www.diabeteseducator.org Contact a health care provider if: Your blood glucose is at or above 240 mg/dL (13.3 mmol/L) for 2 days in a row. You have been sick or have had a fever for 2 days or more,  and you are not getting better. You have any of these problems for more than 6 hours: You cannot eat or drink. You feel nauseous. You vomit. You have diarrhea. Get help right away if: Your blood glucose is lower than 54 mg/dL (3 mmol/L). You get confused. You have trouble thinking clearly. You have trouble breathing. These symptoms may represent a serious problem that is an emergency. Do not wait to see if the symptoms will go away. Get medical help right away. Call your local emergency services (911 in the [place] Do not drive yourself to the hospital. Summary Diabetes mellitus is a chronic disease that occurs when the body does not properly use sugar (glucose) that is released from food after you eat. Take insulin and diabetes medicines as told. Check your blood glucose every day, as often as told. Keep all follow-up visits. This is important. This information is not intended to replace advice given to you by your health care provider. Make sure you discuss any questions you have with your health care provider. Document Revised: 06/25/2019 Document Reviewed: 06/25/2019 Elsevier Patient Education  [place].

## 2022-01-13 ENCOUNTER — Telehealth: Payer: Self-pay

## 2022-01-13 NOTE — Progress Notes (Signed)
  Chronic Care Management   Note  01/13/2022 Name: April Johnson MRN: [id number] DOB: April 18, 1955  April Johnson is a 66 y.o. year old female who is a primary care patient of Cannady, [name], NP. I reached out to April Johnson by phone today in response to a referral sent by April Johnson's PCP.  April Johnson was not successfully contacted today. A HIPAA compliant voice message was left requesting a return call.   Follow up plan: Additional outreach attempts will be made.  Noreene Larsson, [place], [place] [address] Direct Dial: [phone] [email or web address]

## 2022-01-14 ENCOUNTER — Ambulatory Visit (INDEPENDENT_AMBULATORY_CARE_PROVIDER_SITE_OTHER): Payer: HMO | Admitting: Nurse Practitioner

## 2022-01-14 ENCOUNTER — Encounter: Payer: Self-pay | Admitting: Nurse Practitioner

## 2022-01-14 VITALS — BP 136/70 | HR 61 | Temp 98.1°F | Ht 64.0 in | Wt 189.3 lb

## 2022-01-14 DIAGNOSIS — E1159 Type 2 diabetes mellitus with other circulatory complications: Secondary | ICD-10-CM | POA: Diagnosis not present

## 2022-01-14 DIAGNOSIS — E559 Vitamin D deficiency, unspecified: Secondary | ICD-10-CM | POA: Diagnosis not present

## 2022-01-14 DIAGNOSIS — I152 Hypertension secondary to endocrine disorders: Secondary | ICD-10-CM

## 2022-01-14 DIAGNOSIS — E1169 Type 2 diabetes mellitus with other specified complication: Secondary | ICD-10-CM | POA: Diagnosis not present

## 2022-01-14 DIAGNOSIS — Z780 Asymptomatic menopausal state: Secondary | ICD-10-CM | POA: Diagnosis not present

## 2022-01-14 DIAGNOSIS — E669 Obesity, unspecified: Secondary | ICD-10-CM | POA: Diagnosis not present

## 2022-01-14 DIAGNOSIS — Z23 Encounter for immunization: Secondary | ICD-10-CM

## 2022-01-14 DIAGNOSIS — E1142 Type 2 diabetes mellitus with diabetic polyneuropathy: Secondary | ICD-10-CM | POA: Diagnosis not present

## 2022-01-14 DIAGNOSIS — E785 Hyperlipidemia, unspecified: Secondary | ICD-10-CM | POA: Diagnosis not present

## 2022-01-14 DIAGNOSIS — F5104 Psychophysiologic insomnia: Secondary | ICD-10-CM | POA: Diagnosis not present

## 2022-01-14 DIAGNOSIS — Z794 Long term (current) use of insulin: Secondary | ICD-10-CM | POA: Diagnosis not present

## 2022-01-14 LAB — BAYER DCA HB A1C WAIVED: HB A1C (BAYER DCA - WAIVED): 8.8 % — ABNORMAL HIGH (ref 4.8–5.6)

## 2022-01-14 MED ORDER — SITAGLIPTIN PHOSPHATE 100 MG PO TABS: 100.0000 mg | ORAL_TABLET | Freq: Every day | ORAL | 5 refills | Status: DC

## 2022-01-14 NOTE — Assessment & Plan Note (Signed)
Chronic, ongoing.  Continue current medication regimen and adjust as needed based on labs. Lipid panel today. 

## 2022-01-14 NOTE — Assessment & Plan Note (Addendum)
Chronic, ongoing.  Initial BP elevated, recheck stable for her age. Continue current medication regimen at this time and adjust as needed.  Telmisartan offering kidney protection.  Recommend she check BP at least 3 times a week at home + focus on DASH diet.  Continue to monitor kidney function and if GFR < 45 consider reduction Metformin.  LABS: CMP, TSH.

## 2022-01-14 NOTE — Assessment & Plan Note (Signed)
Noted on past labs, is due for DEXA now is 49 -- ordered today and check Vit D level.

## 2022-01-14 NOTE — Assessment & Plan Note (Signed)
Refer to diabetes plan of care.

## 2022-01-14 NOTE — Assessment & Plan Note (Signed)
Due to grieving process, improved sleep at this time.  Will continue current medication regimen and adjust as needed.  At present Trazodone beneficial both to mood and sleep pattern.  Denies any SI/HI.  Recommend she continue current support group sittings.

## 2022-01-14 NOTE — Assessment & Plan Note (Signed)
Chronic, ongoing with A1c trending up to 8.8% today due to not taking [place] and Ozempic due to cost.  She has a CCM referral in place, provided her # to call back Museum/gallery conservator and schedule pharmacist visit.  Continue Metformin 1000 MG BID and Levemir, add on Januvia 100 MG daily, this may be more cost effective at this time.  Educated her on this medication and side effects.  Will see if can get assist for Ozempic and Wilder Glade which worked well for her, with goal of insulin reduction and possible discontinuation in future.  Return in 4 weeks.

## 2022-01-14 NOTE — Progress Notes (Signed)
BP 136/70 (BP Location: Left Arm, Patient Position: Sitting, Cuff Size: Large)   Pulse 61   Temp 98.1 F (36.7 C) (Oral)   Ht '5\' 4"'$  (1.626 m)   Wt 189 lb 4.8 oz (85.9 kg)   LMP  (LMP Unknown)   SpO2 96%   BMI 32.49 kg/m    Subjective:    [name], female    DOB: 07/02/1955, 66 y.o.   MRN: [id number]  HPI: April Johnson is a 66 y.o. female  Chief Complaint  Patient presents with   Diabetes    Patient declines having a recent Diabetic Eye Exam.    Hyperlipidemia   Hypertension   Mood   DIABETES A1c in August 7.3%.   Taking Levemir 60 units daily, Metformin 1000 MG BID.  Not taking Ozempic as was too expensive, cost $300 for month supply.  Not taking Wilder Glade, as wanted to charge her $137 for month supply. Hypoglycemic episodes:no Polydipsia/polyuria: no Visual disturbance: no Chest pain: no Paresthesias: no Glucose Monitoring: yes             Accucheck frequency: daily             Fasting glucose: 144 yesterday morning             Post prandial:             Evening: 210 yesterday afternoon             Before meals: Taking Insulin?: yes             Long acting insulin: 60 units every day             Short acting insulin: Blood Pressure Monitoring: occasionally Retinal Examination: Not up to Date -- needs to schedule Foot Exam: Up to Date Pneumovax: Up to Date Influenza: Up to Date Aspirin: yes    HYPERTENSION / HYPERLIPIDEMIA WITH CKD Continues on Amlodipine, ASA, Lipitor, Metoprolol, and Micardis.   Satisfied with current treatment? yes Duration of hypertension: chronic BP monitoring frequency: not checking BP range:  BP medication side effects: no Duration of hyperlipidemia: chronic Cholesterol medication side effects: no Cholesterol supplements: none Medication compliance: good compliance Aspirin: yes Recent stressors: no Recurrent headaches: no Visual changes: no Palpitations: no Dyspnea: no Chest pain: no Lower  extremity edema: no Dizzy/lightheaded: no   Continues to sleep well with Trazodone on board.     01/14/2022   10:21 AM 10/14/2021   11:09 AM 07/19/2021   10:37 AM 04/19/2021   11:02 AM 11/23/2020   11:23 AM  Depression screen PHQ 2/9  Decreased Interest '1 1 1 '$ 0 0  Down, Depressed, Hopeless 0 0 0 1 0  PHQ - 2 Score '1 1 1 1 '$ 0  Altered sleeping 0 1 2 0 1  Tired, decreased energy '1 1 1 1 2  '$ Change in appetite 1 0 0 0 0  Feeling bad or failure about yourself  0 0 0 0 0  Trouble concentrating 0 0 0 1 0  Moving slowly or fidgety/restless 0 0 0 0 0  Suicidal thoughts 0 0 0 0 0  PHQ-9 Score '3 3 4 3 3  '$ Difficult doing work/chores  Not difficult at all  Not difficult at all Not difficult at all       01/14/2022   10:22 AM 10/14/2021   11:09 AM 07/19/2021   10:37 AM 04/19/2021   11:02 AM  GAD 7 : Generalized Anxiety Score  Nervous,  Anxious, on Edge 0 0 0 1  Control/stop worrying 0 0 1 0  Worry too much - different things 0 1 0 1  Trouble relaxing 0 1 1 0  Restless 0 0 1 0  Easily annoyed or irritable '1 1 1 1  '$ Afraid - awful might happen 0 0 0 0  Total GAD 7 Score '1 3 4 3  '$ Anxiety Difficulty Not difficult at all Not difficult at all Not difficult at all Not difficult at all    Relevant past medical, surgical, family and social history reviewed and updated as indicated. Interim medical history since our last visit reviewed. Allergies and medications reviewed and updated.  Review of Systems  Constitutional:  Negative for activity change, appetite change, diaphoresis, fatigue and fever.  Respiratory:  Negative for cough, chest tightness and shortness of breath.   Cardiovascular:  Negative for chest pain, palpitations and leg swelling.  Gastrointestinal: Negative.   Endocrine: Negative for polydipsia, polyphagia and polyuria.  Musculoskeletal:  Positive for back pain.  Neurological: Negative.   Psychiatric/Behavioral: Negative.      Per HPI unless specifically indicated above      Objective:    BP 136/70 (BP Location: Left Arm, Patient Position: Sitting, Cuff Size: Large)   Pulse 61   Temp 98.1 F (36.7 C) (Oral)   Ht '5\' 4"'$  (1.626 m)   Wt 189 lb 4.8 oz (85.9 kg)   LMP  (LMP Unknown)   SpO2 96%   BMI 32.49 kg/m   Wt Readings from Last 3 Encounters:  01/14/22 189 lb 4.8 oz (85.9 kg)  10/14/21 183 lb 4.8 oz (83.1 kg)  07/19/21 185 lb (83.9 kg)    Physical Exam Vitals and nursing note reviewed.  Constitutional:      General: She is awake. She is not in acute distress.    Appearance: She is well-developed and well-groomed. She is obese. She is not ill-appearing.  HENT:     Head: Normocephalic.     Right Ear: Hearing normal.     Left Ear: Hearing normal.  Eyes:     General: Lids are normal.        Right eye: No discharge.        Left eye: No discharge.     Conjunctiva/sclera: Conjunctivae normal.     Pupils: Pupils are equal, round, and reactive to light.  Neck:     Thyroid: No thyromegaly.     Vascular: No carotid bruit.  Cardiovascular:     Rate and Rhythm: Normal rate and regular rhythm.     Heart sounds: Normal heart sounds. No murmur heard.    No gallop.  Pulmonary:     Effort: Pulmonary effort is normal.     Breath sounds: Normal breath sounds.  Abdominal:     General: Bowel sounds are normal.     Palpations: Abdomen is soft.  Musculoskeletal:     Cervical back: Normal range of motion and neck supple.     Right lower leg: No edema.     Left lower leg: No edema.  Skin:    General: Skin is warm and dry.  Neurological:     Mental Status: She is alert and oriented to person, place, and time.  Psychiatric:        Attention and Perception: Attention normal.        Mood and Affect: Mood normal.        Speech: Speech normal.        Behavior: Behavior  normal. Behavior is cooperative.        Thought Content: Thought content normal.    Results for orders placed or performed in visit on 01/14/22  Bayer DCA Hb A1c Waived  Result Value Ref  Range   HB A1C (BAYER DCA - WAIVED) 8.8 (H) 4.8 - 5.6 %      Assessment & Plan:   Problem List Items Addressed This Visit       Cardiovascular and Mediastinum   Hypertension associated with diabetes ([place])    Chronic, ongoing.  Initial BP elevated, recheck stable for her age. Continue current medication regimen at this time and adjust as needed.  Telmisartan offering kidney protection.  Recommend she check BP at least 3 times a week at home + focus on DASH diet.  Continue to monitor kidney function and if GFR < 45 consider reduction Metformin.  LABS: CMP, TSH.        Relevant Medications   sitaGLIPtin (JANUVIA) 100 MG tablet   Other Relevant Orders   Bayer DCA Hb A1c Waived (Completed)   TSH   Comprehensive metabolic panel     Endocrine   Diabetic peripheral neuropathy ([place])    Refer to diabetes with obesity plan of care.  Continue Gabapentin for neuropathy.  B12 level at goal.        Relevant Medications   sitaGLIPtin (JANUVIA) 100 MG tablet   Other Relevant Orders   Bayer DCA Hb A1c Waived (Completed)   Hyperlipidemia associated with type 2 diabetes mellitus (HCC)    Chronic, ongoing.  Continue current medication regimen and adjust as needed based on labs. Lipid panel today.      Relevant Medications   sitaGLIPtin (JANUVIA) 100 MG tablet   Other Relevant Orders   Bayer DCA Hb A1c Waived (Completed)   Lipid Panel w/o Chol/HDL Ratio   Comprehensive metabolic panel   Type 2 diabetes mellitus with obesity (HCC) - Primary    Chronic, ongoing with A1c trending up to 8.8% today due to not taking [place] and Ozempic due to cost.  She has a CCM referral in place, provided her # to call back Museum/gallery conservator and schedule pharmacist visit.  Continue Metformin 1000 MG BID and Levemir, add on Januvia 100 MG daily, this may be more cost effective at this time.  Educated her on this medication and side effects.  Will see if can get assist for Ozempic and Wilder Glade which worked well for her, with goal  of insulin reduction and possible discontinuation in future.  Return in 4 weeks.      Relevant Medications   sitaGLIPtin (JANUVIA) 100 MG tablet   Other Relevant Orders   Bayer DCA Hb A1c Waived (Completed)     Other   Insomnia    Due to grieving process, improved sleep at this time.  Will continue current medication regimen and adjust as needed.  At present Trazodone beneficial both to mood and sleep pattern.  Denies any SI/HI.  Recommend she continue current support group sittings.        Insulin long-term use ([place])    Refer to diabetes plan of care.      Relevant Orders   Bayer DCA Hb A1c Waived (Completed)   Vitamin D deficiency    Noted on past labs, is due for DEXA now is 10 -- ordered today and check Vit D level.      Relevant Orders   VITAMIN D 25 Hydroxy (Vit-D Deficiency, Fractures)   Other Visit Diagnoses  Postmenopausal estrogen deficiency       DEXA scan today   Relevant Orders   DG Bone Density   Flu vaccine need       Flu vaccine in office today.   Relevant Orders   Flu Vaccine QUAD High Dose(Fluad) (Completed)   Pneumococcal vaccination given       PCV20 in office today.   Relevant Orders   Pneumococcal conjugate vaccine 20-valent (Prevnar 20) (Completed)        Follow up plan: Return in about 4 weeks (around 02/11/2022) for T2DM -- added Januvia.

## 2022-01-14 NOTE — Assessment & Plan Note (Signed)
Refer to diabetes with obesity plan of care.  Continue Gabapentin for neuropathy.  B12 level at goal.

## 2022-01-15 LAB — COMPREHENSIVE METABOLIC PANEL
ALT: 22 IU/L (ref 0–32)
AST: 15 IU/L (ref 0–40)
Albumin/Globulin Ratio: 1.8 (ref 1.2–2.2)
Albumin: 4.2 g/dL (ref 3.9–4.9)
Alkaline Phosphatase: 78 IU/L (ref 44–121)
BUN/Creatinine Ratio: 24 (ref 12–28)
BUN: 16 mg/dL (ref 8–27)
Bilirubin Total: 0.5 mg/dL (ref 0.0–1.2)
CO2: 21 mmol/L (ref 20–29)
Calcium: 9.5 mg/dL (ref 8.7–10.3)
Chloride: 102 mmol/L (ref 96–106)
Creatinine, Ser: 0.68 mg/dL (ref 0.57–1.00)
Globulin, Total: 2.3 g/dL (ref 1.5–4.5)
Glucose: 197 mg/dL — ABNORMAL HIGH (ref 70–99)
Potassium: 4.3 mmol/L (ref 3.5–5.2)
Sodium: 138 mmol/L (ref 134–144)
Total Protein: 6.5 g/dL (ref 6.0–8.5)
eGFR: 97 mL/min/{1.73_m2} (ref >59)

## 2022-01-15 LAB — TSH: TSH: 2.14 u[IU]/mL (ref 0.450–4.500)

## 2022-01-15 LAB — LIPID PANEL W/O CHOL/HDL RATIO
Cholesterol, Total: 124 mg/dL (ref 100–199)
HDL: 50 mg/dL (ref >39)
LDL Chol Calc (NIH): 44 mg/dL (ref 0–99)
Triglycerides: 184 mg/dL — ABNORMAL HIGH (ref 0–149)
VLDL Cholesterol Cal: 30 mg/dL (ref 5–40)

## 2022-01-15 LAB — VITAMIN D 25 HYDROXY (VIT D DEFICIENCY, FRACTURES): Vit D, 25-Hydroxy: 32.1 ng/mL (ref 30.0–100.0)

## 2022-01-15 NOTE — Progress Notes (Signed)
Contacted via [place] evening Nitara, your labs have returned: - Cholesterol labs show at goal LDL, continue Atorvastatin as ordered. - Kidney function, creatinine and eGFR, remains normal, as is liver function, AST and ALT.  - Thyroid and Vitamin D level normal.  Have a great evening!!  Any questions? Keep being stellar!!  Thank you for allowing me to participate in your care.  I appreciate you. Kindest regards, Aleigha Gilani

## 2022-01-17 ENCOUNTER — Telehealth: Payer: Self-pay | Admitting: Nurse Practitioner

## 2022-01-17 NOTE — Telephone Encounter (Signed)
Spoke with patient and she states that she can not afford to get Januvia that you started on 11/10. Is there something different that she can take in its place.

## 2022-01-17 NOTE — Telephone Encounter (Signed)
Pt called and stated that [facility] didn't [facility] when she went to pick it up / she stated she thinks [name] was suppose to be changing her Januvia to [place] due to the cost / please advise and send to [id number] [place], [place] - [address]

## 2022-01-18 NOTE — Telephone Encounter (Signed)
Patient notified of [name]'s message.   °

## 2022-01-19 ENCOUNTER — Telehealth: Payer: Self-pay

## 2022-01-19 NOTE — Telephone Encounter (Signed)
   CCM RN Visit Note   01-19-2022 Name: SMRITI BARKOW MRN: [id number]      DOB: [birth year]  Subjective: April Johnson is a 66 y.o. year old female who is a primary care patient of Marnee Guarneri, NP. The patient was referred to the Chronic Care Management team for assistance with care management needs subsequent to provider initiation of CCM services and plan of care.      An unsuccessful telephone outreach was attempted today to contact the patient about Chronic Care Management needs.    Plan:A HIPAA compliant phone message was left for the patient providing contact information and requesting a return call.  Noreene Larsson RN, MSN, CCM RN Care Manager  Chronic Care Management Direct Number: [phone]

## 2022-01-20 ENCOUNTER — Telehealth: Payer: Self-pay

## 2022-01-20 ENCOUNTER — Ambulatory Visit (INDEPENDENT_AMBULATORY_CARE_PROVIDER_SITE_OTHER): Payer: HMO

## 2022-01-20 DIAGNOSIS — E1159 Type 2 diabetes mellitus with other circulatory complications: Secondary | ICD-10-CM

## 2022-01-20 DIAGNOSIS — E1169 Type 2 diabetes mellitus with other specified complication: Secondary | ICD-10-CM

## 2022-01-20 NOTE — Patient Instructions (Signed)
Please call the care guide team at [phone] if you need to cancel or reschedule your appointment.   If you are experiencing a Mental Health or [place] or need someone to talk to, please call the Suicide and Crisis Lifeline: 988 call the [place] National Suicide Prevention Lifeline: 404-536-6306 or TTY: 812-718-8855 TTY (647)685-1640) to talk to a trained counselor call 1-800-273-TALK (toll free, 24 hour hotline)   Following is a copy of your full provider care plan:   Goals Addressed             This Visit's Progress    CCM Expected Outcome:  Monitor, Self-Manage and Reduce Symptoms of Diabetes       Current Barriers:  Care Coordination needs related to cost constraints related to medications she is prescribed for management of blood sugars and keeping A1C in goal in a patient with DM Chronic Disease Management support and education needs related to effective management of DM Financial Constraints.  Difficulty obtaining medications  Lab Results  Component Value Date   HGBA1C 8.8 (H) 01/14/2022   The patient states when she is on Ozempic her A1C is down to 5.7  Planned Interventions: Provided education to patient about basic DM disease process; Reviewed medications with patient and discussed importance of medication adherence. The patient was doing well on Ozempic but cannot afford it with the health plan she has now. The patient states that she is switching health plans in January and her medications will be more cost effective. Will collaborate with the pharm D for other ideas. Also will collaborate with the pcp to see if the office has samples she can use to carry her through to get her to new coverage;        Reviewed prescribed diet with patient heart healthy/ADA diet. States she has cut out pastas and breads. She is mindful of her dietary intake. Education and support given ; Counseled on importance of regular laboratory monitoring as prescribed;         Discussed plans with patient for ongoing care management follow up and provided patient with direct contact information for care management team;      Provided patient with written educational materials related to hypo and hyperglycemia and importance of correct treatment;       Reviewed scheduled/upcoming provider appointments including: 02-14-2022 at 1040 am;         Advised patient, providing education and rationale, to check cbg when you have symptoms of low or high blood sugar and as directed   and record. Her blood sugars have been about 244. Education on the goal of fasting <130 and post prandial of <180.        call provider for findings outside established parameters;       Referral made to pharmacy team for assistance with cost constraints related to [place] and [place];       Review of patient status, including review of consultants reports, relevant laboratory and other test results, and medications completed;       Advised patient to discuss if there are samples available in the office at her upcoming office visit, questions and concerns about her DM with provider;      Screening for signs and symptoms of depression related to chronic disease state;        Assessed social determinant of health barriers;      Supplied education on the [place] program at [email or web address] for help with grief support after the loss of her husband,  Rusty last year in June. The patient is thankful she has a good support system from her family and friends. Reflective listening and support given.     Symptom Management: Take medications as prescribed   Attend all scheduled provider appointments Call provider office for new concerns or questions  call the Suicide and Crisis Lifeline: 988 call the [place] National Suicide Prevention Lifeline: 209-345-7727 or TTY: 585 230 9037 TTY [phone]) to talk to a trained counselor call 1-800-273-TALK (toll free, 24 hour hotline) if experiencing a Mental Health or  [place]  schedule appointment with eye doctor check feet daily for cuts, sores or redness trim toenails straight across manage portion size wash and dry feet carefully every day wear comfortable, cotton socks wear comfortable, well-fitting shoes  Follow Up Plan: Telephone follow up appointment with care management team member scheduled for: 03-02-2022 at 1145 am       CCM Expected Outcome:  Monitor, Self-Manage and Reduce Symptoms of: HLD       Current Barriers:  Chronic Disease Management support and education needs related to effective management of HLD Lab Results  Component Value Date   CHOL 124 01/14/2022   HDL 50 01/14/2022   LDLCALC 44 01/14/2022   TRIG 184 (H) 01/14/2022   CHOLHDL 3.2 01/24/2018     Planned Interventions: Provider established cholesterol goals reviewed; Counseled on importance of regular laboratory monitoring as prescribed; Provided HLD educational materials; Reviewed role and benefits of statin for ASCVD risk reduction; Discussed strategies to manage statin-induced myalgias; Reviewed importance of limiting foods high in cholesterol. The patient is monitoring her dietary intake and trying to eat healthy. Has eliminated certain foods in her diet; Reviewed exercise goals and target of 150 minutes per week; Screening for signs and symptoms of depression related to chronic disease state;  Assessed social determinant of health barriers;   Symptom Management: Take medications as prescribed   Attend all scheduled provider appointments Call provider office for new concerns or questions  call the Suicide and Crisis Lifeline: 988 call the [place] National Suicide Prevention Lifeline: 959-766-4362 or TTY: (812)521-3133 TTY (934)205-0232) to talk to a trained counselor call 1-800-273-TALK (toll free, 24 hour hotline) if experiencing a Mental Health or [place]  - call for medicine refill 2 or 3 days before it runs out - take all  medications exactly as prescribed - call doctor with any symptoms you believe are related to your medicine - call doctor when you experience any new symptoms - go to all doctor appointments as scheduled  Follow Up Plan: Telephone follow up appointment with care management team member scheduled for: 03-02-2022 at 1145 am       CCM Expected Outcome:  Monitor, Self-Manage, and Reduce Symptoms of Hypertension       Current Barriers:  Chronic Disease Management support and education needs related to effective management of HTN  Planned Interventions: Evaluation of current treatment plan related to hypertension self management and patient's adherence to plan as established by provider;   Provided education to patient re: stroke prevention, s/s of heart attack and stroke; Reviewed prescribed diet heart healthy/ADA diet  Reviewed medications with patient and discussed importance of compliance;  Discussed plans with patient for ongoing care management follow up and provided patient with direct contact information for care management team; Advised patient, providing education and rationale, to monitor blood pressure daily and record, calling PCP for findings outside established parameters;  Reviewed scheduled/upcoming provider appointments including: 03-02-2022 at 1145 am Advised patient to  discuss changes in blood pressures or heart health with provider; Provided education on prescribed diet heart healthy/ADA diet ;  Discussed complications of poorly controlled blood pressure such as heart disease, stroke, circulatory complications, vision complications, kidney impairment, sexual dysfunction;  Screening for signs and symptoms of depression related to chronic disease state;  Assessed social determinant of health barriers;   Symptom Management: Take medications as prescribed   Attend all scheduled provider appointments Call provider office for new concerns or questions  call the Suicide and Crisis  Lifeline: 988 call the [place] National Suicide Prevention Lifeline: 548-483-6587 or TTY: 2080372527 TTY (939) 188-6169) to talk to a trained counselor call 1-800-273-TALK (toll free, 24 hour hotline) if experiencing a Mental Health or [facility]  check blood pressure weekly learn about high blood pressure keep a blood pressure log take blood pressure log to all doctor appointments call doctor for signs and symptoms of high blood pressure develop an action plan for high blood pressure keep all doctor appointments take medications for blood pressure exactly as prescribed report new symptoms to your doctor  Follow Up Plan: Telephone follow up appointment with care management team member scheduled for: 03-02-2022 at 1145 am          Patient verbalizes understanding of instructions and care plan provided today and agrees to view in [place]. Active MyChart status and patient understanding of how to access instructions and care plan via MyChart confirmed with patient.     Telephone follow up appointment with care management team member scheduled for: 03-02-2022 at 1145 am

## 2022-01-20 NOTE — Chronic Care Management (AMB) (Signed)
Chronic Care Management   CCM RN Visit Note  01/20/2022 Name: April Johnson MRN: [id number] DOB: May 01, 1955  Subjective: April Johnson is a 66 y.o. year old female who is a primary care patient of Cannady, April Faster, NP. The patient was referred to the Chronic Care Management team for assistance with care management needs subsequent to provider initiation of CCM services and plan of care.    Today's Visit:  Engaged with patient by telephone for initial visit.     SDOH Interventions Today    Flowsheet Row Most Recent Value  SDOH Interventions   Food Insecurity Interventions Intervention Not Indicated, Other (Comment)  [goes to the food pantry]  Housing Interventions Intervention Not Indicated  Transportation Interventions Intervention Not Indicated  Utilities Interventions Intervention Not Indicated  Alcohol Usage Interventions Intervention Not Indicated (Score <7)  Financial Strain Interventions Other (Comment)  [the patient has switched insurance providers and will be with [facility] in January 2024. She states that she will be able to afford Ozempic then]  Physical Activity Interventions Intervention Not Indicated, Other (Comments)  [walks her dog 3 times a day for about 30 minutes each time]  Stress Interventions Other (Comment), Intervention Not Indicated  [knows about resources available and utilization of food banks, on limited income]         Goals Addressed             This Visit's Progress    CCM Expected Outcome:  Monitor, Self-Manage and Reduce Symptoms of Diabetes       Current Barriers:  Care Coordination needs related to cost constraints related to medications she is prescribed for management of blood sugars and keeping A1C in goal in a patient with DM Chronic Disease Management support and education needs related to effective management of DM Financial Constraints.  Difficulty obtaining medications  Lab Results  Component Value Date   HGBA1C 8.8 (H)  01/14/2022   The patient states when she is on Ozempic her A1C is down to 5.7  Planned Interventions: Provided education to patient about basic DM disease process; Reviewed medications with patient and discussed importance of medication adherence. The patient was doing well on Ozempic but cannot afford it with the health plan she has now. The patient states that she is switching health plans in January and her medications will be more cost effective. Will collaborate with the pharm D for other ideas. Also will collaborate with the pcp to see if the office has samples she can use to carry her through to get her to new coverage;        Reviewed prescribed diet with patient heart healthy/ADA diet. States she has cut out pastas and breads. She is mindful of her dietary intake. Education and support given ; Counseled on importance of regular laboratory monitoring as prescribed;        Discussed plans with patient for ongoing care management follow up and provided patient with direct contact information for care management team;      Provided patient with written educational materials related to hypo and hyperglycemia and importance of correct treatment;       Reviewed scheduled/upcoming provider appointments including: 02-14-2022 at 1040 am;         Advised patient, providing education and rationale, to check cbg when you have symptoms of low or high blood sugar and as directed   and record. Her blood sugars have been about 244. Education on the goal of fasting <130 and post prandial of <180.  call provider for findings outside established parameters;       Referral made to pharmacy team for assistance with cost constraints related to [place] and [place];       Review of patient status, including review of consultants reports, relevant laboratory and other test results, and medications completed;       Advised patient to discuss if there are samples available in the office at her upcoming office  visit, questions and concerns about her DM with provider;      Screening for signs and symptoms of depression related to chronic disease state;        Assessed social determinant of health barriers;      Supplied education on the [place] program at [email or web address] for help with grief support after the loss of her husband, April Johnson last year in June. The patient is thankful she has a good support system from her family and friends. Reflective listening and support given.     Symptom Management: Take medications as prescribed   Attend all scheduled provider appointments Call provider office for new concerns or questions  call the Suicide and Crisis Lifeline: 988 call the [place] National Suicide Prevention Lifeline: 678-104-6232 or TTY: (724) 142-0290 TTY (813) 500-1286) to talk to a trained counselor call 1-800-273-TALK (toll free, 24 hour hotline) if experiencing a Mental Health or [facility]  schedule appointment with eye doctor check feet daily for cuts, sores or redness trim toenails straight across manage portion size wash and dry feet carefully every day wear comfortable, cotton socks wear comfortable, well-fitting shoes  Follow Up Plan: Telephone follow up appointment with care management team member scheduled for: 03-02-2022 at 1145 am       CCM Expected Outcome:  Monitor, Self-Manage and Reduce Symptoms of: HLD       Current Barriers:  Chronic Disease Management support and education needs related to effective management of HLD Lab Results  Component Value Date   CHOL 124 01/14/2022   HDL 50 01/14/2022   LDLCALC 44 01/14/2022   TRIG 184 (H) 01/14/2022   CHOLHDL 3.2 01/24/2018     Planned Interventions: Provider established cholesterol goals reviewed; Counseled on importance of regular laboratory monitoring as prescribed; Provided HLD educational materials; Reviewed role and benefits of statin for ASCVD risk reduction; Discussed strategies to manage  statin-induced myalgias; Reviewed importance of limiting foods high in cholesterol. The patient is monitoring her dietary intake and trying to eat healthy. Has eliminated certain foods in her diet; Reviewed exercise goals and target of 150 minutes per week; Screening for signs and symptoms of depression related to chronic disease state;  Assessed social determinant of health barriers;   Symptom Management: Take medications as prescribed   Attend all scheduled provider appointments Call provider office for new concerns or questions  call the Suicide and Crisis Lifeline: 988 call the [place] National Suicide Prevention Lifeline: 331-869-9095 or TTY: 629-114-8723 TTY 8725887811) to talk to a trained counselor call 1-800-273-TALK (toll free, 24 hour hotline) if experiencing a Mental Health or [place]  - call for medicine refill 2 or 3 days before it runs out - take all medications exactly as prescribed - call doctor with any symptoms you believe are related to your medicine - call doctor when you experience any new symptoms - go to all doctor appointments as scheduled  Follow Up Plan: Telephone follow up appointment with care management team member scheduled for: 03-02-2022 at 1145 am       CCM Expected Outcome:  Monitor, Self-Manage, and Reduce Symptoms of Hypertension       Current Barriers:  Chronic Disease Management support and education needs related to effective management of HTN  Planned Interventions: Evaluation of current treatment plan related to hypertension self management and patient's adherence to plan as established by provider;   Provided education to patient re: stroke prevention, s/s of heart attack and stroke; Reviewed prescribed diet heart healthy/ADA diet  Reviewed medications with patient and discussed importance of compliance;  Discussed plans with patient for ongoing care management follow up and provided patient with direct contact information  for care management team; Advised patient, providing education and rationale, to monitor blood pressure daily and record, calling PCP for findings outside established parameters;  Reviewed scheduled/upcoming provider appointments including: 03-02-2022 at 1145 am Advised patient to discuss changes in blood pressures or heart health with provider; Provided education on prescribed diet heart healthy/ADA diet ;  Discussed complications of poorly controlled blood pressure such as heart disease, stroke, circulatory complications, vision complications, kidney impairment, sexual dysfunction;  Screening for signs and symptoms of depression related to chronic disease state;  Assessed social determinant of health barriers;   Symptom Management: Take medications as prescribed   Attend all scheduled provider appointments Call provider office for new concerns or questions  call the Suicide and Crisis Lifeline: 988 call the [place] National Suicide Prevention Lifeline: (249) 191-5232 or TTY: 334-660-7503 TTY 731-817-2409) to talk to a trained counselor call 1-800-273-TALK (toll free, 24 hour hotline) if experiencing a Mental Health or [place]  check blood pressure weekly learn about high blood pressure keep a blood pressure log take blood pressure log to all doctor appointments call doctor for signs and symptoms of high blood pressure develop an action plan for high blood pressure keep all doctor appointments take medications for blood pressure exactly as prescribed report new symptoms to your doctor  Follow Up Plan: Telephone follow up appointment with care management team member scheduled for: 03-02-2022 at 1145 am          Plan:Telephone follow up appointment with care management team member scheduled for:  03-02-2022 at 1145 am  Noreene Larsson RN, MSN, CCM RN Care Manager  Chronic Care Management Direct Number: [phone]

## 2022-01-20 NOTE — Telephone Encounter (Signed)
Paperwork has been faxed back to [employer] as requested by [employer]

## 2022-01-20 NOTE — Plan of Care (Signed)
Chronic Care Management Provider Comprehensive Care Plan    01/20/2022 Name: April Johnson MRN: [id number] DOB: 1955/06/11  Referral to Chronic Care Management (CCM) services was placed by Provider:  Marnee Guarneri, NP on Date: 12-31-2021.  Chronic Condition 1: HTN Provider Assessment and Plan  Hypertension associated with diabetes ([place])       Chronic, ongoing.  Initial BP elevated, recheck stable for her age. Continue current medication regimen at this time and adjust as needed.  Telmisartan offering kidney protection.  Recommend she check BP at least 3 times a week at home + focus on DASH diet.  Continue to monitor kidney function and if GFR < 45 consider reduction Metformin.  LABS: CMP, TSH.          Relevant Medications    sitaGLIPtin (JANUVIA) 100 MG tablet    Other Relevant Orders    Bayer DCA Hb A1c Waived (Completed)    TSH    Comprehensive metabolic panel     Expected Outcome/Goals Addressed This Visit (Provider CCM goals/Provider Assessment and plan   Goal: CCM (HYPERTENSION)  EXPECTED OUTCOME:  MONITOR,SELF- MANAGE AND REDUCE SYMPTOMS OF HYPERTENSION    Symptom Management Condition 1: Take all medications as prescribed Attend all scheduled provider appointments Call provider office for new concerns or questions  call the Suicide and Crisis Lifeline: 988 call the [place] National Suicide Prevention Lifeline: 620-554-5177 or TTY: 586-043-7990 TTY 562 519 7785) to talk to a trained counselor call 1-800-273-TALK (toll free, 24 hour hotline) if experiencing a [place] or [place]  check blood pressure weekly learn about high blood pressure keep a blood pressure log take blood pressure log to all doctor appointments call doctor for signs and symptoms of high blood pressure keep all doctor appointments take medications for blood pressure exactly as prescribed report new symptoms to your doctor  Chronic Condition 2: DM Provider Assessment  and Plan  Chronic, ongoing with A1c trending up to 8.8% today due to not taking [place] and Ozempic due to cost.  She has a CCM referral in place, provided her # to call back Museum/gallery conservator and schedule pharmacist visit.  Continue Metformin 1000 MG BID and Levemir, add on Januvia 100 MG daily, this may be more cost effective at this time.  Educated her on this medication and side effects.  Will see if can get assist for Ozempic and [place] Glade which worked well for her, with goal of insulin reduction and possible discontinuation in future.  Return in 4 weeks.         Relevant Medications    sitaGLIPtin (JANUVIA) 100 MG tablet    Other Relevant Orders    Bayer DCA Hb A1c Waived (Completed)         Expected Outcome/Goals Addressed This Visit (Provider CCM goals/Provider Assessment and plan   CCM (DIABETES) EXPECTED OUTCOME: MONITOR, SELF-MANAGE AND REDUCE SYMPTOMS OF DIABETES   Symptom Management Condition 2: Take all medications as prescribed Attend all scheduled provider appointments Call provider office for new concerns or questions  call the Suicide and Crisis Lifeline: 988 call the [place] National Suicide Prevention Lifeline: 973-468-6546 or TTY: 337-630-0489 [place] (416)282-2579) to talk to a trained counselor call 1-800-273-TALK (toll free, 24 hour hotline) if experiencing a Mental Health or [place]  check feet daily for cuts, sores or redness trim toenails straight across manage portion size wash and dry feet carefully every day wear comfortable, cotton socks wear comfortable, well-fitting shoes  Chronic Condition 3: HLD Provider Assessment and  Plan  Chronic, ongoing.  Continue current medication regimen and adjust as needed based on labs. Lipid panel today.         Relevant Medications    sitaGLIPtin (JANUVIA) 100 MG tablet    Other Relevant Orders    Bayer DCA Hb A1c Waived (Completed)    Lipid Panel w/o Chol/HDL Ratio    Comprehensive metabolic panel     Expected  Outcome/Goals Addressed This Visit (Provider CCM goals/Provider Assessment and plan   Goal: CCM (HLD)  EXPECTED OUTCOME:  MONITOR, SELF- MANAGE AND REDUCE SYMPTOMS OF HLD   Symptom Management Condition 3: Take all medications as prescribed Attend all scheduled provider appointments Call provider office for new concerns or questions  call the Suicide and Crisis Lifeline: 988 call the [place] National Suicide Prevention Lifeline: 484-187-6673 or TTY: 343-025-7566 TTY 7633713919) to talk to a trained counselor call 1-800-273-TALK (toll free, 24 hour hotline) if experiencing a Mental Health or [place]  take all medications exactly as prescribed call doctor with any symptoms you believe are related to your medicine call doctor when you experience any new symptoms go to all doctor appointments as scheduled  Problem List Patient Active Problem List   Diagnosis Date Noted   Thoracic back pain 10/14/2021   Vitamin D deficiency 04/20/2021   Personal history of colonic polyps    Polyp of descending colon    Eczema 04/04/2017   Diabetic peripheral neuropathy ([place]) 09/27/2016   Hypertension associated with diabetes ([place]) 10/27/2015   Allergic rhinitis 09/16/2014   Type 2 diabetes mellitus with obesity ([place]) 09/16/2014   Hyperlipidemia associated with type 2 diabetes mellitus ([place]) 09/16/2014   Insulin long-term use ([place]) 09/16/2014   Esophagitis 09/16/2014   Insomnia 09/16/2014    Medication Management  Current Outpatient Medications:    amLODipine (NORVASC) 5 MG tablet, TAKE 1 TABLET (5 MG TOTAL) BY MOUTH DAILY., Disp: 90 tablet, Rfl: 4   aspirin 81 MG tablet, Take 81 mg by mouth daily. am, Disp: , Rfl:    atorvastatin (LIPITOR) 40 MG tablet, Take 1 tablet (40 mg total) by mouth daily., Disp: 90 tablet, Rfl: 3   Blood Glucose Monitoring Suppl (ONETOUCH VERIO) w/Device KIT, Use glucometer to check blood sugar up to 3 times a day., Disp: 1 kit, Rfl: 1   gabapentin  (NEURONTIN) 300 MG capsule, Take 1 capsule (300 mg total) by mouth 2 (two) times daily., Disp: 180 capsule, Rfl: 4   glucose blood test strip, Use as instructed, Disp: 100 each, Rfl: 12   Insulin Pen Needle (B-D ULTRAFINE III SHORT PEN) 31G X 8 MM MISC, USE AS DIRECTED, Disp: 100 each, Rfl: 12   Lancets (ONETOUCH ULTRASOFT) lancets, Use as instructed, Disp: 100 each, Rfl: 12   LEVEMIR FLEXTOUCH 100 UNIT/ML FlexTouch Pen, INJECT 60 UNITS INTO THE SKIN AT BEDTIME., Disp: 15 mL, Rfl: 4   lidocaine (LIDODERM) 5 %, Place 1 patch onto the skin daily. Remove & Discard patch within 12 hours or as directed by MD, Disp: 30 patch, Rfl: 0   meloxicam (MOBIC) 7.5 MG tablet, Take 1 tablet (7.5 mg total) by mouth daily., Disp: 90 tablet, Rfl: 4   metFORMIN (GLUCOPHAGE) 500 MG tablet, TAKE 2 TABLETS (1,000 MG TOTAL) BY MOUTH 2 (TWO) TIMES DAILY WITH A MEAL., Disp: 360 tablet, Rfl: 4   metoprolol succinate (TOPROL-XL) 100 MG 24 hr tablet, TAKE 1 TABLET BY MOUTH DAILY. TAKE WITH OR IMMEDIATELY FOLLOWING A MEAL., Disp: 90 tablet, Rfl: 4  Multiple Vitamins-Minerals (MULTIVITAMIN ADULT) CHEW, Chew by mouth daily., Disp: , Rfl:    nystatin (MYCOSTATIN/NYSTOP) powder, Apply 1 application topically 2 (two) times daily as needed (yeast)., Disp: 45 g, Rfl: 3   omeprazole (PRILOSEC) 20 MG capsule, TAKE 1 CAPSULE BY MOUTH EVERY DAY, Disp: 90 capsule, Rfl: 4   ondansetron (ZOFRAN) 4 MG tablet, TAKE 1 TABLET BY MOUTH EVERY 8 HOURS AS NEEDED FOR NAUSEA AND VOMITING, Disp: 21 tablet, Rfl: 8   sitaGLIPtin (JANUVIA) 100 MG tablet, Take 1 tablet (100 mg total) by mouth daily., Disp: 30 tablet, Rfl: 5   telmisartan (MICARDIS) 80 MG tablet, TAKE 1 TABLET BY MOUTH EVERY DAY, Disp: 90 tablet, Rfl: 4   tiZANidine (ZANAFLEX) 4 MG tablet, TAKE 1 TABLET BY MOUTH EVERY 6 HOURS AS NEEDED FOR MUSCLE SPASMS., Disp: 45 tablet, Rfl: 0   traZODone (DESYREL) 50 MG tablet, TAKE 1/2 TO 1 TABLET BY MOUTH AT BEDTIME AS NEEDED FOR SLEEP, Disp: 90 tablet,  Rfl: 1  Cognitive Assessment Identity Confirmed: : Name; DOB Cognitive Status: Normal   Functional Assessment Hearing Difficulty or Deaf: no Wear Glasses or Blind: yes Vision Management: wears glass, is having eye exam next month Concentrating, Remembering or Making Decisions Difficulty (CP): no Difficulty Communicating: no Difficulty Eating/Swallowing: no Walking or Climbing Stairs Difficulty: no Dressing/Bathing Difficulty: no Doing Errands Independently Difficulty (such as shopping) (CP): no Change in Functional Status Since Onset of Current Illness/Injury: no   Caregiver Assessment  Primary Source of Support/Comfort: sibling(s) Name of Support/Comfort Primary Source: Jenny Reichmann and other sister People in Home: alone Family Caregiver if Needed: none Primary Roles/Responsibilities: wage earner, part-time (does alterations and sewing) Expected Impact of Illness/Hospitalization: wants to be able to afford medications   Planned Interventions  Provided education to patient about basic DM disease process; Reviewed medications with patient and discussed importance of medication adherence. The patient was doing well on Ozempic but cannot afford it with the health plan she has now. The patient states that she is switching health plans in January and her medications will be more cost effective. Will collaborate with the pharm D for other ideas. Also will collaborate with the pcp to see if the office has samples she can use to carry her through to get her to new coverage;        Reviewed prescribed diet with patient heart healthy/ADA diet. States she has cut out pastas and breads. She is mindful of her dietary intake. Education and support given ; Counseled on importance of regular laboratory monitoring as prescribed;        Discussed plans with patient for ongoing care management follow up and provided patient with direct contact information for care management team;      Provided patient with  written educational materials related to hypo and hyperglycemia and importance of correct treatment;       Reviewed scheduled/upcoming provider appointments including: 02-14-2022 at 1040 am;         Advised patient, providing education and rationale, to check cbg when you have symptoms of low or high blood sugar and as directed   and record. Her blood sugars have been about 244. Education on the goal of fasting <130 and post prandial of <180.        call provider for findings outside established parameters;       Referral made to pharmacy team for assistance with cost constraints related to [place] and [place];       Review of patient status, including review  of consultants reports, relevant laboratory and other test results, and medications completed;       Advised patient to discuss if there are samples available in the office at her upcoming office visit, questions and concerns about her DM with provider;      Screening for signs and symptoms of depression related to chronic disease state;        Assessed social determinant of health barriers;      Supplied education on the [place] program at [email or web address] for help with grief support after the loss of her husband, [name] last year in June. The patient is thankful she has a good support system from her family and friends. Reflective listening and support given.    Provider established cholesterol goals reviewed; Counseled on importance of regular laboratory monitoring as prescribed; Provided HLD educational materials; Reviewed role and benefits of statin for ASCVD risk reduction; Discussed strategies to manage statin-induced myalgias; Reviewed importance of limiting foods high in cholesterol. The patient is monitoring her dietary intake and trying to eat healthy. Has eliminated certain foods in her diet; Reviewed exercise goals and target of 150 minutes per week; Screening for signs and symptoms of depression related to chronic disease state;   Assessed social determinant of health barriers Evaluation of current treatment plan related to hypertension self management and patient's adherence to plan as established by provider;   Provided education to patient re: stroke prevention, s/s of heart attack and stroke; Reviewed prescribed diet heart healthy/ADA diet  Reviewed medications with patient and discussed importance of compliance;  Discussed plans with patient for ongoing care management follow up and provided patient with direct contact information for care management team; Advised patient, providing education and rationale, to monitor blood pressure daily and record, calling PCP for findings outside established parameters;  Reviewed scheduled/upcoming provider appointments including: 03-02-2022 at 1145 am Advised patient to discuss changes in blood pressures or heart health with provider; Provided education on prescribed diet heart healthy/ADA diet ;  Discussed complications of poorly controlled blood pressure such as heart disease, stroke, circulatory complications, vision complications, kidney impairment, sexual dysfunction;  Screening for signs and symptoms of depression related to chronic disease state;  Assessed social determinant of health barriers  Interaction and coordination with outside resources, practitioners, and providers See CCM Referral  Care Plan: Available in MyChart

## 2022-01-24 ENCOUNTER — Telehealth: Payer: Self-pay

## 2022-01-24 NOTE — Progress Notes (Signed)
    Chronic Care Management Pharmacy Assistant   Name: April Johnson  MRN: [id number] DOB: 06-04-1955  Patient assistance application for Ozempic will be completed on behalf of the patient, and mailed out to her after December 1st.  Patient aware she will need to provide requested income verification to be sent the manufacturer.  Also patient agreed to a telephone appointment with clinical pharmacist on 01/31/22 at 3:30 pm.  [place] [phone]

## 2022-01-31 ENCOUNTER — Ambulatory Visit: Payer: HMO

## 2022-01-31 NOTE — Patient Instructions (Signed)
Visit Information   Goals Addressed   None    Patient Care Plan: CCM Pharmacy Care Plan     Problem Identified: Disease State Management   Priority: High  Onset Date: 01/31/2022     Long-Range Goal: Lipids, DM, HTN   Start Date: 01/31/2022  Expected End Date: 01/31/2023  This Visit's Progress: On track  Priority: High  Note:   Current Barriers:  Unable to independently afford treatment regimen  Pharmacist Clinical Goal(s):  Patient will contact provider office for questions/concerns as evidenced notation of same in electronic health record through collaboration with PharmD and provider.   Interventions: 1:1 collaboration with Venita Lick, NP regarding development and update of comprehensive plan of care as evidenced by provider attestation and co-signature Inter-disciplinary care team collaboration (see longitudinal plan of care) Comprehensive medication review performed; medication list updated in electronic medical record  Hypertension (BP goal <140/90) BP Readings from Last 3 Encounters:  01/14/22 136/70  10/14/21 128/86  07/19/21 138/80  -Controlled -Current treatment: Amlodipine 35m Appropriate, Effective, Safe, Accessible Metoprolol Succ 101mAppropriate, Effective, Safe, Accessible Telmisartan 8019mppropriate, Effective, Safe, Accessible -Medications previously tried: N/A  -Current home readings: Doesn't test -Current dietary habits: "Tries to eat healthy" -Current exercise habits: Works part time -Denies hypotensive/hypertensive symptoms -Educated on BP goals and benefits of medications for prevention of heart attack, stroke and kidney damage; -Counseled to monitor BP at home weekly, document, and provide log at future appointments -Counseled on diet and exercise extensively Recommended to continue current medication  Hyperlipidemia: (LDL goal < 70) The ASCVD Risk score (Arnett DK, et al., 2019) failed to calculate for the following reasons:   The  valid total cholesterol range is 130 to 320 mg/dL Lab Results  Component Value Date   CHOL 124 01/14/2022   CHOL 194 10/14/2021   CHOL 221 (H) 04/19/2021   Lab Results  Component Value Date   HDL 50 01/14/2022   HDL 46 10/14/2021   HDL 47 04/19/2021   Lab Results  Component Value Date   LDLCALC 44 01/14/2022   LDLCALC 100 (H) 10/14/2021   LDLCALC 115 (H) 04/19/2021   Lab Results  Component Value Date   TRIG 184 (H) 01/14/2022   TRIG 283 (H) 10/14/2021   TRIG 343 (H) 04/19/2021   Lab Results  Component Value Date   CHOLHDL 3.2 01/24/2018  No results found for: "LDLDIRECT" Last vitamin D Lab Results  Component Value Date   VD25OH 32.1 01/14/2022   Lab Results  Component Value Date   TSH 2.140 01/14/2022  -Controlled -Current treatment: Atorvastatin 51m50mpropriate, Effective, Safe, Accessible -Medications previously tried: N/A  -Current dietary patterns: "Tries to eat healthy" -Current exercise habits: N/A -Educated on Cholesterol goals;  -Recommended to continue current medication  Diabetes (A1c goal <8%) Lab Results  Component Value Date   HGBA1C 8.8 (H) 01/14/2022   HGBA1C 7.3 (H) 10/14/2021   HGBA1C 7.4 (H) 07/19/2021   Lab Results  Component Value Date   MICROALBUR 10 04/19/2021   LDLCALC 44 01/14/2022   CREATININE 0.68 01/14/2022   Lab Results  Component Value Date   NA 138 01/14/2022   K 4.3 01/14/2022   CREATININE 0.68 01/14/2022   EGFR 97 01/14/2022   GFRNONAA 82 04/01/2020   GLUCOSE 197 (H) 01/14/2022   Lab Results  Component Value Date   WBC 6.9 04/19/2021   HGB 14.2 04/19/2021   HCT 43.3 04/19/2021   MCV 79 04/19/2021   PLT 291 04/19/2021  Lab Results  Component Value Date   LABMICR See below: 06/19/2018   LABMICR See below: 04/14/2017   MICROALBUR 10 04/19/2021   MICROALBUR 30 (H) 04/01/2020  -Uncontrolled -Current medications: Ozempic 24m Appropriate, Effective, Safe, Query accessible Patient knows not to take this  with Sitagliptin Metformin 5024m2BID Appropriate, Effective, Safe, Accessible Sitagliptin 10061mppropriate, Effective, Safe, Query accessible Levemir 60 units Appropriate, Effective, Safe, Query accessible -Medications previously tried: [place] home glucose readings fasting glucose:  November 2023: Didn't have list on her -Denies hypoglycemic/hyperglycemic symptoms -Educated on A1c and blood sugar goals; -Counseled to check feet daily and get yearly eye exams November 2023: Will get Ozempic PAP sent out along with Levemir. Counseled patient extensively to stop Sitagliptin once start back on Ozempic  Patient Goals/Self-Care Activities Patient will:  - take medications as prescribed as evidenced by patient report and record review  Follow Up Plan: The patient has been provided with contact information for the care management team and has been advised to call with any health related questions or concerns.   CPP F/U May 2024  [place] [facility]stable[facility]Sherian Rein - [phone]     Ms. MitWindishs given information about Chronic Care Management services today including:  CCM service includes personalized support from designated clinical staff supervised by her physician, including individualized plan of care and coordination with other care providers 24/7 contact phone numbers for assistance for urgent and routine care needs. Standard insurance, coinsurance, copays and deductibles apply for chronic care management only during months in which we provide at least 20 minutes of these services. Most insurances cover these services at 100%, however patients may be responsible for any copay, coinsurance and/or deductible if applicable. This service may help you avoid the need for more expensive face-to-face services. Only one practitioner may furnish and bill the service in a calendar month. The patient may stop CCM services at any time (effective at the end of the month) by phone call to  the office staff.  Patient agreed to services and verbal consent obtained.   The patient verbalized understanding of instructions, educational materials, and care plan provided today and DECLINED offer to receive copy of patient instructions, educational materials, and care plan.  The pharmacy team will reach out to the patient again over the next 60 days.   [facility]

## 2022-01-31 NOTE — Progress Notes (Signed)
Chronic Care Management Pharmacy Note  01/31/2022 Name:  April Johnson MRN:  [id number] DOB:  10-28-1955  Summary: -Pleasant 66 year old female presents for initial CCM visit. Her husband passed in June 2022. He was a Administrator and she drove with him briefly years ago. She has a dog named Maybene and a Geophysicist/field seismologist (Was named Vikki Ports but when they saw she was a he they named him Haze Boyden instead)  Recommendations/Changes made from today's visit: -Ozempic and Insulin PAP  -Counseled to call in December to see if she was approved -Counseled patient to stop Sitagliptin once she gets Ozempic   Subjective: April Johnson is an 66 y.o. year old female who is a primary patient of Cannady, [name], NP.  The CCM team was consulted for assistance with disease management and care coordination needs.    Engaged with patient by telephone for initial visit in response to provider referral for pharmacy case management and/or care coordination services.   Consent to Services:  The patient was given the following information about Chronic Care Management services today, agreed to services, and gave verbal consent: 1. CCM service includes personalized support from designated clinical staff supervised by the primary care provider, including individualized plan of care and coordination with other care providers 2. 24/7 contact phone numbers for assistance for urgent and routine care needs. 3. Service will only be billed when office clinical staff spend 20 minutes or more in a month to coordinate care. 4. Only one practitioner may furnish and bill the service in a calendar month. 5.The patient may stop CCM services at any time (effective at the end of the month) by phone call to the office staff. 6. The patient will be responsible for cost sharing (co-pay) of up to 20% of the service fee (after annual deductible is met). Patient agreed to services and consent obtained.  Patient Care Team: Venita Lick, NP as PCP - General (Nurse Practitioner) Lane Hacker, [facility] (Pharmacist) Vanita Ingles, RN as Case Manager (General Practice)    Objective:  Lab Results  Component Value Date   CREATININE 0.68 01/14/2022   BUN 16 01/14/2022   EGFR 97 01/14/2022   GFRNONAA 82 04/01/2020   GFRAA 95 04/01/2020   NA 138 01/14/2022   K 4.3 01/14/2022   CALCIUM 9.5 01/14/2022   CO2 21 01/14/2022   GLUCOSE 197 (H) 01/14/2022    Lab Results  Component Value Date/Time   HGBA1C 8.8 (H) 01/14/2022 10:26 AM   HGBA1C 7.3 (H) 10/14/2021 11:42 AM   HGBA1C 7.3% 10/27/2015 12:00 AM   MICROALBUR 10 04/19/2021 10:42 AM   MICROALBUR 30 (H) 04/01/2020 10:48 AM    Last diabetic Eye exam:  Lab Results  Component Value Date/Time   HMDIABEYEEXA No Retinopathy 09/27/2017 12:00 AM    Last diabetic Foot exam: No results found for: "HMDIABFOOTEX"   Lab Results  Component Value Date   CHOL 124 01/14/2022   HDL 50 01/14/2022   LDLCALC 44 01/14/2022   TRIG 184 (H) 01/14/2022   CHOLHDL 3.2 01/24/2018       Latest Ref Rng & Units 01/14/2022   10:28 AM 10/14/2021   11:45 AM 04/19/2021   10:44 AM  Hepatic Function  Total Protein 6.0 - 8.5 g/dL 6.5  6.9  6.9   Albumin 3.9 - 4.9 g/dL 4.2  4.7  4.5   AST 0 - 40 IU/L _0 ALT 0 - 32  IU/L _0 Alk Phosphatase 44 - 121 IU/L 78  82  96   Total Bilirubin 0.0 - 1.2 mg/dL 0.5  0.3  0.5     Lab Results  Component Value Date/Time   TSH 2.140 01/14/2022 10:28 AM   TSH 2.130 04/19/2021 10:44 AM       Latest Ref Rng & Units 04/19/2021   10:44 AM 07/03/2020   10:08 AM 06/03/2019    2:24 PM  CBC  WBC 3.4 - 10.8 x10E3/uL 6.9  7.3  8.0   Hemoglobin 11.1 - 15.9 g/dL 14.2  15.1  14.5   Hematocrit 34.0 - 46.6 % 43.3  45.4  43.8   Platelets 150 - 450 x10E3/uL 291  322  345     Lab Results  Component Value Date/Time   VD25OH 32.1 01/14/2022 10:28 AM   VD25OH 22.3 (L) 04/19/2021 10:44 AM   VITAMINB12 471 06/03/2019 02:24 PM     Clinical ASCVD: Yes  The ASCVD Risk score (Arnett DK, et al., 2019) failed to calculate for the following reasons:   The valid total cholesterol range is 130 to 320 mg/dL       01/14/2022   10:21 AM 10/14/2021   11:09 AM 07/19/2021   10:37 AM  Depression screen PHQ 2/9  Decreased Interest _1 Down, Depressed, Hopeless 0 0 0  PHQ - 2 Score _2 Altered sleeping 0 1 2  Tired, decreased energy _3 Change in appetite 1 0 0  Feeling bad or failure about yourself  0 0 0  Trouble concentrating 0 0 0  Moving slowly or fidgety/restless 0 0 0  Suicidal thoughts 0 0 0  PHQ-9 Score _4 Difficult doing work/chores  Not difficult at all      Other: (CHADS2VASc if Afib, MMRC or CAT for COPD, ACT, DEXA)  Social History   Tobacco Use  Smoking Status Former   Packs/day: 0.25   Years: 10.00   Total pack years: 2.50   Types: Cigarettes  Smokeless Tobacco Never   BP Readings from Last 3 Encounters:  01/14/22 136/70  10/14/21 128/86  07/19/21 138/80   Pulse Readings from Last 3 Encounters:  01/14/22 61  10/14/21 69  07/19/21 65   Wt Readings from Last 3 Encounters:  01/14/22 189 lb 4.8 oz (85.9 kg)  10/14/21 183 lb 4.8 oz (83.1 kg)  07/19/21 185 lb (83.9 kg)   BMI Readings from Last 3 Encounters:  01/14/22 32.49 kg/m  10/14/21 31.46 kg/m  07/19/21 31.76 kg/m    Assessment/Interventions: Review of patient past medical history, allergies, medications, health status, including review of consultants reports, laboratory and other test data, was performed as part of comprehensive evaluation and provision of chronic care management services.   SDOH:  (Social Determinants of Health) assessments and interventions performed: Yes SDOH Interventions    Flowsheet Row Chronic Care Management from 01/31/2022 in [place] Management from 01/20/2022 in [place] Visit from 04/19/2021 in [place]  Interventions     Food Insecurity Interventions -- Intervention Not Indicated, Other (Comment)  [goes to the food pantry] --  Housing Interventions -- Intervention Not Indicated --  Transportation Interventions Intervention Not Indicated Intervention Not Indicated --  Utilities Interventions -- Intervention Not Indicated --  Alcohol Usage Interventions -- Intervention Not Indicated (Score <7) --  Depression Interventions/Treatment  -- -- Currently on Treatment  Financial Strain Interventions Other (Comment)  [PAP] Other (Comment)  [the patient has switched insurance providers and will be with [facility] in January 2024. She states that she will be able to afford Ozempic then] --  Physical Activity Interventions -- Intervention Not Indicated, Other (Comments)  [walks her dog 3 times a day for about 30 minutes each time] --  Stress Interventions -- Other (Comment), Intervention Not Indicated  [knows about resources available and utilization of food banks, on limited income] --      SDOH Screenings   Food Insecurity: No Food Insecurity (01/20/2022)  Housing: Low Risk  (01/20/2022)  Transportation Needs: No Transportation Needs (01/31/2022)  Utilities: Not At Risk (01/20/2022)  Alcohol Screen: Low Risk  (01/20/2022)  Depression (PHQ2-9): Low Risk  (01/14/2022)  Financial Resource Strain: High Risk (01/31/2022)  Physical Activity: Sufficiently Active (01/20/2022)  Social Connections: Unknown (01/20/2022)  Stress: No Stress Concern Present (01/20/2022)  Tobacco Use: Medium Risk (01/14/2022)    [place]  Allergies  Allergen Reactions   Niacin And Related Other (See Comments)    Made her feel as if she was "burning"/skin redness   Cyclobenzaprine Rash    Medications Reviewed Today     Reviewed by Lane Hacker, [facility] (Pharmacist) on 01/31/22 at [place] List Status: <None>   Medication Order Taking? Sig Documenting Provider Last Dose Status Informant  amLODipine (NORVASC) 5 MG  tablet [id number] No TAKE 1 TABLET (5 MG TOTAL) BY MOUTH DAILY. Marnee Guarneri T, NP Taking Active   aspirin 81 MG tablet [id number] No Take 81 mg by mouth daily. am [provider] Taking Active Self  atorvastatin (LIPITOR) 40 MG tablet [id number] No Take 1 tablet (40 mg total) by mouth daily. Marnee Guarneri T, NP Taking Active   Blood Glucose Monitoring Suppl (ONETOUCH VERIO) w/Device KIT [id number] No Use glucometer to check blood sugar up to 3 times a day. Marnee Guarneri T, NP Taking Active   gabapentin (NEURONTIN) 300 MG capsule [id number] No Take 1 capsule (300 mg total) by mouth 2 (two) times daily. Marnee Guarneri T, NP Taking Active   glucose blood test strip [id number] No Use as instructed Cannady, [name], NP Taking Active Self  Insulin Pen Needle (B-D ULTRAFINE III SHORT PEN) 31G X 8 MM MISC [id number] No USE AS DIRECTED Ned Card, [name], NP Taking Active   Lancets [facility] ULTRASOFT) lancets [id number] No Use as instructed Marnee Guarneri T, NP Taking Active   LEVEMIR FLEXTOUCH 100 UNIT/ML FlexTouch Pen [id number] No INJECT 60 UNITS INTO THE SKIN AT BEDTIME. Marnee Guarneri T, NP Taking Active   lidocaine (LIDODERM) 5 % [id number] No Place 1 patch onto the skin daily. Remove & Discard patch within 12 hours or as directed by MD Venita Lick, NP Taking Active   meloxicam (MOBIC) 7.5 MG tablet [id number] No Take 1 tablet (7.5 mg total) by mouth daily. Marnee Guarneri T, NP Taking Active   metFORMIN (GLUCOPHAGE) 500 MG tablet [id number] No TAKE 2 TABLETS (1,000 MG TOTAL) BY MOUTH 2 (TWO) TIMES DAILY WITH A MEAL. Marnee Guarneri T, NP Taking Active   metoprolol succinate (TOPROL-XL) 100 MG 24 hr tablet [id number] No TAKE 1 TABLET BY MOUTH DAILY. TAKE WITH OR IMMEDIATELY FOLLOWING A MEAL. Venita Lick, NP Taking Active   Multiple Vitamins-Minerals (MULTIVITAMIN ADULT) CHEW [id number] No Chew by mouth daily. [provider] Taking Active   nystatin (MYCOSTATIN/NYSTOP) powder  [id number] No Apply 1 application topically 2 (two) times daily as needed (yeast).  Marnee Guarneri T, NP Taking Active Self  omeprazole (PRILOSEC) 20 MG capsule [id number] No TAKE 1 CAPSULE BY MOUTH EVERY DAY Cannady, [name] T, NP Taking Active   ondansetron (ZOFRAN) 4 MG tablet [id number] No TAKE 1 TABLET BY MOUTH EVERY 8 HOURS AS NEEDED FOR NAUSEA AND VOMITING Cannady, [name] T, NP Taking Active   sitaGLIPtin (JANUVIA) 100 MG tablet [id number]  Take 1 tablet (100 mg total) by mouth daily. Marnee Guarneri T, NP  Active   telmisartan (MICARDIS) 80 MG tablet [id number] No TAKE 1 TABLET BY MOUTH EVERY DAY Cannady, [name] T, NP Taking Active   tiZANidine (ZANAFLEX) 4 MG tablet [id number] No TAKE 1 TABLET BY MOUTH EVERY 6 HOURS AS NEEDED FOR MUSCLE SPASMS. Marnee Guarneri T, NP Taking Active   traZODone (DESYREL) 50 MG tablet [id number] No TAKE 1/2 TO 1 TABLET BY MOUTH AT BEDTIME AS NEEDED FOR SLEEP Venita Lick, NP Taking Active             Patient Active Problem List   Diagnosis Date Noted   Thoracic back pain 10/14/2021   Vitamin D deficiency 04/20/2021   Personal history of colonic polyps    Polyp of descending colon    Eczema 04/04/2017   Diabetic peripheral neuropathy ([place]) 09/27/2016   Hypertension associated with diabetes ([place]) 10/27/2015   Allergic rhinitis 09/16/2014   Type 2 diabetes mellitus with obesity ([place]) 09/16/2014   Hyperlipidemia associated with type 2 diabetes mellitus ([place]) 09/16/2014   Insulin long-term use ([place]) 09/16/2014   Esophagitis 09/16/2014   Insomnia 09/16/2014    Immunization History  Administered Date(s) Administered   Fluad Quad(high Dose 65+) 01/14/2022   Influenza,inj,Quad PF,6+ Mos 12/24/2014, 02/10/2016, 12/28/2016, 10/24/2017, 10/31/2018, 12/31/2019   Influenza-Unspecified 12/24/2014, 02/10/2016, 12/28/2016, 10/24/2017, 10/31/2018   Moderna Sars-Covid-2 Vaccination 10/04/2019, 11/01/2019   PNEUMOCOCCAL CONJUGATE-20 01/14/2022   Pneumococcal  Polysaccharide-23 01/25/2005   Td 12/31/1999   Td (Adult), 2 Lf Tetanus Toxid, Preservative Free 12/31/1999   Tdap 05/11/2010, 07/25/2019    Conditions to be addressed/monitored:  Hypertension, Hyperlipidemia, and Diabetes  Care Plan : [place]  Updates made by Lane Hacker, [place] since 01/31/2022 12:00 AM     Problem: Disease State Management   Priority: High  Onset Date: 01/31/2022     Long-Range Goal: Lipids, DM, HTN   Start Date: 01/31/2022  Expected End Date: 01/31/2023  This Visit's Progress: On track  Priority: High  Note:   Current Barriers:  Unable to independently afford treatment regimen  Pharmacist Clinical Goal(s):  Patient will contact provider office for questions/concerns as evidenced notation of same in electronic health record through collaboration with PharmD and provider.   Interventions: 1:1 collaboration with Venita Lick, NP regarding development and update of comprehensive plan of care as evidenced by provider attestation and co-signature Inter-disciplinary care team collaboration (see longitudinal plan of care) Comprehensive medication review performed; medication list updated in electronic medical record  Hypertension (BP goal <140/90) BP Readings from Last 3 Encounters:  01/14/22 136/70  10/14/21 128/86  07/19/21 138/80  -Controlled -Current treatment: Amlodipine 54m Appropriate, Effective, Safe, Accessible Metoprolol Succ 1074mAppropriate, Effective, Safe, Accessible Telmisartan 807mppropriate, Effective, Safe, Accessible -Medications previously tried: N/A  -Current home readings: Doesn't test -Current dietary habits: "Tries to eat healthy" -Current exercise habits: Works part time -Denies hypotensive/hypertensive symptoms -Educated on BP goals and benefits of medications for prevention of heart attack, stroke and kidney damage; -Counseled to monitor BP at home weekly, document, and provide log at  future  appointments -Counseled on diet and exercise extensively Recommended to continue current medication  Hyperlipidemia: (LDL goal < 70) The ASCVD Risk score (Arnett DK, et al., 2019) failed to calculate for the following reasons:   The valid total cholesterol range is 130 to 320 mg/dL Lab Results  Component Value Date   CHOL 124 01/14/2022   CHOL 194 10/14/2021   CHOL 221 (H) 04/19/2021   Lab Results  Component Value Date   HDL 50 01/14/2022   HDL 46 10/14/2021   HDL 47 04/19/2021   Lab Results  Component Value Date   LDLCALC 44 01/14/2022   LDLCALC 100 (H) 10/14/2021   LDLCALC 115 (H) 04/19/2021   Lab Results  Component Value Date   TRIG 184 (H) 01/14/2022   TRIG 283 (H) 10/14/2021   TRIG 343 (H) 04/19/2021   Lab Results  Component Value Date   CHOLHDL 3.2 01/24/2018  No results found for: "LDLDIRECT" Last vitamin D Lab Results  Component Value Date   VD25OH 32.1 01/14/2022   Lab Results  Component Value Date   TSH 2.140 01/14/2022  -Controlled -Current treatment: Atorvastatin 76m Appropriate, Effective, Safe, Accessible -Medications previously tried: N/A  -Current dietary patterns: "Tries to eat healthy" -Current exercise habits: N/A -Educated on Cholesterol goals;  -Recommended to continue current medication  Diabetes (A1c goal <8%) Lab Results  Component Value Date   HGBA1C 8.8 (H) 01/14/2022   HGBA1C 7.3 (H) 10/14/2021   HGBA1C 7.4 (H) 07/19/2021   Lab Results  Component Value Date   MICROALBUR 10 04/19/2021   LDLCALC 44 01/14/2022   CREATININE 0.68 01/14/2022   Lab Results  Component Value Date   NA 138 01/14/2022   K 4.3 01/14/2022   CREATININE 0.68 01/14/2022   EGFR 97 01/14/2022   GFRNONAA 82 04/01/2020   GLUCOSE 197 (H) 01/14/2022   Lab Results  Component Value Date   WBC 6.9 04/19/2021   HGB 14.2 04/19/2021   HCT 43.3 04/19/2021   MCV 79 04/19/2021   PLT 291 04/19/2021   Lab Results  Component Value Date   LABMICR See below:  06/19/2018   LABMICR See below: 04/14/2017   MICROALBUR 10 04/19/2021   MICROALBUR 30 (H) 04/01/2020  -Uncontrolled -Current medications: Ozempic 245mAppropriate, Effective, Safe, Query accessible Patient knows not to take this with Sitagliptin Metformin 50063mBID Appropriate, Effective, Safe, Accessible Sitagliptin 100m24mpropriate, Effective, Safe, Query accessible Levemir 60 units Appropriate, Effective, Safe, Query accessible -Medications previously tried: FarxIranurrent home glucose readings fasting glucose:  November 2023: Didn't have list on her -Denies hypoglycemic/hyperglycemic symptoms -Educated on A1c and blood sugar goals; -Counseled to check feet daily and get yearly eye exams November 2023: Will get Ozempic PAP sent out along with Levemir. Counseled patient extensively to stop Sitagliptin once start back on Ozempic  Patient Goals/Self-Care Activities Patient will:  - take medications as prescribed as evidenced by patient report and record review  Follow Up Plan: The patient has been provided with contact information for the care management team and has been advised to call with any health related questions or concerns.   CPP F/U May 2024  [place] ConstablearSherian Rein- [phone]     Medication Assistance:  Ozempic: -PAP for 2024 started November 2023  Insulin: -PAP for 2024 started November 2023   [id number] [id number]: [phone]  [phone]  Uses pill box? No -   Pt endorses 100% compliance  We discussed: Current pharmacy is preferred with insurance plan and patient is satisfied with pharmacy services Patient decided to: Continue current medication management strategy  Care Plan and Follow Up Patient Decision:  Patient agrees to Care Plan and Follow-up.  Plan: The patient has been provided with contact information for the care  management team and has been advised to call with any health related questions or concerns.   CPP F/U May 2024  [place] Constable, Sherian Rein.D. - [phone]

## 2022-02-03 DIAGNOSIS — I10 Essential (primary) hypertension: Secondary | ICD-10-CM

## 2022-02-03 DIAGNOSIS — Z7985 Long-term (current) use of injectable non-insulin antidiabetic drugs: Secondary | ICD-10-CM

## 2022-02-03 DIAGNOSIS — E785 Hyperlipidemia, unspecified: Secondary | ICD-10-CM | POA: Diagnosis not present

## 2022-02-03 DIAGNOSIS — E1159 Type 2 diabetes mellitus with other circulatory complications: Secondary | ICD-10-CM | POA: Diagnosis not present

## 2022-02-11 ENCOUNTER — Other Ambulatory Visit: Payer: Self-pay | Admitting: Nurse Practitioner

## 2022-02-11 NOTE — Telephone Encounter (Signed)
Requested medication (s) are due for refill today - yes  Requested medication (s) are on the active medication list -yes  Future visit scheduled -yes  Last refill: 11/25/21 #45  Notes to clinic: non delegated Rx  Requested Prescriptions  Pending Prescriptions Disp Refills   tiZANidine (ZANAFLEX) 4 MG tablet [Pharmacy Med Name: tizanidine 4 mg tablet] 45 tablet 11    Sig: TAKE ONE TABLET BY MOUTH EVERY 6 HOURS AS NEEDED     Not Delegated - Cardiovascular:  Alpha-2 Agonists - tizanidine Failed - 02/11/2022  7:15 AM      Failed - This refill cannot be delegated      Passed - Valid encounter within last 6 months    Recent Outpatient Visits           4 weeks ago Type 2 diabetes mellitus with obesity ([place])   [place], [name] T, NP   4 months ago Type 2 diabetes mellitus with obesity ([place])   [place], [name] T, NP   6 months ago Type 2 diabetes mellitus with obesity ([place])   [place], [name] T, NP   9 months ago Type 2 diabetes mellitus with obesity ([place])   [place], [name], NP   1 year ago Psychophysiological insomnia   [place], [name], NP       Future Appointments             In 3 days Cannady, [name], NP [employer]               Requested Prescriptions  Pending Prescriptions Disp Refills   tiZANidine (ZANAFLEX) 4 MG tablet [Pharmacy Med Name: tizanidine 4 mg tablet] 45 tablet 11    Sig: TAKE ONE TABLET BY MOUTH EVERY 6 HOURS AS NEEDED     Not Delegated - Cardiovascular:  Alpha-2 Agonists - tizanidine Failed - 02/11/2022  7:15 AM      Failed - This refill cannot be delegated      Passed - Valid encounter within last 6 months    Recent Outpatient Visits           4 weeks ago Type 2 diabetes mellitus with obesity ([place])   [place], [name] T, NP   4 months ago Type 2 diabetes mellitus with obesity  ([place])   [place], [name] T, NP   6 months ago Type 2 diabetes mellitus with obesity ([place])   [place] Cannady, [name] T, NP   9 months ago Type 2 diabetes mellitus with obesity ([place])   [place], [name], NP   1 year ago Psychophysiological insomnia   [place], [name], NP       Future Appointments             In 3 days Cannady, [name], NP [employer], [facility]

## 2022-02-12 NOTE — Patient Instructions (Signed)
Diabetes Mellitus Basics  Diabetes mellitus, or diabetes, is a long-term (chronic) disease. It occurs when the body does not properly use sugar (glucose) that is released from food after you eat. Diabetes mellitus may be caused by one or both of these problems: Your pancreas does not make enough of a hormone called insulin. Your body does not react in a normal way to the insulin that it makes. Insulin lets glucose enter cells in your body. This gives you energy. If you have diabetes, glucose cannot get into cells. This causes high blood glucose (hyperglycemia). How to treat and manage diabetes You may need to take insulin or other diabetes medicines daily to keep your glucose in balance. If you are prescribed insulin, you will learn how to give yourself insulin by injection. You may need to adjust the amount of insulin you take based on the foods that you eat. You will need to check your blood glucose levels using a glucose monitor as told by your health care provider. The readings can help determine if you have low or high blood glucose. Generally, you should have these blood glucose levels: Before meals (preprandial): 80-130 mg/dL (4.4-7.2 mmol/L). After meals (postprandial): below 180 mg/dL (10 mmol/L). Hemoglobin A1c (HbA1c) level: less than 7%. Your health care provider will set treatment goals for you. Keep all follow-up visits. This is important. Follow these instructions at home: Diabetes medicines Take your diabetes medicines every day as told by your health care provider. List your diabetes medicines here: Name of medicine: ______________________________ Amount (dose): _______________ Time (a.m./p.m.): _______________ Notes: ___________________________________ Name of medicine: ______________________________ Amount (dose): _______________ Time (a.m./p.m.): _______________ Notes: ___________________________________ Name of medicine: ______________________________ Amount (dose):  _______________ Time (a.m./p.m.): _______________ Notes: ___________________________________ Insulin If you use insulin, list the types of insulin you use here: Insulin type: ______________________________ Amount (dose): _______________ Time (a.m./p.m.): _______________Notes: ___________________________________ Insulin type: ______________________________ Amount (dose): _______________ Time (a.m./p.m.): _______________ Notes: ___________________________________ Insulin type: ______________________________ Amount (dose): _______________ Time (a.m./p.m.): _______________ Notes: ___________________________________ Insulin type: ______________________________ Amount (dose): _______________ Time (a.m./p.m.): _______________ Notes: ___________________________________ Insulin type: ______________________________ Amount (dose): _______________ Time (a.m./p.m.): _______________ Notes: ___________________________________ Managing blood glucose  Check your blood glucose levels using a glucose monitor as told by your health care provider. Write down the times that you check your glucose levels here: Time: _______________ Notes: ___________________________________ Time: _______________ Notes: ___________________________________ Time: _______________ Notes: ___________________________________ Time: _______________ Notes: ___________________________________ Time: _______________ Notes: ___________________________________ Time: _______________ Notes: ___________________________________  Low blood glucose Low blood glucose (hypoglycemia) is when glucose is at or below 70 mg/dL (3.9 mmol/L). Symptoms may include: Feeling: Hungry. Sweaty and clammy. Irritable or easily upset. Dizzy. Sleepy. Having: A fast heartbeat. A headache. A change in your vision. Numbness around the mouth, lips, or tongue. Having trouble with: Moving (coordination). Sleeping. Treating low blood glucose To treat low blood  glucose, eat or drink something containing sugar right away. If you can think clearly and swallow safely, follow the 15:15 rule: Take 15 grams of a fast-acting carb (carbohydrate), as told by your health care provider. Some fast-acting carbs are: Glucose tablets: take 3-4 tablets. Hard candy: eat 3-5 pieces. Fruit juice: drink 4 oz (120 mL). Regular (not diet) soda: drink 4-6 oz (120-180 mL). Honey or sugar: eat 1 Tbsp (15 mL). Check your blood glucose levels 15 minutes after you take the carb. If your glucose is still at or below 70 mg/dL (3.9 mmol/L), take 15 grams of a carb again. If your glucose does not go above 70 mg/dL (3.9 mmol/L) after   3 tries, get help right away. After your glucose goes back to normal, eat a meal or a snack within 1 hour. Treating very low blood glucose If your glucose is at or below 54 mg/dL (3 mmol/L), you have very low blood glucose (severe hypoglycemia). This is an emergency. Do not wait to see if the symptoms will go away. Get medical help right away. Call your local emergency services (911 in the [place] Do not drive yourself to the hospital. Questions to ask your health care provider Should I talk with a diabetes educator? What equipment will I need to care for myself at home? What diabetes medicines do I need? When should I take them? How often do I need to check my blood glucose levels? What number can I call if I have questions? When is my follow-up visit? Where can I find a support group for people with diabetes? Where to find more information American Diabetes Association: www.diabetes.org Association of Diabetes Care and Education Specialists: www.diabeteseducator.org Contact a health care provider if: Your blood glucose is at or above 240 mg/dL (13.3 mmol/L) for 2 days in a row. You have been sick or have had a fever for 2 days or more, and you are not getting better. You have any of these problems for more than 6 hours: You cannot eat or  drink. You feel nauseous. You vomit. You have diarrhea. Get help right away if: Your blood glucose is lower than 54 mg/dL (3 mmol/L). You get confused. You have trouble thinking clearly. You have trouble breathing. These symptoms may represent a serious problem that is an emergency. Do not wait to see if the symptoms will go away. Get medical help right away. Call your local emergency services (911 in the [place] Do not drive yourself to the hospital. Summary Diabetes mellitus is a chronic disease that occurs when the body does not properly use sugar (glucose) that is released from food after you eat. Take insulin and diabetes medicines as told. Check your blood glucose every day, as often as told. Keep all follow-up visits. This is important. This information is not intended to replace advice given to you by your health care provider. Make sure you discuss any questions you have with your health care provider. Document Revised: 06/25/2019 Document Reviewed: 06/25/2019 Elsevier Patient Education  2023 [employer] Inc.  

## 2022-02-14 ENCOUNTER — Ambulatory Visit (INDEPENDENT_AMBULATORY_CARE_PROVIDER_SITE_OTHER): Payer: PPO | Admitting: Nurse Practitioner

## 2022-02-14 ENCOUNTER — Encounter: Payer: Self-pay | Admitting: Nurse Practitioner

## 2022-02-14 VITALS — BP 134/78 | HR 62 | Temp 97.7°F | Ht 64.02 in | Wt 189.0 lb

## 2022-02-14 DIAGNOSIS — E669 Obesity, unspecified: Secondary | ICD-10-CM

## 2022-02-14 DIAGNOSIS — E1169 Type 2 diabetes mellitus with other specified complication: Secondary | ICD-10-CM

## 2022-02-14 MED ORDER — BD PEN NEEDLE SHORT U/F 31G X 8 MM MISC: 12 refills | Status: DC

## 2022-02-14 NOTE — Progress Notes (Signed)
BP 134/78   Pulse 62   Temp 97.7 F (36.5 C) (Oral)   Ht 5' 4.02" (1.626 m)   Wt 189 lb (85.7 kg)   LMP  (LMP Unknown)   SpO2 95%   BMI 32.43 kg/m    Subjective:    [name], female    DOB: 1955-08-17, 66 y.o.   MRN: [id number]  HPI: April Johnson is a 66 y.o. female  Chief Complaint  Patient presents with   Diabetes    Patient was started on Januvia at last visit   DIABETES A1c in November 8.8%, added on Januvia due to lower cost.   Taking Levemir 60 units daily and Metformin 1000 MG BID.  Not taking Ozempic as was too expensive, cost $300 for month supply.  Not taking Wilder Glade, as wanted to charge her $137 for month supply.   Working with CCM team on costs and medications -- goal is to put [place] and [place] back on board.  She does endorse dietary indiscretions this time of year. Hypoglycemic episodes:no Polydipsia/polyuria: no Visual disturbance: no Chest pain: no Paresthesias: no Glucose Monitoring: yes  Accucheck frequency: Daily  Fasting glucose:   Post prandial:  Evening: 170 in morning on checks  Before meals: Taking Insulin?: yes  Long acting insulin: 60 units daily  Short acting insulin: Blood Pressure Monitoring: not checking Retinal Examination: Not up to Date Foot Exam: Up to Date Pneumovax: Up to Date Influenza: Up to Date Aspirin: no   Relevant past medical, surgical, family and social history reviewed and updated as indicated. Interim medical history since our last visit reviewed. Allergies and medications reviewed and updated.  Review of Systems  Constitutional:  Negative for activity change, appetite change, diaphoresis, fatigue and fever.  Respiratory:  Negative for cough, chest tightness and shortness of breath.   Cardiovascular:  Negative for chest pain, palpitations and leg swelling.  Gastrointestinal: Negative.   Endocrine: Negative for polydipsia, polyphagia and polyuria.  Neurological: Negative.    Psychiatric/Behavioral: Negative.     Per HPI unless specifically indicated above     Objective:    BP 134/78   Pulse 62   Temp 97.7 F (36.5 C) (Oral)   Ht 5' 4.02" (1.626 m)   Wt 189 lb (85.7 kg)   LMP  (LMP Unknown)   SpO2 95%   BMI 32.43 kg/m   Wt Readings from Last 3 Encounters:  02/14/22 189 lb (85.7 kg)  01/14/22 189 lb 4.8 oz (85.9 kg)  10/14/21 183 lb 4.8 oz (83.1 kg)    Physical Exam Vitals and nursing note reviewed.  Constitutional:      General: She is awake. She is not in acute distress.    Appearance: She is well-developed and well-groomed. She is obese. She is not ill-appearing.  HENT:     Head: Normocephalic.     Right Ear: Hearing normal.     Left Ear: Hearing normal.  Eyes:     General: Lids are normal.        Right eye: No discharge.        Left eye: No discharge.     Conjunctiva/sclera: Conjunctivae normal.     Pupils: Pupils are equal, round, and reactive to light.  Neck:     Thyroid: No thyromegaly.     Vascular: No carotid bruit.  Cardiovascular:     Rate and Rhythm: Normal rate and regular rhythm.     Heart sounds: Normal heart sounds. No murmur  heard.    No gallop.  Pulmonary:     Effort: Pulmonary effort is normal.     Breath sounds: Normal breath sounds.  Abdominal:     General: Bowel sounds are normal.     Palpations: Abdomen is soft.  Musculoskeletal:     Cervical back: Normal range of motion and neck supple.     Right lower leg: No edema.     Left lower leg: No edema.  Skin:    General: Skin is warm and dry.  Neurological:     Mental Status: She is alert and oriented to person, place, and time.  Psychiatric:        Attention and Perception: Attention normal.        Mood and Affect: Mood normal.        Speech: Speech normal.        Behavior: Behavior normal. Behavior is cooperative.        Thought Content: Thought content normal.     Results for orders placed or performed in visit on 01/14/22  Bayer DCA Hb A1c Waived   Result Value Ref Range   HB A1C (BAYER DCA - WAIVED) 8.8 (H) 4.8 - 5.6 %  Lipid Panel w/o Chol/HDL Ratio  Result Value Ref Range   Cholesterol, Total 124 100 - 199 mg/dL   Triglycerides 184 (H) 0 - 149 mg/dL   HDL 50 >39 mg/dL   VLDL Cholesterol Cal 30 5 - 40 mg/dL   LDL Chol Calc (NIH) 44 0 - 99 mg/dL  TSH  Result Value Ref Range   TSH 2.140 0.450 - 4.500 uIU/mL  VITAMIN D 25 Hydroxy (Vit-D Deficiency, Fractures)  Result Value Ref Range   Vit D, 25-Hydroxy 32.1 30.0 - 100.0 ng/mL  Comprehensive metabolic panel  Result Value Ref Range   Glucose 197 (H) 70 - 99 mg/dL   BUN 16 8 - 27 mg/dL   Creatinine, Ser 0.68 0.57 - 1.00 mg/dL   eGFR 97 >59 mL/min/1.73   BUN/Creatinine Ratio 24 12 - 28   Sodium 138 134 - 144 mmol/L   Potassium 4.3 3.5 - 5.2 mmol/L   Chloride 102 96 - 106 mmol/L   CO2 21 20 - 29 mmol/L   Calcium 9.5 8.7 - 10.3 mg/dL   Total Protein 6.5 6.0 - 8.5 g/dL   Albumin 4.2 3.9 - 4.9 g/dL   Globulin, Total 2.3 1.5 - 4.5 g/dL   Albumin/Globulin Ratio 1.8 1.2 - 2.2   Bilirubin Total 0.5 0.0 - 1.2 mg/dL   Alkaline Phosphatase 78 44 - 121 IU/L   AST 15 0 - 40 IU/L   ALT 22 0 - 32 IU/L      Assessment & Plan:   Problem List Items Addressed This Visit       Endocrine   Type 2 diabetes mellitus with obesity ([place]) - Primary    Chronic, ongoing with A1c trending up to 8.8% recent visit due to not taking [place] and Ozempic due to cost.  She is working with CCM team on this.  Continue Metformin 1000 MG BID and Levemir + Januvia 100 MG daily, this may be more cost effective at this time.  Educated her on this medication and side effects.  Will see if can get assist for Ozempic and Wilder Glade which worked well for her, with goal of insulin reduction and possible discontinuation in future.  Return in February.        Follow up plan: Return in about 9  weeks (around 04/18/2022) for [place] + T2DM, HTN/HLD, VIT D.

## 2022-02-14 NOTE — Assessment & Plan Note (Signed)
Chronic, ongoing with A1c trending up to 8.8% recent visit due to not taking [place] and Ozempic due to cost.  She is working with CCM team on this.  Continue Metformin 1000 MG BID and Levemir + Januvia 100 MG daily, this may be more cost effective at this time.  Educated her on this medication and side effects.  Will see if can get assist for Ozempic and Wilder Glade which worked well for her, with goal of insulin reduction and possible discontinuation in future.  Return in February.

## 2022-02-15 ENCOUNTER — Other Ambulatory Visit: Payer: Self-pay | Admitting: Nurse Practitioner

## 2022-02-15 NOTE — Telephone Encounter (Signed)
Unable to refill per protocol, Rx request is too soon. Last refill 01/14/22 for 30 and 5 refills.  Requested Prescriptions  Pending Prescriptions Disp Refills   JANUVIA 100 MG tablet [Pharmacy Med Name: JANUVIA 100 MG TABLET] 30 tablet 5    Sig: TAKE 1 TABLET BY [place] DAY     Endocrinology:  Diabetes - DPP-4 Inhibitors Failed - 02/15/2022 11:27 AM      Failed - HBA1C is between 0 and 7.9 and within 180 days    Hemoglobin A1C  Date Value Ref Range Status  10/27/2015 7.3%  Final   HB A1C (BAYER DCA - WAIVED)  Date Value Ref Range Status  01/14/2022 8.8 (H) 4.8 - 5.6 % Final    Comment:             Prediabetes: 5.7 - 6.4          Diabetes: >6.4          Glycemic control for adults with diabetes: <7.0          Passed - Cr in normal range and within 360 days    Creatinine, Ser  Date Value Ref Range Status  01/14/2022 0.68 0.57 - 1.00 mg/dL Final         Passed - Valid encounter within last 6 months    Recent Outpatient Visits           Yesterday Type 2 diabetes mellitus with obesity ([place])   [place], [name] T, NP   1 month ago Type 2 diabetes mellitus with obesity ([place])   [place], [name] T, NP   4 months ago Type 2 diabetes mellitus with obesity ([place])   [place], [name] T, NP   7 months ago Type 2 diabetes mellitus with obesity ([place])   [place], [name] T, NP   10 months ago Type 2 diabetes mellitus with obesity ([place])   [place], [name], NP

## 2022-02-18 ENCOUNTER — Telehealth: Payer: Self-pay | Admitting: Nurse Practitioner

## 2022-02-18 NOTE — Telephone Encounter (Signed)
Paperwork placed in [facility]

## 2022-02-18 NOTE — Telephone Encounter (Signed)
PT dropped 2 Novo Nordisk Patient Copy to be filled out by provider.  Placed in provider's folder.

## 2022-02-21 NOTE — Telephone Encounter (Signed)
Signed forms have been placed in pharmacy folder

## 2022-02-21 NOTE — Telephone Encounter (Signed)
Pharmacy team notified via teams 

## 2022-03-02 ENCOUNTER — Ambulatory Visit (INDEPENDENT_AMBULATORY_CARE_PROVIDER_SITE_OTHER): Payer: PPO

## 2022-03-02 ENCOUNTER — Telehealth: Payer: HMO

## 2022-03-02 DIAGNOSIS — E1159 Type 2 diabetes mellitus with other circulatory complications: Secondary | ICD-10-CM

## 2022-03-02 DIAGNOSIS — E1169 Type 2 diabetes mellitus with other specified complication: Secondary | ICD-10-CM

## 2022-03-02 NOTE — Patient Instructions (Signed)
Please call the care guide team at [phone] if you need to cancel or reschedule your appointment.   If you are experiencing a Mental Health or [place] or need someone to talk to, please call the Suicide and Crisis Lifeline: 988 call the [place] National Suicide Prevention Lifeline: 772 285 7209 or TTY: 640-535-8801 TTY 281-208-4993) to talk to a trained counselor call 1-800-273-TALK (toll free, 24 hour hotline)   Following is a copy of the CCM Program Consent:  CCM service includes personalized support from designated clinical staff supervised by the physician, including individualized plan of care and coordination with other care providers 24/7 contact phone numbers for assistance for urgent and routine care needs. Service will only be billed when office clinical staff spend 20 minutes or more in a month to coordinate care. Only one practitioner may furnish and bill the service in a calendar month. The patient may stop CCM services at amy time (effective at the end of the month) by phone call to the office staff. The patient will be responsible for cost sharing (co-pay) or up to 20% of the service fee (after annual deductible is met)  Following is a copy of your full provider care plan:   Goals Addressed             This Visit's Progress    CCM Expected Outcome:  Monitor, Self-Manage and Reduce Symptoms of Diabetes       Current Barriers:  Care Coordination needs related to cost constraints related to medications she is prescribed for management of blood sugars and keeping A1C in goal in a patient with DM Chronic Disease Management support and education needs related to effective management of DM Financial Constraints.  Difficulty obtaining medications  Lab Results  Component Value Date   HGBA1C 8.8 (H) 01/14/2022   The patient states when she is on Ozempic her A1C is down to 5.7  Planned Interventions: Provided education to patient about basic DM disease  process; Reviewed medications with patient and discussed importance of medication adherence. The patient was doing well on Ozempic but cannot afford it with the health plan she has now. The patient states that she is switching health plans in January and her medications will be more cost effective. Will collaborate with the pharm D for other ideas. Also will collaborate with the pcp to see if the office has samples she can use to carry her through to get her to new coverage. The patient states that she will switch insurances in January and will start back on Ozempic. She has talked to the pharm D. Education and support given;        Reviewed prescribed diet with patient heart healthy/ADA diet. States she has cut out pastas and breads. She is mindful of her dietary intake. Education and support given. She has been having elevated blood sugars but has also had the flu. States she is feeling better now.; Counseled on importance of regular laboratory monitoring as prescribed;        Discussed plans with patient for ongoing care management follow up and provided patient with direct contact information for care management team;      Provided patient with written educational materials related to hypo and hyperglycemia and importance of correct treatment;       Reviewed scheduled/upcoming provider appointments including: 04-19-2022 at 1020 am       Advised patient, providing education and rationale, to check cbg when you have symptoms of low or high blood sugar and as  directed   and record. Her blood sugars have been the highest at 333 and is down to 181.  Education on the goal of fasting <130 and post prandial of <180.        call provider for findings outside established parameters;       Referral made to pharmacy team for assistance with cost constraints related to [place] and [place];       Review of patient status, including review of consultants reports, relevant laboratory and other test results, and  medications completed;       Advised patient to discuss if there are samples available in the office at her upcoming office visit, questions and concerns about her DM with provider. Wants to talk to the pcp about a continuous glucose meter;      Screening for signs and symptoms of depression related to chronic disease state;        Assessed social determinant of health barriers;      Supplied education on the [place] program at [email or web address] for help with grief support after the loss of her husband, [name] last year in June. The patient is thankful she has a good support system from her family and friends. Reflective listening and support given.     Symptom Management: Take medications as prescribed   Attend all scheduled provider appointments Call provider office for new concerns or questions  call the Suicide and Crisis Lifeline: 988 call the [place] National Suicide Prevention Lifeline: (386)080-2529 or TTY: (336)132-2169 TTY 564-148-1048) to talk to a trained counselor call 1-800-273-TALK (toll free, 24 hour hotline) if experiencing a Mental Health or [place]  schedule appointment with eye doctor check feet daily for cuts, sores or redness trim toenails straight across manage portion size wash and dry feet carefully every day wear comfortable, cotton socks wear comfortable, well-fitting shoes  Follow Up Plan: Telephone follow up appointment with care management team member scheduled for: 04-26-2022 at 1145 am       CCM Expected Outcome:  Monitor, Self-Manage and Reduce Symptoms of: HLD       Current Barriers:  Chronic Disease Management support and education needs related to effective management of HLD Lab Results  Component Value Date   CHOL 124 01/14/2022   HDL 50 01/14/2022   LDLCALC 44 01/14/2022   TRIG 184 (H) 01/14/2022   CHOLHDL 3.2 01/24/2018     Planned Interventions: Provider established cholesterol goals reviewed; Counseled on importance of  regular laboratory monitoring as prescribed. Has regular lab work; Provided HLD Scientist, clinical (histocompatibility and immunogenetics); Reviewed role and benefits of statin for ASCVD risk reduction; Discussed strategies to manage statin-induced myalgias; Reviewed importance of limiting foods high in cholesterol. The patient is monitoring her dietary intake and trying to eat healthy. Has eliminated certain foods in her diet. The patient denies issues with dietary restrictions. Education and support given; Reviewed exercise goals and target of 150 minutes per week; Screening for signs and symptoms of depression related to chronic disease state;  Assessed social determinant of health barriers;   Symptom Management: Take medications as prescribed   Attend all scheduled provider appointments Call provider office for new concerns or questions  call the Suicide and Crisis Lifeline: 988 call the [place] National Suicide Prevention Lifeline: 417 059 2445 or TTY: (661) 004-0041 TTY (613)538-2667) to talk to a trained counselor call 1-800-273-TALK (toll free, 24 hour hotline) if experiencing a Mental Health or [place]  - call for medicine refill 2 or 3 days before it runs out -  take all medications exactly as prescribed - call doctor with any symptoms you believe are related to your medicine - call doctor when you experience any new symptoms - go to all doctor appointments as scheduled  Follow Up Plan: Telephone follow up appointment with care management team member scheduled for: 04-26-2022 at 1145 am       CCM Expected Outcome:  Monitor, Self-Manage, and Reduce Symptoms of Hypertension       Current Barriers:  Chronic Disease Management support and education needs related to effective management of HTN BP Readings from Last 3 Encounters:  02/14/22 134/78  01/14/22 136/70  10/14/21 128/86     Planned Interventions: Evaluation of current treatment plan related to hypertension self management and patient's  adherence to plan as established by provider. Saw the patient on 02-14-2022. The patient has good control of HTN at this time;   Provided education to patient re: stroke prevention, s/s of heart attack and stroke; Reviewed prescribed diet heart healthy/ADA diet. Review of heart healthy/ADA diet. The patient had the Flu last week and has not been eating well. She is feeling better today. Reviewed medications with patient and discussed importance of compliance. The patient is compliant with medications;  Discussed plans with patient for ongoing care management follow up and provided patient with direct contact information for care management team; Advised patient, providing education and rationale, to monitor blood pressure daily and record, calling PCP for findings outside established parameters;  Reviewed scheduled/upcoming provider appointments including: 04-19-2022 at 1020 am Advised patient to discuss changes in blood pressures or heart health with provider; Provided education on prescribed diet heart healthy/ADA diet ;  Discussed complications of poorly controlled blood pressure such as heart disease, stroke, circulatory complications, vision complications, kidney impairment, sexual dysfunction;  Screening for signs and symptoms of depression related to chronic disease state;  Assessed social determinant of health barriers;   Symptom Management: Take medications as prescribed   Attend all scheduled provider appointments Call provider office for new concerns or questions  call the Suicide and Crisis Lifeline: 988 call the [place] National Suicide Prevention Lifeline: (220) 660-7619 or TTY: 424 061 4024 TTY [phone]) to talk to a trained counselor call 1-800-273-TALK (toll free, 24 hour hotline) if experiencing a Mental Health or [place]  check blood pressure weekly learn about high blood pressure keep a blood pressure log take blood pressure log to all doctor  appointments call doctor for signs and symptoms of high blood pressure develop an action plan for high blood pressure keep all doctor appointments take medications for blood pressure exactly as prescribed report new symptoms to your doctor  Follow Up Plan: Telephone follow up appointment with care management team member scheduled for: 04-26-2022 at 1145 am          Patient verbalizes understanding of instructions and care plan provided today and agrees to view in [place]. Active MyChart status and patient understanding of how to access instructions and care plan via MyChart confirmed with patient.     Telephone follow up appointment with care management team member scheduled for: 04-26-2022 at 1145 am

## 2022-03-02 NOTE — Chronic Care Management (AMB) (Signed)
Chronic Care Management   CCM RN Visit Note  03/02/2022 Name: April Johnson MRN: [id number] DOB: [birth year]  Subjective: April Johnson is a 66 y.o. year old female who is a primary care patient of Cann[place], [name], NP. The patient was referred to the Chronic Care Management team for assistance with care management needs subsequent to provider initiation of CCM services and plan of care.    Today's Visit:  Engaged with patient by telephone for follow up visit.        Goals Addressed             This Visit's Progress    CCM Expected Outcome:  Monitor, Self-Manage and Reduce Symptoms of Diabetes       Current Barriers:  Care Coordination needs related to cost constraints related to medications she is prescribed for management of blood sugars and keeping A1C in goal in a patient with DM Chronic Disease Management support and education needs related to effective management of DM Financial Constraints.  Difficulty obtaining medications  Lab Results  Component Value Date   HGBA1C 8.8 (H) 01/14/2022   The patient states when she is on Ozempic her A1C is down to 5.7  Planned Interventions: Provided education to patient about basic DM disease process; Reviewed medications with patient and discussed importance of medication adherence. The patient was doing well on Ozempic but cannot afford it with the health plan she has now. The patient states that she is switching health plans in January and her medications will be more cost effective. Will collaborate with the pharm D for other ideas. Also will collaborate with the pcp to see if the office has samples she can use to carry her through to get her to new coverage. The patient states that she will switch insurances in January and will start back on Ozempic. She has talked to the pharm D. Education and support given;        Reviewed prescribed diet with patient heart healthy/ADA diet. States she has cut out pastas and breads.  She is mindful of her dietary intake. Education and support given. She has been having elevated blood sugars but has also had the flu. States she is feeling better now.; Counseled on importance of regular laboratory monitoring as prescribed;        Discussed plans with patient for ongoing care management follow up and provided patient with direct contact information for care management team;      Provided patient with written educational materials related to hypo and hyperglycemia and importance of correct treatment;       Reviewed scheduled/upcoming provider appointments including: 04-19-2022 at 1020 am       Advised patient, providing education and rationale, to check cbg when you have symptoms of low or high blood sugar and as directed   and record. Her blood sugars have been the highest at 333 and is down to 181.  Education on the goal of fasting <130 and post prandial of <180.        call provider for findings outside established parameters;       Referral made to pharmacy team for assistance with cost constraints related to [place] and [place];       Review of patient status, including review of consultants reports, relevant laboratory and other test results, and medications completed;       Advised patient to discuss if there are samples available in the office at her upcoming office visit, questions and concerns about  her DM with provider. Wants to talk to the pcp about a continuous glucose meter;      Screening for signs and symptoms of depression related to chronic disease state;        Assessed social determinant of health barriers;      Supplied education on the [place] program at [email or web address] for help with grief support after the loss of her husband, [name] last year in June. The patient is thankful she has a good support system from her family and friends. Reflective listening and support given.     Symptom Management: Take medications as prescribed   Attend all scheduled provider  appointments Call provider office for new concerns or questions  call the Suicide and Crisis Lifeline: 988 call the [place] National Suicide Prevention Lifeline: 216 391 0300 or TTY: (660)230-2695 TTY 2817687542) to talk to a trained counselor call 1-800-273-TALK (toll free, 24 hour hotline) if experiencing a Mental Health or [place]  schedule appointment with eye doctor check feet daily for cuts, sores or redness trim toenails straight across manage portion size wash and dry feet carefully every day wear comfortable, cotton socks wear comfortable, well-fitting shoes  Follow Up Plan: Telephone follow up appointment with care management team member scheduled for: 04-26-2022 at 1145 am       CCM Expected Outcome:  Monitor, Self-Manage and Reduce Symptoms of: HLD       Current Barriers:  Chronic Disease Management support and education needs related to effective management of HLD Lab Results  Component Value Date   CHOL 124 01/14/2022   HDL 50 01/14/2022   LDLCALC 44 01/14/2022   TRIG 184 (H) 01/14/2022   CHOLHDL 3.2 01/24/2018     Planned Interventions: Provider established cholesterol goals reviewed; Counseled on importance of regular laboratory monitoring as prescribed. Has regular lab work; Provided HLD Scientist, clinical (histocompatibility and immunogenetics); Reviewed role and benefits of statin for ASCVD risk reduction; Discussed strategies to manage statin-induced myalgias; Reviewed importance of limiting foods high in cholesterol. The patient is monitoring her dietary intake and trying to eat healthy. Has eliminated certain foods in her diet. The patient denies issues with dietary restrictions. Education and support given; Reviewed exercise goals and target of 150 minutes per week; Screening for signs and symptoms of depression related to chronic disease state;  Assessed social determinant of health barriers;   Symptom Management: Take medications as prescribed   Attend all scheduled  provider appointments Call provider office for new concerns or questions  call the Suicide and Crisis Lifeline: 988 call the [place] National Suicide Prevention Lifeline: (254)533-9281 or TTY: (858)733-8165 TTY 606-322-0169) to talk to a trained counselor call 1-800-273-TALK (toll free, 24 hour hotline) if experiencing a Mental Health or [place]  - call for medicine refill 2 or 3 days before it runs out - take all medications exactly as prescribed - call doctor with any symptoms you believe are related to your medicine - call doctor when you experience any new symptoms - go to all doctor appointments as scheduled  Follow Up Plan: Telephone follow up appointment with care management team member scheduled for: 04-26-2022 at 1145 am       CCM Expected Outcome:  Monitor, Self-Manage, and Reduce Symptoms of Hypertension       Current Barriers:  Chronic Disease Management support and education needs related to effective management of HTN BP Readings from Last 3 Encounters:  02/14/22 134/78  01/14/22 136/70  10/14/21 128/86     Planned Interventions: Evaluation of current  treatment plan related to hypertension self management and patient's adherence to plan as established by provider. Saw the patient on 02-14-2022. The patient has good control of HTN at this time;   Provided education to patient re: stroke prevention, s/s of heart attack and stroke; Reviewed prescribed diet heart healthy/ADA diet. Review of heart healthy/ADA diet. The patient had the Flu last week and has not been eating well. She is feeling better today. Reviewed medications with patient and discussed importance of compliance. The patient is compliant with medications;  Discussed plans with patient for ongoing care management follow up and provided patient with direct contact information for care management team; Advised patient, providing education and rationale, to monitor blood pressure daily and record,  calling PCP for findings outside established parameters;  Reviewed scheduled/upcoming provider appointments including: 04-19-2022 at 1020 am Advised patient to discuss changes in blood pressures or heart health with provider; Provided education on prescribed diet heart healthy/ADA diet ;  Discussed complications of poorly controlled blood pressure such as heart disease, stroke, circulatory complications, vision complications, kidney impairment, sexual dysfunction;  Screening for signs and symptoms of depression related to chronic disease state;  Assessed social determinant of health barriers;   Symptom Management: Take medications as prescribed   Attend all scheduled provider appointments Call provider office for new concerns or questions  call the Suicide and Crisis Lifeline: 988 call the [place] National Suicide Prevention Lifeline: 410-875-4571 or TTY: (509)572-5917 TTY 830-587-6174) to talk to a trained counselor call 1-800-273-TALK (toll free, 24 hour hotline) if experiencing a Mental Health or [place]  check blood pressure weekly learn about high blood pressure keep a blood pressure log take blood pressure log to all doctor appointments call doctor for signs and symptoms of high blood pressure develop an action plan for high blood pressure keep all doctor appointments take medications for blood pressure exactly as prescribed report new symptoms to your doctor  Follow Up Plan: Telephone follow up appointment with care management team member scheduled for: 04-26-2022 at 1145 am          Plan:Telephone follow up appointment with care management team member scheduled for:  04-26-2022 at 1145 am  Noreene Larsson RN, MSN, CCM RN Care Manager  Chronic Care Management Direct Number: [phone]

## 2022-03-03 ENCOUNTER — Telehealth: Payer: Self-pay

## 2022-03-03 NOTE — Telephone Encounter (Signed)
Noted  

## 2022-03-03 NOTE — Telephone Encounter (Signed)
PA started for Ondansetron HCI '4mg'$  through Covermy meds. Awaiting on determination

## 2022-03-05 ENCOUNTER — Other Ambulatory Visit: Payer: Self-pay | Admitting: Nurse Practitioner

## 2022-03-06 DIAGNOSIS — E785 Hyperlipidemia, unspecified: Secondary | ICD-10-CM | POA: Diagnosis not present

## 2022-03-06 DIAGNOSIS — I10 Essential (primary) hypertension: Secondary | ICD-10-CM | POA: Diagnosis not present

## 2022-03-06 DIAGNOSIS — E1159 Type 2 diabetes mellitus with other circulatory complications: Secondary | ICD-10-CM

## 2022-03-06 NOTE — Telephone Encounter (Signed)
Requested Prescriptions  Pending Prescriptions Disp Refills   traZODone (DESYREL) 50 MG tablet [Pharmacy Med Name: TRAZODONE 50 MG TABLET] 90 tablet 1    Sig: TAKE 1/2 TO 1 TABLET BY MOUTH AT BEDTIME AS NEEDED FOR SLEEP     Psychiatry: Antidepressants - Serotonin Modulator Passed - 03/05/2022  8:57 AM      Passed - Valid encounter within last 6 months    Recent Outpatient Visits           2 weeks ago Type 2 diabetes mellitus with obesity ([place])   [place], [name] T, NP   1 month ago Type 2 diabetes mellitus with obesity ([place])   [place], [name] T, NP   4 months ago Type 2 diabetes mellitus with obesity ([place])   [place], [name] T, NP   7 months ago Type 2 diabetes mellitus with obesity ([place])   [place], [name] T, NP   10 months ago Type 2 diabetes mellitus with obesity ([place])   [place], [name], NP

## 2022-03-09 ENCOUNTER — Telehealth: Payer: Self-pay

## 2022-03-09 ENCOUNTER — Telehealth: Payer: Self-pay | Admitting: Nurse Practitioner

## 2022-03-09 NOTE — Telephone Encounter (Signed)
PA for Ozempic '4mg'$ /68m has been approved through 03/07/23

## 2022-03-09 NOTE — Telephone Encounter (Signed)
PA started for Ozempic '4mg'$ /19m through Covermy meds. Awaiting on determination

## 2022-03-10 ENCOUNTER — Telehealth: Payer: Self-pay | Admitting: Nurse Practitioner

## 2022-03-10 NOTE — Telephone Encounter (Signed)
Patient was checking on the status of medication below. Informed the patient of the status of all medications : Patient has requested refill too soon

## 2022-03-10 NOTE — Telephone Encounter (Signed)
Patient would like to know if she should still be taking her Januvia since she is now able to take her Ozempic

## 2022-03-10 NOTE — Telephone Encounter (Signed)
Metformin: 04/08/21 #360/4 RF Metoprolol: 04/08/21 #90/4 RF Amlodipine: 04/08/21 #90 4 RF Omeprazole: 07/21/21 #90 4 RF Meloxicam: 04/19/21 #90 4 RF Telmisartan: 04/08/21 #90 4 RF Atorvastatin: 10/15/21 #90 3 RF Requested Prescriptions  Refused Prescriptions Disp Refills   FARXIGA 5 MG TABS tablet [Pharmacy Med Name: FARXIGA 5 MG TABLET] 30 tablet 14    Sig: TAKE 1 TABLET (5 MG TOTAL) BY MOUTH DAILY.     Endocrinology:  Diabetes - SGLT2 Inhibitors Failed - 03/09/2022 10:49 AM      Failed - HBA1C is between 0 and 7.9 and within 180 days    Hemoglobin A1C  Date Value Ref Range Status  10/27/2015 7.3%  Final   HB A1C (BAYER DCA - WAIVED)  Date Value Ref Range Status  01/14/2022 8.8 (H) 4.8 - 5.6 % Final    Comment:             Prediabetes: 5.7 - 6.4          Diabetes: >6.4          Glycemic control for adults with diabetes: <7.0          Passed - Cr in normal range and within 360 days    Creatinine, Ser  Date Value Ref Range Status  01/14/2022 0.68 0.57 - 1.00 mg/dL Final         Passed - eGFR in normal range and within 360 days    GFR calc Af Amer  Date Value Ref Range Status  04/01/2020 95 >59 mL/min/1.73 Final    Comment:    **In accordance with recommendations from the NKF-ASN Task force,**   [facility] is in the process of updating its eGFR calculation to the   2021 CKD-EPI creatinine equation that estimates kidney function   without a race variable.    GFR calc non Af Amer  Date Value Ref Range Status  04/01/2020 82 >59 mL/min/1.73 Final   eGFR  Date Value Ref Range Status  01/14/2022 97 >59 mL/min/1.73 Final         Passed - Valid encounter within last 6 months    Recent Outpatient Visits           3 weeks ago Type 2 diabetes mellitus with obesity ([place])   [place] Perkins, [name] T, NP   1 month ago Type 2 diabetes mellitus with obesity ([place])   [place], [name] T, NP   4 months ago Type 2 diabetes mellitus with obesity ([place])    [place], [name] T, NP   7 months ago Type 2 diabetes mellitus with obesity ([place])   [place], [name] T, NP   10 months ago Type 2 diabetes mellitus with obesity ([place])   [place], [name] T, NP               metFORMIN (GLUCOPHAGE) 500 MG tablet [Pharmacy Med Name: METFORMIN HCL 500 MG TABLET] 360 tablet 4    Sig: TAKE 2 TABLETS (1,000 MG TOTAL) BY MOUTH 2 (TWO) TIMES DAILY WITH A MEAL.     Endocrinology:  Diabetes - Biguanides Failed - 03/09/2022 10:49 AM      Failed - HBA1C is between 0 and 7.9 and within 180 days    Hemoglobin A1C  Date Value Ref Range Status  10/27/2015 7.3%  Final   HB A1C (BAYER DCA - WAIVED)  Date Value Ref Range Status  01/14/2022 8.8 (H) 4.8 - 5.6 % Final  Comment:             Prediabetes: 5.7 - 6.4          Diabetes: >6.4          Glycemic control for adults with diabetes: <7.0          Failed - B12 Level in normal range and within 720 days    Vitamin B-12  Date Value Ref Range Status  06/03/2019 471 232 - 1,245 pg/mL Final         Passed - Cr in normal range and within 360 days    Creatinine, Ser  Date Value Ref Range Status  01/14/2022 0.68 0.57 - 1.00 mg/dL Final         Passed - eGFR in normal range and within 360 days    GFR calc Af Amer  Date Value Ref Range Status  04/01/2020 95 >59 mL/min/1.73 Final    Comment:    **In accordance with recommendations from the NKF-ASN Task force,**   [facility] is in the process of updating its eGFR calculation to the   2021 CKD-EPI creatinine equation that estimates kidney function   without a race variable.    GFR calc non Af Amer  Date Value Ref Range Status  04/01/2020 82 >59 mL/min/1.73 Final   eGFR  Date Value Ref Range Status  01/14/2022 97 >59 mL/min/1.73 Final         Passed - Valid encounter within last 6 months    Recent Outpatient Visits           3 weeks ago Type 2 diabetes mellitus with obesity ([place])    [place], [name] T, NP   1 month ago Type 2 diabetes mellitus with obesity ([place])   [place], [name] T, NP   4 months ago Type 2 diabetes mellitus with obesity ([place])   [place], [name] T, NP   7 months ago Type 2 diabetes mellitus with obesity ([place])   Rudd, [name] T, NP   10 months ago Type 2 diabetes mellitus with obesity ([place])   [place], Brinson T, NP              Passed - CBC within normal limits and completed in the last 12 months    WBC  Date Value Ref Range Status  04/19/2021 6.9 3.4 - 10.8 x10E3/uL Final  01/12/2019 14.5 (H) 4.0 - 10.5 K/uL Final   RBC  Date Value Ref Range Status  04/19/2021 5.47 (H) 3.77 - 5.28 x10E6/uL Final  01/12/2019 5.30 (H) 3.87 - 5.11 MIL/uL Final   Hemoglobin  Date Value Ref Range Status  04/19/2021 14.2 11.1 - 15.9 g/dL Final   Hematocrit  Date Value Ref Range Status  04/19/2021 43.3 34.0 - 46.6 % Final   MCHC  Date Value Ref Range Status  04/19/2021 32.8 31.5 - 35.7 g/dL Final  01/12/2019 32.0 30.0 - 36.0 g/dL Final   [facility]  Date Value Ref Range Status  04/19/2021 26.0 (L) 26.6 - 33.0 pg Final  01/12/2019 26.2 26.0 - 34.0 pg Final   MCV  Date Value Ref Range Status  04/19/2021 79 79 - 97 fL Final   No results found for: "PLTCOUNTKUC", "LABPLAT", "POCPLA" RDW  Date Value Ref Range Status  04/19/2021 14.6 11.7 - 15.4 % Final          metoprolol succinate (TOPROL-XL) 100 MG 24 hr tablet [Pharmacy Med Name:  METOPROLOL SUCC ER 100 MG TAB] 90 tablet 4    Sig: TAKE 1 TABLET BY MOUTH EVERY DAY WITH OR IMMEDIATELY FOLLOWING A MEAL     Cardiovascular:  Beta Blockers Passed - 03/09/2022 10:49 AM      Passed - Last BP in normal range    BP Readings from Last 1 Encounters:  02/14/22 134/78         Passed - Last Heart Rate in normal range    Pulse Readings from Last 1 Encounters:  02/14/22 62          Passed - Valid encounter within last 6 months    Recent Outpatient Visits           3 weeks ago Type 2 diabetes mellitus with obesity ([place])   [place], Gadsden T, NP   1 month ago Type 2 diabetes mellitus with obesity ([place])   [place], [name] T, NP   4 months ago Type 2 diabetes mellitus with obesity ([place])   [place], [name] T, NP   7 months ago Type 2 diabetes mellitus with obesity ([place])   [place]rens, [name] T, NP   10 months ago Type 2 diabetes mellitus with obesity ([place])   [place], [name] T, NP               amLODipine (NORVASC) 5 MG tablet [Pharmacy Med Name: AMLODIPINE BESYLATE 5 MG TAB] 90 tablet 4    Sig: TAKE 1 TABLET (5 MG TOTAL) BY MOUTH DAILY.     Cardiovascular: Calcium Channel Blockers 2 Passed - 03/09/2022 10:49 AM      Passed - Last BP in normal range    BP Readings from Last 1 Encounters:  02/14/22 134/78         Passed - Last Heart Rate in normal range    Pulse Readings from Last 1 Encounters:  02/14/22 62         Passed - Valid encounter within last 6 months    Recent Outpatient Visits           3 weeks ago Type 2 diabetes mellitus with obesity ([place])   [place], Gren[place] T, NP   1 month ago Type 2 diabetes mellitus with obesity ([place])   [place], [name] T, NP   4 months ago Type 2 diabetes mellitus with obesity ([place])   [place], [name] T, NP   7 months ago Type 2 diabetes mellitus with obesity ([place])   [place], [name] T, NP   10 months ago Type 2 diabetes mellitus with obesity ([place])   [place], [name] T, NP               omeprazole (PRILOSEC) 20 MG capsule [Pharmacy Med Name: OMEPRAZOLE DR 20 MG CAPSULE] 90 capsule 4    Sig: TAKE 1 [place]     Gastroenterology: Proton Pump Inhibitors Passed -  03/09/2022 10:49 AM      Passed - Valid encounter within last 12 months    Recent Outpatient Visits           3 weeks ago Type 2 diabetes mellitus with obesity ([place])   [place], [name] T, NP   1 month ago Type 2 diabetes mellitus with obesity ([place])   [place], Kingsland T, NP   4 months ago  Type 2 diabetes mellitus with obesity ([place])   [place] Struthers, Jonestown T, NP   7 months ago Type 2 diabetes mellitus with obesity ([place])   [place]enton, [name] T, NP   10 months ago Type 2 diabetes mellitus with obesity ([place])   [place], [name] T, NP               meloxicam (MOBIC) 7.5 MG tablet [Pharmacy Med Name: MELOXICAM 7.5 MG TABLET] 90 tablet 4    Sig: TAKE 1 TABLET BY MOUTH EVERY DAY     Analgesics:  COX2 Inhibitors Failed - 03/09/2022 10:49 AM      Failed - Manual Review: Labs are only required if the patient has taken medication for more than 8 weeks.      Passed - HGB in normal range and within 360 days    Hemoglobin  Date Value Ref Range Status  04/19/2021 14.2 11.1 - 15.9 g/dL Final         Passed - Cr in normal range and within 360 days    Creatinine, Ser  Date Value Ref Range Status  01/14/2022 0.68 0.57 - 1.00 mg/dL Final         Passed - HCT in normal range and within 360 days    Hematocrit  Date Value Ref Range Status  04/19/2021 43.3 34.0 - 46.6 % Final         Passed - AST in normal range and within 360 days    AST  Date Value Ref Range Status  01/14/2022 15 0 - 40 IU/L Final         Passed - ALT in normal range and within 360 days    ALT  Date Value Ref Range Status  01/14/2022 22 0 - 32 IU/L Final         Passed - eGFR is 30 or above and within 360 days    GFR calc Af Amer  Date Value Ref Range Status  04/01/2020 95 >59 mL/min/1.73 Final    Comment:    **In accordance with recommendations from the NKF-ASN Task force,**   [facility] is in the  process of updating its eGFR calculation to the   2021 CKD-EPI creatinine equation that estimates kidney function   without a race variable.    GFR calc non Af Amer  Date Value Ref Range Status  04/01/2020 82 >59 mL/min/1.73 Final   eGFR  Date Value Ref Range Status  01/14/2022 97 >59 mL/min/1.73 Final         Passed - Patient is not pregnant      Passed - Valid encounter within last 12 months    Recent Outpatient Visits           3 weeks ago Type 2 diabetes mellitus with obesity ([place])   [place], [name] T, NP   1 month ago Type 2 diabetes mellitus with obesity ([place])   [place], [name] T, NP   4 months ago Type 2 diabetes mellitus with obesity ([place])   [place]head, [name] T, NP   7 months ago Type 2 diabetes mellitus with obesity ([place])   [place], [name] T, NP   10 months ago Type 2 diabetes mellitus with obesity ([place])   Pattison, [name] T, NP               telmisartan (MICARDIS) 80 MG tablet [Pharmacy Med  Name: TELMISARTAN 80 MG TABLET] 90 tablet 4    Sig: TAKE 1 TABLET BY MOUTH EVERY DAY     Cardiovascular:  Angiotensin Receptor Blockers Passed - 03/09/2022 10:49 AM      Passed - Cr in normal range and within 180 days    Creatinine, Ser  Date Value Ref Range Status  01/14/2022 0.68 0.57 - 1.00 mg/dL Final         Passed - K in normal range and within 180 days    Potassium  Date Value Ref Range Status  01/14/2022 4.3 3.5 - 5.2 mmol/L Final         Passed - Patient is not pregnant      Passed - Last BP in normal range    BP Readings from Last 1 Encounters:  02/14/22 134/78         Passed - Valid encounter within last 6 months    Recent Outpatient Visits           3 weeks ago Type 2 diabetes mellitus with obesity ([place])   [place], [name] T, NP   1 month ago Type 2 diabetes mellitus with obesity ([place])   [place], [name] T, NP   4 months ago Type 2 diabetes mellitus with obesity ([place])   [place]or, [name] T, NP   7 months ago Type 2 diabetes mellitus with obesity ([place])   Rowe, [name] T, NP   10 months ago Type 2 diabetes mellitus with obesity ([place])   [place] Cannady, [name] T, NP               atorvastatin (LIPITOR) 40 MG tablet [Pharmacy Med Name: ATORVASTATIN 40 MG TABLET] 90 tablet 3    Sig: TAKE 1 TABLET BY MOUTH EVERY DAY     Cardiovascular:  Antilipid - Statins Failed - 03/09/2022 10:49 AM      Failed - Lipid Panel in normal range within the last 12 months    Cholesterol, Total  Date Value Ref Range Status  01/14/2022 124 100 - 199 mg/dL Final   Cholesterol Piccolo, Waived  Date Value Ref Range Status  02/15/2019 130 <200 mg/dL Final    Comment:                            Desirable                <200                         Borderline High      200- 239                         High                     >239    LDL Chol Calc (NIH)  Date Value Ref Range Status  01/14/2022 44 0 - 99 mg/dL Final   HDL  Date Value Ref Range Status  01/14/2022 50 >39 mg/dL Final   Triglycerides  Date Value Ref Range Status  01/14/2022 184 (H) 0 - 149 mg/dL Final   Triglycerides Piccolo,Waived  Date Value Ref Range Status  02/15/2019 261 (H) <150 mg/dL Final    Comment:  Normal                   <150                         Borderline High     150 - 199                         High                200 - 499                         Very High                >499          Passed - Patient is not pregnant      Passed - Valid encounter within last 12 months    Recent Outpatient Visits           3 weeks ago Type 2 diabetes mellitus with obesity ([place])   [place], [name] T, NP   1 month ago Type 2 diabetes mellitus with obesity ([place])   [place], [name] T, NP   4 months ago Type 2 diabetes mellitus with obesity ([place])   [place], [name] T, NP   7 months ago Type 2 diabetes mellitus with obesity ([place])   S[place]ard, [name] T, NP   10 months ago Type 2 diabetes mellitus with obesity ([place])   [place]rop, [name], NP

## 2022-03-10 NOTE — Telephone Encounter (Signed)
A user error has taken place: encounter opened in error, closed for administrative reasons.

## 2022-03-10 NOTE — Telephone Encounter (Signed)
Patient would like to know if she can pick up her medication mentioned below.

## 2022-03-10 NOTE — Telephone Encounter (Signed)
Patient made aware of PCP's recommendations to stop taking Januvia

## 2022-03-11 ENCOUNTER — Telehealth: Payer: Self-pay | Admitting: Nurse Practitioner

## 2022-03-11 NOTE — Telephone Encounter (Signed)
Pt reports that she was told that the Rx for Ozempic is on backorder.

## 2022-03-14 NOTE — Telephone Encounter (Signed)
Left detailed message to make patient  aware of [name]'s recommendations to continue Januvia until Ozempic is back in stock and asked for a return call with any questions

## 2022-03-24 NOTE — Telephone Encounter (Signed)
Patient called in states willl try to see if a different pharmacy has the [place]

## 2022-03-26 ENCOUNTER — Other Ambulatory Visit: Payer: Self-pay | Admitting: Nurse Practitioner

## 2022-03-28 NOTE — Telephone Encounter (Signed)
Requested Prescriptions  Pending Prescriptions Disp Refills   metFORMIN (GLUCOPHAGE) 500 MG tablet [Pharmacy Med Name: METFORMIN HCL 500 MG TABLET] 360 tablet 4    Sig: TAKE 2 TABLETS (1,000 MG TOTAL) BY MOUTH 2 (TWO) TIMES DAILY WITH A MEAL.     Endocrinology:  Diabetes - Biguanides Failed - 03/26/2022  9:41 AM      Failed - HBA1C is between 0 and 7.9 and within 180 days    Hemoglobin A1C  Date Value Ref Range Status  10/27/2015 7.3%  Final   HB A1C (BAYER DCA - WAIVED)  Date Value Ref Range Status  01/14/2022 8.8 (H) 4.8 - 5.6 % Final    Comment:             Prediabetes: 5.7 - 6.4          Diabetes: >6.4          Glycemic control for adults with diabetes: <7.0          Failed - B12 Level in normal range and within 720 days    Vitamin B-12  Date Value Ref Range Status  06/03/2019 471 232 - 1,245 pg/mL Final         Passed - Cr in normal range and within 360 days    Creatinine, Ser  Date Value Ref Range Status  01/14/2022 0.68 0.57 - 1.00 mg/dL Final         Passed - eGFR in normal range and within 360 days    GFR calc Af Amer  Date Value Ref Range Status  04/01/2020 95 >59 mL/min/1.73 Final    Comment:    **In accordance with recommendations from the NKF-ASN Task force,**   [facility] is in the process of updating its eGFR calculation to the   2021 CKD-EPI creatinine equation that estimates kidney function   without a race variable.    GFR calc non Af Amer  Date Value Ref Range Status  04/01/2020 82 >59 mL/min/1.73 Final   eGFR  Date Value Ref Range Status  01/14/2022 97 >59 mL/min/1.73 Final         Passed - Valid encounter within last 6 months    Recent Outpatient Visits           1 month ago Type 2 diabetes mellitus with obesity ([place])   [place], Swartz Creek T, NP   2 months ago Type 2 diabetes mellitus with obesity ([place])   [place] Colony, Escondida T, NP   5 months ago Type 2 diabetes mellitus  with obesity ([place])   [place] Pea Ridge, Marcus Hook T, NP   8 months ago Type 2 diabetes mellitus with obesity ([place])   [place], Burke T, NP   11 months ago Type 2 diabetes mellitus with obesity ([place])   [place]ardeeville, Rembrandt T, NP              Passed - CBC within normal limits and completed in the last 12 months    WBC  Date Value Ref Range Status  04/19/2021 6.9 3.4 - 10.8 x10E3/uL Final  01/12/2019 14.5 (H) 4.0 - 10.5 K/uL Final   RBC  Date Value Ref Range Status  04/19/2021 5.47 (H) 3.77 - 5.28 x10E6/uL Final  01/12/2019 5.30 (H) 3.87 - 5.11 MIL/uL Final   Hemoglobin  Date Value Ref Range Status  04/19/2021 14.2 11.1 - 15.9 g/dL Final  Hematocrit  Date Value Ref Range Status  04/19/2021 43.3 34.0 - 46.6 % Final   MCHC  Date Value Ref Range Status  04/19/2021 32.8 31.5 - 35.7 g/dL Final  01/12/2019 32.0 30.0 - 36.0 g/dL Final   [facility]  Date Value Ref Range Status  04/19/2021 26.0 (L) 26.6 - 33.0 pg Final  01/12/2019 26.2 26.0 - 34.0 pg Final   MCV  Date Value Ref Range Status  04/19/2021 79 79 - 97 fL Final   No results found for: "PLTCOUNTKUC", "LABPLAT", "POCPLA" RDW  Date Value Ref Range Status  04/19/2021 14.6 11.7 - 15.4 % Final          amLODipine (NORVASC) 5 MG tablet [Pharmacy Med Name: AMLODIPINE BESYLATE 5 MG TAB] 90 tablet 4    Sig: TAKE 1 TABLET (5 MG TOTAL) BY MOUTH DAILY.     Cardiovascular: Calcium Channel Blockers 2 Passed - 03/26/2022  9:41 AM      Passed - Last BP in normal range    BP Readings from Last 1 Encounters:  02/14/22 134/78         Passed - Last Heart Rate in normal range    Pulse Readings from Last 1 Encounters:  02/14/22 62         Passed - Valid encounter within last 6 months    Recent Outpatient Visits           1 month ago Type 2 diabetes mellitus with obesity ([place])   [place], Twodot T,  NP   2 months ago Type 2 diabetes mellitus with obesity ([place])   [place], Wind Gap T, NP   5 months ago Type 2 diabetes mellitus with obesity ([place])   [place]cal[place] Ridge, Neuse Forest T, NP   8 months ago Type 2 diabetes mellitus with obesity ([place])   [place]der, Agra T, NP   11 months ago Type 2 diabetes mellitus with obesity ([place])   [place] Wheeler, Whitlash T, NP               meloxicam (MOBIC) 7.5 MG tablet [Pharmacy Med Name: MELOXICAM 7.5 MG TABLET] 90 tablet 4    Sig: TAKE 1 TABLET BY MOUTH EVERY DAY     Analgesics:  COX2 Inhibitors Failed - 03/26/2022  9:41 AM      Failed - Manual Review: Labs are only required if the patient has taken medication for more than 8 weeks.      Passed - HGB in normal range and within 360 days    Hemoglobin  Date Value Ref Range Status  04/19/2021 14.2 11.1 - 15.9 g/dL Final         Passed - Cr in normal range and within 360 days    Creatinine, Ser  Date Value Ref Range Status  01/14/2022 0.68 0.57 - 1.00 mg/dL Final         Passed - HCT in normal range and within 360 days    Hematocrit  Date Value Ref Range Status  04/19/2021 43.3 34.0 - 46.6 % Final         Passed - AST in normal range and within 360 days    AST  Date Value Ref Range Status  01/14/2022 15 0 - 40 IU/L Final         Passed - ALT in normal range and within 360 days    ALT  Date Value Ref Range Status  01/14/2022 22 0 - 32 IU/L Final         Passed - eGFR is 30 or above and within 360 days    GFR calc Af Amer  Date Value Ref Range Status  04/01/2020 95 >59 mL/min/1.73 Final    Comment:    **In accordance with recommendations from the NKF-ASN Task force,**   [facility] is in the process of updating its eGFR calculation to the   2021 CKD-EPI creatinine equation that estimates kidney function   without a race variable.    GFR calc non Af Amer   Date Value Ref Range Status  04/01/2020 82 >59 mL/min/1.73 Final   eGFR  Date Value Ref Range Status  01/14/2022 97 >59 mL/min/1.73 Final         Passed - Patient is not pregnant      Passed - Valid encounter within last 12 months    Recent Outpatient Visits           1 month ago Type 2 diabetes mellitus with obesity ([place])   [place] Arial, [place]remont T, NP   2 months ago Type 2 diabetes mellitus with obesity ([place])   [place], Dickerson City T, NP   5 months ago Type 2 diabetes mellitus with obesity ([place])   [place], Sac[place] Flats Village T, NP   8 months ago Type 2 diabetes mellitus with obesity ([place])   [place] Swisher, [name] T, NP   11 months ago Type 2 diabetes mellitus with obesity ([place]   [place]ony, [name] T, NP               telmisartan (MICARDIS) 80 MG tablet [Pharmacy Med Name: TELMISARTAN 80 MG TABLET] 90 tablet 4    Sig: TAKE 1 TABLET BY MOUTH EVERY DAY     Cardiovascular:  Angiotensin Receptor Blockers Passed - 03/26/2022  9:41 AM      Passed - Cr in normal range and within 180 days    Creatinine, Ser  Date Value Ref Range Status  01/14/2022 0.68 0.57 - 1.00 mg/dL Final         Passed - K in normal range and within 180 days    Potassium  Date Value Ref Range Status  01/14/2022 4.3 3.5 - 5.2 mmol/L Final         Passed - Patient is not pregnant      Passed - Last BP in normal range    BP Readings from Last 1 Encounters:  02/14/22 134/78         Passed - Valid encounter within last 6 months    Recent Outpatient Visits           1 month ago Type 2 diabetes mellitus with obesity ([place])   [place], [place]ud[place] Bonita T, NP   2 months ago Type 2 diabetes mellitus with obesity ([place])   [place] Equality, Wiota T, NP   5 months ago Type 2 diabetes mellitus with  obesity ([place])   [place]om[place], [place] Lansing T, NP   8 months ago Type 2 diabetes mellitus with obesity ([place])   [place], Herricks T, NP   11 months ago Type 2 diabetes mellitus with obesity ([place])   [place], [name], NP  atorvastatin (LIPITOR) 40 MG tablet [Pharmacy Med Name: ATORVASTATIN 40 MG TABLET] 90 tablet 3    Sig: TAKE 1 TABLET BY MOUTH EVERY DAY     Cardiovascular:  Antilipid - Statins Failed - 03/26/2022  9:41 AM      Failed - Lipid Panel in normal range within the last 12 months    Cholesterol, Total  Date Value Ref Range Status  01/14/2022 124 100 - 199 mg/dL Final   Cholesterol Piccolo, Waived  Date Value Ref Range Status  02/15/2019 130 <200 mg/dL Final    Comment:                            Desirable                <200                         Borderline High      200- 239                         High                     >239    LDL Chol Calc (NIH)  Date Value Ref Range Status  01/14/2022 44 0 - 99 mg/dL Final   HDL  Date Value Ref Range Status  01/14/2022 50 >39 mg/dL Final   Triglycerides  Date Value Ref Range Status  01/14/2022 184 (H) 0 - 149 mg/dL Final   Triglycerides Piccolo,Waived  Date Value Ref Range Status  02/15/2019 261 (H) <150 mg/dL Final    Comment:                            Normal                   <150                         Borderline High     150 - 199                         High                200 - 499                         Very High                >499          Passed - Patient is not pregnant      Passed - Valid encounter within last 12 months    Recent Outpatient Visits           1 month ago Type 2 diabetes mellitus with obesity ([place])   [place], Inez T, NP   2 months ago Type 2 diabetes mellitus with obesity ([place])   [place] Brule,  Cajah's Mountain T, NP   5 months ago Type 2 diabetes mellitus with obesity ([place])   [place] Leroy, Ekalaka T, NP   8 months ago Type 2 diabetes mellitus with obesity ([place])   [place] Moberly, Henrine Screws T, NP   11 months  ago Type 2 diabetes mellitus with obesity [facility])   [place] Brogan, [name], NP

## 2022-04-09 ENCOUNTER — Other Ambulatory Visit: Payer: Self-pay | Admitting: Nurse Practitioner

## 2022-04-11 NOTE — Telephone Encounter (Signed)
Unable to refill per protocol, Rx request is too soon. Last refill 04/09/21 for 90 and 4 refills.  Requested Prescriptions  Pending Prescriptions Disp Refills   omeprazole (PRILOSEC) 20 MG capsule [Pharmacy Med Name: OMEPRAZOLE DR 20 MG CAPSULE] 90 capsule 4    Sig: TAKE 1 CAPSULE BY MOUTH EVERY DAY     Gastroenterology: Proton Pump Inhibitors Passed - 04/09/2022  9:45 AM      Passed - Valid encounter within last 12 months    Recent Outpatient Visits           1 month ago Type 2 diabetes mellitus with obesity ([place])   [place] Fairview, Country Club Estates T, NP   2 months ago Type 2 diabetes mellitus with obesity ([place])   Barnhart Pomeroy, Gladstone T, NP   5 months ago Type 2 diabetes mellitus with obesity ([place])   L[place] Arthur Estates Bendena, Meire Grove T, NP   8 months ago Type 2 diabetes mellitus with obesity ([place])   [place] Congaree, Old Green T, NP   11 months ago Type 2 diabetes mellitus with obesity (Al[place])   [place] Fountain, Cresaptown T, NP               metoprolol succinate (TOPROL-XL) 100 MG 24 hr tablet [Pharmacy Med Name: METOPROLOL SUCC ER 100 MG TAB] 90 tablet 4    Sig: TAKE 1 TABLET BY MOUTH EVERY DAY WITH OR IMMEDIATELY FOLLOWING A MEAL     Cardiovascular:  Beta Blockers Passed - 04/09/2022  9:45 AM      Passed - Last BP in normal range    BP Readings from Last 1 Encounters:  02/14/22 134/78         Passed - Last Heart Rate in normal range    Pulse Readings from Last 1 Encounters:  02/14/22 62         Passed - Valid encounter within last 6 months    Recent Outpatient Visits           1 month ago Type 2 diabetes mellitus with obesity ([place])   [place] Ojus, Steele City T, NP   2 months ago Type 2 diabetes mellitus with obesity ([place])   Parker Punaluu, Golden T, NP   5 months ago Type 2 diabetes mellitus  with obesity ([place])   Fannett Willowick, Wahoo T, NP   8 months ago Type 2 diabetes mellitus with obesity ([place])   Victory Lakes Barneveld, Sanger T, NP   11 months ago Type 2 diabetes mellitus with obesity ([place])   Lance Creek Plummer, Stout T, NP               metFORMIN (GLUCOPHAGE) 500 MG tablet [Pharmacy Med Name: METFORMIN HCL 500 MG TABLET] 360 tablet 4    Sig: TAKE 2 TABLETS (1,000 MG TOTAL) BY MOUTH 2 (TWO) TIMES DAILY WITH A MEAL.     Endocrinology:  Diabetes - Biguanides Failed - 04/09/2022  9:45 AM      Failed - HBA1C is between 0 and 7.9 and within 180 days    Hemoglobin A1C  Date Value Ref Range Status  10/27/2015 7.3%  Final   HB A1C (BAYER DCA - WAIVED)  Date Value Ref Range Status  01/14/2022 8.8 (H) 4.8 - 5.6 % Final    Comment:  Prediabetes: 5.7 - 6.4          Diabetes: >6.4          Glycemic control for adults with diabetes: <7.0          Failed - B12 Level in normal range and within 720 days    Vitamin B-12  Date Value Ref Range Status  06/03/2019 471 232 - 1,245 pg/mL Final         Passed - Cr in normal range and within 360 days    Creatinine, Ser  Date Value Ref Range Status  01/14/2022 0.68 0.57 - 1.00 mg/dL Final         Passed - eGFR in normal range and within 360 days    GFR calc Af Amer  Date Value Ref Range Status  04/01/2020 95 >59 mL/min/1.73 Final    Comment:    **In accordance with recommendations from the NKF-ASN Task force,**   [facility] is in the process of updating its eGFR calculation to the   2021 CKD-EPI creatinine equation that estimates kidney function   without a race variable.    GFR calc non Af Amer  Date Value Ref Range Status  04/01/2020 82 >59 mL/min/1.73 Final   eGFR  Date Value Ref Range Status  01/14/2022 97 >59 mL/min/1.73 Final         Passed - Valid encounter within last 6 months    Recent Outpatient Visits           1  month ago Type 2 diabetes mellitus with obesity ([place])   [place] Cantrall, Pleasant City T, NP   2 months ago Type 2 diabetes mellitus with obesity ([place])   Hayward Bladensburg, Vanceboro T, NP   5 months ago Type 2 diabetes mellitus with obesity ([place])   [place] Leighton, Bridgeport T, NP   8 months ago Type 2 diabetes mellitus with obesity ([place])   Ladora Pungoteague, West Bountiful T, NP   11 months ago Type 2 diabetes mellitus with obesity ([place])   Com[place] Northwest Oak Level, Sperryville T, NP              Passed - CBC within normal limits and completed in the last 12 months    WBC  Date Value Ref Range Status  04/19/2021 6.9 3.4 - 10.8 x10E3/uL Final  01/12/2019 14.5 (H) 4.0 - 10.5 K/uL Final   RBC  Date Value Ref Range Status  04/19/2021 5.47 (H) 3.77 - 5.28 x10E6/uL Final  01/12/2019 5.30 (H) 3.87 - 5.11 MIL/uL Final   Hemoglobin  Date Value Ref Range Status  04/19/2021 14.2 11.1 - 15.9 g/dL Final   Hematocrit  Date Value Ref Range Status  04/19/2021 43.3 34.0 - 46.6 % Final   MCHC  Date Value Ref Range Status  04/19/2021 32.8 31.5 - 35.7 g/dL Final  01/12/2019 32.0 30.0 - 36.0 g/dL Final   [facility]  Date Value Ref Range Status  04/19/2021 26.0 (L) 26.6 - 33.0 pg Final  01/12/2019 26.2 26.0 - 34.0 pg Final   MCV  Date Value Ref Range Status  04/19/2021 79 79 - 97 fL Final   No results found for: "PLTCOUNTKUC", "LABPLAT", "POCPLA" RDW  Date Value Ref Range Status  04/19/2021 14.6 11.7 - 15.4 % Final          amLODipine (NORVASC) 5 MG tablet [Pharmacy Med Name: AMLODIPINE BESYLATE 5 MG TAB] 90  tablet 4    Sig: TAKE 1 TABLET (5 MG TOTAL) BY MOUTH DAILY.     Cardiovascular: Calcium Channel Blockers 2 Passed - 04/09/2022  9:45 AM      Passed - Last BP in normal range    BP Readings from Last 1 Encounters:  02/14/22 134/78         Passed - Last Heart Rate in normal  range    Pulse Readings from Last 1 Encounters:  02/14/22 62         Passed - Valid encounter within last 6 months    Recent Outpatient Visits           1 month ago Type 2 diabetes mellitus with obesity ([place])   [place] Plumas Eureka, Mooresville T, NP   2 months ago Type 2 diabetes mellitus with obesity ([place])   U[place] Oasis, Braham T, NP   5 months ago Type 2 diabetes mellitus with obesity ([place])   [place] Anzac Village, Burdett T, NP   8 months ago Type 2 diabetes mellitus with obesity ([place]ee[place])   Corona Wilson, Osage T, NP   11 months ago Type 2 diabetes mellitus with obesity ([place]ates[place])   Glen Allen Kellerton, Imbler T, NP               atorvastatin (LIPITOR) 40 MG tablet [Pharmacy Med Name: ATORVASTATIN 40 MG TABLET] 90 tablet 3    Sig: TAKE 1 TABLET BY MOUTH EVERY DAY     Cardiovascular:  Antilipid - Statins Failed - 04/09/2022  9:45 AM      Failed - Lipid Panel in normal range within the last 12 months    Cholesterol, Total  Date Value Ref Range Status  01/14/2022 124 100 - 199 mg/dL Final   Cholesterol Piccolo, Waived  Date Value Ref Range Status  02/15/2019 130 <200 mg/dL Final    Comment:                            Desirable                <200                         Borderline High      200- 239                         High                     >239    LDL Chol Calc (NIH)  Date Value Ref Range Status  01/14/2022 44 0 - 99 mg/dL Final   HDL  Date Value Ref Range Status  01/14/2022 50 >39 mg/dL Final   Triglycerides  Date Value Ref Range Status  01/14/2022 184 (H) 0 - 149 mg/dL Final   Triglycerides Piccolo,Waived  Date Value Ref Range Status  02/15/2019 261 (H) <150 mg/dL Final    Comment:                            Normal                   <150  Borderline High     150 - 199                         High                 200 - 499                         Very High                >499          Passed - Patient is not pregnant      Passed - Valid encounter within last 12 months    Recent Outpatient Visits           1 month ago Type 2 diabetes mellitus with obesity (Cor[place])   Critt[place] Metamora, Sedalia T, NP   2 months ago Type 2 diabetes mellitus with obesity ([place])   Falconer Eldred, Silver Lake T, NP   5 months ago Type 2 diabetes mellitus with obesity ([place]asc[place])   Moorland Druid Hills, La Feria North T, NP   8 months ago Type 2 diabetes mellitus with obesity ([place])   Wines[place] Chancellor, Lexington T, NP   11 months ago Type 2 diabetes mellitus with obesity ([place])   Valley View [address], [name] T, NP               telmisartan (MICARDIS) 80 MG tablet [Pharmacy Med Name: TELMISARTAN 80 MG TABLET] 90 tablet 4    Sig: TAKE 1 TABLET BY MOUTH EVERY DAY     Cardiovascular:  Angiotensin Receptor Blockers Passed - 04/09/2022  9:45 AM      Passed - Cr in normal range and within 180 days    Creatinine, Ser  Date Value Ref Range Status  01/14/2022 0.68 0.57 - 1.00 mg/dL Final         Passed - K in normal range and within 180 days    Potassium  Date Value Ref Range Status  01/14/2022 4.3 3.5 - 5.2 mmol/L Final         Passed - Patient is not pregnant      Passed - Last BP in normal range    BP Readings from Last 1 Encounters:  02/14/22 134/78         Passed - Valid encounter within last 6 months    Recent Outpatient Visits           1 month ago Type 2 diabetes mellitus with obesity ([place])   Dousman Tye, Diaperville T, NP   2 months ago Type 2 diabetes mellitus with obesity ([place])   [place] Hallowell, Greenville T, NP   5 months ago Type 2 diabetes mellitus with obesity (L[place])   Saugert[place] South Rock Island, Sea Ranch Lakes  T, NP   8 months ago Type 2 diabetes mellitus with obesity ([place])   Ply[place] Mount Pleasant, Graysville T, NP   11 months ago Type 2 diabetes mellitus with obesity ([place])   [place]abro[place] Bonney, [name], NP

## 2022-04-12 ENCOUNTER — Other Ambulatory Visit: Payer: Self-pay | Admitting: Nurse Practitioner

## 2022-04-12 ENCOUNTER — Telehealth: Payer: Self-pay | Admitting: Nurse Practitioner

## 2022-04-12 MED ORDER — AMLODIPINE BESYLATE 5 MG PO TABS: 5.0000 mg | ORAL_TABLET | Freq: Every day | ORAL | 4 refills | Status: DC

## 2022-04-12 NOTE — Telephone Encounter (Signed)
Copied from [place] [phone]. Topic: General - Other >> Apr 12, 2022 10:36 AM Cyndi Bender wrote: Reason for CRM: Pt reports that [facility] informed [facility] that the Rx for amLODipine (NORVASC) 5 MG tablet needs authorization. Cb# [phone]

## 2022-04-12 NOTE — Telephone Encounter (Signed)
Medication refill for Amlodipine '5mg'$  last ov 02/14/22, upcoming ov 04/19/22 . Please advise

## 2022-04-13 NOTE — Telephone Encounter (Signed)
Rx 05/07/18 #[id number] request Requested Prescriptions  Pending Prescriptions Disp Refills   amLODipine (NORVASC) 5 MG tablet [Pharmacy Med Name: AMLODIPINE BESYLATE 5 MG TAB] 90 tablet 4    Sig: TAKE 1 TABLET (5 MG TOTAL) BY MOUTH DAILY.     Cardiovascular: Calcium Channel Blockers 2 Passed - 04/12/2022 10:33 AM      Passed - Last BP in normal range    BP Readings from Last 1 Encounters:  02/14/22 134/78         Passed - Last Heart Rate in normal range    Pulse Readings from Last 1 Encounters:  02/14/22 62         Passed - Valid encounter within last 6 months    Recent Outpatient Visits           1 month ago Type 2 diabetes mellitus with obesity ([place])   Lim[place] Nettleton, Lakeside T, NP   2 months ago Type 2 diabetes mellitus with obesity (Bayside)   [place]o [place] California, Six Shooter Canyon T, NP   6 months ago Type 2 diabetes mellitus with obesity ([place])   Palmer Linden, Lincolndale T, NP   8 months ago Type 2 diabetes mellitus with obesity ([place])   Runnels Omao, Bunker Hill T, NP   11 months ago Type 2 diabetes mellitus with obesity (P[place] City)   [place] Gouldtown, [name], NP

## 2022-04-16 ENCOUNTER — Other Ambulatory Visit: Payer: Self-pay | Admitting: Nurse Practitioner

## 2022-04-17 NOTE — Patient Instructions (Incomplete)

## 2022-04-18 NOTE — Telephone Encounter (Signed)
Requested Prescriptions  Pending Prescriptions Disp Refills   metFORMIN (GLUCOPHAGE) 500 MG tablet [Pharmacy Med Name: METFORMIN HCL 500 MG TABLET] 360 tablet 0    Sig: TAKE 2 TABLETS (1,000 MG TOTAL) BY MOUTH 2 (TWO) TIMES DAILY WITH A MEAL.     Endocrinology:  Diabetes - Biguanides Failed - 04/16/2022 10:28 AM      Failed - HBA1C is between 0 and 7.9 and within 180 days    Hemoglobin A1C  Date Value Ref Range Status  10/27/2015 7.3%  Final   HB A1C (BAYER DCA - WAIVED)  Date Value Ref Range Status  01/14/2022 8.8 (H) 4.8 - 5.6 % Final    Comment:             Prediabetes: 5.7 - 6.4          Diabetes: >6.4          Glycemic control for adults with diabetes: <7.0          Failed - B12 Level in normal range and within 720 days    Vitamin B-12  Date Value Ref Range Status  06/03/2019 471 232 - 1,245 pg/mL Final         Failed - CBC within normal limits and completed in the last 12 months    WBC  Date Value Ref Range Status  04/19/2021 6.9 3.4 - 10.8 x10E3/uL Final  01/12/2019 14.5 (H) 4.0 - 10.5 K/uL Final   RBC  Date Value Ref Range Status  04/19/2021 5.47 (H) 3.77 - 5.28 x10E6/uL Final  01/12/2019 5.30 (H) 3.87 - 5.11 MIL/uL Final   Hemoglobin  Date Value Ref Range Status  04/19/2021 14.2 11.1 - 15.9 g/dL Final   Hematocrit  Date Value Ref Range Status  04/19/2021 43.3 34.0 - 46.6 % Final   MCHC  Date Value Ref Range Status  04/19/2021 32.8 31.5 - 35.7 g/dL Final  01/12/2019 32.0 30.0 - 36.0 g/dL Final   [facility]  Date Value Ref Range Status  04/19/2021 26.0 (L) 26.6 - 33.0 pg Final  01/12/2019 26.2 26.0 - 34.0 pg Final   MCV  Date Value Ref Range Status  04/19/2021 79 79 - 97 fL Final   No results found for: "PLTCOUNTKUC", "LABPLAT", "POCPLA" RDW  Date Value Ref Range Status  04/19/2021 14.6 11.7 - 15.4 % Final         Passed - Cr in normal range and within 360 days    Creatinine, Ser  Date Value Ref Range Status  01/14/2022 0.68 0.57 - 1.00 mg/dL  Final         Passed - eGFR in normal range and within 360 days    GFR calc Af Amer  Date Value Ref Range Status  04/01/2020 95 >59 mL/min/1.73 Final    Comment:    **In accordance with recommendations from the NKF-ASN Task force,**   [facility] is in the process of updating its eGFR calculation to the   2021 CKD-EPI creatinine equation that estimates kidney function   without a race variable.    GFR calc non Af Amer  Date Value Ref Range Status  04/01/2020 82 >59 mL/min/1.73 Final   eGFR  Date Value Ref Range Status  01/14/2022 97 >59 mL/min/1.73 Final         Passed - Valid encounter within last 6 months    Recent Outpatient Visits           2 months ago Type 2 diabetes mellitus with obesity (Grand [place])  Imperial Avalon, Portland T, NP   3 months ago Type 2 diabetes mellitus with obesity (C[place])   Sylvan Beach Grand Blanc, Stotesbury T, NP   6 months ago Type 2 diabetes mellitus with obesity (Ke[place])   [place] Centro Pine Ridge, Meckling T, NP   9 months ago Type 2 diabetes mellitus with obesity (V[place])   Rancho Mesa Verde Cannon Beach, Mobile T, NP   12 months ago Type 2 diabetes mellitus with obesity (Bro[place])   Jane Lew Heath Springs, [name] T, NP               telmisartan (MICARDIS) 80 MG tablet [Pharmacy Med Name: TELMISARTAN 80 MG TABLET] 90 tablet 0    Sig: TAKE 1 TABLET BY MOUTH EVERY DAY     Cardiovascular:  Angiotensin Receptor Blockers Passed - 04/16/2022 10:28 AM      Passed - Cr in normal range and within 180 days    Creatinine, Ser  Date Value Ref Range Status  01/14/2022 0.68 0.57 - 1.00 mg/dL Final         Passed - K in normal range and within 180 days    Potassium  Date Value Ref Range Status  01/14/2022 4.3 3.5 - 5.2 mmol/L Final         Passed - Patient is not pregnant      Passed - Last BP in normal range    BP Readings from Last 1 Encounters:  02/14/22  134/78         Passed - Valid encounter within last 6 months    Recent Outpatient Visits           2 months ago Type 2 diabetes mellitus with obesity ([place])   Irving[place] Superior, Chadds Ford T, NP   3 months ago Type 2 diabetes mellitus with obesity (Itaw[place])   Wars[place] St. Marys, Dry Creek T, NP   6 months ago Type 2 diabetes mellitus with obesity ([place]isure City)   New [place] Beresford, Tonasket T, NP   9 months ago Type 2 diabetes mellitus with obesity ([place])   Chautauqua Country Club Heights, Terra Bella T, NP   12 months ago Type 2 diabetes mellitus with obesity (Whit[place])   Oliver Dixonville, Cruger T, NP               metoprolol succinate (TOPROL-XL) 100 MG 24 hr tablet [Pharmacy Med Name: METOPROLOL SUCC ER 100 MG TAB] 90 tablet 0    Sig: TAKE 1 TABLET BY MOUTH EVERY DAY WITH OR IMMEDIATELY FOLLOWING A MEAL     Cardiovascular:  Beta Blockers Passed - 04/16/2022 10:28 AM      Passed - Last BP in normal range    BP Readings from Last 1 Encounters:  02/14/22 134/78         Passed - Last Heart Rate in normal range    Pulse Readings from Last 1 Encounters:  02/14/22 62         Passed - Valid encounter within last 6 months    Recent Outpatient Visits           2 months ago Type 2 diabetes mellitus with obesity ([place])   Sunnyvale Aurora, Brashear T, NP   3 months ago Type 2 diabetes mellitus with obesity [facility])   Bristow Kickapoo Site 7, Erda T,  NP   6 months ago Type 2 diabetes mellitus with obesity (Paint [place])   Lake Caroline De Valls Bluff, Osceola T, NP   9 months ago Type 2 diabetes mellitus with obesity ([place])   Port Clarence Fairmont, Buckner T, NP   12 months ago Type 2 diabetes mellitus with obesity (Geuda Springs)   Gurley Browns Lake, [name] T, NP               atorvastatin  (LIPITOR) 40 MG tablet [Pharmacy Med Name: ATORVASTATIN 40 MG TABLET] 90 tablet 3    Sig: TAKE 1 TABLET BY MOUTH EVERY DAY     Cardiovascular:  Antilipid - Statins Failed - 04/16/2022 10:28 AM      Failed - Lipid Panel in normal range within the last 12 months    Cholesterol, Total  Date Value Ref Range Status  01/14/2022 124 100 - 199 mg/dL Final   Cholesterol Piccolo, Waived  Date Value Ref Range Status  02/15/2019 130 <200 mg/dL Final    Comment:                            Desirable                <200                         Borderline High      200- 239                         High                     >239    LDL Chol Calc (NIH)  Date Value Ref Range Status  01/14/2022 44 0 - 99 mg/dL Final   HDL  Date Value Ref Range Status  01/14/2022 50 >39 mg/dL Final   Triglycerides  Date Value Ref Range Status  01/14/2022 184 (H) 0 - 149 mg/dL Final   Triglycerides Piccolo,Waived  Date Value Ref Range Status  02/15/2019 261 (H) <150 mg/dL Final    Comment:                            Normal                   <150                         Borderline High     150 - 199                         High                200 - 499                         Very High                >499          Passed - Patient is not pregnant      Passed - Valid encounter within last 12 months    Recent Outpatient Visits           2 months ago Type 2 diabetes mellitus with obesity ([place])  Rogers[place] Buffalo, Osborne T, NP   3 months ago Type 2 diabetes mellitus with obesity (Scalp Level)   Medina Orleans, Union Springs T, NP   6 months ago Type 2 diabetes mellitus with obesity (H[place])   [place] Goodyear Village, Lyndhurst T, NP   9 months ago Type 2 diabetes mellitus with obesity (Cassville)   [place] Spring Gardens, Gibson T, NP   12 months ago Type 2 diabetes mellitus with obesity (Tylers[place])   Divernon  Trafford, Hostetter T, NP               omeprazole (PRILOSEC) 20 MG capsule [Pharmacy Med Name: OMEPRAZOLE DR 20 MG CAPSULE] 90 capsule 4    Sig: TAKE 1 [place]     Gastroenterology: Proton Pump Inhibitors Passed - 04/16/2022 10:28 AM      Passed - Valid encounter within last 12 months    Recent Outpatient Visits           2 months ago Type 2 diabetes mellitus with obesity ([place])   Sudden Valley Elkhart Lake, Hiram T, NP   3 months ago Type 2 diabetes mellitus with obesity (Burg[place])   Lake Dalecarlia East View, Celina T, NP   6 months ago Type 2 diabetes mellitus with obesity (McLean)   Seligman Turner, Kennett Square T, NP   9 months ago Type 2 diabetes mellitus with obesity (Christopher)   Jenera Hosmer, Goreville T, NP   12 months ago Type 2 diabetes mellitus with obesity (Datil)   Covington Burnsville, San Juan T, NP               meloxicam (MOBIC) 7.5 MG tablet [Pharmacy Med Name: MELOXICAM 7.5 MG TABLET] 90 tablet 4    Sig: TAKE 1 TABLET BY MOUTH EVERY DAY     Analgesics:  COX2 Inhibitors Failed - 04/16/2022 10:28 AM      Failed - Manual Review: Labs are only required if the patient has taken medication for more than 8 weeks.      Failed - HGB in normal range and within 360 days    Hemoglobin  Date Value Ref Range Status  04/19/2021 14.2 11.1 - 15.9 g/dL Final         Failed - HCT in normal range and within 360 days    Hematocrit  Date Value Ref Range Status  04/19/2021 43.3 34.0 - 46.6 % Final         Passed - Cr in normal range and within 360 days    Creatinine, Ser  Date Value Ref Range Status  01/14/2022 0.68 0.57 - 1.00 mg/dL Final         Passed - AST in normal range and within 360 days    AST  Date Value Ref Range Status  01/14/2022 15 0 - 40 IU/L Final         Passed - ALT in normal range and within 360 days    ALT  Date Value Ref  Range Status  01/14/2022 22 0 - 32 IU/L Final         Passed - eGFR is 30 or above and within 360 days    GFR calc Af Amer  Date Value Ref Range Status  04/01/2020 95 >59 mL/min/1.73 Final    Comment:    **In  accordance with recommendations from the NKF-ASN Task force,**   [facility] is in the process of updating its eGFR calculation to the   2021 CKD-EPI creatinine equation that estimates kidney function   without a race variable.    GFR calc non Af Amer  Date Value Ref Range Status  04/01/2020 82 >59 mL/min/1.73 Final   eGFR  Date Value Ref Range Status  01/14/2022 97 >59 mL/min/1.73 Final         Passed - Patient is not pregnant      Passed - Valid encounter within last 12 months    Recent Outpatient Visits           2 months ago Type 2 diabetes mellitus with obesity (Arb[place])   Kempton Bryson City, San Jacinto T, NP   3 months ago Type 2 diabetes mellitus with obesity (Hebron)   Obion Frenchburg, Susan Moore T, NP   6 months ago Type 2 diabetes mellitus with obesity ([place])   Mount Prospect Lynnwood, Long Beach T, NP   9 months ago Type 2 diabetes mellitus with obesity (Brun[place])   Glendale Canutillo, Beallsville T, NP   12 months ago Type 2 diabetes mellitus with obesity (Be[place])   T[place]on Tahlequah, [name], NP

## 2022-04-18 NOTE — Telephone Encounter (Signed)
Rxs were sent to pharmacy on 10/15/21 #90/3, and 07/21/21 #90/4.   Requested Prescriptions  Pending Prescriptions Disp Refills   meloxicam (MOBIC) 7.5 MG tablet [Pharmacy Med Name: MELOXICAM 7.5 MG TABLET] 90 tablet 4    Sig: TAKE 1 TABLET BY MOUTH EVERY DAY     Analgesics:  COX2 Inhibitors Failed - 04/16/2022 10:28 AM      Failed - Manual Review: Labs are only required if the patient has taken medication for more than 8 weeks.      Failed - HGB in normal range and within 360 days    Hemoglobin  Date Value Ref Range Status  04/19/2021 14.2 11.1 - 15.9 g/dL Final         Failed - HCT in normal range and within 360 days    Hematocrit  Date Value Ref Range Status  04/19/2021 43.3 34.0 - 46.6 % Final         Passed - Cr in normal range and within 360 days    Creatinine, Ser  Date Value Ref Range Status  01/14/2022 0.68 0.57 - 1.00 mg/dL Final         Passed - AST in normal range and within 360 days    AST  Date Value Ref Range Status  01/14/2022 15 0 - 40 IU/L Final         Passed - ALT in normal range and within 360 days    ALT  Date Value Ref Range Status  01/14/2022 22 0 - 32 IU/L Final         Passed - eGFR is 30 or above and within 360 days    GFR calc Af Amer  Date Value Ref Range Status  04/01/2020 95 >59 mL/min/1.73 Final    Comment:    **In accordance with recommendations from the NKF-ASN Task force,**   [facility] is in the process of updating its eGFR calculation to the   2021 CKD-EPI creatinine equation that estimates kidney function   without a race variable.    GFR calc non Af Amer  Date Value Ref Range Status  04/01/2020 82 >59 mL/min/1.73 Final   eGFR  Date Value Ref Range Status  01/14/2022 97 >59 mL/min/1.73 Final         Passed - Patient is not pregnant      Passed - Valid encounter within last 12 months    Recent Outpatient Visits           2 months ago Type 2 diabetes mellitus with obesity ([place])   [place]  [place], Riggston T, NP   3 months ago Type 2 diabetes mellitus with obesity ([place])   [place] Lake, Brookshire T, NP   6 months ago Type 2 diabetes mellitus with obesity ([place])   Halma Nespelem Community, Kirby T, NP   9 months ago Type 2 diabetes mellitus with obesity ([place])   Wause[place] Cliftondale Park, Flournoy T, NP   12 months ago Type 2 diabetes mellitus with obesity ([place])   Alvord Cheviot, Lake Buena Vista T, NP              Signed Prescriptions Disp Refills   metFORMIN (GLUCOPHAGE) 500 MG tablet 360 tablet 0    Sig: TAKE 2 TABLETS (1,000 MG TOTAL) BY MOUTH 2 (TWO) TIMES DAILY WITH A MEAL.     Endocrinology:  Diabetes - Biguanides Failed - 04/16/2022 10:28 AM  Failed - HBA1C is between 0 and 7.9 and within 180 days    Hemoglobin A1C  Date Value Ref Range Status  10/27/2015 7.3%  Final   HB A1C (BAYER DCA - WAIVED)  Date Value Ref Range Status  01/14/2022 8.8 (H) 4.8 - 5.6 % Final    Comment:             Prediabetes: 5.7 - 6.4          Diabetes: >6.4          Glycemic control for adults with diabetes: <7.0          Failed - B12 Level in normal range and within 720 days    Vitamin B-12  Date Value Ref Range Status  06/03/2019 471 232 - 1,245 pg/mL Final         Failed - CBC within normal limits and completed in the last 12 months    WBC  Date Value Ref Range Status  04/19/2021 6.9 3.4 - 10.8 x10E3/uL Final  01/12/2019 14.5 (H) 4.0 - 10.5 K/uL Final   RBC  Date Value Ref Range Status  04/19/2021 5.47 (H) 3.77 - 5.28 x10E6/uL Final  01/12/2019 5.30 (H) 3.87 - 5.11 MIL/uL Final   Hemoglobin  Date Value Ref Range Status  04/19/2021 14.2 11.1 - 15.9 g/dL Final   Hematocrit  Date Value Ref Range Status  04/19/2021 43.3 34.0 - 46.6 % Final   MCHC  Date Value Ref Range Status  04/19/2021 32.8 31.5 - 35.7 g/dL Final  01/12/2019 32.0 30.0 - 36.0 g/dL Final   [facility]  Date  Value Ref Range Status  04/19/2021 26.0 (L) 26.6 - 33.0 pg Final  01/12/2019 26.2 26.0 - 34.0 pg Final   MCV  Date Value Ref Range Status  04/19/2021 79 79 - 97 fL Final   No results found for: "PLTCOUNTKUC", "LABPLAT", "POCPLA" RDW  Date Value Ref Range Status  04/19/2021 14.6 11.7 - 15.4 % Final         Passed - Cr in normal range and within 360 days    Creatinine, Ser  Date Value Ref Range Status  01/14/2022 0.68 0.57 - 1.00 mg/dL Final         Passed - eGFR in normal range and within 360 days    GFR calc Af Amer  Date Value Ref Range Status  04/01/2020 95 >59 mL/min/1.73 Final    Comment:    **In accordance with recommendations from the NKF-ASN Task force,**   [facility] is in the process of updating its eGFR calculation to the   2021 CKD-EPI creatinine equation that estimates kidney function   without a race variable.    GFR calc non Af Amer  Date Value Ref Range Status  04/01/2020 82 >59 mL/min/1.73 Final   eGFR  Date Value Ref Range Status  01/14/2022 97 >59 mL/min/1.73 Final         Passed - Valid encounter within last 6 months    Recent Outpatient Visits           2 months ago Type 2 diabetes mellitus with obesity ([place])   [place] Harrold, New Castle T, NP   3 months ago Type 2 diabetes mellitus with obesity ([place])   [place] Gran[place] Stateline, Fairfield T, NP   6 months ago Type 2 diabetes mellitus with obesity ([place])   Donip[place] Mount Repose, Exeter T, NP   9 months ago Type 2  diabetes mellitus with obesity ([place])   Donora Rices Landing, Collierville T, NP   12 months ago Type 2 diabetes mellitus with obesity ([place])   [place]al[place] Kingsville, [name] T, NP               telmisartan (MICARDIS) 80 MG tablet 90 tablet 0    Sig: TAKE 1 TABLET BY MOUTH EVERY DAY     Cardiovascular:  Angiotensin Receptor Blockers Passed - 04/16/2022 10:28 AM      Passed -  Cr in normal range and within 180 days    Creatinine, Ser  Date Value Ref Range Status  01/14/2022 0.68 0.57 - 1.00 mg/dL Final         Passed - K in normal range and within 180 days    Potassium  Date Value Ref Range Status  01/14/2022 4.3 3.5 - 5.2 mmol/L Final         Passed - Patient is not pregnant      Passed - Last BP in normal range    BP Readings from Last 1 Encounters:  02/14/22 134/78         Passed - Valid encounter within last 6 months    Recent Outpatient Visits           2 months ago Type 2 diabetes mellitus with obesity ([place])   April Johnson, Kent Acres T, NP   3 months ago Type 2 diabetes mellitus with obesity ([place])   Thom[place] Lake Stickney, Mechanicsburg T, NP   6 months ago Type 2 diabetes mellitus with obesity ([place])   Riverbend Sterling City, Bangor T, NP   9 months ago Type 2 diabetes mellitus with obesity ([place])   Austin Milledgeville, Grand View-on-Hudson T, NP   12 months ago Type 2 diabetes mellitus with obesity ([place])   Milroy White River Junction, Muskogee T, NP               metoprolol succinate (TOPROL-XL) 100 MG 24 hr tablet 90 tablet 0    Sig: TAKE 1 TABLET BY MOUTH EVERY DAY WITH OR IMMEDIATELY FOLLOWING A MEAL     Cardiovascular:  Beta Blockers Passed - 04/16/2022 10:28 AM      Passed - Last BP in normal range    BP Readings from Last 1 Encounters:  02/14/22 134/78         Passed - Last Heart Rate in normal range    Pulse Readings from Last 1 Encounters:  02/14/22 62         Passed - Valid encounter within last 6 months    Recent Outpatient Visits           2 months ago Type 2 diabetes mellitus with obesity ([place])   [place]ice[place] Driscoll, Mendon T, NP   3 months ago Type 2 diabetes mellitus with obesity ([place])   Pocono Woodland Lakes Gilbert, Hamilton T, NP   6 months ago Type 2 diabetes mellitus with  obesity ([place])   Port E[place] Glen White, Sandy Hook T, NP   9 months ago Type 2 diabetes mellitus with obesity ([place])   Kempton Veyo, Custer Park T, NP   12 months ago Type 2 diabetes mellitus with obesity ([place])   Panther [place] Mount Bullion, [name], NP  Refused Prescriptions Disp Refills   atorvastatin (LIPITOR) 40 MG tablet [Pharmacy Med Name: ATORVASTATIN 40 MG TABLET] 90 tablet 3    Sig: TAKE 1 TABLET BY MOUTH EVERY DAY     Cardiovascular:  Antilipid - Statins Failed - 04/16/2022 10:28 AM      Failed - Lipid Panel in normal range within the last 12 months    Cholesterol, Total  Date Value Ref Range Status  01/14/2022 124 100 - 199 mg/dL Final   Cholesterol Piccolo, Waived  Date Value Ref Range Status  02/15/2019 130 <200 mg/dL Final    Comment:                            Desirable                <200                         Borderline High      200- 239                         High                     >239    LDL Chol Calc (NIH)  Date Value Ref Range Status  01/14/2022 44 0 - 99 mg/dL Final   HDL  Date Value Ref Range Status  01/14/2022 50 >39 mg/dL Final   Triglycerides  Date Value Ref Range Status  01/14/2022 184 (H) 0 - 149 mg/dL Final   Triglycerides Piccolo,Waived  Date Value Ref Range Status  02/15/2019 261 (H) <150 mg/dL Final    Comment:                            Normal                   <150                         Borderline High     150 - 199                         High                200 - 499                         Very High                >499          Passed - Patient is not pregnant      Passed - Valid encounter within last 12 months    Recent Outpatient Visits           2 months ago Type 2 diabetes mellitus with obesity ([place])   Lafitte Bristow, Silver City T, NP   3 months ago Type 2 diabetes mellitus with obesity ([place])   Lebanon Valley Falls, Burns T, NP   6 months ago Type 2 diabetes mellitus with obesity ([place])   Goldstream Olivet, Holden T, NP   9 months ago Type 2 diabetes mellitus with obesity [facility])   Champlin Hoytville, Stonybrook T,  NP   12 months ago Type 2 diabetes mellitus with obesity ([place])   Jefferson Fountain Hill, Belleplain T, NP               omeprazole (PRILOSEC) 20 MG capsule [Pharmacy Med Name: OMEPRAZOLE DR 20 MG CAPSULE] 90 capsule 4    Sig: TAKE [place]     Gastroenterology: Proton Pump Inhibitors Passed - 04/16/2022 10:28 AM      Passed - Valid encounter within last 12 months    Recent Outpatient Visits           2 months ago Type 2 diabetes mellitus with obesity ([place])   Cleona Modoc, Harwich Center T, NP   3 months ago Type 2 diabetes mellitus with obesity ([place])   Clayville Hart, Wadsworth T, NP   6 months ago Type 2 diabetes mellitus with obesity ([place])   Delight Waterview, Sackets Harbor T, NP   9 months ago Type 2 diabetes mellitus with obesity ([place])   Midway Hamilton, Tampico T, NP   12 months ago Type 2 diabetes mellitus with obesity ([place])   [place]osedale, [name], NP

## 2022-04-18 NOTE — Telephone Encounter (Signed)
Requested medication (s) are due for refill today: no  Requested medication (s) are on the active medication list: yes  Last refill:  04/19/21 #90/4  Future visit scheduled: yes  Notes to clinic:  Unable to refill per protocol due to failed labs, no updated results.      Requested Prescriptions  Pending Prescriptions Disp Refills   meloxicam (MOBIC) 7.5 MG tablet [Pharmacy Med Name: MELOXICAM 7.5 MG TABLET] 90 tablet 4    Sig: TAKE 1 TABLET BY MOUTH EVERY DAY     Analgesics:  COX2 Inhibitors Failed - 04/16/2022 10:28 AM      Failed - Manual Review: Labs are only required if the patient has taken medication for more than 8 weeks.      Failed - HGB in normal range and within 360 days    Hemoglobin  Date Value Ref Range Status  04/19/2021 14.2 11.1 - 15.9 g/dL Final         Failed - HCT in normal range and within 360 days    Hematocrit  Date Value Ref Range Status  04/19/2021 43.3 34.0 - 46.6 % Final         Passed - Cr in normal range and within 360 days    Creatinine, Ser  Date Value Ref Range Status  01/14/2022 0.68 0.57 - 1.00 mg/dL Final         Passed - AST in normal range and within 360 days    AST  Date Value Ref Range Status  01/14/2022 15 0 - 40 IU/L Final         Passed - ALT in normal range and within 360 days    ALT  Date Value Ref Range Status  01/14/2022 22 0 - 32 IU/L Final         Passed - eGFR is 30 or above and within 360 days    GFR calc Af Amer  Date Value Ref Range Status  04/01/2020 95 >59 mL/min/1.73 Final    Comment:    **In accordance with recommendations from the NKF-ASN Task force,**   [facility] is in the process of updating its eGFR calculation to the   2021 CKD-EPI creatinine equation that estimates kidney function   without a race variable.    GFR calc non Af Amer  Date Value Ref Range Status  04/01/2020 82 >59 mL/min/1.73 Final   eGFR  Date Value Ref Range Status  01/14/2022 97 >59 mL/min/1.73 Final         Passed -  Patient is not pregnant      Passed - Valid encounter within last 12 months    Recent Outpatient Visits           2 months ago Type 2 diabetes mellitus with obesity ([place])   Col[place] Huntland, Celeryville T, NP   3 months ago Type 2 diabetes mellitus with obesity ([place])   Tharptown Quitaque, Unity T, NP   6 months ago Type 2 diabetes mellitus with obesity ([place])   Pharr North Ogden, Yabucoa T, NP   9 months ago Type 2 diabetes mellitus with obesity ([place])   [place] Rock Las Ochenta, Paoli T, NP   12 months ago Type 2 diabetes mellitus with obesity ([place])   [place] Park, Henrine Screws T, NP              Signed Prescriptions Disp Refills   metFORMIN (GLUCOPHAGE)  500 MG tablet 360 tablet 0    Sig: TAKE 2 TABLETS (1,000 MG TOTAL) BY MOUTH 2 (TWO) TIMES DAILY WITH A MEAL.     Endocrinology:  Diabetes - Biguanides Failed - 04/16/2022 10:28 AM      Failed - HBA1C is between 0 and 7.9 and within 180 days    Hemoglobin A1C  Date Value Ref Range Status  10/27/2015 7.3%  Final   HB A1C (BAYER DCA - WAIVED)  Date Value Ref Range Status  01/14/2022 8.8 (H) 4.8 - 5.6 % Final    Comment:             Prediabetes: 5.7 - 6.4          Diabetes: >6.4          Glycemic control for adults with diabetes: <7.0          Failed - B12 Level in normal range and within 720 days    Vitamin B-12  Date Value Ref Range Status  06/03/2019 471 232 - 1,245 pg/mL Final         Failed - CBC within normal limits and completed in the last 12 months    WBC  Date Value Ref Range Status  04/19/2021 6.9 3.4 - 10.8 x10E3/uL Final  01/12/2019 14.5 (H) 4.0 - 10.5 K/uL Final   RBC  Date Value Ref Range Status  04/19/2021 5.47 (H) 3.77 - 5.28 x10E6/uL Final  01/12/2019 5.30 (H) 3.87 - 5.11 MIL/uL Final   Hemoglobin  Date Value Ref Range Status  04/19/2021 14.2 11.1 - 15.9 g/dL Final    Hematocrit  Date Value Ref Range Status  04/19/2021 43.3 34.0 - 46.6 % Final   MCHC  Date Value Ref Range Status  04/19/2021 32.8 31.5 - 35.7 g/dL Final  01/12/2019 32.0 30.0 - 36.0 g/dL Final   [facility]  Date Value Ref Range Status  04/19/2021 26.0 (L) 26.6 - 33.0 pg Final  01/12/2019 26.2 26.0 - 34.0 pg Final   MCV  Date Value Ref Range Status  04/19/2021 79 79 - 97 fL Final   No results found for: "PLTCOUNTKUC", "LABPLAT", "POCPLA" RDW  Date Value Ref Range Status  04/19/2021 14.6 11.7 - 15.4 % Final         Passed - Cr in normal range and within 360 days    Creatinine, Ser  Date Value Ref Range Status  01/14/2022 0.68 0.57 - 1.00 mg/dL Final         Passed - eGFR in normal range and within 360 days    GFR calc Af Amer  Date Value Ref Range Status  04/01/2020 95 >59 mL/min/1.73 Final    Comment:    **In accordance with recommendations from the NKF-ASN Task force,**   [facility] is in the process of updating its eGFR calculation to the   2021 CKD-EPI creatinine equation that estimates kidney function   without a race variable.    GFR calc non Af Amer  Date Value Ref Range Status  04/01/2020 82 >59 mL/min/1.73 Final   eGFR  Date Value Ref Range Status  01/14/2022 97 >59 mL/min/1.73 Final         Passed - Valid encounter within last 6 months    Recent Outpatient Visits           2 months ago Type 2 diabetes mellitus with obesity ([place])   [place] Hermosa, Nappanee T, NP   3 months ago Type 2 diabetes mellitus  with obesity ([place])   Kim Fruit Hill, Medicine Lake T, NP   6 months ago Type 2 diabetes mellitus with obesity ([place])   [place] [name], Aguas Buenas T, NP   9 months ago Type 2 diabetes mellitus with obesity ([place])   Roger Mills Lakeville, Wilmington T, NP   12 months ago Type 2 diabetes mellitus with obesity ([place])   [place] Fontana Dam,  [name] T, NP               telmisartan (MICARDIS) 80 MG tablet 90 tablet 0    Sig: TAKE 1 TABLET BY MOUTH EVERY DAY     Cardiovascular:  Angiotensin Receptor Blockers Passed - 04/16/2022 10:28 AM      Passed - Cr in normal range and within 180 days    Creatinine, Ser  Date Value Ref Range Status  01/14/2022 0.68 0.57 - 1.00 mg/dL Final         Passed - K in normal range and within 180 days    Potassium  Date Value Ref Range Status  01/14/2022 4.3 3.5 - 5.2 mmol/L Final         Passed - Patient is not pregnant      Passed - Last BP in normal range    BP Readings from Last 1 Encounters:  02/14/22 134/78         Passed - Valid encounter within last 6 months    Recent Outpatient Visits           2 months ago Type 2 diabetes mellitus with obesity ([place])   Glen Rose Old Harbor, Melbourne Beach T, NP   3 months ago Type 2 diabetes mellitus with obesity ([place])   Grant Vandling, Brewster Hill T, NP   6 months ago Type 2 diabetes mellitus with obesity ([place])   Chester Calhoun, Arkadelphia T, NP   9 months ago Type 2 diabetes mellitus with obesity ([place])   Aloha River Bend, Dutton T, NP   12 months ago Type 2 diabetes mellitus with obesity ([place])   Hazlehurst Westmont, Volo T, NP               metoprolol succinate (TOPROL-XL) 100 MG 24 hr tablet 90 tablet 0    Sig: TAKE 1 TABLET BY MOUTH EVERY DAY WITH OR IMMEDIATELY FOLLOWING A MEAL     Cardiovascular:  Beta Blockers Passed - 04/16/2022 10:28 AM      Passed - Last BP in normal range    BP Readings from Last 1 Encounters:  02/14/22 134/78         Passed - Last Heart Rate in normal range    Pulse Readings from Last 1 Encounters:  02/14/22 62         Passed - Valid encounter within last 6 months    Recent Outpatient Visits           2 months ago Type 2 diabetes mellitus with obesity ([place])   Kimberly [employer], Lake Winnebago T, NP   3 months ago Type 2 diabetes mellitus with obesity ([place])   Crest Calumet City, Water Valley T, NP   6 months ago Type 2 diabetes mellitus with obesity ([place])   Kershaw Holstein, Freeport T, NP   9 months ago Type 2 diabetes mellitus  with obesity ([place])   Weirton Cold Bay, Seeley T, NP   12 months ago Type 2 diabetes mellitus with obesity ([place])   C[place] Troy, Jonesboro T, NP              Refused Prescriptions Disp Refills   atorvastatin (LIPITOR) 40 MG tablet [Pharmacy Med Name: ATORVASTATIN 40 MG TABLET] 90 tablet 3    Sig: TAKE 1 TABLET BY MOUTH EVERY DAY     Cardiovascular:  Antilipid - Statins Failed - 04/16/2022 10:28 AM      Failed - Lipid Panel in normal range within the last 12 months    Cholesterol, Total  Date Value Ref Range Status  01/14/2022 124 100 - 199 mg/dL Final   Cholesterol Piccolo, Waived  Date Value Ref Range Status  02/15/2019 130 <200 mg/dL Final    Comment:                            Desirable                <200                         Borderline High      200- 239                         High                     >239    LDL Chol Calc (NIH)  Date Value Ref Range Status  01/14/2022 44 0 - 99 mg/dL Final   HDL  Date Value Ref Range Status  01/14/2022 50 >39 mg/dL Final   Triglycerides  Date Value Ref Range Status  01/14/2022 184 (H) 0 - 149 mg/dL Final   Triglycerides Piccolo,Waived  Date Value Ref Range Status  02/15/2019 261 (H) <150 mg/dL Final    Comment:                            Normal                   <150                         Borderline High     150 - 199                         High                200 - 499                         Very High                >499          Passed - Patient is not pregnant      Passed - Valid encounter within last 12 months    Recent  Outpatient Visits           2 months ago Type 2 diabetes mellitus with obesity ([place])   Gray Dunnigan, Osage T, NP   3 months ago Type 2 diabetes mellitus with obesity ([place])   [facility] Cannady, [name] T, NP   6 months ago Type 2 diabetes mellitus with obesity ([place])   D'Lo Headland, Oriskany Falls T, NP   9 months ago Type 2 diabetes mellitus with obesity ([place])   Lostine West Lawn, Denver City T, NP   12 months ago Type 2 diabetes mellitus with obesity ([place])   Ringwood Apison, Dwight Mission T, NP               omeprazole (PRILOSEC) 20 MG capsule [Pharmacy Med Name: OMEPRAZOLE DR 20 MG CAPSULE] 90 capsule 4    Sig: TAKE [place]     Gastroenterology: Proton Pump Inhibitors Passed - 04/16/2022 10:28 AM      Passed - Valid encounter within last 12 months    Recent Outpatient Visits           2 months ago Type 2 diabetes mellitus with obesity ([place])   Dimm[place] West Havre, Andalusia T, NP   3 months ago Type 2 diabetes mellitus with obesity ([place])   Fairview Empire, Riviera Beach T, NP   6 months ago Type 2 diabetes mellitus with obesity ([place])   Pearl East Petersburg, Colma T, NP   9 months ago Type 2 diabetes mellitus with obesity ([place])   D[place] Waynetown, Bangor T, NP   12 months ago Type 2 diabetes mellitus with obesity ([place])   [place] Gayle Mill, [name], NP

## 2022-04-19 ENCOUNTER — Encounter: Payer: Medicare Other | Admitting: Nurse Practitioner

## 2022-04-19 DIAGNOSIS — Z794 Long term (current) use of insulin: Secondary | ICD-10-CM

## 2022-04-19 DIAGNOSIS — E559 Vitamin D deficiency, unspecified: Secondary | ICD-10-CM

## 2022-04-19 DIAGNOSIS — K209 Esophagitis, unspecified without bleeding: Secondary | ICD-10-CM

## 2022-04-19 DIAGNOSIS — R748 Abnormal levels of other serum enzymes: Secondary | ICD-10-CM

## 2022-04-19 DIAGNOSIS — E1159 Type 2 diabetes mellitus with other circulatory complications: Secondary | ICD-10-CM

## 2022-04-19 DIAGNOSIS — Z0000 Encounter for general adult medical examination without abnormal findings: Secondary | ICD-10-CM

## 2022-04-19 DIAGNOSIS — F5104 Psychophysiologic insomnia: Secondary | ICD-10-CM

## 2022-04-19 DIAGNOSIS — E1169 Type 2 diabetes mellitus with other specified complication: Secondary | ICD-10-CM

## 2022-04-19 DIAGNOSIS — E1142 Type 2 diabetes mellitus with diabetic polyneuropathy: Secondary | ICD-10-CM

## 2022-04-23 ENCOUNTER — Other Ambulatory Visit: Payer: Self-pay | Admitting: Nurse Practitioner

## 2022-04-25 NOTE — Telephone Encounter (Signed)
Requested Prescriptions  Pending Prescriptions Disp Refills   atorvastatin (LIPITOR) 40 MG tablet [Pharmacy Med Name: ATORVASTATIN 40 MG TABLET] 90 tablet 1    Sig: TAKE 1 TABLET BY MOUTH EVERY DAY     Cardiovascular:  Antilipid - Statins Failed - 04/23/2022  1:06 PM      Failed - Lipid Panel in normal range within the last 12 months    Cholesterol, Total  Date Value Ref Range Status  01/14/2022 124 100 - 199 mg/dL Final   Cholesterol Piccolo, Waived  Date Value Ref Range Status  02/15/2019 130 <200 mg/dL Final    Comment:                            Desirable                <200                         Borderline High      200- 239                         High                     >239    LDL Chol Calc (NIH)  Date Value Ref Range Status  01/14/2022 44 0 - 99 mg/dL Final   HDL  Date Value Ref Range Status  01/14/2022 50 >39 mg/dL Final   Triglycerides  Date Value Ref Range Status  01/14/2022 184 (H) 0 - 149 mg/dL Final   Triglycerides Piccolo,Waived  Date Value Ref Range Status  02/15/2019 261 (H) <150 mg/dL Final    Comment:                            Normal                   <150                         Borderline High     150 - 199                         High                200 - 499                         Very High                >499          Passed - Patient is not pregnant      Passed - Valid encounter within last 12 months    Recent Outpatient Visits           2 months ago Type 2 diabetes mellitus with obesity ([place])   [place] Hill Mendenhall, Kaskaskia T, NP   3 months ago Type 2 diabetes mellitus with obesity ([place])   At[place] Walnut Grove, Tellico Plains T, NP   6 months ago Type 2 diabetes mellitus with obesity ([place])   Crandon Lakes Lincoln Beach, Gladwin T, NP   9 months ago Type 2 diabetes mellitus with obesity ([place])   [place]  Marnee Guarneri T, NP   1 year ago Type 2  diabetes mellitus with obesity [facility])   [place], [name], NP

## 2022-04-26 ENCOUNTER — Telehealth: Payer: PPO

## 2022-05-11 ENCOUNTER — Encounter: Payer: Medicare Other | Admitting: Nurse Practitioner

## 2022-05-11 DIAGNOSIS — Z0000 Encounter for general adult medical examination without abnormal findings: Secondary | ICD-10-CM

## 2022-05-11 DIAGNOSIS — Z794 Long term (current) use of insulin: Secondary | ICD-10-CM

## 2022-05-11 DIAGNOSIS — I152 Hypertension secondary to endocrine disorders: Secondary | ICD-10-CM

## 2022-05-11 DIAGNOSIS — E1142 Type 2 diabetes mellitus with diabetic polyneuropathy: Secondary | ICD-10-CM

## 2022-05-11 DIAGNOSIS — E1169 Type 2 diabetes mellitus with other specified complication: Secondary | ICD-10-CM

## 2022-05-11 DIAGNOSIS — E559 Vitamin D deficiency, unspecified: Secondary | ICD-10-CM

## 2022-05-11 DIAGNOSIS — F5104 Psychophysiologic insomnia: Secondary | ICD-10-CM

## 2022-05-11 DIAGNOSIS — K209 Esophagitis, unspecified without bleeding: Secondary | ICD-10-CM

## 2022-05-18 ENCOUNTER — Other Ambulatory Visit: Payer: Self-pay | Admitting: Nurse Practitioner

## 2022-05-19 NOTE — Telephone Encounter (Signed)
Requested medication (s) are due for refill today - expired Rx  Requested medication (s) are on the active medication list -yes  Future visit scheduled -no  Last refill: 04/16/21 #21 8RF  Notes to clinic: non delegated Rx  Requested Prescriptions  Pending Prescriptions Disp Refills   ondansetron (ZOFRAN) 4 MG tablet [Pharmacy Med Name: ONDANSETRON HCL 4 MG TABLET] 21 tablet 8    Sig: TAKE 1 TABLET BY MOUTH EVERY 8 HOURS AS NEEDED FOR NAUSEA AND VOMITING     Not Delegated - Gastroenterology: Antiemetics - ondansetron Failed - 05/18/2022 12:55 PM      Failed - This refill cannot be delegated      Passed - AST in normal range and within 360 days    AST  Date Value Ref Range Status  01/14/2022 15 0 - 40 IU/L Final         Passed - ALT in normal range and within 360 days    ALT  Date Value Ref Range Status  01/14/2022 22 0 - 32 IU/L Final         Passed - Valid encounter within last 6 months    Recent Outpatient Visits           3 months ago Type 2 diabetes mellitus with obesity ([place])   [place], [place] T, NP   4 months ago Type 2 diabetes mellitus with obesity ([place])   [place], [place] T, NP   7 months ago Type 2 diabetes mellitus with obesity ([place])   [place], [place] T, NP   10 months ago Type 2 diabetes mellitus with obesity ([place])   [place], [place] T, NP   1 year ago Type 2 diabetes mellitus with obesity ([place])   [place], Henrine Screws T, NP                 Requested Prescriptions  Pending Prescriptions Disp Refills   ondansetron (ZOFRAN) 4 MG tablet [Pharmacy Med Name: ONDANSETRON HCL 4 MG TABLET] 21 tablet 8    Sig: TAKE 1 TABLET BY MOUTH EVERY 8 HOURS AS NEEDED FOR NAUSEA AND VOMITING     Not Delegated - Gastroenterology: Antiemetics - ondansetron Failed - 05/18/2022 12:55 PM      Failed  - This refill cannot be delegated      Passed - AST in normal range and within 360 days    AST  Date Value Ref Range Status  01/14/2022 15 0 - 40 IU/L Final         Passed - ALT in normal range and within 360 days    ALT  Date Value Ref Range Status  01/14/2022 22 0 - 32 IU/L Final         Passed - Valid encounter within last 6 months    Recent Outpatient Visits           3 months ago Type 2 diabetes mellitus with obesity ([place])   [place], [place] T, NP   4 months ago Type 2 diabetes mellitus with obesity ([place])   [place], [place] T, NP   7 months ago Type 2 diabetes mellitus with obesity ([place])   [place], [place] T, NP   10 months ago Type 2 diabetes mellitus with obesity ([place])   [place]  [facility] Marnee Guarneri T, NP   1 year ago Type 2 diabetes mellitus with obesity [facility])   [place], [name], NP

## 2022-05-21 ENCOUNTER — Other Ambulatory Visit: Payer: Self-pay | Admitting: Nurse Practitioner

## 2022-05-23 MED ORDER — BD PEN NEEDLE SHORT U/F 31G X 8 MM MISC: 6 refills | Status: DC

## 2022-05-23 NOTE — Addendum Note (Signed)
Addended by: Erie Noe on: 05/23/2022 04:47 PM   Modules accepted: Orders

## 2022-05-23 NOTE — Telephone Encounter (Signed)
Requested Prescriptions  Pending Prescriptions Disp Refills   B-D ULTRAFINE III SHORT PEN 31G X 8 MM MISC [Pharmacy Med Name: BD UF SHORT PEN NEEDLE 8MMX31G] 100 each 6    Sig: USE AS DIRECTED     Endocrinology: Diabetes - Testing Supplies Passed - 05/21/2022  4:37 PM      Passed - Valid encounter within last 12 months    Recent Outpatient Visits           3 months ago Type 2 diabetes mellitus with obesity ([place])   [place], [place] T, NP   4 months ago Type 2 diabetes mellitus with obesity ([place])   [place], [place] T, NP   7 months ago Type 2 diabetes mellitus with obesity ([place])   [place], [place] T, NP   10 months ago Type 2 diabetes mellitus with obesity ([place])   [place], Henrine Screws T, NP   1 year ago Type 2 diabetes mellitus with obesity ([place])   [place], [name], NP

## 2022-05-30 ENCOUNTER — Telehealth: Payer: Medicare Other

## 2022-05-30 ENCOUNTER — Ambulatory Visit (INDEPENDENT_AMBULATORY_CARE_PROVIDER_SITE_OTHER): Payer: Medicare Other

## 2022-05-30 DIAGNOSIS — E1169 Type 2 diabetes mellitus with other specified complication: Secondary | ICD-10-CM

## 2022-05-30 DIAGNOSIS — I152 Hypertension secondary to endocrine disorders: Secondary | ICD-10-CM

## 2022-05-30 NOTE — Chronic Care Management (AMB) (Signed)
Chronic Care Management   CCM RN Visit Note  05/30/2022 Name: April Johnson MRN: [id number] DOB: 07-25-1955  Subjective: April Johnson is a 67 y.o. year old female who is a primary care patient of Cannady, April Faster, NP. The patient was referred to the Chronic Care Management team for assistance with care management needs subsequent to provider initiation of CCM services and plan of care.    Today's Visit:  Engaged with patient by telephone for follow up visit.        Goals Addressed             This Visit's Progress    CCM Expected Outcome:  Monitor, Self-Manage and Reduce Symptoms of Diabetes       Current Barriers:  Care Coordination needs related to cost constraints related to medications she is prescribed for management of blood sugars and keeping A1C in goal in a patient with DM Chronic Disease Management support and education needs related to effective management of DM Financial Constraints.  Difficulty obtaining medications  Lab Results  Component Value Date   HGBA1C 8.8 (H) 01/14/2022   The patient states when she is on Ozempic her A1C is down to 5.7  Planned Interventions: Provided education to patient about basic DM disease process. The patient is concerned because she still has not gotten her Ozempic. The patient states she would really like to be back on the Ozempic because she is gaining weight and her blood sugars are elevated. Pharm D consult for help with getting Ozempic; Reviewed medications with patient and discussed importance of medication adherence. The patient was doing well on Ozempic but cannot afford it with the health plan she has now.  Will collaborate with the pharm D. Have set up an appointment for pharm D to reach out to the patient for assistance to see if she qualifies for assistance with Ozempic. She currently is still taking Januvia, Metformin and Lantus. Will send an in basket message to the pharm D and pcp and let the pcp and pharm D  know. Has upcoming appointments with both the pcp and pharm d;        Reviewed prescribed diet with patient heart healthy/ADA diet. States she has cut out pastas and breads. She is mindful of her dietary intake. Education and support given. She has been having elevated blood sugars.  Counseled on importance of regular laboratory monitoring as prescribed. Has regular lab work done;        Discussed plans with patient for ongoing care management follow up and provided patient with direct contact information for care management team;      Provided patient with written educational materials related to hypo and hyperglycemia and importance of correct treatment; Denies any lows at this time. Has been having blood sugars in the 250's but not over 250    Reviewed scheduled/upcoming provider appointments including: 06-08-2022 at 120 pm     Advised patient, providing education and rationale, to check cbg when you have symptoms of low or high blood sugar and as directed   and record. Her blood sugars have been the highest at 250 and is down to 183.  Education on the goal of fasting <130 and post prandial of <180.  The patient wants to get her blood sugars down. The patient wants to talk to pharm D for assistance with getting back on her Ozempic    call provider for findings outside established parameters;       Referral made to  pharmacy team for assistance with cost constraints related to [place]. Has an appointment coming up with the pharm D on 06-06-2022 at 2 pm       Review of patient status, including review of consultants reports, relevant laboratory and other test results, and medications completed;       Advised patient to discuss if there are samples available in the office at her upcoming office visit, questions and concerns about her DM with provider. Wants to talk to the pcp about a continuous glucose meter;      Screening for signs and symptoms of depression related to chronic disease state;        Assessed  social determinant of health barriers;      Supplied education on the [place] program at [email or web address] for help with grief support after the loss of her husband, [name] last year in June. The patient is thankful she has a good support system from her family and friends. Reflective listening and support given.     Symptom Management: Take medications as prescribed   Attend all scheduled provider appointments Call provider office for new concerns or questions  call the Suicide and Crisis Lifeline: 988 call the [place] National Suicide Prevention Lifeline: 215-107-4708 or TTY: 772 867 0933 TTY [phone]) to talk to a trained counselor call 1-800-273-TALK (toll free, 24 hour hotline) if experiencing a Mental Health or [place]  schedule appointment with eye doctor check feet daily for cuts, sores or redness trim toenails straight across manage portion size wash and dry feet carefully every day wear comfortable, cotton socks wear comfortable, well-fitting shoes  Follow Up Plan: Telephone follow up appointment with care management team member scheduled for: 08-15-2022 at 1145 am       CCM Expected Outcome:  Monitor, Self-Manage and Reduce Symptoms of: HLD       Current Barriers:  Chronic Disease Management support and education needs related to effective management of HLD Lab Results  Component Value Date   CHOL 124 01/14/2022   HDL 50 01/14/2022   LDLCALC 44 01/14/2022   TRIG 184 (H) 01/14/2022   CHOLHDL 3.2 01/24/2018     Planned Interventions: Provider established cholesterol goals reviewed. Review and education provided; Counseled on importance of regular laboratory monitoring as prescribed. Has regular lab work; Provided HLD Scientist, clinical (histocompatibility and immunogenetics); Reviewed role and benefits of statin for ASCVD risk reduction; Discussed strategies to manage statin-induced myalgias. Takes Lipitor 40 mg QD; Reviewed importance of limiting foods high in cholesterol. The patient  is monitoring her dietary intake and trying to eat healthy. Has eliminated certain foods in her diet. The patient denies issues with dietary restrictions. Education and support given; Reviewed exercise goals and target of 150 minutes per week; Screening for signs and symptoms of depression related to chronic disease state;  Assessed social determinant of health barriers;   Symptom Management: Take medications as prescribed   Attend all scheduled provider appointments Call provider office for new concerns or questions  call the Suicide and Crisis Lifeline: 988 call the [place] National Suicide Prevention Lifeline: 316-499-0758 or TTY: (979)100-0313 TTY 818-625-0224) to talk to a trained counselor call 1-800-273-TALK (toll free, 24 hour hotline) if experiencing a Mental Health or [place]  - call for medicine refill 2 or 3 days before it runs out - take all medications exactly as prescribed - call doctor with any symptoms you believe are related to your medicine - call doctor when you experience any new symptoms - go to all doctor appointments  as scheduled  Follow Up Plan: Telephone follow up appointment with care management team member scheduled for: 08-15-2022 at 1145 am       CCM Expected Outcome:  Monitor, Self-Manage, and Reduce Symptoms of Hypertension       Current Barriers:  Chronic Disease Management support and education needs related to effective management of HTN BP Readings from Last 3 Encounters:  02/14/22 134/78  01/14/22 136/70  10/14/21 128/86     Planned Interventions: Evaluation of current treatment plan related to hypertension self management and patient's adherence to plan as established by provider. Saw the patient on 02-14-2022. The patient has good control of HTN at this time. Denies any changes in her HTN. The patient has upcoming visit with pcp for AWV.    Provided education to patient re: stroke prevention, s/s of heart attack and  stroke; Reviewed prescribed diet heart healthy/ADA diet. Review of heart healthy/ADA diet. The patient is eating and has actually gained weight. She wants to get back on her Ozempic but is having issues with cost constraints. Education and support give. Pharm D referral for help with medications cost.  Reviewed medications with patient and discussed importance of compliance. The patient is compliant with medications. Is compliant with medications;  Discussed plans with patient for ongoing care management follow up and provided patient with direct contact information for care management team; Advised patient, providing education and rationale, to monitor blood pressure daily and record, calling PCP for findings outside established parameters;  Reviewed scheduled/upcoming provider appointments including: 06-08-2022 at 120 pm Advised patient to discuss changes in blood pressures or heart health with provider; Provided education on prescribed diet heart healthy/ADA diet. Review and education provided ;  Discussed complications of poorly controlled blood pressure such as heart disease, stroke, circulatory complications, vision complications, kidney impairment, sexual dysfunction;  Screening for signs and symptoms of depression related to chronic disease state;  Assessed social determinant of health barriers;   Symptom Management: Take medications as prescribed   Attend all scheduled provider appointments Call provider office for new concerns or questions  call the Suicide and Crisis Lifeline: 988 call the [place] National Suicide Prevention Lifeline: (940)726-8248 or TTY: 540-606-7688 TTY 416 575 7573) to talk to a trained counselor call 1-800-273-TALK (toll free, 24 hour hotline) if experiencing a Mental Health or [place]  check blood pressure weekly learn about high blood pressure keep a blood pressure log take blood pressure log to all doctor appointments call doctor for signs and  symptoms of high blood pressure develop an action plan for high blood pressure keep all doctor appointments take medications for blood pressure exactly as prescribed report new symptoms to your doctor  Follow Up Plan: Telephone follow up appointment with care management team member scheduled for: 08-15-2022 at 1145 am          Plan:Telephone follow up appointment with care management team member scheduled for:  08-15-2022 at 1145 am  Noreene Larsson RN, MSN, CCM RN Care Manager  Chronic Care Management Direct Number: [phone]

## 2022-05-30 NOTE — Patient Instructions (Signed)
Please call the care guide team at [phone] if you need to cancel or reschedule your appointment.   If you are experiencing a Mental Health or [place] or need someone to talk to, please call the Suicide and Crisis Lifeline: 988 call the [place] National Suicide Prevention Lifeline: (478)667-3261 or TTY: 574-147-0878 TTY 989 376 9654) to talk to a trained counselor call 1-800-273-TALK (toll free, 24 hour hotline)   Following is a copy of the CCM Program Consent:  CCM service includes personalized support from designated clinical staff supervised by the physician, including individualized plan of care and coordination with other care providers 24/7 contact phone numbers for assistance for urgent and routine care needs. Service will only be billed when office clinical staff spend 20 minutes or more in a month to coordinate care. Only one practitioner may furnish and bill the service in a calendar month. The patient may stop CCM services at amy time (effective at the end of the month) by phone call to the office staff. The patient will be responsible for cost sharing (co-pay) or up to 20% of the service fee (after annual deductible is met)  Following is a copy of your full provider care plan:   Goals Addressed             This Visit's Progress    CCM Expected Outcome:  Monitor, Self-Manage and Reduce Symptoms of Diabetes       Current Barriers:  Care Coordination needs related to cost constraints related to medications she is prescribed for management of blood sugars and keeping A1C in goal in a patient with DM Chronic Disease Management support and education needs related to effective management of DM Financial Constraints.  Difficulty obtaining medications  Lab Results  Component Value Date   HGBA1C 8.8 (H) 01/14/2022   The patient states when she is on Ozempic her A1C is down to 5.7  Planned Interventions: Provided education to patient about basic DM disease  process. The patient is concerned because she still has not gotten her Ozempic. The patient states she would really like to be back on the Ozempic because she is gaining weight and her blood sugars are elevated. Pharm D consult for help with getting Ozempic; Reviewed medications with patient and discussed importance of medication adherence. The patient was doing well on Ozempic but cannot afford it with the health plan she has now.  Will collaborate with the pharm D. Have set up an appointment for pharm D to reach out to the patient for assistance to see if she qualifies for assistance with Ozempic. She currently is still taking Januvia, Metformin and Lantus. Will send an in basket message to the pharm D and pcp and let the pcp and pharm D know. Has upcoming appointments with both the pcp and pharm d;        Reviewed prescribed diet with patient heart healthy/ADA diet. States she has cut out pastas and breads. She is mindful of her dietary intake. Education and support given. She has been having elevated blood sugars.  Counseled on importance of regular laboratory monitoring as prescribed. Has regular lab work done;        Discussed plans with patient for ongoing care management follow up and provided patient with direct contact information for care management team;      Provided patient with written educational materials related to hypo and hyperglycemia and importance of correct treatment; Denies any lows at this time. Has been having blood sugars in the 250's  but not over 250    Reviewed scheduled/upcoming provider appointments including: 06-08-2022 at 120 pm     Advised patient, providing education and rationale, to check cbg when you have symptoms of low or high blood sugar and as directed   and record. Her blood sugars have been the highest at 250 and is down to 183.  Education on the goal of fasting <130 and post prandial of <180.  The patient wants to get her blood sugars down. The patient wants to talk  to pharm D for assistance with getting back on her Ozempic    call provider for findings outside established parameters;       Referral made to pharmacy team for assistance with cost constraints related to [place]. Has an appointment coming up with the pharm D on 06-06-2022 at 2 pm       Review of patient status, including review of consultants reports, relevant laboratory and other test results, and medications completed;       Advised patient to discuss if there are samples available in the office at her upcoming office visit, questions and concerns about her DM with provider. Wants to talk to the pcp about a continuous glucose meter;      Screening for signs and symptoms of depression related to chronic disease state;        Assessed social determinant of health barriers;      Supplied education on the [place] program at [email or web address] for help with grief support after the loss of her husband, [name] last year in June. The patient is thankful she has a good support system from her family and friends. Reflective listening and support given.     Symptom Management: Take medications as prescribed   Attend all scheduled provider appointments Call provider office for new concerns or questions  call the Suicide and Crisis Lifeline: 988 call the [place] National Suicide Prevention Lifeline: 904-009-4130 or TTY: (443)071-6973 TTY [phone]) to talk to a trained counselor call 1-800-273-TALK (toll free, 24 hour hotline) if experiencing a Mental Health or [place]  schedule appointment with eye doctor check feet daily for cuts, sores or redness trim toenails straight across manage portion size wash and dry feet carefully every day wear comfortable, cotton socks wear comfortable, well-fitting shoes  Follow Up Plan: Telephone follow up appointment with care management team member scheduled for: 08-15-2022 at 1145 am       CCM Expected Outcome:  Monitor, Self-Manage and Reduce  Symptoms of: HLD       Current Barriers:  Chronic Disease Management support and education needs related to effective management of HLD Lab Results  Component Value Date   CHOL 124 01/14/2022   HDL 50 01/14/2022   LDLCALC 44 01/14/2022   TRIG 184 (H) 01/14/2022   CHOLHDL 3.2 01/24/2018     Planned Interventions: Provider established cholesterol goals reviewed. Review and education provided; Counseled on importance of regular laboratory monitoring as prescribed. Has regular lab work; Provided HLD Scientist, clinical (histocompatibility and immunogenetics); Reviewed role and benefits of statin for ASCVD risk reduction; Discussed strategies to manage statin-induced myalgias. Takes Lipitor 40 mg QD; Reviewed importance of limiting foods high in cholesterol. The patient is monitoring her dietary intake and trying to eat healthy. Has eliminated certain foods in her diet. The patient denies issues with dietary restrictions. Education and support given; Reviewed exercise goals and target of 150 minutes per week; Screening for signs and symptoms of depression related to chronic disease state;  Assessed social determinant  of health barriers;   Symptom Management: Take medications as prescribed   Attend all scheduled provider appointments Call provider office for new concerns or questions  call the Suicide and Crisis Lifeline: 988 call the [place] National Suicide Prevention Lifeline: 616-464-8582 or TTY: (670)558-6319 TTY [phone]) to talk to a trained counselor call 1-800-273-TALK (toll free, 24 hour hotline) if experiencing a Mental Health or [place]  - call for medicine refill 2 or 3 days before it runs out - take all medications exactly as prescribed - call doctor with any symptoms you believe are related to your medicine - call doctor when you experience any new symptoms - go to all doctor appointments as scheduled  Follow Up Plan: Telephone follow up appointment with care management team member  scheduled for: 08-15-2022 at 1145 am       CCM Expected Outcome:  Monitor, Self-Manage, and Reduce Symptoms of Hypertension       Current Barriers:  Chronic Disease Management support and education needs related to effective management of HTN BP Readings from Last 3 Encounters:  02/14/22 134/78  01/14/22 136/70  10/14/21 128/86     Planned Interventions: Evaluation of current treatment plan related to hypertension self management and patient's adherence to plan as established by provider. Saw the patient on 02-14-2022. The patient has good control of HTN at this time. Denies any changes in her HTN. The patient has upcoming visit with pcp for AWV.    Provided education to patient re: stroke prevention, s/s of heart attack and stroke; Reviewed prescribed diet heart healthy/ADA diet. Review of heart healthy/ADA diet. The patient is eating and has actually gained weight. She wants to get back on her Ozempic but is having issues with cost constraints. Education and support give. Pharm D referral for help with medications cost.  Reviewed medications with patient and discussed importance of compliance. The patient is compliant with medications. Is compliant with medications;  Discussed plans with patient for ongoing care management follow up and provided patient with direct contact information for care management team; Advised patient, providing education and rationale, to monitor blood pressure daily and record, calling PCP for findings outside established parameters;  Reviewed scheduled/upcoming provider appointments including: 06-08-2022 at 120 pm Advised patient to discuss changes in blood pressures or heart health with provider; Provided education on prescribed diet heart healthy/ADA diet. Review and education provided ;  Discussed complications of poorly controlled blood pressure such as heart disease, stroke, circulatory complications, vision complications, kidney impairment, sexual dysfunction;   Screening for signs and symptoms of depression related to chronic disease state;  Assessed social determinant of health barriers;   Symptom Management: Take medications as prescribed   Attend all scheduled provider appointments Call provider office for new concerns or questions  call the Suicide and Crisis Lifeline: 988 call the [place] National Suicide Prevention Lifeline: 506-717-0975 or TTY: (225) 469-0151 TTY 816 123 7078) to talk to a trained counselor call 1-800-273-TALK (toll free, 24 hour hotline) if experiencing a Mental Health or [place]  check blood pressure weekly learn about high blood pressure keep a blood pressure log take blood pressure log to all doctor appointments call doctor for signs and symptoms of high blood pressure develop an action plan for high blood pressure keep all doctor appointments take medications for blood pressure exactly as prescribed report new symptoms to your doctor  Follow Up Plan: Telephone follow up appointment with care management team member scheduled for: 08-15-2022 at 1145 am  Patient verbalizes understanding of instructions and care plan provided today and agrees to view in [place]. Active MyChart status and patient understanding of how to access instructions and care plan via MyChart confirmed with patient.     Telephone follow up appointment with care management team member scheduled for: 08-15-2022 at 1145 am

## 2022-06-02 ENCOUNTER — Other Ambulatory Visit: Payer: Self-pay | Admitting: Nurse Practitioner

## 2022-06-03 ENCOUNTER — Ambulatory Visit: Payer: Self-pay | Admitting: *Deleted

## 2022-06-03 NOTE — Telephone Encounter (Signed)
Requested Prescriptions  Refused Prescriptions Disp Refills   omeprazole (PRILOSEC) 20 MG capsule [Pharmacy Med Name: OMEPRAZOLE DR 20 MG CAPSULE] 90 capsule 4    Sig: TAKE 1 CAPSULE BY MOUTH EVERY DAY     Gastroenterology: Proton Pump Inhibitors Passed - 06/02/2022  2:10 PM      Passed - Valid encounter within last 12 months    Recent Outpatient Visits           3 months ago Type 2 diabetes mellitus with obesity ([place])   [place], Dearborn T, NP   4 months ago Type 2 diabetes mellitus with obesity ([place])   [place], Millville T, NP   7 months ago Type 2 diabetes mellitus with obesity ([place])   [place], Sky Valley T, NP   10 months ago Type 2 diabetes mellitus with obesity ([place])   [place], Sherwood T, NP   1 year ago Type 2 diabetes mellitus with obesity ([place])   [place], [name] T, NP               telmisartan (MICARDIS) 80 MG tablet [Pharmacy Med Name: TELMISARTAN 80 MG TABLET] 90 tablet 0    Sig: TAKE 1 TABLET BY MOUTH EVERY DAY     Cardiovascular:  Angiotensin Receptor Blockers Passed - 06/02/2022  2:10 PM      Passed - Cr in normal range and within 180 days    Creatinine, Ser  Date Value Ref Range Status  01/14/2022 0.68 0.57 - 1.00 mg/dL Final         Passed - K in normal range and within 180 days    Potassium  Date Value Ref Range Status  01/14/2022 4.3 3.5 - 5.2 mmol/L Final         Passed - Patient is not pregnant      Passed - Last BP in normal range    BP Readings from Last 1 Encounters:  02/14/22 134/78         Passed - Valid encounter within last 6 months    Recent Outpatient Visits           3 months ago Type 2 diabetes mellitus with obesity ([place])   [place], Hudson T, NP   4 months ago Type 2 diabetes mellitus with obesity ([place])   [place], Hartwick T, NP   7 months ago Type 2 diabetes mellitus with obesity ([place])   [place], Clifton T, NP   10 months ago Type 2 diabetes mellitus with obesity ([place])   [place] Badger, McKenney T, NP   1 year ago Type 2 diabetes mellitus with obesity ([place])   [place], Timber Hills T, NP               metFORMIN (GLUCOPHAGE) 500 MG tablet [Pharmacy Med Name: METFORMIN HCL 500 MG TABLET] 360 tablet 0    Sig: TAKE 2 TABLETS (1,000 MG TOTAL) BY MOUTH 2 (TWO) TIMES DAILY WITH A MEAL.     Endocrinology:  Diabetes - Biguanides Failed - 06/02/2022  2:10 PM      Failed - HBA1C is between 0 and 7.9 and within 180 days    Hemoglobin A1C  Date Value Ref Range Status  10/27/2015 7.3%  Final  HB A1C (BAYER DCA - WAIVED)  Date Value Ref Range Status  01/14/2022 8.8 (H) 4.8 - 5.6 % Final    Comment:             Prediabetes: 5.7 - 6.4          Diabetes: >6.4          Glycemic control for adults with diabetes: <7.0          Failed - B12 Level in normal range and within 720 days    Vitamin B-12  Date Value Ref Range Status  06/03/2019 471 232 - 1,245 pg/mL Final         Failed - CBC within normal limits and completed in the last 12 months    WBC  Date Value Ref Range Status  04/19/2021 6.9 3.4 - 10.8 x10E3/uL Final  01/12/2019 14.5 (H) 4.0 - 10.5 K/uL Final   RBC  Date Value Ref Range Status  04/19/2021 5.47 (H) 3.77 - 5.28 x10E6/uL Final  01/12/2019 5.30 (H) 3.87 - 5.11 MIL/uL Final   Hemoglobin  Date Value Ref Range Status  04/19/2021 14.2 11.1 - 15.9 g/dL Final   Hematocrit  Date Value Ref Range Status  04/19/2021 43.3 34.0 - 46.6 % Final   MCHC  Date Value Ref Range Status  04/19/2021 32.8 31.5 - 35.7 g/dL Final  01/12/2019 32.0 30.0 - 36.0 g/dL Final   [facility]  Date Value Ref Range Status  04/19/2021 26.0 (L) 26.6 - 33.0 pg Final  01/12/2019 26.2 26.0 -  34.0 pg Final   MCV  Date Value Ref Range Status  04/19/2021 79 79 - 97 fL Final   No results found for: "PLTCOUNTKUC", "LABPLAT", "POCPLA" RDW  Date Value Ref Range Status  04/19/2021 14.6 11.7 - 15.4 % Final         Passed - Cr in normal range and within 360 days    Creatinine, Ser  Date Value Ref Range Status  01/14/2022 0.68 0.57 - 1.00 mg/dL Final         Passed - eGFR in normal range and within 360 days    GFR calc Af Amer  Date Value Ref Range Status  04/01/2020 95 >59 mL/min/1.73 Final    Comment:    **In accordance with recommendations from the NKF-ASN Task force,**   [facility] is in the process of updating its eGFR calculation to the   2021 CKD-EPI creatinine equation that estimates kidney function   without a race variable.    GFR calc non Af Amer  Date Value Ref Range Status  04/01/2020 82 >59 mL/min/1.73 Final   eGFR  Date Value Ref Range Status  01/14/2022 97 >59 mL/min/1.73 Final         Passed - Valid encounter within last 6 months    Recent Outpatient Visits           3 months ago Type 2 diabetes mellitus with obesity ([place])   [place], Poca T, NP   4 months ago Type 2 diabetes mellitus with obesity ([place])   [place]us, Clam Lake T, NP   7 months ago Type 2 diabetes mellitus with obesity ([place])   [place], Meadow Acres T, NP   10 months ago Type 2 diabetes mellitus with obesity ([place])   [place], Harris T, NP   1 year ago Type 2 diabetes mellitus with obesity ([place])  [place], East Palestine T, NP               gabapentin (NEURONTIN) 300 MG capsule [Pharmacy Med Name: GABAPENTIN 300 MG CAPSULE] 180 capsule 4    Sig: TAKE 1 CAPSULE BY MOUTH TWICE A DAY     Neurology: Anticonvulsants - gabapentin Passed - 06/02/2022  2:10 PM      Passed - Cr in normal range and within 360 days     Creatinine, Ser  Date Value Ref Range Status  01/14/2022 0.68 0.57 - 1.00 mg/dL Final         Passed - Completed PHQ-2 or PHQ-9 in the last 360 days      Passed - Valid encounter within last 12 months    Recent Outpatient Visits           3 months ago Type 2 diabetes mellitus with obesity ([place])   [place], Woxall T, NP   4 months ago Type 2 diabetes mellitus with obesity ([place])   [place], Sargent T, NP   7 months ago Type 2 diabetes mellitus with obesity ([place])   [place], Chaffee T, NP   10 months ago Type 2 diabetes mellitus with obesity ([place])   [place], Woodland T, NP   1 year ago Type 2 diabetes mellitus with obesity ([place])   [place], Pippa Passes T, NP               metoprolol succinate (TOPROL-XL) 100 MG 24 hr tablet [Pharmacy Med Name: METOPROLOL SUCC ER 100 MG TAB] 90 tablet 0    Sig: TAKE 1 TABLET BY MOUTH EVERY DAY WITH OR IMMEDIATELY FOLLOWING A MEAL     Cardiovascular:  Beta Blockers Passed - 06/02/2022  2:10 PM      Passed - Last BP in normal range    BP Readings from Last 1 Encounters:  02/14/22 134/78         Passed - Last Heart Rate in normal range    Pulse Readings from Last 1 Encounters:  02/14/22 62         Passed - Valid encounter within last 6 months    Recent Outpatient Visits           3 months ago Type 2 diabetes mellitus with obesity ([place])   [place], Vesper T, NP   4 months ago Type 2 diabetes mellitus with obesity ([place])   [place], Altus T, NP   7 months ago Type 2 diabetes mellitus with obesity ([place])   [place], Hendley T, NP   10 months ago Type 2 diabetes mellitus with obesity ([place])   [place], Bryant T, NP   1 year ago Type  2 diabetes mellitus with obesity ([place])   [place]hent, Allerton T, NP               traZODone (DESYREL) 50 MG tablet [Pharmacy Med Name: TRAZODONE 50 MG TABLET] 90 tablet 1    Sig: TAKE 1/2 TO 1 TABLET BY MOUTH AT BEDTIME AS NEEDED FOR SLEEP     Psychiatry: Antidepressants - Serotonin Modulator Passed - 06/02/2022  2:10 PM      Passed - Valid encounter within last 6 months  Recent Outpatient Visits           3 months ago Type 2 diabetes mellitus with obesity ([place])   [place], Brookhurst T, NP   4 months ago Type 2 diabetes mellitus with obesity ([place])   [place], Big Bend T, NP   7 months ago Type 2 diabetes mellitus with obesity ([place])   [place], Arlington T, NP   10 months ago Type 2 diabetes mellitus with obesity ([place])   [place] [employer], Henrine Screws T, NP   1 year ago Type 2 diabetes mellitus with obesity ([place])   [place] Cannady, [name] T, NP               FARXIGA 5 MG TABS tablet [Pharmacy Med Name: FARXIGA 5 MG TABLET] 30 tablet 14    Sig: TAKE 1 TABLET (5 MG TOTAL) BY MOUTH DAILY.     Endocrinology:  Diabetes - SGLT2 Inhibitors Failed - 06/02/2022  2:10 PM      Failed - HBA1C is between 0 and 7.9 and within 180 days    Hemoglobin A1C  Date Value Ref Range Status  10/27/2015 7.3%  Final   HB A1C (BAYER DCA - WAIVED)  Date Value Ref Range Status  01/14/2022 8.8 (H) 4.8 - 5.6 % Final    Comment:             Prediabetes: 5.7 - 6.4          Diabetes: >6.4          Glycemic control for adults with diabetes: <7.0          Passed - Cr in normal range and within 360 days    Creatinine, Ser  Date Value Ref Range Status  01/14/2022 0.68 0.57 - 1.00 mg/dL Final         Passed - eGFR in normal range and within 360 days    GFR calc Af Amer  Date Value Ref Range Status  04/01/2020 95  >59 mL/min/1.73 Final    Comment:    **In accordance with recommendations from the NKF-ASN Task force,**   [facility] is in the process of updating its eGFR calculation to the   2021 CKD-EPI creatinine equation that estimates kidney function   without a race variable.    GFR calc non Af Amer  Date Value Ref Range Status  04/01/2020 82 >59 mL/min/1.73 Final   eGFR  Date Value Ref Range Status  01/14/2022 97 >59 mL/min/1.73 Final         Passed - Valid encounter within last 6 months    Recent Outpatient Visits           3 months ago Type 2 diabetes mellitus with obesity ([place])   [place], Alpine T, NP   4 months ago Type 2 diabetes mellitus with obesity ([place])   [place], Beaux Arts Village T, NP   7 months ago Type 2 diabetes mellitus with obesity ([place])   [place], Burgettstown T, NP   10 months ago Type 2 diabetes mellitus with obesity ([place])   [place], Henrine Screws T, NP   1 year ago Type 2 diabetes mellitus with obesity ([place])   [place], [name], NP

## 2022-06-03 NOTE — Telephone Encounter (Signed)
Patient made aware of Provider's recommendations and verbalized understanding.   

## 2022-06-03 NOTE — Telephone Encounter (Signed)
Reason for Disposition . [1] Caller has URGENT medicine question about med that PCP or specialist prescribed AND [2] triager unable to answer question  Answer Assessment - Initial Assessment Questions 1. NAME of MEDICINE: "What medicine(s) are you calling about?"     Ozempic  2. QUESTION: "What is your question?" (e.g., double dose of medicine, side effect)     Wanting to make sure she can the Ozempic with Januvia or did she need to go back to farxiga  3. PRESCRIBER: "Who prescribed the medicine?" Reason: if prescribed by specialist, call should be referred to that group.     Henrine Screws, NP  Protocols used: Medication Question Call-A-AH

## 2022-06-03 NOTE — Telephone Encounter (Signed)
Summary: Pt requests call back to discuss her medications and how they possibly interfere with each other   Pt requests call back to discuss her medications. Pt stated she was told that the Ozempic could interfere with one of her other medications so she would like to discuss. Cb# [phone]     Attempted to call patient- message: "Call can not completed as dialed-please try again"  x 2

## 2022-06-03 NOTE — Telephone Encounter (Signed)
  Chief Complaint: medication problem  Symptoms: NA Frequency: today Pertinent Negatives: NA Disposition: [] ED /[] Urgent Care (no appt availability in office) / [] Appointment(In office/virtual)/ []  [facility]/ [] Home Care/ [] Refused Recommended Disposition /[] [facility]/ [x]  Follow-up with PCP Additional Notes: pt states that her Ozempic is ready for PU and she wanted to make sure she can take it with still taking the Januvia or did she need to switch back to the [place]. Pt is gong to pharmacy this afternoon and just wanting to know if ok to continue meds or not. Advised pt I would send message back to provider for review and get CMA to FU with her. Pt verbalized understanding.

## 2022-06-03 NOTE — Telephone Encounter (Signed)
Summary: Pt requests call back to discuss her medications and how they possibly interfere with each other   Pt requests call back to discuss her medications. Pt stated she was told that the Ozempic could interfere with one of her other medications so she would like to discuss. Cb# [phone]          Call cannot be completed as dialed.

## 2022-06-03 NOTE — Telephone Encounter (Signed)
Summary: Pt requests call back to discuss her medications and how they possibly interfere with each other   Pt requests call back to discuss her medications. Pt stated she was told that the Ozempic could interfere with one of her other medications so she would like to discuss. Cb# [phone]         Call cannot be completed as dialed. Forwarding encounter to office for follow up.

## 2022-06-05 DIAGNOSIS — I10 Essential (primary) hypertension: Secondary | ICD-10-CM

## 2022-06-05 DIAGNOSIS — Z794 Long term (current) use of insulin: Secondary | ICD-10-CM

## 2022-06-05 DIAGNOSIS — E1159 Type 2 diabetes mellitus with other circulatory complications: Secondary | ICD-10-CM | POA: Diagnosis not present

## 2022-06-05 DIAGNOSIS — E785 Hyperlipidemia, unspecified: Secondary | ICD-10-CM

## 2022-06-06 ENCOUNTER — Other Ambulatory Visit: Payer: Self-pay

## 2022-06-06 ENCOUNTER — Other Ambulatory Visit: Payer: Medicare Other

## 2022-06-06 ENCOUNTER — Other Ambulatory Visit: Payer: Self-pay | Admitting: Nurse Practitioner

## 2022-06-06 MED ORDER — LEVEMIR FLEXTOUCH 100 UNIT/ML ~~LOC~~ SOPN: PEN_INJECTOR | SUBCUTANEOUS | 4 refills | Status: DC

## 2022-06-06 NOTE — Telephone Encounter (Signed)
Medication refill for Levemir Flexpen last ov 02/14/22, upcoming ov 06/08/22 . Please advise

## 2022-06-07 NOTE — Telephone Encounter (Signed)
Requested medication (s) are due for refill today: na  Requested medication (s) are on the active medication list: yes   Last refill:  06/06/22 #60ml 4 refills  Future visit scheduled: no   Notes to clinic:  Pharmacy comment: Product Backordered/Unavailable:LEVEMIR IS BEING DISCONTINUED BY MANUFACTURER IN 2024. PLEASE CONSIDER THE POTENTIAL ALTERNATIVE(S) LISTED AND EVALUATE IF APPROPRIATE FOR YOUR PATIENT'S TREATMENT GOALS  Please advise       Requested Prescriptions  Pending Prescriptions Disp Refills   LANTUS 100 UNIT/ML injection [Pharmacy Med Name: LANTUS 100 UNIT/ML VIAL] 10 mL 4    Sig: Please specify directions, refills and quantity     Endocrinology:  Diabetes - Insulins Failed - 06/06/2022  5:31 PM      Failed - HBA1C is between 0 and 7.9 and within 180 days    Hemoglobin A1C  Date Value Ref Range Status  10/27/2015 7.3%  Final   HB A1C (BAYER DCA - WAIVED)  Date Value Ref Range Status  01/14/2022 8.8 (H) 4.8 - 5.6 % Final    Comment:             Prediabetes: 5.7 - 6.4          Diabetes: >6.4          Glycemic control for adults with diabetes: <7.0          Passed - Valid encounter within last 6 months    Recent Outpatient Visits           3 months ago Type 2 diabetes mellitus with obesity ([place])   [place], Chamisal T, NP   4 months ago Type 2 diabetes mellitus with obesity ([place])   [place], [place] T, NP   7 months ago Type 2 diabetes mellitus with obesity ([place])   [place], [place] T, NP   10 months ago Type 2 diabetes mellitus with obesity ([place])   [place], Henrine Screws T, NP   1 year ago Type 2 diabetes mellitus with obesity ([place])   [place], [name], NP

## 2022-06-08 ENCOUNTER — Encounter: Payer: Medicare Other | Admitting: Nurse Practitioner

## 2022-06-08 DIAGNOSIS — F5104 Psychophysiologic insomnia: Secondary | ICD-10-CM

## 2022-06-08 DIAGNOSIS — E559 Vitamin D deficiency, unspecified: Secondary | ICD-10-CM

## 2022-06-08 DIAGNOSIS — Z794 Long term (current) use of insulin: Secondary | ICD-10-CM

## 2022-06-08 DIAGNOSIS — Z0000 Encounter for general adult medical examination without abnormal findings: Secondary | ICD-10-CM

## 2022-06-08 DIAGNOSIS — K209 Esophagitis, unspecified without bleeding: Secondary | ICD-10-CM

## 2022-06-08 DIAGNOSIS — E1142 Type 2 diabetes mellitus with diabetic polyneuropathy: Secondary | ICD-10-CM

## 2022-06-08 DIAGNOSIS — I152 Hypertension secondary to endocrine disorders: Secondary | ICD-10-CM

## 2022-06-08 DIAGNOSIS — J301 Allergic rhinitis due to pollen: Secondary | ICD-10-CM

## 2022-06-08 DIAGNOSIS — E1169 Type 2 diabetes mellitus with other specified complication: Secondary | ICD-10-CM

## 2022-06-09 MED ORDER — INSULIN PEN NEEDLE 32G X 4 MM MISC: 2 refills | Status: DC

## 2022-06-09 NOTE — Progress Notes (Signed)
    [name], female   DOB: 1955-08-25, 67 y.o.   MRN: [id number]  Subjective/Objective:   Telephone visit in regard to medication affordability, specific to diabetes medications.  DM and Medication Management -Ozempic is $47 each month with patient's insurance, and Januvia was going to be $47 each month, also -Patient also states prescribed pen needles would have been >$50 -She has been able to get Ozempic for now and knows not to take Januvia along with this medication -Also endorses having pen needles at this time -Discussed upcoming need to change Levemir to different long-acting insulin due to manufacturer no longer making- she currently has 3 pens of Levemir at home and some Lantus, but is unsure of the expiration date on Lantus  Assessment/Plan:  DM and Medication Management -Patient would qualify for PAP through [place]; and according to chart, application was submitted.  Contacted [employer], and was given the following information: application was  missing proof of income, copy of insurance card front/back, and patient signature on Medicare part D page (page 2).  Will notify patient at follow-up visit tomorrow - Likely that patient would also qualify for PAP for Jardiance- will consult PCP to see if she would like to purse adding on - Will pend prescription for pen needles that should be more affordable to be placed on hold for when patient needs refill - Patient will let me know at appointment Friday if Lantus at home is usable  Darlina Guys, PharmD, DPLA

## 2022-06-10 ENCOUNTER — Other Ambulatory Visit (HOSPITAL_COMMUNITY): Payer: Self-pay

## 2022-06-10 ENCOUNTER — Other Ambulatory Visit: Payer: Medicare Other

## 2022-06-10 ENCOUNTER — Telehealth: Payer: Medicare Other

## 2022-06-10 NOTE — Progress Notes (Signed)
   06/10/2022  [name], female   DOB: [birth year], 67 y.o.   MRN: [id number]  Subjective/Objective: Telephone follow up regarding medication management specific to control of diabetes. -Lantus patient had at home was expired; [name] sent new prescription in for Levemir, and pharmacy was able to fill -Informed April Johnson of missing components on Ozempic PAP application -She has 3 more doses of Ozempic on hand currently; took her first dose Monday and is tolerating well -PCP agrees with initiation of Jardiance; discussed with patient, and she is also in agreement -Patient reports most recent BG reading was 143 yesterday afternoon  Assessment/Plan: -Will work with medication assistant team to see if we are able to just send page 2 of patient portion of [employer] PAP application for patient to sign; she will mail this back along with copy of front/back of insurance card and proof of income -Also working with medication assistance team to initiate patient assistance for [employer] -Educated patient to use what she has on hand of Januvia and then start Jardiance -Once pharmacy is unable to fill Levemir due to discontinuation of product, we will need to replace with Lantus prescription- can be dosed equivalently  -Recommend to check blood glucose daily and record, so we can gauge efficacy of regimen and make adjustments as needed with goal to get patient off of insulin  Follow-Up:  Telephone visit in 2 weeks to check on home BG readings and make insulin adjustments if needed  Lenna Gilford, PharmD, DPLA

## 2022-06-10 NOTE — Telephone Encounter (Signed)
Submitted online application to [employer] for [employer]. Will mail pt portion of [employer] app following business week.

## 2022-06-10 NOTE — Telephone Encounter (Signed)
-----   Message from Lenna Gilford, [facility] sent at 06/10/2022 12:04 PM EDT ----- Happy Friday!  Can we initiate the application process for Jardiance PAP please?  And an odd request:  I contacted [employer] to f/u on her Ozempic PAP, and they are missing proof of income, front/back copy of insurance card, and patient signature on page 2 of the application.  Is there a way we could just send her page 2 to sign and send back in with the other missing documentation?  Thank you!

## 2022-06-14 NOTE — Telephone Encounter (Signed)
[employer]ICARES app mailed out. Patient has been enrolled in [employer] patient assistance until 06/09/2023 for Ozempic. Medication will be mailed to provider's office in 10 to 14 business days

## 2022-06-16 ENCOUNTER — Other Ambulatory Visit: Payer: Self-pay | Admitting: Nurse Practitioner

## 2022-06-16 NOTE — Telephone Encounter (Signed)
Requested Prescriptions  Refused Prescriptions Disp Refills   gabapentin (NEURONTIN) 300 MG capsule [Pharmacy Med Name: GABAPENTIN 300 MG CAPSULE] 180 capsule 4    Sig: TAKE 1 CAPSULE BY MOUTH TWICE A DAY     Neurology: Anticonvulsants - gabapentin Passed - 06/16/2022 11:00 AM      Passed - Cr in normal range and within 360 days    Creatinine, Ser  Date Value Ref Range Status  01/14/2022 0.68 0.57 - 1.00 mg/dL Final         Passed - Completed PHQ-2 or PHQ-9 in the last 360 days      Passed - Valid encounter within last 12 months    Recent Outpatient Visits           4 months ago Type 2 diabetes mellitus with obesity (HCC)   [place] [facility] [place], [place] T, NP   5 months ago Type 2 diabetes mellitus with obesity (HCC)   [place] [facility] [place], [place] T, NP   8 months ago Type 2 diabetes mellitus with obesity (HCC)   [place] [facility] [place], [place] T, NP   11 months ago Type 2 diabetes mellitus with obesity (HCC)   [place] [facility] [place], Corrie Dandy T, NP   1 year ago Type 2 diabetes mellitus with obesity (HCC)   [place] [facility] [place], Dorie Rank, NP       Future Appointments             In 2 weeks Cannady, Dorie Rank, NP  [place], [facility]

## 2022-06-20 ENCOUNTER — Telehealth: Payer: HMO

## 2022-06-20 ENCOUNTER — Other Ambulatory Visit: Payer: Medicare Other

## 2022-06-23 ENCOUNTER — Other Ambulatory Visit: Payer: Self-pay | Admitting: Nurse Practitioner

## 2022-06-24 ENCOUNTER — Telehealth: Payer: Self-pay

## 2022-06-24 ENCOUNTER — Telehealth: Payer: Self-pay | Admitting: Nurse Practitioner

## 2022-06-24 ENCOUNTER — Other Ambulatory Visit: Payer: Medicare Other

## 2022-06-24 NOTE — Telephone Encounter (Signed)
Requested medication (s) are due for refill today:   Yes and No  Requested medication (s) are on the active medication list:   Yes for all 4  Future visit scheduled:   Yes in 1 wk. With [name]   Last ordered: Metformin 04/18/2022 #360, 0 refills, Omeprazole 07/21/2021 #90, 4 refills;   Gabapentin 04/19/2021 #180, 4 refills;   Trazodone 03/06/2022 #90, 1 refill  Returned because labs due per protocol.      Requested Prescriptions  Pending Prescriptions Disp Refills   metFORMIN (GLUCOPHAGE) 500 MG tablet [Pharmacy Med Name: METFORMIN HCL 500 MG TABLET] 360 tablet 0    Sig: TAKE 2 TABLETS (1,000 MG TOTAL) BY MOUTH 2 (TWO) TIMES DAILY WITH A MEAL.     Endocrinology:  Diabetes - Biguanides Failed - 06/23/2022 10:48 AM      Failed - HBA1C is between 0 and 7.9 and within 180 days    Hemoglobin A1C  Date Value Ref Range Status  10/27/2015 7.3%  Final   HB A1C (BAYER DCA - WAIVED)  Date Value Ref Range Status  01/14/2022 8.8 (H) 4.8 - 5.6 % Final    Comment:             Prediabetes: 5.7 - 6.4          Diabetes: >6.4          Glycemic control for adults with diabetes: <7.0          Failed - B12 Level in normal range and within 720 days    Vitamin B-12  Date Value Ref Range Status  06/03/2019 471 232 - 1,245 pg/mL Final         Failed - CBC within normal limits and completed in the last 12 months    WBC  Date Value Ref Range Status  04/19/2021 6.9 3.4 - 10.8 x10E3/uL Final  01/12/2019 14.5 (H) 4.0 - 10.5 K/uL Final   RBC  Date Value Ref Range Status  04/19/2021 5.47 (H) 3.77 - 5.28 x10E6/uL Final  01/12/2019 5.30 (H) 3.87 - 5.11 MIL/uL Final   Hemoglobin  Date Value Ref Range Status  04/19/2021 14.2 11.1 - 15.9 g/dL Final   Hematocrit  Date Value Ref Range Status  04/19/2021 43.3 34.0 - 46.6 % Final   MCHC  Date Value Ref Range Status  04/19/2021 32.8 31.5 - 35.7 g/dL Final  11/91/4782 95.6 30.0 - 36.0 g/dL Final   [facility]  Date Value Ref Range Status  04/19/2021 26.0  (L) 26.6 - 33.0 pg Final  01/12/2019 26.2 26.0 - 34.0 pg Final   MCV  Date Value Ref Range Status  04/19/2021 79 79 - 97 fL Final   No results found for: "PLTCOUNTKUC", "LABPLAT", "POCPLA" RDW  Date Value Ref Range Status  04/19/2021 14.6 11.7 - 15.4 % Final         Passed - Cr in normal range and within 360 days    Creatinine, Ser  Date Value Ref Range Status  01/14/2022 0.68 0.57 - 1.00 mg/dL Final         Passed - eGFR in normal range and within 360 days    GFR calc Af Amer  Date Value Ref Range Status  04/01/2020 95 >59 mL/min/1.73 Final    Comment:    **In accordance with recommendations from the NKF-ASN Task force,**   [facility] is in the process of updating its eGFR calculation to the   2021 CKD-EPI creatinine equation that estimates kidney function   without  a race variable.    GFR calc non Af Amer  Date Value Ref Range Status  04/01/2020 82 >59 mL/min/1.73 Final   eGFR  Date Value Ref Range Status  01/14/2022 97 >59 mL/min/1.73 Final         Passed - Valid encounter within last 6 months    Recent Outpatient Visits           4 months ago Type 2 diabetes mellitus with obesity (HCC)   [place] [facility] [place], Miguel Barrera T, NP   5 months ago Type 2 diabetes mellitus with obesity (HCC)   [place] [facility] [place], [place] T, NP   8 months ago Type 2 diabetes mellitus with obesity (HCC)   [place] [facility] [place], [place] T, NP   11 months ago Type 2 diabetes mellitus with obesity (HCC)   [facility] [place], Corrie Dandy T, NP   1 year ago Type 2 diabetes mellitus with obesity (HCC)   [place] [facility] [place], Dorie Rank, NP       Future Appointments             In 1 week Cannady, Dorie Rank, NP [place] [facility], [facility]             omeprazole (PRILOSEC) 20 MG capsule [Pharmacy Med Name: OMEPRAZOLE DR 20 MG CAPSULE] 90 capsule 4    Sig: TAKE 1  CAPSULE BY MOUTH EVERY DAY     Gastroenterology: Proton Pump Inhibitors Passed - 06/23/2022 10:48 AM      Passed - Valid encounter within last 12 months    Recent Outpatient Visits           4 months ago Type 2 diabetes mellitus with obesity (HCC)   [place] [facility] [place], [place] T, NP   5 months ago Type 2 diabetes mellitus with obesity (HCC)   [place] [facility] [place], [place] T, NP   8 months ago Type 2 diabetes mellitus with obesity (HCC)   [place] [facility] [place], [place] T, NP   11 months ago Type 2 diabetes mellitus with obesity (HCC)   [place] [facility] [place], Corrie Dandy T, NP   1 year ago Type 2 diabetes mellitus with obesity (HCC)   [place] [facility] [place], [place] T, NP       Future Appointments             In 1 week Cannady, Corrie Dandy T, NP  [facility], [facility]             gabapentin (NEURONTIN) 300 MG capsule [Pharmacy Med Name: GABAPENTIN 300 MG CAPSULE] 180 capsule 4    Sig: TAKE 1 CAPSULE BY MOUTH TWICE A DAY     Neurology: Anticonvulsants - gabapentin Passed - 06/23/2022 10:48 AM      Passed - Cr in normal range and within 360 days    Creatinine, Ser  Date Value Ref Range Status  01/14/2022 0.68 0.57 - 1.00 mg/dL Final         Passed - Completed PHQ-2 or PHQ-9 in the last 360 days      Passed - Valid encounter within last 12 months    Recent Outpatient Visits           4 months ago Type 2 diabetes mellitus with obesity (HCC)    [facility] [place], Dorie Rank, NP  5 months ago Type 2 diabetes mellitus with obesity (HCC)   [place] [facility] [place], Clifford T, NP   8 months ago Type 2 diabetes mellitus with obesity (HCC)   [facility] [place], [place] T, NP   11 months ago Type 2 diabetes mellitus with obesity (HCC)   [place] [facility] [place], Corrie Dandy  T, NP   1 year ago Type 2 diabetes mellitus with obesity (HCC)   [place] [facility] [place], Dorie Rank, NP       Future Appointments             In 1 week Cannady, Dorie Rank, NP [place] [facility], [facility]             traZODone (DESYREL) 50 MG tablet [Pharmacy Med Name: TRAZODONE 50 MG TABLET] 90 tablet 1    Sig: TAKE 1/2 TO 1 TABLET BY MOUTH AT BEDTIME AS NEEDED FOR SLEEP     Psychiatry: Antidepressants - Serotonin Modulator Passed - 06/23/2022 10:48 AM      Passed - Valid encounter within last 6 months    Recent Outpatient Visits           4 months ago Type 2 diabetes mellitus with obesity (HCC)   [place] [facility] [place], [place] T, NP   5 months ago Type 2 diabetes mellitus with obesity (HCC)   [place] [place], [place] T, NP   8 months ago Type 2 diabetes mellitus with obesity (HCC)   [place] [facility] [place], [place] T, NP   11 months ago Type 2 diabetes mellitus with obesity (HCC)   [place] [facility] [place], Corrie Dandy T, NP   1 year ago Type 2 diabetes mellitus with obesity (HCC)   [place] [facility] [place], Dorie Rank, NP       Future Appointments             In 1 week Cannady, Dorie Rank, NP [place] [facility], [facility]

## 2022-06-24 NOTE — Telephone Encounter (Signed)
Pt came by and picked up the 4 boxes of Ozempic.

## 2022-06-24 NOTE — Telephone Encounter (Signed)
4 boxes of Ozempic received for the patient. Called and let her know that it was ready to be picked up.

## 2022-06-24 NOTE — Telephone Encounter (Unsigned)
Copied from CRM [id number]. Topic: General - Other >> Jun 24, 2022  9:22 AM April Johnson wrote: Reason for CRM: The patient has called to follow up on their previous discussions regarding being changed from sitaGLIPtin (JANUVIA) 100 MG tablet [[id number]] to Jardiance   The patient would like to speak with a member of clinical staff to see what which medication will be prescribed and submitted to their pharmacy   Please contact the patient further when possible

## 2022-06-27 ENCOUNTER — Ambulatory Visit
Admission: RE | Admit: 2022-06-27 | Discharge: 2022-06-27 | Disposition: A | Discharge status: Home / Self Care | Payer: Medicare Other | Source: Ambulatory Visit | Attending: Nurse Practitioner | Admitting: Nurse Practitioner

## 2022-06-27 DIAGNOSIS — Z780 Asymptomatic menopausal state: Secondary | ICD-10-CM | POA: Diagnosis not present

## 2022-06-27 DIAGNOSIS — M85852 Other specified disorders of bone density and structure, left thigh: Secondary | ICD-10-CM | POA: Diagnosis not present

## 2022-06-27 NOTE — Progress Notes (Signed)
   06/27/2022  [name], female   DOB: [birth year], 67 y.o.   MRN: [id number]  Subjective/Objective: Telephone follow-up to check on PAP progress and home BG  DM -Tolerating Ozempic  well; PAP approved through April 2025 -Patient received Jardiance PAP application and will submit ASAP -Almost out of Januvia ; continues to take Levemir 60 units hs, metformin 1,000mg  BID -FBG today 147  Assessment/Plan:  DM -PCP office does not have any medication samples -Recommended refilling 1 month of Januvia to take until Jardiance PAP approved and arrives; Jardiance will then replace Januvia -Continue to check FBG daily and record -If patient continues to tolerate Ozempic  well at next appt, will recommend sending  prescription to [employer] PAP  Follow-Up:  Telephone visit 5/17 to check on Jardiance PAP progress and home BG  Lenna Gilford, PharmD, DPLA

## 2022-06-27 NOTE — Progress Notes (Signed)
Contacted via MyChart   Your bone density shows thinning bones (osteopenia) but not brittle (osteoporosis). We recommend Vitamin D supplementation of about 2,0000 IUs of over the counter Vitamin D3. In addition, we recommend a diet high in calcium with dairy and dark green leafy vegetables. We would like you to get plenty of weight bearing exercises with walking and resistance training such as light weights or resistance bands available with instructions at places such as [employer].

## 2022-06-29 NOTE — Telephone Encounter (Signed)
Received pt portion of application, will fax MD portion to be filled out but need to confirm starting dose of Jardiance

## 2022-06-30 NOTE — Telephone Encounter (Signed)
Thank you! Faxing MD portion of pt assistance application to office, please be on the lookout

## 2022-07-03 DIAGNOSIS — M85852 Other specified disorders of bone density and structure, left thigh: Secondary | ICD-10-CM | POA: Insufficient documentation

## 2022-07-06 ENCOUNTER — Ambulatory Visit (INDEPENDENT_AMBULATORY_CARE_PROVIDER_SITE_OTHER): Payer: Medicare Other | Admitting: Nurse Practitioner

## 2022-07-06 ENCOUNTER — Encounter: Payer: Self-pay | Admitting: Nurse Practitioner

## 2022-07-06 ENCOUNTER — Ambulatory Visit: Payer: Medicare Other | Admitting: Nurse Practitioner

## 2022-07-06 VITALS — BP 128/70 | HR 82 | Temp 98.2°F | Ht 64.02 in | Wt 183.1 lb

## 2022-07-06 DIAGNOSIS — Z794 Long term (current) use of insulin: Secondary | ICD-10-CM | POA: Diagnosis not present

## 2022-07-06 DIAGNOSIS — E559 Vitamin D deficiency, unspecified: Secondary | ICD-10-CM | POA: Diagnosis not present

## 2022-07-06 DIAGNOSIS — M85852 Other specified disorders of bone density and structure, left thigh: Secondary | ICD-10-CM

## 2022-07-06 DIAGNOSIS — E1159 Type 2 diabetes mellitus with other circulatory complications: Secondary | ICD-10-CM

## 2022-07-06 DIAGNOSIS — Z0000 Encounter for general adult medical examination without abnormal findings: Secondary | ICD-10-CM

## 2022-07-06 DIAGNOSIS — I152 Hypertension secondary to endocrine disorders: Secondary | ICD-10-CM

## 2022-07-06 DIAGNOSIS — E1142 Type 2 diabetes mellitus with diabetic polyneuropathy: Secondary | ICD-10-CM

## 2022-07-06 DIAGNOSIS — E1169 Type 2 diabetes mellitus with other specified complication: Secondary | ICD-10-CM

## 2022-07-06 DIAGNOSIS — K209 Esophagitis, unspecified without bleeding: Secondary | ICD-10-CM | POA: Diagnosis not present

## 2022-07-06 DIAGNOSIS — E785 Hyperlipidemia, unspecified: Secondary | ICD-10-CM | POA: Diagnosis not present

## 2022-07-06 DIAGNOSIS — F5104 Psychophysiologic insomnia: Secondary | ICD-10-CM

## 2022-07-06 DIAGNOSIS — E669 Obesity, unspecified: Secondary | ICD-10-CM

## 2022-07-06 LAB — BAYER DCA HB A1C WAIVED: HB A1C (BAYER DCA - WAIVED): 8.6 % — ABNORMAL HIGH (ref 4.8–5.6)

## 2022-07-06 LAB — MICROALBUMIN, URINE WAIVED
Creatinine, Urine Waived: 300 mg/dL (ref 10–300)
Microalb, Ur Waived: 150 mg/L — ABNORMAL HIGH (ref 0–19)

## 2022-07-06 LAB — HM DIABETES EYE EXAM

## 2022-07-06 MED ORDER — GABAPENTIN 300 MG PO CAPS: 300.0000 mg | ORAL_CAPSULE | Freq: Every day | ORAL | 4 refills | Status: DC

## 2022-07-06 MED ORDER — METOPROLOL SUCCINATE ER 100 MG PO TB24: ORAL_TABLET | ORAL | 4 refills | Status: DC

## 2022-07-06 MED ORDER — AMLODIPINE BESYLATE 5 MG PO TABS: 5.0000 mg | ORAL_TABLET | Freq: Every day | ORAL | 4 refills | Status: DC

## 2022-07-06 MED ORDER — ATORVASTATIN CALCIUM 40 MG PO TABS: 40.0000 mg | ORAL_TABLET | Freq: Every day | ORAL | 4 refills | Status: DC

## 2022-07-06 MED ORDER — TELMISARTAN 80 MG PO TABS: 80.0000 mg | ORAL_TABLET | Freq: Every day | ORAL | 4 refills | Status: DC

## 2022-07-06 NOTE — Assessment & Plan Note (Signed)
Chronic, ongoing.  Continue current medication regimen and adjust as needed based on labs.  Lipid panel today. 

## 2022-07-06 NOTE — Patient Instructions (Signed)
Diabetes Mellitus Basics  Diabetes mellitus, or diabetes, is a long-term (chronic) disease. It occurs when the body does not properly use sugar (glucose) that is released from food after you eat. Diabetes mellitus may be caused by one or both of these problems: Your pancreas does not make enough of a hormone called insulin. Your body does not react in a normal way to the insulin that it makes. Insulin lets glucose enter cells in your body. This gives you energy. If you have diabetes, glucose cannot get into cells. This causes high blood glucose (hyperglycemia). How to treat and manage diabetes You may need to take insulin or other diabetes medicines daily to keep your glucose in balance. If you are prescribed insulin, you will learn how to give yourself insulin by injection. You may need to adjust the amount of insulin you take based on the foods that you eat. You will need to check your blood glucose levels using a glucose monitor as told by your health care provider. The readings can help determine if you have low or high blood glucose. Generally, you should have these blood glucose levels: Before meals (preprandial): 80-130 mg/dL (4.4-7.2 mmol/L). After meals (postprandial): below 180 mg/dL (10 mmol/L). Hemoglobin A1c (HbA1c) level: less than 7%. Your health care provider will set treatment goals for you. Keep all follow-up visits. This is important. Follow these instructions at home: Diabetes medicines Take your diabetes medicines every day as told by your health care provider. List your diabetes medicines here: Name of medicine: ______________________________ Amount (dose): _______________ Time (a.m./p.m.): _______________ Notes: ___________________________________ Name of medicine: ______________________________ Amount (dose): _______________ Time (a.m./p.m.): _______________ Notes: ___________________________________ Name of medicine: ______________________________ Amount (dose):  _______________ Time (a.m./p.m.): _______________ Notes: ___________________________________ Insulin If you use insulin, list the types of insulin you use here: Insulin type: ______________________________ Amount (dose): _______________ Time (a.m./p.m.): _______________Notes: ___________________________________ Insulin type: ______________________________ Amount (dose): _______________ Time (a.m./p.m.): _______________ Notes: ___________________________________ Insulin type: ______________________________ Amount (dose): _______________ Time (a.m./p.m.): _______________ Notes: ___________________________________ Insulin type: ______________________________ Amount (dose): _______________ Time (a.m./p.m.): _______________ Notes: ___________________________________ Insulin type: ______________________________ Amount (dose): _______________ Time (a.m./p.m.): _______________ Notes: ___________________________________ Managing blood glucose  Check your blood glucose levels using a glucose monitor as told by your health care provider. Write down the times that you check your glucose levels here: Time: _______________ Notes: ___________________________________ Time: _______________ Notes: ___________________________________ Time: _______________ Notes: ___________________________________ Time: _______________ Notes: ___________________________________ Time: _______________ Notes: ___________________________________ Time: _______________ Notes: ___________________________________  Low blood glucose Low blood glucose (hypoglycemia) is when glucose is at or below 70 mg/dL (3.9 mmol/L). Symptoms may include: Feeling: Hungry. Sweaty and clammy. Irritable or easily upset. Dizzy. Sleepy. Having: A fast heartbeat. A headache. A change in your vision. Numbness around the mouth, lips, or tongue. Having trouble with: Moving (coordination). Sleeping. Treating low blood glucose To treat low blood  glucose, eat or drink something containing sugar right away. If you can think clearly and swallow safely, follow the 15:15 rule: Take 15 grams of a fast-acting carb (carbohydrate), as told by your health care provider. Some fast-acting carbs are: Glucose tablets: take 3-4 tablets. Hard candy: eat 3-5 pieces. Fruit juice: drink 4 oz (120 mL). Regular (not diet) soda: drink 4-6 oz (120-180 mL). Honey or sugar: eat 1 Tbsp (15 mL). Check your blood glucose levels 15 minutes after you take the carb. If your glucose is still at or below 70 mg/dL (3.9 mmol/L), take 15 grams of a carb again. If your glucose does not go above 70 mg/dL (3.9 mmol/L) after   3 tries, get help right away. After your glucose goes back to normal, eat a meal or a snack within 1 hour. Treating very low blood glucose If your glucose is at or below 54 mg/dL (3 mmol/L), you have very low blood glucose (severe hypoglycemia). This is an emergency. Do not wait to see if the symptoms will go away. Get medical help right away. Call your local emergency services (911 in the [place] Do not drive yourself to the hospital. Questions to ask your health care provider Should I talk with a diabetes educator? What equipment will I need to care for myself at home? What diabetes medicines do I need? When should I take them? How often do I need to check my blood glucose levels? What number can I call if I have questions? When is my follow-up visit? Where can I find a support group for people with diabetes? Where to find more information American Diabetes Association: www.diabetes.org Association of Diabetes Care and Education Specialists: www.diabeteseducator.org Contact a health care provider if: Your blood glucose is at or above 240 mg/dL (13.3 mmol/L) for 2 days in a row. You have been sick or have had a fever for 2 days or more, and you are not getting better. You have any of these problems for more than 6 hours: You cannot eat or  drink. You feel nauseous. You vomit. You have diarrhea. Get help right away if: Your blood glucose is lower than 54 mg/dL (3 mmol/L). You get confused. You have trouble thinking clearly. You have trouble breathing. These symptoms may represent a serious problem that is an emergency. Do not wait to see if the symptoms will go away. Get medical help right away. Call your local emergency services (911 in the [place] Do not drive yourself to the hospital. Summary Diabetes mellitus is a chronic disease that occurs when the body does not properly use sugar (glucose) that is released from food after you eat. Take insulin and diabetes medicines as told. Check your blood glucose every day, as often as told. Keep all follow-up visits. This is important. This information is not intended to replace advice given to you by your health care provider. Make sure you discuss any questions you have with your health care provider. Document Revised: 06/25/2019 Document Reviewed: 06/25/2019 Elsevier Patient Education  2023 [employer] Inc.  

## 2022-07-06 NOTE — Assessment & Plan Note (Signed)
Chronic, ongoing with A1c trending down to 8.6%, previous 8.8%, just restarted Ozempic.  She is working with pharmacy team on this -- assistance on [place].  Continue Metformin 1000 MG BID and Levemir 60 units every other night (goal is to discontinue once Ozempic at max and Jardiance back on board) + Ozempic (will send in papers to increase to 2 MG dosing).  Educated her on medications and side effects.  Maintain Gabapentin but reduce to 300 MG at night since also takes Trazodone for sleep.  Return in 3 months. - Eye exam in office today, foot exam done - Statin and ARB on board - Vaccinations up to date

## 2022-07-06 NOTE — Assessment & Plan Note (Signed)
Chronic, stable with BP at goal for age in office today. Continue current medication regimen at this time and adjust as needed.  Telmisartan offering kidney protection.  Recommend she check BP at least 3 times a week at home + focus on DASH diet.  Continue to monitor kidney function and if GFR < 45 consider reduction Metformin.  Urine ALB 150 May 2024.  LABS: CMP, TSH, urine ALB.  Refills sent in.

## 2022-07-06 NOTE — Assessment & Plan Note (Signed)
Chronic, stable with no symptoms with Prilosec on board.  Continue current medication regimen and adjust as needed.  Mag level today.  Risks of PPI use were discussed with patient including bone loss, C. Diff diarrhea, pneumonia, infections, CKD, electrolyte abnormalities.  Verbalizes understanding and chooses to continue the medication.

## 2022-07-06 NOTE — Progress Notes (Signed)
BP 128/70 (BP Location: Left Arm, Patient Position: Sitting, Cuff Size: Large)   Pulse 82   Temp 98.2 F (36.8 C) (Oral)   Ht 5' 4.02" (1.626 m)   Wt 183 lb 1.6 oz (83.1 kg)   LMP  (LMP Unknown)   SpO2 97%   BMI 31.41 kg/m    Subjective:    [name], female    DOB: 07-25-1955, 67 y.o.   MRN: [id number]  HPI: April Johnson is a 67 y.o. female presenting on 07/06/2022 for Welcome To Medicare Wellness and follow-up. Current medical complaints include:none  She currently lives with: self Menopausal Symptoms: no  DIABETES A1c 8.8% in November.  Taking Levemir 60 units every other night since starting on Ozempic again, Metformin 1000 MG BID.  Just restarted Ozempic at 1 MG via assistance program, stared in beginning of April.  Has stopped Januvia as instructed.  Is working on assistance for [employer], awaiting response.    Her sugars have been trending down since restarting Ozempic and appetite has decreased.  Has lost 6 pounds. Takes Gabapentin 600 MG at night for discomfort neuropathy, but reports sometimes wakes up groggy.  Is taking Trazodone at night too. Hypoglycemic episodes:no Polydipsia/polyuria: no Visual disturbance: no Chest pain: no Paresthesias: no Glucose Monitoring: yes             Accucheck frequency: daily             Fasting glucose: 143 this morning, was not fasting             Post prandial:             Evening:              Before meals: Taking Insulin?: yes             Long acting insulin: 60 units every other day             Short acting insulin: Blood Pressure Monitoring: occasionally Retinal Examination: Not up to Date -- needs to schedule Foot Exam: Up to Date Pneumovax: Up to Date Influenza: Up to Date Aspirin: yes    HYPERTENSION / HYPERLIPIDEMIA  Continues on Amlodipine, ASA, Lipitor, Metoprolol, and Micardis.   Satisfied with current treatment? yes Duration of hypertension: chronic BP monitoring frequency: not  checking BP range:  BP medication side effects: no Duration of hyperlipidemia: chronic Cholesterol medication side effects: no Cholesterol supplements: none Medication compliance: good compliance Aspirin: yes Recent stressors: no Recurrent headaches: no Visual changes: no Palpitations: no Dyspnea: no Chest pain: no Lower extremity edema: no Dizzy/lightheaded: no  GERD Continues on Omeprazole daily.  Needs to take as Ozempic at times causes heart burn. GERD control status: stable Satisfied with current treatment? yes Heartburn frequency: none Medication side effects: no  Medication compliance: stable Dysphagia: no Odynophagia:  no Hematemesis: no Blood in stool: no EGD: no   OSTEOPENIA Noted on DEXA scan 06/27/22 osteopenia.  Satisfied with current treatment?: yes Medication compliance: good compliance Adequate calcium & vitamin D: yes Weight bearing exercises: yes      07/06/2022    3:35 PM 02/14/2022   10:58 AM 01/14/2022   10:21 AM 10/14/2021   11:09 AM 07/19/2021   10:37 AM  Depression screen PHQ 2/9  Decreased Interest 0 1 1 1 1   Down, Depressed, Hopeless 0 0 0 0 0  PHQ - 2 Score 0 1 1 1 1   Altered sleeping 0 1 0 1 2  Tired,  decreased energy 0 1 1 1 1   Change in appetite 0 0 1 0 0  Feeling bad or failure about yourself  0 0 0 0 0  Trouble concentrating 0 0 0 0 0  Moving slowly or fidgety/restless 0 0 0 0 0  Suicidal thoughts 0 0 0 0 0  PHQ-9 Score 0 3 3 3 4   Difficult doing work/chores  Not difficult at all  Not difficult at all        07/06/2022    3:36 PM 02/14/2022   10:59 AM 01/14/2022   10:22 AM 10/14/2021   11:09 AM  GAD 7 : Generalized Anxiety Score  Nervous, Anxious, on Edge 0 0 0 0  Control/stop worrying 0 0 0 0  Worry too much - different things 0 0 0 1  Trouble relaxing 0 0 0 1  Restless 0 0 0 0  Easily annoyed or irritable 0 1 1 1   Afraid - awful might happen 0 0 0 0  Total GAD 7 Score 0 1 1 3   Anxiety Difficulty Not difficult at all  Not difficult at all Not difficult at all Not difficult at all      07/19/2021   10:36 AM 01/14/2022   10:21 AM 01/20/2022   12:09 PM 02/14/2022   10:58 AM 05/30/2022   12:18 PM  Fall Risk  Falls in the past year? 0 0 0 0 0  Was there an injury with Fall? 0 0 0 0 0  Fall Risk Category Calculator 0 0 0 0 0  Fall Risk Category (Retired) Low Low Low Low   (RETIRED) Patient Fall Risk Level Low fall risk Low fall risk Low fall risk    Patient at Risk for Falls Due to No Fall Risks No Fall Risks No Fall Risks No Fall Risks   Fall risk Follow up Falls evaluation completed Falls evaluation completed Falls evaluation completed Falls evaluation completed     Functional Status Survey: Is the patient deaf or have difficulty hearing?: No Does the patient have difficulty seeing, even when wearing glasses/contacts?: No Does the patient have difficulty concentrating, remembering, or making decisions?: No Does the patient have difficulty walking or climbing stairs?: No Does the patient have difficulty dressing or bathing?: No Does the patient have difficulty doing errands alone such as visiting a doctor's office or shopping?: No    Past Medical History:  Past Medical History:  Diagnosis Date   Acute bronchitis    Allergy    Arthritis    right ankle   Complication of anesthesia    difficult to wake up   Diabetes mellitus without complication (HCC)    type 2   Diarrhea    Esophagitis    Excessive menstruation    GERD (gastroesophageal reflux disease)    Hyperlipidemia    Hypertension    CONTROLLED ON MEDS   Long term current use of insulin (HCC)    Neuropathy    left leg and feet   Shortness of breath dyspnea    upon exertion   Wears partial dentures    upper / does not wear    Surgical History:  Past Surgical History:  Procedure Laterality Date   CHOLECYSTECTOMY     COLONOSCOPY WITH PROPOFOL N/A 01/12/2015   Procedure: COLONOSCOPY WITH PROPOFOL;  Surgeon: Midge Minium, MD;   Location: [facility];  Service: Endoscopy;  Laterality: N/A;  DIABETIC-INSULIN DEPENDENT   COLONOSCOPY WITH PROPOFOL N/A 04/13/2020   Procedure: COLONOSCOPY WITH BIOPSY;  Surgeon: Midge Minium, MD;  Location: [facility];  Service: Endoscopy;  Laterality: N/A;   OPEN REDUCTION INTERNAL FIXATION (ORIF) DISTAL RADIAL FRACTURE Right 01/17/2019   Procedure: OPEN REDUCTION INTERNAL FIXATION (ORIF) DISTAL RADIAL FRACTURE;  Surgeon: Kennedy Bucker, MD;  Location: [facility];  Service: Orthopedics;  Laterality: Right;   POLYPECTOMY  01/12/2015   Procedure: POLYPECTOMY;  Surgeon: Midge Minium, MD;  Location: [facility];  Service: Endoscopy;;   POLYPECTOMY N/A 04/13/2020   Procedure: POLYPECTOMY;  Surgeon: Midge Minium, MD;  Location: [facility];  Service: Endoscopy;  Laterality: N/A;   WRIST SURGERY Right    ganglion cyst    Medications:  Current Outpatient Medications on File Prior to Visit  Medication Sig   aspirin 81 MG tablet Take 81 mg by mouth daily. am   Blood Glucose Monitoring Suppl (ONETOUCH VERIO) w/Device KIT Use glucometer to check blood sugar up to 3 times a day.   glucose blood test strip Use as instructed   insulin detemir (LEVEMIR FLEXTOUCH) 100 UNIT/ML FlexPen INJECT 60 UNITS INTO THE SKIN AT BEDTIME.   Insulin Pen Needle 32G X 4 MM MISC Use to inject insulin daily   Lancets (ONETOUCH ULTRASOFT) lancets Use as instructed   meloxicam (MOBIC) 7.5 MG tablet TAKE 1 TABLET BY MOUTH EVERY DAY   metFORMIN (GLUCOPHAGE) 500 MG tablet TAKE 2 TABLETS (1,000 MG TOTAL) BY MOUTH 2 (TWO) TIMES DAILY WITH A MEAL.   Multiple Vitamins-Minerals (MULTIVITAMIN ADULT) CHEW Chew by mouth daily.   omeprazole (PRILOSEC) 20 MG capsule TAKE 1 CAPSULE BY MOUTH EVERY DAY   ondansetron (ZOFRAN) 4 MG tablet TAKE 1 TABLET BY MOUTH EVERY 8 HOURS AS NEEDED FOR NAUSEA AND VOMITING   OZEMPIC, 1 MG/DOSE, 4 MG/3ML SOPN Inject 1 mg into the skin once a week.   tiZANidine (ZANAFLEX) 4 MG  tablet TAKE ONE TABLET BY MOUTH EVERY 6 HOURS AS NEEDED   traZODone (DESYREL) 50 MG tablet TAKE 1/2 TO 1 TABLET BY MOUTH AT BEDTIME AS NEEDED FOR SLEEP   No current facility-administered medications on file prior to visit.    Allergies:  Allergies  Allergen Reactions   Niacin And Related Other (See Comments)    Made her feel as if she was "burning"/skin redness   Cyclobenzaprine Rash    Social History:  Social History   Socioeconomic History   Marital status: Widowed    Spouse name: Not on file   Number of children: Not on file   Years of education: Not on file   Highest education level: Not on file  Occupational History   Not on file  Tobacco Use   Smoking status: Former    Packs/day: 0.25    Years: 10.00    Additional pack years: 0.00    Total pack years: 2.50    Types: Cigarettes   Smokeless tobacco: Never  Vaping Use   Vaping Use: Never used  Substance and Sexual Activity   Alcohol use: No    Alcohol/week: 0.0 standard drinks of alcohol   Drug use: No   Sexual activity: Never  Other Topics Concern   Not on file  Social History Narrative   Not on file   Social Determinants of Health   Financial Resource Strain: Medium Risk (07/06/2022)   Overall Financial Resource Strain (CARDIA)    Difficulty of Paying Living Expenses: Somewhat hard  Food Insecurity: No Food Insecurity (07/06/2022)   Hunger Vital Sign    Worried About Running Out of Food in  the Last Year: Never true    Ran Out of Food in the Last Year: Never true  Transportation Needs: No Transportation Needs (07/06/2022)   PRAPARE - Administrator, Civil Service (Medical): No    Lack of Transportation (Non-Medical): No  Physical Activity: Insufficiently Active (07/06/2022)   Exercise Vital Sign    Days of Exercise per Week: 2 days    Minutes of Exercise per Session: 30 min  Stress: No Stress Concern Present (07/06/2022)   [employer] of Occupational Health - Occupational Stress Questionnaire     Feeling of Stress : Not at all  Social Connections: Moderately Isolated (07/06/2022)   Social Connection and Isolation Panel [NHANES]    Frequency of Communication with Friends and Family: Three times a week    Frequency of Social Gatherings with Friends and Family: Three times a week    Attends Religious Services: More than 4 times per year    Active Member of Clubs or Organizations: No    Attends Banker Meetings: Never    Marital Status: Widowed  Intimate Partner Violence: Not At Risk (07/06/2022)   Humiliation, Afraid, Rape, and Kick questionnaire    Fear of Current or Ex-Partner: No    Emotionally Abused: No    Physically Abused: No    Sexually Abused: No   Social History   Tobacco Use  Smoking Status Former   Packs/day: 0.25   Years: 10.00   Additional pack years: 0.00   Total pack years: 2.50   Types: Cigarettes  Smokeless Tobacco Never   Social History   Substance and Sexual Activity  Alcohol Use No   Alcohol/week: 0.0 standard drinks of alcohol    Family History:  Family History  Problem Relation Age of Onset   Diabetes Sister    Diabetes Brother    Stroke Paternal Grandmother    Diabetes Sister    Diabetes Sister     Past medical history, surgical history, medications, allergies, family history and social history reviewed with patient today and changes made to appropriate areas of the chart.   ROS All other ROS negative except what is listed above and in the HPI.      Objective:    BP 128/70 (BP Location: Left Arm, Patient Position: Sitting, Cuff Size: Large)   Pulse 82   Temp 98.2 F (36.8 C) (Oral)   Ht 5' 4.02" (1.626 m)   Wt 183 lb 1.6 oz (83.1 kg)   LMP  (LMP Unknown)   SpO2 97%   BMI 31.41 kg/m   Wt Readings from Last 3 Encounters:  07/06/22 183 lb 1.6 oz (83.1 kg)  02/14/22 189 lb (85.7 kg)  01/14/22 189 lb 4.8 oz (85.9 kg)    Physical Exam Vitals and nursing note reviewed. Exam conducted with a chaperone present.   Constitutional:      General: She is awake. She is not in acute distress.    Appearance: She is well-developed and well-groomed. She is obese. She is not ill-appearing or toxic-appearing.  HENT:     Head: Normocephalic and atraumatic.     Right Ear: Hearing, tympanic membrane, ear canal and external ear normal. No drainage.     Left Ear: Hearing, tympanic membrane, ear canal and external ear normal. No drainage.     Nose: Nose normal.     Right Sinus: No maxillary sinus tenderness or frontal sinus tenderness.     Left Sinus: No maxillary sinus tenderness or frontal sinus tenderness.  Mouth/Throat:     Mouth: Mucous membranes are moist.     Pharynx: Oropharynx is clear. Uvula midline. No pharyngeal swelling, oropharyngeal exudate or posterior oropharyngeal erythema.  Eyes:     General: Lids are normal.        Right eye: No discharge.        Left eye: No discharge.     Extraocular Movements: Extraocular movements intact.     Conjunctiva/sclera: Conjunctivae normal.     Pupils: Pupils are equal, round, and reactive to light.     Visual Fields: Right eye visual fields normal and left eye visual fields normal.  Neck:     Thyroid: No thyromegaly.     Vascular: No carotid bruit.     Trachea: Trachea normal.  Cardiovascular:     Rate and Rhythm: Normal rate and regular rhythm.     Heart sounds: Normal heart sounds. No murmur heard.    No gallop.  Pulmonary:     Effort: Pulmonary effort is normal. No accessory muscle usage or respiratory distress.     Breath sounds: Normal breath sounds.  Chest:  Breasts:    Right: Normal.     Left: Normal.  Abdominal:     General: Bowel sounds are normal.     Palpations: Abdomen is soft. There is no hepatomegaly or splenomegaly.     Tenderness: There is no abdominal tenderness.  Musculoskeletal:        General: Normal range of motion.     Cervical back: Normal range of motion and neck supple.     Right lower leg: No edema.     Left lower leg:  No edema.  Lymphadenopathy:     Head:     Right side of head: No submental, submandibular, tonsillar, preauricular or posterior auricular adenopathy.     Left side of head: No submental, submandibular, tonsillar, preauricular or posterior auricular adenopathy.     Cervical: No cervical adenopathy.     Upper Body:     Right upper body: No supraclavicular, axillary or pectoral adenopathy.     Left upper body: No supraclavicular, axillary or pectoral adenopathy.  Skin:    General: Skin is warm and dry.     Capillary Refill: Capillary refill takes less than 2 seconds.     Findings: No rash.  Neurological:     Mental Status: She is alert and oriented to person, place, and time.     Gait: Gait is intact.     Deep Tendon Reflexes: Reflexes are normal and symmetric.     Reflex Scores:      Brachioradialis reflexes are 2+ on the right side and 2+ on the left side.      Patellar reflexes are 2+ on the right side and 2+ on the left side. Psychiatric:        Attention and Perception: Attention normal.        Mood and Affect: Mood normal.        Speech: Speech normal.        Behavior: Behavior normal. Behavior is cooperative.        Thought Content: Thought content normal.        Judgment: Judgment normal.   EKG My review and personal interpretation at Time: 1520    Indication: htn  Rate: 84  Rhythm: sinus Axis: normal Other: No nonspecific st abn, no stemi, no lvh  Diabetic Foot Exam - Simple   Simple Foot Form Visual Inspection No deformities, no ulcerations, no  other skin breakdown bilaterally: Yes Sensation Testing Intact to touch and monofilament testing bilaterally: Yes Pulse Check Posterior Tibialis and Dorsalis pulse intact bilaterally: Yes Comments       07/06/2022    5:07 PM  6CIT Screen  What Year? 0 points  What month? 0 points  What time? 0 points  Count back from 20 0 points  Months in reverse 0 points  Repeat phrase 0 points  Total Score 0 points   Results for  orders placed or performed in visit on 07/06/22  Bayer DCA Hb A1c Waived  Result Value Ref Range   HB A1C (BAYER DCA - WAIVED) 8.6 (H) 4.8 - 5.6 %  Microalbumin, Urine Waived  Result Value Ref Range   Microalb, Ur Waived 150 (H) 0 - 19 mg/L   Creatinine, Urine Waived 300 10 - 300 mg/dL   Microalb/Creat Ratio 30-300 (H) <30 mg/g      Assessment & Plan:   Problem List Items Addressed This Visit       Cardiovascular and Mediastinum   Hypertension associated with diabetes (HCC)    Chronic, stable with BP at goal for age in office today. Continue current medication regimen at this time and adjust as needed.  Telmisartan offering kidney protection.  Recommend she check BP at least 3 times a week at home + focus on DASH diet.  Continue to monitor kidney function and if GFR < 45 consider reduction Metformin.  Urine ALB 150 May 2024.  LABS: CMP, TSH, urine ALB.  Refills sent in.      Relevant Medications   amLODipine (NORVASC) 5 MG tablet   telmisartan (MICARDIS) 80 MG tablet   metoprolol succinate (TOPROL-XL) 100 MG 24 hr tablet   atorvastatin (LIPITOR) 40 MG tablet   Other Relevant Orders   Bayer DCA Hb A1c Waived (Completed)   CBC with Differential/Platelet   TSH   Microalbumin, Urine Waived (Completed)   EKG 12-Lead (Completed)     Digestive   Esophagitis    Chronic, stable with no symptoms with Prilosec on board.  Continue current medication regimen and adjust as needed.  Mag level today.  Risks of PPI use were discussed with patient including bone loss, C. Diff diarrhea, pneumonia, infections, CKD, electrolyte abnormalities.  Verbalizes understanding and chooses to continue the medication.       Relevant Orders   Magnesium     Endocrine   Diabetic peripheral neuropathy (HCC)    Chronic, ongoing with A1c trending down to 8.6%, previous 8.8%, just restarted Ozempic.  She is working with pharmacy team on this -- assistance on [place].  Continue Metformin 1000 MG  BID and Levemir 60 units every other night (goal is to discontinue once Ozempic at max and Jardiance back on board) + Ozempic (will send in papers to increase to 2 MG dosing).  Educated her on medications and side effects.  Maintain Gabapentin but reduce to 300 MG at night since also takes Trazodone for sleep.  Return in 3 months. - Eye exam in office today, foot exam done - Statin and ARB on board - Vaccinations up to date      Relevant Medications   telmisartan (MICARDIS) 80 MG tablet   atorvastatin (LIPITOR) 40 MG tablet   gabapentin (NEURONTIN) 300 MG capsule   Other Relevant Orders   Bayer DCA Hb A1c Waived (Completed)   Microalbumin, Urine Waived (Completed)   Hyperlipidemia associated with type 2 diabetes mellitus (HCC)    Chronic, ongoing.  Continue current medication regimen and adjust as needed based on labs. Lipid panel today.      Relevant Medications   amLODipine (NORVASC) 5 MG tablet   telmisartan (MICARDIS) 80 MG tablet   metoprolol succinate (TOPROL-XL) 100 MG 24 hr tablet   atorvastatin (LIPITOR) 40 MG tablet   Other Relevant Orders   Bayer DCA Hb A1c Waived (Completed)   Comprehensive metabolic panel   Lipid Panel w/o Chol/HDL Ratio   Type 2 diabetes mellitus with obesity (HCC)    Chronic, ongoing with A1c trending down to 8.6%, previous 8.8%, just restarted Ozempic.  She is working with pharmacy team on this -- assistance on [place].  Continue Metformin 1000 MG BID and Levemir 60 units every other night (goal is to discontinue once Ozempic at max and Jardiance back on board) + Ozempic (will send in papers to increase to 2 MG dosing).  Educated her on medications and side effects.  Urine ALB 150 May 2024, keep Telmisartan on board for protection.  Return in 3 months. - Eye exam in office today, foot exam done - Statin and ARB on board - Vaccinations up to date      Relevant Medications   telmisartan (MICARDIS) 80 MG tablet   atorvastatin (LIPITOR) 40  MG tablet   Other Relevant Orders   Bayer DCA Hb A1c Waived (Completed)   Microalbumin, Urine Waived (Completed)     Musculoskeletal and Integument   Osteopenia of neck of left femur    New diagnosis on 06/27/22.  Educated on this.  Recommend adequate calcium intake daily and Vitamin D3 2000 units daily + focus on weight bearing exercised for strengthening and fall prevention.      Relevant Orders   VITAMIN D 25 Hydroxy (Vit-D Deficiency, Fractures)     Other   Insomnia    Due to grieving process, stable at this time.  Will continue current medication regimen and adjust as needed.  At present Trazodone beneficial both to mood and sleep pattern.  Denies any SI/HI.  Recommend she continue current support group sittings.        Insulin long-term use (HCC)    Refer to diabetes plan of care.      Relevant Orders   Bayer DCA Hb A1c Waived (Completed)   Vitamin D deficiency    Chronic, ongoing with osteopenia.  Start Vitamin D3 2000 units daily and adjust as needed.      Relevant Orders   VITAMIN D 25 Hydroxy (Vit-D Deficiency, Fractures)   Other Visit Diagnoses     Medicare welcome visit    -  Primary   Welcome to Medicare due and performed with patient today.        Follow up plan: Return in about 3 months (around 10/06/2022) for T2DM, HTN/HLD, OSTEOPENIA.   LABORATORY TESTING:  - Pap smear: not applicable  IMMUNIZATIONS:   - Tdap: Tetanus vaccination status reviewed: last tetanus booster within 10 years. - Influenza: Up to date - Pneumovax: Up to date - Prevnar: Up to date - COVID: Up to date - HPV: Not applicable - Shingrix vaccine: Refused  SCREENING: -Mammogram: Up to date  - Colonoscopy: Up to date  - Bone Density: Up to date  -Hearing Test: Not applicable  -Spirometry: Not applicable   PATIENT COUNSELING:   Advised to take 1 mg of folate supplement per day if capable of pregnancy.   Sexuality: Discussed sexually transmitted diseases, partner selection,  use of condoms, avoidance of unintended  pregnancy  and contraceptive alternatives.   Advised to avoid cigarette smoking.  I discussed with the patient that most people either abstain from alcohol or drink within safe limits (<=14/week and <=4 drinks/occasion for males, <=7/weeks and <= 3 drinks/occasion for females) and that the risk for alcohol disorders and other health effects rises proportionally with the number of drinks per week and how often a drinker exceeds daily limits.  Discussed cessation/primary prevention of drug use and availability of treatment for abuse.   Diet: Encouraged to adjust caloric intake to maintain  or achieve ideal body weight, to reduce intake of dietary saturated fat and total fat, to limit sodium intake by avoiding high sodium foods and not adding table salt, and to maintain adequate dietary potassium and calcium preferably from fresh fruits, vegetables, and low-fat dairy products.    Stressed the importance of regular exercise  Injury prevention: Discussed safety belts, safety helmets, smoke detector, smoking near bedding or upholstery.   Dental health: Discussed importance of regular tooth brushing, flossing, and dental visits.    NEXT PREVENTATIVE PHYSICAL DUE IN 1 YEAR. Return in about 3 months (around 10/06/2022) for T2DM, HTN/HLD, OSTEOPENIA.

## 2022-07-06 NOTE — Assessment & Plan Note (Signed)
Due to grieving process, stable at this time.  Will continue current medication regimen and adjust as needed.  At present Trazodone beneficial both to mood and sleep pattern.  Denies any SI/HI.  Recommend she continue current support group sittings.

## 2022-07-06 NOTE — Assessment & Plan Note (Signed)
Chronic, ongoing with A1c trending down to 8.6%, previous 8.8%, just restarted Ozempic.  She is working with pharmacy team on this -- assistance on [place].  Continue Metformin 1000 MG BID and Levemir 60 units every other night (goal is to discontinue once Ozempic at max and Jardiance back on board) + Ozempic (will send in papers to increase to 2 MG dosing).  Educated her on medications and side effects.  Urine ALB 150 May 2024, keep Telmisartan on board for protection.  Return in 3 months. - Eye exam in office today, foot exam done - Statin and ARB on board - Vaccinations up to date

## 2022-07-06 NOTE — Assessment & Plan Note (Signed)
New diagnosis on 06/27/22.  Educated on this.  Recommend adequate calcium intake daily and Vitamin D3 2000 units daily + focus on weight bearing exercised for strengthening and fall prevention.

## 2022-07-06 NOTE — Assessment & Plan Note (Signed)
Chronic, ongoing with osteopenia.  Start Vitamin D3 2000 units daily and adjust as needed.

## 2022-07-06 NOTE — Assessment & Plan Note (Signed)
Refer to diabetes plan of care. ?

## 2022-07-07 ENCOUNTER — Telehealth: Payer: Self-pay | Admitting: Nurse Practitioner

## 2022-07-07 LAB — COMPREHENSIVE METABOLIC PANEL
ALT: 26 IU/L (ref 0–32)
AST: 22 IU/L (ref 0–40)
Albumin/Globulin Ratio: 2.3 — ABNORMAL HIGH (ref 1.2–2.2)
Albumin: 4.5 g/dL (ref 3.9–4.9)
Alkaline Phosphatase: 74 IU/L (ref 44–121)
BUN/Creatinine Ratio: 18 (ref 12–28)
BUN: 13 mg/dL (ref 8–27)
Bilirubin Total: 0.7 mg/dL (ref 0.0–1.2)
CO2: 24 mmol/L (ref 20–29)
Calcium: 10.1 mg/dL (ref 8.7–10.3)
Chloride: 98 mmol/L (ref 96–106)
Creatinine, Ser: 0.74 mg/dL (ref 0.57–1.00)
Globulin, Total: 2 g/dL (ref 1.5–4.5)
Glucose: 145 mg/dL — ABNORMAL HIGH (ref 70–99)
Potassium: 4.4 mmol/L (ref 3.5–5.2)
Sodium: 140 mmol/L (ref 134–144)
Total Protein: 6.5 g/dL (ref 6.0–8.5)
eGFR: 90 mL/min/{1.73_m2} (ref >59)

## 2022-07-07 LAB — MAGNESIUM: Magnesium: 1.6 mg/dL (ref 1.6–2.3)

## 2022-07-07 LAB — CBC WITH DIFFERENTIAL/PLATELET
Basophils Absolute: 0 10*3/uL (ref 0.0–0.2)
Basos: 0 %
EOS (ABSOLUTE): 0 10*3/uL (ref 0.0–0.4)
Eos: 0 %
Hematocrit: 43.1 % (ref 34.0–46.6)
Hemoglobin: 14.3 g/dL (ref 11.1–15.9)
Immature Grans (Abs): 0 10*3/uL (ref 0.0–0.1)
Immature Granulocytes: 0 %
Lymphocytes Absolute: 2.7 10*3/uL (ref 0.7–3.1)
Lymphs: 34 %
MCH: 27.1 pg (ref 26.6–33.0)
MCHC: 33.2 g/dL (ref 31.5–35.7)
MCV: 82 fL (ref 79–97)
Monocytes Absolute: 0.4 10*3/uL (ref 0.1–0.9)
Monocytes: 6 %
Neutrophils Absolute: 4.9 10*3/uL (ref 1.4–7.0)
Neutrophils: 60 %
Platelets: 279 10*3/uL (ref 150–450)
RBC: 5.28 x10E6/uL (ref 3.77–5.28)
RDW: 14.2 % (ref 11.7–15.4)
WBC: 8.1 10*3/uL (ref 3.4–10.8)

## 2022-07-07 LAB — LIPID PANEL W/O CHOL/HDL RATIO
Cholesterol, Total: 94 mg/dL — ABNORMAL LOW (ref 100–199)
HDL: 44 mg/dL (ref >39)
LDL Chol Calc (NIH): 17 mg/dL (ref 0–99)
Triglycerides: 212 mg/dL — ABNORMAL HIGH (ref 0–149)
VLDL Cholesterol Cal: 33 mg/dL (ref 5–40)

## 2022-07-07 LAB — VITAMIN D 25 HYDROXY (VIT D DEFICIENCY, FRACTURES): Vit D, 25-Hydroxy: 30.9 ng/mL (ref 30.0–100.0)

## 2022-07-07 LAB — TSH: TSH: 1.29 u[IU]/mL (ref 0.450–4.500)

## 2022-07-07 NOTE — Progress Notes (Signed)
Contacted via MyChart   Good afternoon April Johnson, your labs have returned and overall look fantastic!!!  I have no concerns on these.  Triglycerides mildly elevated, would focus on diet changes and regular activity.  Continue all current medications.  No changes needed.  Any questions? Keep being amazing!!  Thank you for allowing me to participate in your care.  I appreciate you. Kindest regards, Phi Avans

## 2022-07-07 NOTE — Telephone Encounter (Signed)
[facility] pharmacist stated Levamir insulin has been discontinued by manufacturer. Would like to give PCP alternatives Lantus SoloStar,Tresiba FlexTouch,Toujeo SoloStar  Preferred [facility]acy [phone] - [place], [place] - [address]  [address] [place] [address]  Phone: [phone] Fax: [phone]  Hours: Not open 24 hours   Please advise.

## 2022-07-08 MED ORDER — INSULIN DEGLUDEC 100 UNIT/ML ~~LOC~~ SOPN: 60.0000 [IU] | PEN_INJECTOR | Freq: Every day | SUBCUTANEOUS | 4 refills | Status: DC

## 2022-07-11 ENCOUNTER — Telehealth: Payer: Self-pay

## 2022-07-12 MED ORDER — EMBRACE BLOOD GLUCOSE MONITOR DEVI: 0 refills | Status: DC

## 2022-07-12 NOTE — Progress Notes (Signed)
   07/12/2022  [name], female   DOB: [birth year], 67 y.o.   MRN: [id number]  Subjective/Objective: Patient outreach to return missed called from patient.    DM -[name] received notification yesterday through [facility] that her PAP application for Jardiance had been denied -States BG has been doing well, with a reading of 145 yesterday afternoon (not a fasting value) -Continues to take Ozempic 1mg , as she has this dose remaining from PAP shipment- tolerating dose well with no adverse GI effects -States she is needing new Embrace meter if insurance will cover a replacement  Assessment/Plan  DM -Contacted [employer], and she was actually approved for PAP and Jardiance shipped to her today.  Initially some information was missing from application, but they later received lacking information.  Will make patient aware. -Continue 1mg  Ozempic weekly for now; will discuss potentially increasing to 2mg  at next appointment -Pending prescription for new meter.  If Embrace not covered by insurance, it should be $10-15 out of pocket.  Patient can purchase this way if needed/preferred to use the strips and lancets she has on hand; or we can change all supplies to brand preferred on insurance.  Follow-up:  Telephone visit already scheduled for 5/17 to check on home BG- will also see what she decided to do glucometer wise  Lenna Gilford, PharmD, DPLA

## 2022-07-14 ENCOUNTER — Encounter: Payer: Self-pay | Admitting: Nurse Practitioner

## 2022-07-20 ENCOUNTER — Telehealth: Payer: Self-pay

## 2022-07-20 NOTE — Telephone Encounter (Signed)
4 boxes of Ozempic received for the patient this morning. Called and notified her that it was ready to be picked up.

## 2022-07-21 NOTE — Telephone Encounter (Signed)
Medication picked up by the patient.  

## 2022-07-22 ENCOUNTER — Other Ambulatory Visit: Payer: Medicare Other

## 2022-07-22 NOTE — Progress Notes (Unsigned)
   07/22/2022  [name], female   DOB: [birth year], 66 y.o.   MRN: [id number]  Subjective/Objective: Telephone visit to check on Jardiance PAP progress and patient home BG  Diabetes Management Plan -Ms. Herres has received the Jardiance 25mg  daily provided through PAP; she took her first dose Saturday and is tolerating well so far -Continues Ozempic 1mg  weekly and endorses very mild stomach upset on rare occasion- she has two months remaining on hand from PAP -Currently not checking home BP regularly and has not picked up new monitor and supplies at pharmacy yet  Assessment/Plan:  Diabetes Management Plan -Recommend to continue Ozempic 1mg  weekly until no adverse GI side effects present -Pick up diabetic testing supplies from pharmacy and monitor BG and record daily  Follow-up: Telephone visit 6/18 to check on home BG  Lenna Gilford, PharmD, DPLA

## 2022-07-26 NOTE — Telephone Encounter (Signed)
Pt has been approved for [facility] pt assistance and is enrolled until 03/07/2023

## 2022-07-27 ENCOUNTER — Other Ambulatory Visit: Payer: Self-pay | Admitting: Nurse Practitioner

## 2022-07-27 NOTE — Telephone Encounter (Signed)
Requested medication (s) are due for refill today: No  Requested medication (s) are on the active medication list: Yes  Last refill:  07/12/22  Future visit scheduled: Yes  Notes to clinic:  Pharmacy requesting prior authorization.    Requested Prescriptions  Pending Prescriptions Disp Refills   Blood Glucose Monitoring Suppl (EMBRACE PRO GLUCOSE METER) DEVI [Pharmacy Med Name: EMBRACE PRO BLOOD GLUCOSE MTR] 1 each 0    Sig: USE TO CHECK BLOOD GLUCOSE DAILY     Endocrinology: Diabetes - Testing Supplies Passed - 07/27/2022  3:02 PM      Passed - Valid encounter within last 12 months    Recent Outpatient Visits           3 weeks ago Medicare welcome visit   [place] [facility] [place], [place] T, NP   5 months ago Type 2 diabetes mellitus with obesity (HCC)   [place] [facility] [place], [place] T, NP   6 months ago Type 2 diabetes mellitus with obesity (HCC)   [place] [facility] [place], [place] T, NP   9 months ago Type 2 diabetes mellitus with obesity (HCC)   [place] [facility] [place], Corrie Dandy T, NP   1 year ago Type 2 diabetes mellitus with obesity (HCC)   [place] [facility] [place], Dorie Rank, NP       Future Appointments             In 2 months Cannady, Dorie Rank, NP [facility], [facility]

## 2022-07-28 ENCOUNTER — Other Ambulatory Visit: Payer: Self-pay | Admitting: Nurse Practitioner

## 2022-07-28 NOTE — Telephone Encounter (Signed)
Requested medication (s) are due for refill today - no  Requested medication (s) are on the active medication list -yes  Future visit scheduled -yes  Last refill: 07/28/22  Notes to clinic: Pharmacy request: Alternative requested- brand not available at this location  Requested Prescriptions  Pending Prescriptions Disp Refills   Blood Glucose Monitoring Suppl (ACCU-CHEK GUIDE) w/Device KIT [Pharmacy Med Name: ACCU-CHEK GUIDE MONITOR SYSTEM]  0     Endocrinology: Diabetes - Testing Supplies Passed - 07/28/2022  1:13 PM      Passed - Valid encounter within last 12 months    Recent Outpatient Visits           3 weeks ago Medicare welcome visit   [place] [facility] [place], [place] T, NP   5 months ago Type 2 diabetes mellitus with obesity (HCC)   [place] [facility] [place], [place] T, NP   6 months ago Type 2 diabetes mellitus with obesity (HCC)   [place] [facility] [place], [place] T, NP   9 months ago Type 2 diabetes mellitus with obesity (HCC)   [place] [facility] [place], Corrie Dandy T, NP   1 year ago Type 2 diabetes mellitus with obesity (HCC)   [place] [facility] [place], Dorie Rank, NP       Future Appointments             In 2 months Cannady, Dorie Rank, NP [place] [facility], [facility]               Requested Prescriptions  Pending Prescriptions Disp Refills   Blood Glucose Monitoring Suppl (ACCU-CHEK GUIDE) w/Device KIT [Pharmacy Med Name: ACCU-CHEK GUIDE MONITOR SYSTEM]  0     Endocrinology: Diabetes - Testing Supplies Passed - 07/28/2022  1:13 PM      Passed - Valid encounter within last 12 months    Recent Outpatient Visits           3 weeks ago Medicare welcome visit   [place] [facility] [place], [place] T, NP   5 months ago Type 2 diabetes mellitus with obesity (HCC)   [place] [facility] [place], [place] T, NP   6 months ago  Type 2 diabetes mellitus with obesity (HCC)   [place] [facility] [place], [place] T, NP   9 months ago Type 2 diabetes mellitus with obesity (HCC)   [place] [facility] [place], Corrie Dandy T, NP   1 year ago Type 2 diabetes mellitus with obesity (HCC)   [place] [facility] [place], Dorie Rank, NP       Future Appointments             In 2 months Cannady, Dorie Rank, NP [place] [facility], [facility]

## 2022-08-15 ENCOUNTER — Other Ambulatory Visit: Payer: Self-pay | Admitting: Nurse Practitioner

## 2022-08-15 ENCOUNTER — Ambulatory Visit (INDEPENDENT_AMBULATORY_CARE_PROVIDER_SITE_OTHER): Payer: Medicare Other

## 2022-08-15 DIAGNOSIS — E1169 Type 2 diabetes mellitus with other specified complication: Secondary | ICD-10-CM

## 2022-08-15 DIAGNOSIS — E1142 Type 2 diabetes mellitus with diabetic polyneuropathy: Secondary | ICD-10-CM

## 2022-08-15 DIAGNOSIS — E1159 Type 2 diabetes mellitus with other circulatory complications: Secondary | ICD-10-CM

## 2022-08-15 DIAGNOSIS — E669 Obesity, unspecified: Secondary | ICD-10-CM

## 2022-08-15 MED ORDER — NYSTATIN 100000 UNIT/GM EX POWD
1.0000 | Freq: Three times a day (TID) | CUTANEOUS | 5 refills | Status: DC
Start: 2022-08-15 — End: 2023-10-16

## 2022-08-15 NOTE — Patient Instructions (Signed)
Please call the care guide team at [phone] if you need to cancel or reschedule your appointment.   If you are experiencing a Mental Health or Behavioral Health Crisis or need someone to talk to, please call the Suicide and Crisis Lifeline: 988 call the [place] National Suicide Prevention Lifeline: 480-878-3466 or TTY: 3857748326 TTY 816-616-7010) to talk to a trained counselor call 1-800-273-TALK (toll free, 24 hour hotline)   Following is a copy of the CCM Program Consent:  CCM service includes personalized support from designated clinical staff supervised by the physician, including individualized plan of care and coordination with other care providers 24/7 contact phone numbers for assistance for urgent and routine care needs. Service will only be billed when office clinical staff spend 20 minutes or more in a month to coordinate care. Only one practitioner may furnish and bill the service in a calendar month. The patient may stop CCM services at amy time (effective at the end of the month) by phone call to the office staff. The patient will be responsible for cost sharing (co-pay) or up to 20% of the service fee (after annual deductible is met)  Following is a copy of your full provider care plan:   Goals Addressed             This Visit's Progress    CCM Expected Outcome:  Monitor, Self-Manage and Reduce Symptoms of Diabetes       Current Barriers:  Care Coordination needs related to cost constraints related to medications she is prescribed for management of blood sugars and keeping A1C in goal in a patient with DM Chronic Disease Management support and education needs related to effective management of DM Financial Constraints.  Difficulty obtaining medications  Lab Results  Component Value Date   HGBA1C 8.6 (H) 07/06/2022   The patient states when she is on Ozempic her A1C is down to 5.7  Planned Interventions: Provided education to patient about basic DM disease  process. The patient has gotten her Ozempic. Her blood sugars has been doing good. She feels like her DM are better controlled. She is happy to have her Ozempic back.  Reviewed medications with patient and discussed importance of medication adherence.The patient has her Ozempic and is happy to have it now. She works with the pharm D on a regular basis. ;        Reviewed prescribed diet with patient heart healthy/ADA diet. States she has cut out pastas and breads. She is mindful of her dietary intake. Education and support given. She has been having elevated blood sugars.  Counseled on importance of regular laboratory monitoring as prescribed. Has regular lab work done;        Discussed plans with patient for ongoing care management follow up and provided patient with direct contact information for care management team;      Provided patient with written educational materials related to hypo and hyperglycemia and importance of correct treatment; The patient states that she has been having good blood sugars. She was 103 this am and at night it is around 143. Denies any extreme lows or highs   Reviewed scheduled/upcoming provider appointments including: 10-07-2022 at 1 pm     Advised patient, providing education and rationale, to check cbg when you have symptoms of low or high blood sugar and as directed   and record. Her blood sugars have been around 103 in the mornings and 143 at night.  She has lost about 14 pounds too. She is feeling  great.  Education on the goal of fasting <130 and post prandial of <180.  The patient wants to get her blood sugars down.    call provider for findings outside established parameters;       Referral made to pharmacy team for assistance with cost constraints related to Ozempic. The patient is working with the pharm D on a regular basis has gotten approved for [employer] and help with [place]. She is thankful for the support she gets from the team.  Review of patient status,  including review of consultants reports, relevant laboratory and other test results, and medications completed;       Advised patient to discuss if there are samples available in the office at her upcoming office visit, questions and concerns about her DM with provider. Wants to talk to the pcp about a continuous glucose meter;      Screening for signs and symptoms of depression related to chronic disease state;        Assessed social determinant of health barriers;      Supplied education on the [place] program at [email or web address] for help with grief support after the loss of her husband, Toney Sang last year in June. The patient is thankful she has a good support system from her family and friends. Reflective listening and support given.   The patient states that her husbands death anniversary is the 23rd and it will be 2 years and their wedding anniversary would have been 39 years on the 28th. The patient is doing okay but knows that this is going to be hard days. Reflective listening and support given. Will continue to monitor for changes and needs. The patient states that she has lost weight and sometimes she sweats under her abdominal folds. She states that it gets yeast and has a smell. She ask about getting some nystatin powder ordered. She has used this before. Secure message sent to the pcp and nystatin powder has been ordered. Will continue to monitor for new changes or needs.   Symptom Management: Take medications as prescribed   Attend all scheduled provider appointments Call provider office for new concerns or questions  call the Suicide and Crisis Lifeline: 988 call the [place] National Suicide Prevention Lifeline: (506)421-4504 or TTY: 385-721-5865 TTY 414-350-2978) to talk to a trained counselor call 1-800-273-TALK (toll free, 24 hour hotline) if experiencing a Mental Health or Behavioral Health Crisis  schedule appointment with eye doctor check feet daily for cuts, sores or redness trim  toenails straight across manage portion size wash and dry feet carefully every day wear comfortable, cotton socks wear comfortable, well-fitting shoes  Follow Up Plan: Telephone follow up appointment with care management team member scheduled for: 10-24-2022 at 1145 am       CCM Expected Outcome:  Monitor, Self-Manage and Reduce Symptoms of: HLD       Current Barriers:  Chronic Disease Management support and education needs related to effective management of HLD Lab Results  Component Value Date   CHOL 94 (L) 07/06/2022   HDL 44 07/06/2022   LDLCALC 17 07/06/2022   TRIG 212 (H) 07/06/2022   CHOLHDL 3.2 01/24/2018     Planned Interventions: Provider established cholesterol goals reviewed. Review and education provided. Had labs recently. Review of labs and goals; Counseled on importance of regular laboratory monitoring as prescribed. Has regular lab work; Provided HLD Transport planner. Review of foods that are high in cholesterol and monitoring her dietary restrictions; Reviewed role and benefits of statin for ASCVD  risk reduction; Discussed strategies to manage statin-induced myalgias. Takes Lipitor 40 mg QD; Reviewed importance of limiting foods high in cholesterol. The patient is monitoring her dietary intake and trying to eat healthy. Has eliminated certain foods in her diet. The patient denies issues with dietary restrictions. Education and support given; Reviewed exercise goals and target of 150 minutes per week; Screening for signs and symptoms of depression related to chronic disease state;  Assessed social determinant of health barriers;   Symptom Management: Take medications as prescribed   Attend all scheduled provider appointments Call provider office for new concerns or questions  call the Suicide and Crisis Lifeline: 988 call the [place] National Suicide Prevention Lifeline: 385-123-4474 or TTY: (619) 443-9937 TTY 986-444-5754) to talk to a trained counselor call  1-800-273-TALK (toll free, 24 hour hotline) if experiencing a Mental Health or Behavioral Health Crisis  - call for medicine refill 2 or 3 days before it runs out - take all medications exactly as prescribed - call doctor with any symptoms you believe are related to your medicine - call doctor when you experience any new symptoms - go to all doctor appointments as scheduled  Follow Up Plan: Telephone follow up appointment with care management team member scheduled for: 10-24-2022 at 1145 am       CCM Expected Outcome:  Monitor, Self-Manage, and Reduce Symptoms of Hypertension       Current Barriers:  Chronic Disease Management support and education needs related to effective management of HTN BP Readings from Last 3 Encounters:  07/06/22 128/70  02/14/22 134/78  01/14/22 136/70     Planned Interventions: Evaluation of current treatment plan related to hypertension self management and patient's adherence to plan as established by provider. The patient has good control of HTN at this time. Denies any changes in her HTN or heart health. Provided education to patient re: stroke prevention, s/s of heart attack and stroke. The patient has a good understanding of monitoring for changes in condition that warrant emergent care for HA and stroke; Reviewed prescribed diet heart healthy/ADA diet. Review of heart healthy/ADA diet. The patient is compliant with Heart Healthy/ADA diet. The patient states that she is losing weight and happy about that since being back on Ozempic. Reviewed medications with patient and discussed importance of compliance. The patient is compliant with medications. Working with the pharm D for ongoing support and education;  Discussed plans with patient for ongoing care management follow up and provided patient with direct contact information for care management team; Advised patient, providing education and rationale, to monitor blood pressure daily and record, calling PCP for  findings outside established parameters;  Reviewed scheduled/upcoming provider appointments including: 10-07-2022 at 100 pm Advised patient to discuss changes in blood pressures or heart health with provider; Provided education on prescribed diet heart healthy/ADA diet. Review and education provided ;  Discussed complications of poorly controlled blood pressure such as heart disease, stroke, circulatory complications, vision complications, kidney impairment, sexual dysfunction;  Screening for signs and symptoms of depression related to chronic disease state;  Assessed social determinant of health barriers;   Symptom Management: Take medications as prescribed   Attend all scheduled provider appointments Call provider office for new concerns or questions  call the Suicide and Crisis Lifeline: 988 call the [place] National Suicide Prevention Lifeline: 516 454 9046 or TTY: (323)619-5278 TTY 620 573 9105) to talk to a trained counselor call 1-800-273-TALK (toll free, 24 hour hotline) if experiencing a Mental Health or Behavioral Health Crisis  check blood pressure weekly  learn about high blood pressure keep a blood pressure log take blood pressure log to all doctor appointments call doctor for signs and symptoms of high blood pressure develop an action plan for high blood pressure keep all doctor appointments take medications for blood pressure exactly as prescribed report new symptoms to your doctor  Follow Up Plan: Telephone follow up appointment with care management team member scheduled for: 10-24-2022 at 1145 am          Patient verbalizes understanding of instructions and care plan provided today and agrees to view in MyChart. Active MyChart status and patient understanding of how to access instructions and care plan via MyChart confirmed with patient.  Telephone follow up appointment with care management team member scheduled for: 10-24-2022 at 1145 am

## 2022-08-15 NOTE — Chronic Care Management (AMB) (Signed)
Chronic Care Management   CCM RN Visit Note  08/15/2022 Name: April Johnson MRN: [id number] DOB: 19-Dec-1955  Subjective: April Johnson is a 67 y.o. year old female who is a primary care patient of Cannady, Dorie Rank, NP. The patient was referred to the Chronic Care Management team for assistance with care management needs subsequent to provider initiation of CCM services and plan of care.    Today's Visit:  Engaged with patient by telephone for follow up visit.        Goals Addressed             This Visit's Progress    CCM Expected Outcome:  Monitor, Self-Manage and Reduce Symptoms of Diabetes       Current Barriers:  Care Coordination needs related to cost constraints related to medications she is prescribed for management of blood sugars and keeping A1C in goal in a patient with DM Chronic Disease Management support and education needs related to effective management of DM Financial Constraints.  Difficulty obtaining medications  Lab Results  Component Value Date   HGBA1C 8.6 (H) 07/06/2022   The patient states when she is on Ozempic her A1C is down to 5.7  Planned Interventions: Provided education to patient about basic DM disease process. The patient has gotten her Ozempic. Her blood sugars has been doing good. She feels like her DM are better controlled. She is happy to have her Ozempic back.  Reviewed medications with patient and discussed importance of medication adherence.The patient has her Ozempic and is happy to have it now. She works with the pharm D on a regular basis. ;        Reviewed prescribed diet with patient heart healthy/ADA diet. States she has cut out pastas and breads. She is mindful of her dietary intake. Education and support given. She has been having elevated blood sugars.  Counseled on importance of regular laboratory monitoring as prescribed. Has regular lab work done;        Discussed plans with patient for ongoing care management  follow up and provided patient with direct contact information for care management team;      Provided patient with written educational materials related to hypo and hyperglycemia and importance of correct treatment; The patient states that she has been having good blood sugars. She was 103 this am and at night it is around 143. Denies any extreme lows or highs   Reviewed scheduled/upcoming provider appointments including: 10-07-2022 at 1 pm     Advised patient, providing education and rationale, to check cbg when you have symptoms of low or high blood sugar and as directed   and record. Her blood sugars have been around 103 in the mornings and 143 at night.  She has lost about 14 pounds too. She is feeling great.  Education on the goal of fasting <130 and post prandial of <180.  The patient wants to get her blood sugars down.    call provider for findings outside established parameters;       Referral made to pharmacy team for assistance with cost constraints related to Ozempic. The patient is working with the pharm D on a regular basis has gotten approved for [employer] and help with [place]. She is thankful for the support she gets from the team.  Review of patient status, including review of consultants reports, relevant laboratory and other test results, and medications completed;       Advised patient to discuss if there are samples  available in the office at her upcoming office visit, questions and concerns about her DM with provider. Wants to talk to the pcp about a continuous glucose meter;      Screening for signs and symptoms of depression related to chronic disease state;        Assessed social determinant of health barriers;      Supplied education on the [place] program at [email or web address] for help with grief support after the loss of her husband, Toney Sang last year in June. The patient is thankful she has a good support system from her family and friends. Reflective listening and support given.    The patient states that her husbands death anniversary is the 23rd and it will be 2 years and their wedding anniversary would have been 39 years on the 28th. The patient is doing okay but knows that this is going to be hard days. Reflective listening and support given. Will continue to monitor for changes and needs. The patient states that she has lost weight and sometimes she sweats under her abdominal folds. She states that it gets yeast and has a smell. She ask about getting some nystatin powder ordered. She has used this before. Secure message sent to the pcp and nystatin powder has been ordered. Will continue to monitor for new changes or needs.   Symptom Management: Take medications as prescribed   Attend all scheduled provider appointments Call provider office for new concerns or questions  call the Suicide and Crisis Lifeline: 988 call the [place] National Suicide Prevention Lifeline: 763-390-5836 or TTY: 530 217 2731 TTY 7064159426) to talk to a trained counselor call 1-800-273-TALK (toll free, 24 hour hotline) if experiencing a Mental Health or Behavioral Health Crisis  schedule appointment with eye doctor check feet daily for cuts, sores or redness trim toenails straight across manage portion size wash and dry feet carefully every day wear comfortable, cotton socks wear comfortable, well-fitting shoes  Follow Up Plan: Telephone follow up appointment with care management team member scheduled for: 10-24-2022 at 1145 am       CCM Expected Outcome:  Monitor, Self-Manage and Reduce Symptoms of: HLD       Current Barriers:  Chronic Disease Management support and education needs related to effective management of HLD Lab Results  Component Value Date   CHOL 94 (L) 07/06/2022   HDL 44 07/06/2022   LDLCALC 17 07/06/2022   TRIG 212 (H) 07/06/2022   CHOLHDL 3.2 01/24/2018     Planned Interventions: Provider established cholesterol goals reviewed. Review and education provided. Had  labs recently. Review of labs and goals; Counseled on importance of regular laboratory monitoring as prescribed. Has regular lab work; Provided HLD Transport planner. Review of foods that are high in cholesterol and monitoring her dietary restrictions; Reviewed role and benefits of statin for ASCVD risk reduction; Discussed strategies to manage statin-induced myalgias. Takes Lipitor 40 mg QD; Reviewed importance of limiting foods high in cholesterol. The patient is monitoring her dietary intake and trying to eat healthy. Has eliminated certain foods in her diet. The patient denies issues with dietary restrictions. Education and support given; Reviewed exercise goals and target of 150 minutes per week; Screening for signs and symptoms of depression related to chronic disease state;  Assessed social determinant of health barriers;   Symptom Management: Take medications as prescribed   Attend all scheduled provider appointments Call provider office for new concerns or questions  call the Suicide and Crisis Lifeline: 988 call the [place] National Suicide Prevention Lifeline:  403 486 9671 or TTY: (505)613-8896 TTY 513-594-4220) to talk to a trained counselor call 1-800-273-TALK (toll free, 24 hour hotline) if experiencing a Mental Health or Behavioral Health Crisis  - call for medicine refill 2 or 3 days before it runs out - take all medications exactly as prescribed - call doctor with any symptoms you believe are related to your medicine - call doctor when you experience any new symptoms - go to all doctor appointments as scheduled  Follow Up Plan: Telephone follow up appointment with care management team member scheduled for: 10-24-2022 at 1145 am       CCM Expected Outcome:  Monitor, Self-Manage, and Reduce Symptoms of Hypertension       Current Barriers:  Chronic Disease Management support and education needs related to effective management of HTN BP Readings from Last 3 Encounters:   07/06/22 128/70  02/14/22 134/78  01/14/22 136/70     Planned Interventions: Evaluation of current treatment plan related to hypertension self management and patient's adherence to plan as established by provider. The patient has good control of HTN at this time. Denies any changes in her HTN or heart health. Provided education to patient re: stroke prevention, s/s of heart attack and stroke. The patient has a good understanding of monitoring for changes in condition that warrant emergent care for HA and stroke; Reviewed prescribed diet heart healthy/ADA diet. Review of heart healthy/ADA diet. The patient is compliant with Heart Healthy/ADA diet. The patient states that she is losing weight and happy about that since being back on Ozempic. Reviewed medications with patient and discussed importance of compliance. The patient is compliant with medications. Working with the pharm D for ongoing support and education;  Discussed plans with patient for ongoing care management follow up and provided patient with direct contact information for care management team; Advised patient, providing education and rationale, to monitor blood pressure daily and record, calling PCP for findings outside established parameters;  Reviewed scheduled/upcoming provider appointments including: 10-07-2022 at 100 pm Advised patient to discuss changes in blood pressures or heart health with provider; Provided education on prescribed diet heart healthy/ADA diet. Review and education provided ;  Discussed complications of poorly controlled blood pressure such as heart disease, stroke, circulatory complications, vision complications, kidney impairment, sexual dysfunction;  Screening for signs and symptoms of depression related to chronic disease state;  Assessed social determinant of health barriers;   Symptom Management: Take medications as prescribed   Attend all scheduled provider appointments Call provider office for new  concerns or questions  call the Suicide and Crisis Lifeline: 988 call the [place] National Suicide Prevention Lifeline: 360-384-0801 or TTY: 819-119-0297 TTY (213)463-5571) to talk to a trained counselor call 1-800-273-TALK (toll free, 24 hour hotline) if experiencing a Mental Health or Behavioral Health Crisis  check blood pressure weekly learn about high blood pressure keep a blood pressure log take blood pressure log to all doctor appointments call doctor for signs and symptoms of high blood pressure develop an action plan for high blood pressure keep all doctor appointments take medications for blood pressure exactly as prescribed report new symptoms to your doctor  Follow Up Plan: Telephone follow up appointment with care management team member scheduled for: 10-24-2022 at 1145 am          Plan:Telephone follow up appointment with care management team member scheduled for:  10-24-2022 at 1145 am  Alto Denver RN, MSN, CCM RN Care Manager  Chronic Care Management Direct Number: [phone]

## 2022-08-23 ENCOUNTER — Other Ambulatory Visit: Payer: Medicare Other

## 2022-08-23 NOTE — Progress Notes (Signed)
   08/23/2022  [name], female   DOB: 17-Aug-1955, 67 y.o.   MRN: [id number]  Patient outreach for scheduled telephone follow-up to check on management of DM was unsuccessful.  Tried to call and left voicemail x2.  Instructed patient just to call back at her convenience for [place] to check in.   Lenna Gilford, PharmD, DPLA

## 2022-08-30 ENCOUNTER — Other Ambulatory Visit: Payer: Medicare Other

## 2022-08-30 NOTE — Progress Notes (Signed)
   08/30/2022  [name], female   DOB: Jan 17, 1956, 67 y.o.   MRN: [id number]  S/O: Telephone visit to check on diabetes mangament  DM -Current medications:  Jardiance 25mg  daily, Tresiba 60 units at night, metformin 1000mg  BID, Ozempic 1mg  weekly -Patient states FBG 107-134 -She is not taking Tresiba at night if BG is <130 -Endorses a decrease in appetite, and states she is tolerating Ozempic 1mg  weekly well with minimal stomach upset.  States she can identify certain foods that seem to aggravate and has been avoiding -Currently has 7 doses of 1mg  Ozempic on hand, and 3 boxes of 2mg  to use after this is depleted -Has been taking Jardiance 25mg  daily for approximately a month now and tolerating well, with improvement in FBG readings noted  A/P:  DM -Recommend that patient start to ease into a 2mg  weekly dose by giving 1mg  twice weekly (Monday and Thursday) -Decrease Tresiba to 50 units nightly if BG >130; do not take if BG<130  Follow-up:  Telephone visit in 3 weeks to check on tolerance of Ozempic 2mg  weekly but patient to call if any needs before visit  Lenna Gilford, PharmD, DPLA

## 2022-09-04 DIAGNOSIS — I10 Essential (primary) hypertension: Secondary | ICD-10-CM | POA: Diagnosis not present

## 2022-09-04 DIAGNOSIS — E785 Hyperlipidemia, unspecified: Secondary | ICD-10-CM

## 2022-09-04 DIAGNOSIS — Z794 Long term (current) use of insulin: Secondary | ICD-10-CM | POA: Diagnosis not present

## 2022-09-04 DIAGNOSIS — E1159 Type 2 diabetes mellitus with other circulatory complications: Secondary | ICD-10-CM | POA: Diagnosis not present

## 2022-09-21 ENCOUNTER — Other Ambulatory Visit: Payer: Medicare Other

## 2022-09-21 NOTE — Progress Notes (Signed)
   09/21/2022  [name], female   DOB: Dec 06, 1955, 67 y.o.   MRN: [id number]  S/O Telephone visit to check on diabetes management  Diabetes Management Plan -Current medications:  Jardiance 25mg  daily, Tresiba 30 units if BG>130, metformin 1000mg  BID, Ozempic 2mg  -FBG 101 -Endorses one instance of hypoglycemia but states she got busy and had not eaten -States she has lost approximately 20lbs -Has 1 box of Ozempic 1mg  and 3 boxes of 2mg  on hand  A/P  Diabetes Management Plan -Sees PCP 8/6; recommend A1c at this time -Continue current medication regimen -Contacted [facility] and next refill of Ozempic 2mg  is set to ship 8/3 and should arrive at [facility] 10-14 business days after  Follow-up:  Telephone visit 10/17  Lenna Gilford, PharmD, DPLA

## 2022-09-30 ENCOUNTER — Telehealth: Payer: Self-pay

## 2022-09-30 NOTE — Telephone Encounter (Signed)
4 boxes of Ozempic received for the patient. Called and notified patient that her medication was ready to be picked up.

## 2022-10-05 NOTE — Telephone Encounter (Signed)
Patient came in today and picked up the medication. Patient states that they did not send the correct dose. 1 mg injections were sent and patient is supposed to be on 2 mg injections. I advised the patient to go ahead and keep the medication, she states she has been taking 2 of the 1 mg doses anyway but requests that she have the 2 mg sent for her. Printed off new order form to be filled out for the 2 mg dose. Form has been filled out and placed in the providers folder for signature. Will fax in order form with the correct dose once it is signed.

## 2022-10-06 NOTE — Telephone Encounter (Signed)
New order form faxed to [employer].

## 2022-10-06 NOTE — Patient Instructions (Signed)
Be Involved in Caring For Your Health:  Taking Medications When medications are taken as directed, they can greatly improve your health. But if they are not taken as prescribed, they may not work. In some cases, not taking them correctly can be harmful. To help ensure your treatment remains effective and safe, understand your medications and how to take them. Bring your medications to each visit for review by your provider.  Your lab results, notes, and after visit summary will be available on My Chart. We strongly encourage you to use this feature. If lab results are abnormal the clinic will contact you with the appropriate steps. If the clinic does not contact you assume the results are satisfactory. You can always view your results on My Chart. If you have questions regarding your health or results, please contact the clinic during office hours. You can also ask questions on My Chart.  We at [facility] are grateful that you chose us to provide your care. We strive to provide evidence-based and compassionate care and are always looking for feedback. If you get a survey from the clinic please complete this so we can hear your opinions.  Diabetes Mellitus Basics  Diabetes mellitus, or diabetes, is a long-term (chronic) disease. It occurs when the body does not properly use sugar (glucose) that is released from food after you eat. Diabetes mellitus may be caused by one or both of these problems: Your pancreas does not make enough of a hormone called insulin. Your body does not react in a normal way to the insulin that it makes. Insulin lets glucose enter cells in your body. This gives you energy. If you have diabetes, glucose cannot get into cells. This causes high blood glucose (hyperglycemia). How to treat and manage diabetes You may need to take insulin or other diabetes medicines daily to keep your glucose in balance. If you are prescribed insulin, you will learn how to give  yourself insulin by injection. You may need to adjust the amount of insulin you take based on the foods that you eat. You will need to check your blood glucose levels using a glucose monitor as told by your health care provider. The readings can help determine if you have low or high blood glucose. Generally, you should have these blood glucose levels: Before meals (preprandial): 80-130 mg/dL (4.4-7.2 mmol/L). After meals (postprandial): below 180 mg/dL (10 mmol/L). Hemoglobin A1c (HbA1c) level: less than 7%. Your health care provider will set treatment goals for you. Keep all follow-up visits. This is important. Follow these instructions at home: Diabetes medicines Take your diabetes medicines every day as told by your health care provider. List your diabetes medicines here: Name of medicine: ______________________________ Amount (dose): _______________ Time (a.m./p.m.): _______________ Notes: ___________________________________ Name of medicine: ______________________________ Amount (dose): _______________ Time (a.m./p.m.): _______________ Notes: ___________________________________ Name of medicine: ______________________________ Amount (dose): _______________ Time (a.m./p.m.): _______________ Notes: ___________________________________ Insulin If you use insulin, list the types of insulin you use here: Insulin type: ______________________________ Amount (dose): _______________ Time (a.m./p.m.): _______________Notes: ___________________________________ Insulin type: ______________________________ Amount (dose): _______________ Time (a.m./p.m.): _______________ Notes: ___________________________________ Insulin type: ______________________________ Amount (dose): _______________ Time (a.m./p.m.): _______________ Notes: ___________________________________ Insulin type: ______________________________ Amount (dose): _______________ Time (a.m./p.m.): _______________ Notes:  ___________________________________ Insulin type: ______________________________ Amount (dose): _______________ Time (a.m./p.m.): _______________ Notes: ___________________________________ Managing blood glucose  Check your blood glucose levels using a glucose monitor as told by your health care provider. Write down the times that you check your glucose levels here: Time: _______________ Notes: ___________________________________   Time: _______________ Notes: ___________________________________ Time: _______________ Notes: ___________________________________ Time: _______________ Notes: ___________________________________ Time: _______________ Notes: ___________________________________ Time: _______________ Notes: ___________________________________  Low blood glucose Low blood glucose (hypoglycemia) is when glucose is at or below 70 mg/dL (3.9 mmol/L). Symptoms may include: Feeling: Hungry. Sweaty and clammy. Irritable or easily upset. Dizzy. Sleepy. Having: A fast heartbeat. A headache. A change in your vision. Numbness around the mouth, lips, or tongue. Having trouble with: Moving (coordination). Sleeping. Treating low blood glucose To treat low blood glucose, eat or drink something containing sugar right away. If you can think clearly and swallow safely, follow the 15:15 rule: Take 15 grams of a fast-acting carb (carbohydrate), as told by your health care provider. Some fast-acting carbs are: Glucose tablets: take 3-4 tablets. Hard candy: eat 3-5 pieces. Fruit juice: drink 4 oz (120 mL). Regular (not diet) soda: drink 4-6 oz (120-180 mL). Honey or sugar: eat 1 Tbsp (15 mL). Check your blood glucose levels 15 minutes after you take the carb. If your glucose is still at or below 70 mg/dL (3.9 mmol/L), take 15 grams of a carb again. If your glucose does not go above 70 mg/dL (3.9 mmol/L) after 3 tries, get help right away. After your glucose goes back to normal, eat a meal  or a snack within 1 hour. Treating very low blood glucose If your glucose is at or below 54 mg/dL (3 mmol/L), you have very low blood glucose (severe hypoglycemia). This is an emergency. Do not wait to see if the symptoms will go away. Get medical help right away. Call your local emergency services (911 in the [place] Do not drive yourself to the hospital. Questions to ask your health care provider Should I talk with a diabetes educator? What equipment will I need to care for myself at home? What diabetes medicines do I need? When should I take them? How often do I need to check my blood glucose levels? What number can I call if I have questions? When is my follow-up visit? Where can I find a support group for people with diabetes? Where to find more information American Diabetes Association: www.diabetes.org Association of Diabetes Care and Education Specialists: www.diabeteseducator.org Contact a health care provider if: Your blood glucose is at or above 240 mg/dL (13.3 mmol/L) for 2 days in a row. You have been sick or have had a fever for 2 days or more, and you are not getting better. You have any of these problems for more than 6 hours: You cannot eat or drink. You feel nauseous. You vomit. You have diarrhea. Get help right away if: Your blood glucose is lower than 54 mg/dL (3 mmol/L). You get confused. You have trouble thinking clearly. You have trouble breathing. These symptoms may represent a serious problem that is an emergency. Do not wait to see if the symptoms will go away. Get medical help right away. Call your local emergency services (911 in the [place] Do not drive yourself to the hospital. Summary Diabetes mellitus is a chronic disease that occurs when the body does not properly use sugar (glucose) that is released from food after you eat. Take insulin and diabetes medicines as told. Check your blood glucose every day, as often as told. Keep all follow-up visits. This  is important. This information is not intended to replace advice given to you by your health care provider. Make sure you discuss any questions you have with your health care provider. Document Revised: 06/25/2019 Document Reviewed: 06/25/2019 Elsevier Patient Education    2024 Elsevier Inc.  

## 2022-10-07 ENCOUNTER — Ambulatory Visit: Payer: Medicare Other | Admitting: Nurse Practitioner

## 2022-10-10 ENCOUNTER — Other Ambulatory Visit: Payer: Self-pay | Admitting: Nurse Practitioner

## 2022-10-10 NOTE — Telephone Encounter (Signed)
Medication Refill - Medication: telmisartan (MICARDIS) 80 MG tablet   Has the patient contacted their pharmacy? No.   Preferred Pharmacy (with phone number or street name):  [id number] - [place], [place] - [address] Phone: [phone]  Fax: [phone]     Has the patient been seen for an appointment in the last year OR does the patient have an upcoming appointment? Yes.    Agent: Please be advised that RX refills may take up to 3 business days. We ask that you follow-up with your pharmacy.

## 2022-10-10 NOTE — Telephone Encounter (Signed)
Medication Refill - Medication: empagliflozin (JARDIANCE) 25 MG TABS tablet   Has the patient contacted their pharmacy? Yes.   No refills  Preferred Pharmacy (with phone number or street name):  [facility] - [place], [place] - [address] [address], [place] [address] Phone: [phone]  Fax: [phone]     Has the patient been seen for an appointment in the last year OR does the patient have an upcoming appointment? Yes.    Agent: Please be advised that RX refills may take up to 3 business days. We ask that you follow-up with your pharmacy.  Patient called stated she gets her meds thru the mail and she is on a special program where she get it free. Patient only has 2 pills left. Please f/u with patient

## 2022-10-11 ENCOUNTER — Ambulatory Visit (INDEPENDENT_AMBULATORY_CARE_PROVIDER_SITE_OTHER): Payer: Medicare Other | Admitting: Nurse Practitioner

## 2022-10-11 ENCOUNTER — Encounter: Payer: Self-pay | Admitting: Nurse Practitioner

## 2022-10-11 VITALS — BP 136/82 | HR 79 | Temp 97.6°F | Ht 64.0 in | Wt 158.8 lb

## 2022-10-11 DIAGNOSIS — E1159 Type 2 diabetes mellitus with other circulatory complications: Secondary | ICD-10-CM

## 2022-10-11 DIAGNOSIS — E1169 Type 2 diabetes mellitus with other specified complication: Secondary | ICD-10-CM | POA: Diagnosis not present

## 2022-10-11 DIAGNOSIS — I152 Hypertension secondary to endocrine disorders: Secondary | ICD-10-CM

## 2022-10-11 DIAGNOSIS — Z794 Long term (current) use of insulin: Secondary | ICD-10-CM | POA: Diagnosis not present

## 2022-10-11 DIAGNOSIS — M85852 Other specified disorders of bone density and structure, left thigh: Secondary | ICD-10-CM | POA: Diagnosis not present

## 2022-10-11 DIAGNOSIS — E1142 Type 2 diabetes mellitus with diabetic polyneuropathy: Secondary | ICD-10-CM | POA: Diagnosis not present

## 2022-10-11 DIAGNOSIS — E669 Obesity, unspecified: Secondary | ICD-10-CM

## 2022-10-11 DIAGNOSIS — E785 Hyperlipidemia, unspecified: Secondary | ICD-10-CM

## 2022-10-11 DIAGNOSIS — F5104 Psychophysiologic insomnia: Secondary | ICD-10-CM

## 2022-10-11 LAB — BAYER DCA HB A1C WAIVED: HB A1C (BAYER DCA - WAIVED): 6 % — ABNORMAL HIGH (ref 4.8–5.6)

## 2022-10-11 NOTE — Telephone Encounter (Signed)
Unable to refill per protocol, Rx expired. Discontinued 01/14/22.  Requested Prescriptions  Pending Prescriptions Disp Refills   OZEMPIC, 1 MG/DOSE, 4 MG/3ML SOPN [Pharmacy Med Name: OZEMPIC 4 MG/3 ML (1 MG/DOSE)]  4    Sig: INJECT 1 MG UNDER SKIN WEEKLY     Endocrinology:  Diabetes - GLP-1 Receptor Agonists - semaglutide Failed - 10/10/2022  9:42 AM      Failed - HBA1C in normal range and within 180 days    Hemoglobin A1C  Date Value Ref Range Status  10/27/2015 7.3%  Final   HB A1C (BAYER DCA - WAIVED)  Date Value Ref Range Status  07/06/2022 8.6 (H) 4.8 - 5.6 % Final    Comment:             Prediabetes: 5.7 - 6.4          Diabetes: >6.4          Glycemic control for adults with diabetes: <7.0          Failed - Valid encounter within last 6 months    Recent Outpatient Visits           3 months ago Medicare welcome visit   [place] [facility] [place], [place]cock T, NP   7 months ago Type 2 diabetes mellitus with obesity (HCC)   [place] [facility] [place], [place] T, NP   9 months ago Type 2 diabetes mellitus with obesity (HCC)   [place] [facility] [place], [place] T, NP   12 months ago Type 2 diabetes mellitus with obesity (HCC)   [place] [facility] [place], Corrie Dandy T, NP   1 year ago Type 2 diabetes mellitus with obesity (HCC)   [place] [facility] [place], Dorie Rank, NP       Future Appointments             Today Marjie Skiff, NP  [facility]            Passed - Cr in normal range and within 360 days    Creatinine, Ser  Date Value Ref Range Status  07/06/2022 0.74 0.57 - 1.00 mg/dL Final

## 2022-10-11 NOTE — Assessment & Plan Note (Addendum)
Chronic, ongoing.  Continue current medication regimen and adjust as needed based on labs. Lipid panel up to date. ?

## 2022-10-11 NOTE — Assessment & Plan Note (Signed)
Refer to diabetes plan of care. 

## 2022-10-11 NOTE — Assessment & Plan Note (Signed)
Diagnosed 06/27/22.  Educated on this.  Recommend adequate calcium intake daily and Vitamin D3 2000 units daily + focus on weight bearing exercised for strengthening and fall prevention.

## 2022-10-11 NOTE — Assessment & Plan Note (Signed)
Chronic, stable.  BP trending down on recheck. Continue current medication regimen at this time and adjust as needed.  Telmisartan offering kidney protection.  Recommend she check BP at least 3 times a week at home + focus on DASH diet.  Continue to monitor kidney function and if GFR < 45 consider reduction Metformin.  Urine ALB 150 May 2024.  LABS: up to date.  If elevations BP in future increase Amlodipine to 10 MG.

## 2022-10-11 NOTE — Assessment & Plan Note (Signed)
Due to grieving process, stable at this time.  Will continue current medication regimen and adjust as needed.  At present Trazodone beneficial both to mood and sleep pattern.  Denies any SI/HI.  Recommend she continue current support group sittings.

## 2022-10-11 NOTE — Progress Notes (Signed)
BP 136/82 (BP Location: Left Arm, Patient Position: Sitting, Cuff Size: Normal)   Pulse 79   Temp 97.6 F (36.4 C) (Oral)   Ht 5\' 4"  (1.626 m)   Wt 158 lb 12.8 oz (72 kg)   LMP  (LMP Unknown)   SpO2 97%   BMI 27.26 kg/m    Subjective:    Patient ID: [name], female    DOB: 1955-07-25, 67 y.o.   MRN: [id number]  HPI: SHYLEEN RIEKE is a 67 y.o. female  Chief Complaint  Patient presents with   Depression   Hyperlipidemia   Hypertension   Osteopenia   DIABETES A1c 8.6% in May.  Taking Tresiba 30 units every other night (has done this since restarting Ozempic and cut back on her insulin with this), Metformin 1000 MG BID, Ozempic 2 MG, and Jardiance 25 MG.  Has lost 25 pounds since restarting Ozempic.  Has been watching her diet closely.  Gets Jardiance and Ozempic via assistance.  Takes Gabapentin 300 MG at night for discomfort neuropathy, 600 MG made her to groggy.  Is taking Trazodone at night for sleep -- has been taking this since loss of her husband over one year ago. Hypoglycemic episodes:no Polydipsia/polyuria: no Visual disturbance: no Chest pain: no Paresthesias: no Glucose Monitoring: yes             Accucheck frequency: daily             Fasting glucose: 103 last check             Post prandial:             Evening: 135 to 140             Before meals: Taking Insulin?: yes             Long acting insulin: 30 units every other day             Short acting insulin: Blood Pressure Monitoring: occasionally Retinal Examination: Up To Date Foot Exam: Up to Date Pneumovax: Up to Date Influenza: Up to Date Aspirin: yes    HYPERTENSION / HYPERLIPIDEMIA  Continues on Amlodipine, ASA, Lipitor, Metoprolol, and Micardis.   Satisfied with current treatment? yes Duration of hypertension: chronic BP monitoring frequency: not checking BP range:  BP medication side effects: no Duration of hyperlipidemia: chronic Cholesterol medication side effects:  no Cholesterol supplements: none Medication compliance: good compliance Aspirin: yes Recent stressors: no Recurrent headaches: no Visual changes: no Palpitations: no Dyspnea: no Chest pain: no Lower extremity edema: no Dizzy/lightheaded: no  OSTEOPENIA Noted on DEXA scan 06/27/22 osteopenia.  Satisfied with current treatment?: yes Medication compliance: good compliance Adequate calcium & vitamin D: yes Weight bearing exercises: yes   Relevant past medical, surgical, family and social history reviewed and updated as indicated. Interim medical history since our last visit reviewed. Allergies and medications reviewed and updated.  Review of Systems  Constitutional:  Negative for activity change, appetite change, diaphoresis, fatigue and fever.  Respiratory:  Negative for cough, chest tightness and shortness of breath.   Cardiovascular:  Negative for chest pain, palpitations and leg swelling.  Gastrointestinal: Negative.   Endocrine: Negative for polydipsia, polyphagia and polyuria.  Neurological: Negative.   Psychiatric/Behavioral: Negative.     Per HPI unless specifically indicated above     Objective:    BP 136/82 (BP Location: Left Arm, Patient Position: Sitting, Cuff Size: Normal)   Pulse 79   Temp 97.6 F (36.4 C) (  Oral)   Ht 5\' 4"  (1.626 m)   Wt 158 lb 12.8 oz (72 kg)   LMP  (LMP Unknown)   SpO2 97%   BMI 27.26 kg/m   Wt Readings from Last 3 Encounters:  10/11/22 158 lb 12.8 oz (72 kg)  07/06/22 183 lb 1.6 oz (83.1 kg)  02/14/22 189 lb (85.7 kg)    Physical Exam Vitals and nursing note reviewed.  Constitutional:      General: She is awake. She is not in acute distress.    Appearance: She is well-developed and well-groomed. She is obese. She is not ill-appearing.  HENT:     Head: Normocephalic.     Right Ear: Hearing normal.     Left Ear: Hearing normal.  Eyes:     General: Lids are normal.        Right eye: No discharge.        Left eye: No discharge.      Conjunctiva/sclera: Conjunctivae normal.     Pupils: Pupils are equal, round, and reactive to light.  Neck:     Thyroid: No thyromegaly.     Vascular: No carotid bruit.  Cardiovascular:     Rate and Rhythm: Normal rate and regular rhythm.     Heart sounds: Normal heart sounds. No murmur heard.    No gallop.  Pulmonary:     Effort: Pulmonary effort is normal.     Breath sounds: Normal breath sounds.  Abdominal:     General: Bowel sounds are normal.     Palpations: Abdomen is soft.  Musculoskeletal:     Cervical back: Normal range of motion and neck supple.     Right lower leg: No edema.     Left lower leg: No edema.  Skin:    General: Skin is warm and dry.  Neurological:     Mental Status: She is alert and oriented to person, place, and time.  Psychiatric:        Attention and Perception: Attention normal.        Mood and Affect: Mood normal.        Speech: Speech normal.        Behavior: Behavior normal. Behavior is cooperative.        Thought Content: Thought content normal.    Results for orders placed or performed in visit on 07/14/22  HM DIABETES EYE EXAM  Result Value Ref Range   HM Diabetic Eye Exam No Retinopathy No Retinopathy      Assessment & Plan:   Problem List Items Addressed This Visit       Cardiovascular and Mediastinum   Hypertension associated with diabetes (HCC)    Chronic, stable.  BP trending down on recheck. Continue current medication regimen at this time and adjust as needed.  Telmisartan offering kidney protection.  Recommend she check BP at least 3 times a week at home + focus on DASH diet.  Continue to monitor kidney function and if GFR < 45 consider reduction Metformin.  Urine ALB 150 May 2024.  LABS: up to date.  If elevations BP in future increase Amlodipine to 10 MG.      Relevant Orders   Bayer DCA Hb A1c Waived     Endocrine   Diabetic peripheral neuropathy (HCC)    Chronic, ongoing with A1c trending down to 6%, previous 8.6%,  with restarting Ozempic and Jardiance.  She is working with pharmacy team on this -- assistance on [place].  Continue Metformin  1000 MG BID, Ozempic, and Jardiance.  Recommend she reduce Tresiba to 27 units and then continue to reduce every 3 days by 3 units if fasting BS <130.  Goal to discontinue insulin in future.  Educated her on medications and side effects.  Urine ALB 150 May 2024, keep Telmisartan on board for protection.  Return in 3 months. - Eye exam up to date, foot exam up to date. - Statin and ARB on board - Vaccinations up to date      Relevant Orders   Bayer DCA Hb A1c Waived   Hyperlipidemia associated with type 2 diabetes mellitus (HCC)    Chronic, ongoing.  Continue current medication regimen and adjust as needed based on labs. Lipid panel up to date.      Relevant Orders   Bayer DCA Hb A1c Waived   Type 2 diabetes mellitus with obesity (HCC) - Primary    Chronic, ongoing with A1c trending down to 6%, previous 8.6%, with restarting Ozempic and Jardiance.  She is working with pharmacy team on this -- assistance on [place].  Continue Metformin 1000 MG BID, Ozempic, and Jardiance.  Recommend she reduce Tresiba to 27 units and then continue to reduce every 3 days by 3 units if fasting BS <130.  Goal to discontinue insulin in future.  Educated her on medications and side effects.  Urine ALB 150 May 2024, keep Telmisartan on board for protection.  Return in 3 months. - Eye exam up to date, foot exam up to date. - Statin and ARB on board - Vaccinations up to date      Relevant Orders   Bayer DCA Hb A1c Waived     Musculoskeletal and Integument   Osteopenia of neck of left femur    Diagnosed 06/27/22.  Educated on this.  Recommend adequate calcium intake daily and Vitamin D3 2000 units daily + focus on weight bearing exercised for strengthening and fall prevention.        Other   Insomnia    Due to grieving process, stable at this time.  Will  continue current medication regimen and adjust as needed.  At present Trazodone beneficial both to mood and sleep pattern.  Denies any SI/HI.  Recommend she continue current support group sittings.        Insulin long-term use (HCC)    Refer to diabetes plan of care.        Follow up plan: Return in about 3 months (around 01/11/2023) for T2DM, HTN/HLD, GERD.

## 2022-10-11 NOTE — Assessment & Plan Note (Signed)
Chronic, ongoing with A1c trending down to 6%, previous 8.6%, with restarting Ozempic and Jardiance.  She is working with pharmacy team on this -- assistance on [place].  Continue Metformin 1000 MG BID, Ozempic, and Jardiance.  Recommend she reduce Tresiba to 27 units and then continue to reduce every 3 days by 3 units if fasting BS <130.  Goal to discontinue insulin in future.  Educated her on medications and side effects.  Urine ALB 150 May 2024, keep Telmisartan on board for protection.  Return in 3 months. - Eye exam up to date, foot exam up to date. - Statin and ARB on board - Vaccinations up to date

## 2022-10-11 NOTE — Telephone Encounter (Signed)
Requested medication (s) are due for refill today: Yes  Requested medication (s) are on the active medication list: Yes  Last refill:  07/22/22  Future visit scheduled: Yes  Notes to clinic:  Unable to refill per protocol, last refill by another provider.      Requested Prescriptions  Pending Prescriptions Disp Refills   empagliflozin (JARDIANCE) 25 MG TABS tablet 30 tablet     Sig: Take 1 tablet (25 mg total) by mouth daily.     Endocrinology:  Diabetes - SGLT2 Inhibitors Failed - 10/10/2022 10:11 AM      Failed - HBA1C is between 0 and 7.9 and within 180 days    Hemoglobin A1C  Date Value Ref Range Status  10/27/2015 7.3%  Final   HB A1C (BAYER DCA - WAIVED)  Date Value Ref Range Status  07/06/2022 8.6 (H) 4.8 - 5.6 % Final    Comment:             Prediabetes: 5.7 - 6.4          Diabetes: >6.4          Glycemic control for adults with diabetes: <7.0          Failed - Valid encounter within last 6 months    Recent Outpatient Visits           3 months ago Medicare welcome visit   [place] [facility] [place], [place] T, NP   7 months ago Type 2 diabetes mellitus with obesity (HCC)   [place] [facility] [place], [place] T, NP   9 months ago Type 2 diabetes mellitus with obesity (HCC)   [place] [facility] [place], Ethan T, NP   12 months ago Type 2 diabetes mellitus with obesity (HCC)   [place] [facility] [place], [place] T, NP   1 year ago Type 2 diabetes mellitus with obesity (HCC)   [place] [facility] [place], [place] T, NP       Future Appointments             Today Marjie Skiff, NP [place] [facility]            Passed - Cr in normal range and within 360 days    Creatinine, Ser  Date Value Ref Range Status  07/06/2022 0.74 0.57 - 1.00 mg/dL Final         Passed - eGFR in normal range and within 360 days    GFR calc Af Amer  Date Value Ref  Range Status  04/01/2020 95 >59 mL/min/1.73 Final    Comment:    **In accordance with recommendations from the NKF-ASN Task force,**   [facility] is in the process of updating its eGFR calculation to the   2021 CKD-EPI creatinine equation that estimates kidney function   without a race variable.    GFR calc non Af Amer  Date Value Ref Range Status  04/01/2020 82 >59 mL/min/1.73 Final   eGFR  Date Value Ref Range Status  07/06/2022 90 >59 mL/min/1.73 Final

## 2022-10-11 NOTE — Telephone Encounter (Signed)
Too Soon, Filled 07/06/22 #90 4RF Requested Prescriptions  Pending Prescriptions Disp Refills   telmisartan (MICARDIS) 80 MG tablet 90 tablet 4    Sig: Take 1 tablet (80 mg total) by mouth daily.     Cardiovascular:  Angiotensin Receptor Blockers Failed - 10/10/2022 10:00 AM      Failed - Valid encounter within last 6 months    Recent Outpatient Visits           3 months ago Medicare welcome visit   [facility] [place], [place] T, NP   7 months ago Type 2 diabetes mellitus with obesity (HCC)   [place] [facility] [place], Jeannette T, NP   9 months ago Type 2 diabetes mellitus with obesity (HCC)   [place] [facility] [place], [place] T, NP   12 months ago Type 2 diabetes mellitus with obesity (HCC)   [place] [facility] [place], Corrie Dandy T, NP   1 year ago Type 2 diabetes mellitus with obesity (HCC)   [place] [facility] [place], [place] T, NP       Future Appointments             Today Marjie Skiff, NP [place] [facility]            Passed - Cr in normal range and within 180 days    Creatinine, Ser  Date Value Ref Range Status  07/06/2022 0.74 0.57 - 1.00 mg/dL Final         Passed - K in normal range and within 180 days    Potassium  Date Value Ref Range Status  07/06/2022 4.4 3.5 - 5.2 mmol/L Final         Passed - Patient is not pregnant      Passed - Last BP in normal range    BP Readings from Last 1 Encounters:  07/06/22 128/70

## 2022-10-12 ENCOUNTER — Telehealth: Payer: Self-pay

## 2022-10-12 ENCOUNTER — Telehealth: Payer: Self-pay | Admitting: Nurse Practitioner

## 2022-10-12 NOTE — Telephone Encounter (Signed)
Medication Refill - Medication: empagliflozin (JARDIANCE) 25 MG TABS tablet   Pt stated she took her last tablet today and she needs a refill. She stated she gets it from [facility]; it was previously mailed to her. This was set up through the office and does not pay for medication; however, she hasn't received a refill.  Please advise.  Has the patient contacted their pharmacy? No.  (Agent: If yes, when and what did the pharmacy advise?)  Preferred Pharmacy (with phone number or street name): [facility]  Has the patient been seen for an appointment in the last year OR does the patient have an upcoming appointment? Yes.    Agent: Please be advised that RX refills may take up to 3 business days. We ask that you follow-up with your pharmacy.

## 2022-10-12 NOTE — Progress Notes (Signed)
   10/12/2022  [name], female   DOB: Jul 06, 1955, 67 y.o.   MRN: [id number]  Received a clinic routed request from [facility] stating April Johnson took her last tablet of Jardiance 25mg  today and has not yet received a refill from [facility] patient assistance.  Contacted the program, and the patient needs to request a refill each time one is needed.  Unfortunately, that program does not do automatic refills.  A refill is now processing, and they state the typical turn around time is 5-7 days.  The patient can call tomorrow to request the order be expedited, but this is only allowed once a year; and with the weekend approaching, it may not be the best use of the 1 time expediting.  Sending April Johnson a MyChart message to notify.  Lenna Gilford, PharmD, DPLA

## 2022-10-17 ENCOUNTER — Telehealth: Payer: Self-pay

## 2022-10-17 NOTE — Telephone Encounter (Signed)
1 bottle of Jardiance received for the patient.  Called and notified patient that medication was ready to be picked up.

## 2022-10-18 NOTE — Telephone Encounter (Signed)
Medication picked up by the patient.  

## 2022-10-24 ENCOUNTER — Other Ambulatory Visit: Payer: Self-pay

## 2022-10-24 ENCOUNTER — Other Ambulatory Visit: Payer: Medicare Other

## 2022-10-24 NOTE — Patient Outreach (Signed)
Care Management   Visit Note  10/24/2022 Name: April Johnson MRN: [id number] DOB: [birth year]  Subjective: April Johnson is a 67 y.o. year old female who is a primary care patient of Cannady, Dorie Rank, NP. The Care Management team was consulted for assistance.      Engaged with patient spoke with patient by telephone.    Goals Addressed             This Visit's Progress    RNCM Care Management Expected Outcome:  Monitor, Self-Manage and Reduce Symptoms of Diabetes       Current Barriers:  Care Coordination needs related to cost constraints related to medications she is prescribed for management of blood sugars and keeping A1C in goal in a patient with DM Chronic Disease Management support and education needs related to effective management of DM Financial Constraints.  Difficulty obtaining medications  Lab Results  Component Value Date   HGBA1C 6.0 (H) 10/11/2022   The patient states when she is on Ozempic her A1C is down to 5.7 Previous earlier this year 8.6  Planned Interventions: Provided education to patient about basic DM disease process. The patient has gotten her Ozempic. Her blood sugars has been doing good. She feels like her DM are better controlled. She is happy to have her Ozempic back. The patient saw the pcp earlier in the month and her Alc was down to 6.0. she is pleased with the outcome. She states she has been working hard and has not been eating pastas and breads. She feels like she is doing good at managing her DM and has lost 25 pounds also. She says she feels a lot better. Is having some issues when she gets hot and she feels nauseated but she goes inside or gets in her pool and that helps considerably. Reviewed medications with patient and discussed importance of medication adherence.The patient has her Ozempic and is happy to have it now.  She is getting assistance with medications cost and this is helping her a lot. She works with the pharm D on a  regular basis. Knows to call for any changes or new needs. ;        Reviewed prescribed diet with patient heart healthy/ADA diet. States she has cut out pastas and breads. She is mindful of her dietary intake. Education and support given. She has been having elevated blood sugars.  Counseled on importance of regular laboratory monitoring as prescribed. Has regular lab work done. Labs are up to date.;        Discussed plans with patient for ongoing care management follow up and provided patient with direct contact information for care management team;      Provided patient with written educational materials related to hypo and hyperglycemia and importance of correct treatment; The patient states that she has been having good blood sugars. Denies any extreme lows or highs   Reviewed scheduled/upcoming provider appointments including: 01-11-2023 at 1120 am     Advised patient, providing education and rationale, to check cbg when you have symptoms of low or high blood sugar and as directed   and record. Her blood sugars have been around 105 to 115 in the mornings.  She has lost about 25 pounds too. She is feeling great.  Education on the goal of fasting <130 and post prandial of <180.  The patient wants to get her blood sugars down.    call provider for findings outside established parameters;  Referral made to pharmacy team for assistance with cost constraints related to Ozempic. The patient is working with the pharm D on a regular basis has gotten approved for [employer] and help with [place]. She is thankful for the support she gets from the team.  Review of patient status, including review of consultants reports, relevant laboratory and other test results, and medications completed;       Advised patient to discuss if there are samples available in the office at her upcoming office visit, questions and concerns about her DM with provider. Wants to talk to the pcp about a continuous glucose meter;       Screening for signs and symptoms of depression related to chronic disease state;        Assessed social determinant of health barriers;      Supplied education on the [place] program at [email or web address] for help with grief support after the loss of her husband, Toney Sang last year in June. The patient is thankful she has a good support system from her family and friends. Reflective listening and support given.   The patient states that her husbands death anniversary is the 23rd and it will be 2 years and their wedding anniversary would have been 39 years on the 28th. The patient is doing okay but knows that this is going to be hard days. Reflective listening and support given. Will continue to monitor for changes and needs. The patient states that she has lost weight and sometimes she sweats under her abdominal folds. She states that it gets yeast and has a smell. She ask about getting some nystatin powder ordered. She has used this before. Secure message sent to the pcp and nystatin powder has been ordered. Will continue to monitor for new changes or needs.   Symptom Management: Take medications as prescribed   Attend all scheduled provider appointments Call provider office for new concerns or questions  call the Suicide and Crisis Lifeline: 988 call the [place] National Suicide Prevention Lifeline: (931)585-7498 or TTY: 325-250-9238 TTY 934 237 5377) to talk to a trained counselor call 1-800-273-TALK (toll free, 24 hour hotline) if experiencing a Mental Health or Behavioral Health Crisis  schedule appointment with eye doctor check feet daily for cuts, sores or redness trim toenails straight across manage portion size wash and dry feet carefully every day wear comfortable, cotton socks wear comfortable, well-fitting shoes  Follow Up Plan: Telephone follow up appointment with care management team member scheduled for: 12-19-2022 at 1145 am       RNCM Care Management Expected Outcome:  Monitor,  Self-Manage and Reduce Symptoms of: HLD       Current Barriers:  Chronic Disease Management support and education needs related to effective management of HLD Lab Results  Component Value Date   CHOL 94 (L) 07/06/2022   HDL 44 07/06/2022   LDLCALC 17 07/06/2022   TRIG 212 (H) 07/06/2022   CHOLHDL 3.2 01/24/2018     Planned Interventions: Provider established cholesterol goals reviewed. Review and education provided. Review of labs and goals; Counseled on importance of regular laboratory monitoring as prescribed. Has regular lab work; Provided HLD Transport planner. Review of foods that are high in cholesterol and monitoring her dietary restrictions; Reviewed role and benefits of statin for ASCVD risk reduction; Discussed strategies to manage statin-induced myalgias. Takes Lipitor 40 mg QD; Reviewed importance of limiting foods high in cholesterol. The patient is monitoring her dietary intake and trying to eat healthy. Has eliminated certain foods in her diet. The  patient denies issues with dietary restrictions. Education and support given; Reviewed exercise goals and target of 150 minutes per week; Screening for signs and symptoms of depression related to chronic disease state;  Assessed social determinant of health barriers;   Symptom Management: Take medications as prescribed   Attend all scheduled provider appointments Call provider office for new concerns or questions  call the Suicide and Crisis Lifeline: 988 call the [place] National Suicide Prevention Lifeline: 3676288032 or TTY: 412-090-3169 TTY 302-164-7345) to talk to a trained counselor call 1-800-273-TALK (toll free, 24 hour hotline) if experiencing a Mental Health or Behavioral Health Crisis  - call for medicine refill 2 or 3 days before it runs out - take all medications exactly as prescribed - call doctor with any symptoms you believe are related to your medicine - call doctor when you experience any new  symptoms - go to all doctor appointments as scheduled  Follow Up Plan: Telephone follow up appointment with care management team member scheduled for: 12-19-2022 at 1145 am       RNCM Care Management Expected Outcome:  Monitor, Self-Manage, and Reduce Symptoms of Hypertension       Current Barriers:  Chronic Disease Management support and education needs related to effective management of HTN BP Readings from Last 3 Encounters:  10/11/22 136/82  07/06/22 128/70  02/14/22 134/78     Planned Interventions: Evaluation of current treatment plan related to hypertension self management and patient's adherence to plan as established by provider. The patient has good control of HTN at this time. Denies any changes in her HTN or heart health. She states that she has a wrist cuff but it is not accurate. She needs to get another one to check her blood pressures. She was asking for resources to get a cuff. Will check with resources and see if they have any information. Provided education to patient re: stroke prevention, s/s of heart attack and stroke. The patient has a good understanding of monitoring for changes in condition that warrant emergent care for HA and stroke; Reviewed prescribed diet heart healthy/ADA diet. Review of heart healthy/ADA diet. The patient is compliant with Heart Healthy/ADA diet. The patient states that she is losing weight and happy about that since being back on Ozempic. Reviewed medications with patient and discussed importance of compliance. The patient is compliant with medications. Working with the pharm D for ongoing support and education;  Discussed plans with patient for ongoing care management follow up and provided patient with direct contact information for care management team; Advised patient, providing education and rationale, to monitor blood pressure daily and record, calling PCP for findings outside established parameters;  Reviewed scheduled/upcoming provider  appointments including: 01-11-2023 at 1120 am Advised patient to discuss changes in blood pressures or heart health with provider; Provided education on prescribed diet heart healthy/ADA diet. Review and education provided ;  Discussed complications of poorly controlled blood pressure such as heart disease, stroke, circulatory complications, vision complications, kidney impairment, sexual dysfunction;  Screening for signs and symptoms of depression related to chronic disease state;  Assessed social determinant of health barriers;   Symptom Management: Take medications as prescribed   Attend all scheduled provider appointments Call provider office for new concerns or questions  call the Suicide and Crisis Lifeline: 988 call the [place] National Suicide Prevention Lifeline: (534) 379-3376 or TTY: 502-730-2143 TTY (920) 776-3760) to talk to a trained counselor call 1-800-273-TALK (toll free, 24 hour hotline) if experiencing a Mental Health or Behavioral Health Crisis  check blood pressure weekly learn about high blood pressure keep a blood pressure log take blood pressure log to all doctor appointments call doctor for signs and symptoms of high blood pressure develop an action plan for high blood pressure keep all doctor appointments take medications for blood pressure exactly as prescribed report new symptoms to your doctor  Follow Up Plan: Telephone follow up appointment with care management team member scheduled for: 12-19-2022 at 1145 am           Consent to Services:  Patient was given information about care management services, agreed to services, and gave verbal consent to participate.   Plan: Telephone follow up appointment with care management team member scheduled for: 12-19-2022 at 1145 am  Alto Denver RN, MSN, CCM RN Care Manager  [facility]  Ambulatory Care Management  Direct Number: [phone]

## 2022-10-24 NOTE — Patient Instructions (Signed)
Visit Information  Thank you for taking time to visit with me today. Please don't hesitate to contact me if I can be of assistance to you before our next scheduled telephone appointment.  Following are the goals we discussed today:   Goals Addressed             This Visit's Progress    RNCM Care Management Expected Outcome:  Monitor, Self-Manage and Reduce Symptoms of Diabetes       Current Barriers:  Care Coordination needs related to cost constraints related to medications she is prescribed for management of blood sugars and keeping A1C in goal in a patient with DM Chronic Disease Management support and education needs related to effective management of DM Financial Constraints.  Difficulty obtaining medications  Lab Results  Component Value Date   HGBA1C 6.0 (H) 10/11/2022   The patient states when she is on Ozempic her A1C is down to 5.7 Previous earlier this year 8.6  Planned Interventions: Provided education to patient about basic DM disease process. The patient has gotten her Ozempic. Her blood sugars has been doing good. She feels like her DM are better controlled. She is happy to have her Ozempic back. The patient saw the pcp earlier in the month and her Alc was down to 6.0. she is pleased with the outcome. She states she has been working hard and has not been eating pastas and breads. She feels like she is doing good at managing her DM and has lost 25 pounds also. She says she feels a lot better. Is having some issues when she gets hot and she feels nauseated but she goes inside or gets in her pool and that helps considerably. Reviewed medications with patient and discussed importance of medication adherence.The patient has her Ozempic and is happy to have it now.  She is getting assistance with medications cost and this is helping her a lot. She works with the pharm D on a regular basis. Knows to call for any changes or new needs. ;        Reviewed prescribed diet with patient  heart healthy/ADA diet. States she has cut out pastas and breads. She is mindful of her dietary intake. Education and support given. She has been having elevated blood sugars.  Counseled on importance of regular laboratory monitoring as prescribed. Has regular lab work done. Labs are up to date.;        Discussed plans with patient for ongoing care management follow up and provided patient with direct contact information for care management team;      Provided patient with written educational materials related to hypo and hyperglycemia and importance of correct treatment; The patient states that she has been having good blood sugars. Denies any extreme lows or highs   Reviewed scheduled/upcoming provider appointments including: 01-11-2023 at 1120 am     Advised patient, providing education and rationale, to check cbg when you have symptoms of low or high blood sugar and as directed   and record. Her blood sugars have been around 105 to 115 in the mornings.  She has lost about 25 pounds too. She is feeling great.  Education on the goal of fasting <130 and post prandial of <180.  The patient wants to get her blood sugars down.    call provider for findings outside established parameters;       Referral made to pharmacy team for assistance with cost constraints related to Ozempic. The patient is working with the  pharm D on a regular basis has gotten approved for Ozempic and help with [place]. She is thankful for the support she gets from the team.  Review of patient status, including review of consultants reports, relevant laboratory and other test results, and medications completed;       Advised patient to discuss if there are samples available in the office at her upcoming office visit, questions and concerns about her DM with provider. Wants to talk to the pcp about a continuous glucose meter;      Screening for signs and symptoms of depression related to chronic disease state;        Assessed social  determinant of health barriers;      Supplied education on the [place] program at [email or web address] for help with grief support after the loss of her husband, Toney Sang last year in June. The patient is thankful she has a good support system from her family and friends. Reflective listening and support given.   The patient states that her husbands death anniversary is the 23rd and it will be 2 years and their wedding anniversary would have been 39 years on the 28th. The patient is doing okay but knows that this is going to be hard days. Reflective listening and support given. Will continue to monitor for changes and needs. The patient states that she has lost weight and sometimes she sweats under her abdominal folds. She states that it gets yeast and has a smell. She ask about getting some nystatin powder ordered. She has used this before. Secure message sent to the pcp and nystatin powder has been ordered. Will continue to monitor for new changes or needs.   Symptom Management: Take medications as prescribed   Attend all scheduled provider appointments Call provider office for new concerns or questions  call the Suicide and Crisis Lifeline: 988 call the [place] National Suicide Prevention Lifeline: 901-267-3965 or TTY: 6054518716 TTY (678)521-2101) to talk to a trained counselor call 1-800-273-TALK (toll free, 24 hour hotline) if experiencing a Mental Health or Behavioral Health Crisis  schedule appointment with eye doctor check feet daily for cuts, sores or redness trim toenails straight across manage portion size wash and dry feet carefully every day wear comfortable, cotton socks wear comfortable, well-fitting shoes  Follow Up Plan: Telephone follow up appointment with care management team member scheduled for: 12-19-2022 at 1145 am       RNCM Care Management Expected Outcome:  Monitor, Self-Manage and Reduce Symptoms of: HLD       Current Barriers:  Chronic Disease Management support and  education needs related to effective management of HLD Lab Results  Component Value Date   CHOL 94 (L) 07/06/2022   HDL 44 07/06/2022   LDLCALC 17 07/06/2022   TRIG 212 (H) 07/06/2022   CHOLHDL 3.2 01/24/2018     Planned Interventions: Provider established cholesterol goals reviewed. Review and education provided. Review of labs and goals; Counseled on importance of regular laboratory monitoring as prescribed. Has regular lab work; Provided HLD Transport planner. Review of foods that are high in cholesterol and monitoring her dietary restrictions; Reviewed role and benefits of statin for ASCVD risk reduction; Discussed strategies to manage statin-induced myalgias. Takes Lipitor 40 mg QD; Reviewed importance of limiting foods high in cholesterol. The patient is monitoring her dietary intake and trying to eat healthy. Has eliminated certain foods in her diet. The patient denies issues with dietary restrictions. Education and support given; Reviewed exercise goals and target of 150 minutes per  week; Screening for signs and symptoms of depression related to chronic disease state;  Assessed social determinant of health barriers;   Symptom Management: Take medications as prescribed   Attend all scheduled provider appointments Call provider office for new concerns or questions  call the Suicide and Crisis Lifeline: 988 call the [place] National Suicide Prevention Lifeline: 931-227-3212 or TTY: 334 712 0714 TTY [phone]) to talk to a trained counselor call 1-800-273-TALK (toll free, 24 hour hotline) if experiencing a Mental Health or Behavioral Health Crisis  - call for medicine refill 2 or 3 days before it runs out - take all medications exactly as prescribed - call doctor with any symptoms you believe are related to your medicine - call doctor when you experience any new symptoms - go to all doctor appointments as scheduled  Follow Up Plan: Telephone follow up appointment with  care management team member scheduled for: 12-19-2022 at 1145 am       RNCM Care Management Expected Outcome:  Monitor, Self-Manage, and Reduce Symptoms of Hypertension       Current Barriers:  Chronic Disease Management support and education needs related to effective management of HTN BP Readings from Last 3 Encounters:  10/11/22 136/82  07/06/22 128/70  02/14/22 134/78     Planned Interventions: Evaluation of current treatment plan related to hypertension self management and patient's adherence to plan as established by provider. The patient has good control of HTN at this time. Denies any changes in her HTN or heart health. She states that she has a wrist cuff but it is not accurate. She needs to get another one to check her blood pressures. She was asking for resources to get a cuff. Will check with resources and see if they have any information. Provided education to patient re: stroke prevention, s/s of heart attack and stroke. The patient has a good understanding of monitoring for changes in condition that warrant emergent care for HA and stroke; Reviewed prescribed diet heart healthy/ADA diet. Review of heart healthy/ADA diet. The patient is compliant with Heart Healthy/ADA diet. The patient states that she is losing weight and happy about that since being back on Ozempic. Reviewed medications with patient and discussed importance of compliance. The patient is compliant with medications. Working with the pharm D for ongoing support and education;  Discussed plans with patient for ongoing care management follow up and provided patient with direct contact information for care management team; Advised patient, providing education and rationale, to monitor blood pressure daily and record, calling PCP for findings outside established parameters;  Reviewed scheduled/upcoming provider appointments including: 01-11-2023 at 1120 am Advised patient to discuss changes in blood pressures or heart  health with provider; Provided education on prescribed diet heart healthy/ADA diet. Review and education provided ;  Discussed complications of poorly controlled blood pressure such as heart disease, stroke, circulatory complications, vision complications, kidney impairment, sexual dysfunction;  Screening for signs and symptoms of depression related to chronic disease state;  Assessed social determinant of health barriers;   Symptom Management: Take medications as prescribed   Attend all scheduled provider appointments Call provider office for new concerns or questions  call the Suicide and Crisis Lifeline: 988 call the [place] National Suicide Prevention Lifeline: 2207684812 or TTY: 9182652685 TTY 716-557-9862) to talk to a trained counselor call 1-800-273-TALK (toll free, 24 hour hotline) if experiencing a Mental Health or Behavioral Health Crisis  check blood pressure weekly learn about high blood pressure keep a blood pressure log take blood pressure log  to all doctor appointments call doctor for signs and symptoms of high blood pressure develop an action plan for high blood pressure keep all doctor appointments take medications for blood pressure exactly as prescribed report new symptoms to your doctor  Follow Up Plan: Telephone follow up appointment with care management team member scheduled for: 12-19-2022 at 1145 am           Our next appointment is by telephone on 12-19-2022 at 1145 am  Please call the care guide team at [phone] if you need to cancel or reschedule your appointment.   If you are experiencing a Mental Health or Behavioral Health Crisis or need someone to talk to, please call the Suicide and Crisis Lifeline: 988 call the [place] National Suicide Prevention Lifeline: (949)390-9081 or TTY: 213-174-3993 TTY (310)100-0156) to talk to a trained counselor call 1-800-273-TALK (toll free, 24 hour hotline)   Patient verbalizes understanding of instructions  and care plan provided today and agrees to view in MyChart. Active MyChart status and patient understanding of how to access instructions and care plan via MyChart confirmed with patient.     Telephone follow up appointment with care management team member scheduled for: 12-19-2022 at 1145 am  Alto Denver RN, MSN, CCM RN Care Manager  [facility]  Ambulatory Care Management  Direct Number: [phone]

## 2022-10-25 ENCOUNTER — Telehealth: Payer: Self-pay

## 2022-10-25 NOTE — Telephone Encounter (Signed)
4 boxes of Ozempic received for the patient. Called and notified patient that it was here and ready to be picked up.

## 2022-12-19 ENCOUNTER — Other Ambulatory Visit: Payer: Medicare Other

## 2022-12-19 ENCOUNTER — Other Ambulatory Visit: Payer: Self-pay

## 2022-12-19 NOTE — Patient Instructions (Signed)
Visit Information  Thank you for taking time to visit with me today. Please don't hesitate to contact me if I can be of assistance to you before our next scheduled telephone appointment.  Following are the goals we discussed today:   Goals Addressed             This Visit's Progress    RNCM Care Management Expected Outcome:  Monitor, Self-Manage and Reduce Symptoms of Diabetes       Current Barriers:  Care Coordination needs related to cost constraints related to medications she is prescribed for management of blood sugars and keeping A1C in goal in a patient with DM Chronic Disease Management support and education needs related to effective management of DM Financial Constraints.  Difficulty obtaining medications  Lab Results  Component Value Date   HGBA1C 6.0 (H) 10/11/2022   The patient states when she is on Ozempic her A1C is down to 5.7 Previous earlier this year 8.6  Planned Interventions: Provided education to patient about basic DM disease process. The patient has gotten her Ozempic. Her blood sugars has been doing good. She feels like her DM are better controlled. She is happy to have her Ozempic back. The patient saw the pcp earlier in the month and her Alc was down to 6.0. she is pleased with the outcome. She states she has been working hard and has not been eating pastas and breads. She feels like she is doing good at managing her DM and continues to lose weight. She has gotten down to 158 from 230. She says she feels a lot better. She likes the cooler weather. Is spending time with her sister.  Reviewed medications with patient and discussed importance of medication adherence.The patient has her Ozempic and is happy to have it now.  She is getting assistance with medications cost and this is helping her a lot. She works with the pharm D on a regular basis. Upcoming appointment on 12-22-2022 for help with Ozempic and [place]  paperwork. Knows to call for any changes or new  needs. ;        Reviewed prescribed diet with patient heart healthy/ADA diet. States she has cut out pastas and breads. She is mindful of her dietary intake. Education and support given. She has been having elevated blood sugars.  Counseled on importance of regular laboratory monitoring as prescribed. Has regular lab work done. Labs are up to date.;        Discussed plans with patient for ongoing care management follow up and provided patient with direct contact information for care management team;      Provided patient with written educational materials related to hypo and hyperglycemia and importance of correct treatment; The patient states that she has been having good blood sugars. Denies any extreme lows or highs   Reviewed scheduled/upcoming provider appointments including: 01-11-2023 at 1120 am     Advised patient, providing education and rationale, to check cbg when you have symptoms of low or high blood sugar and as directed   and record. Her blood sugars have been around 105 to 115 in the mornings. Her weight is 158, down from 230. The lowest she has seen in her blood sugars is 113. She is feeling great.  Education on the goal of fasting <130 and post prandial of <180.   call provider for findings outside established parameters;       Referral made to pharmacy team for assistance with cost constraints related to Ozempic. The  patient is working with the pharm D on a regular basis has gotten approved for [employer] and help with [place]. She is thankful for the support she gets from the team. Appointment with the pharm D on 12-22-2022. Review of patient status, including review of consultants reports, relevant laboratory and other test results, and medications completed;       Advised patient to discuss if there are samples available in the office at her upcoming office visit, questions and concerns about her DM with provider. Wants to talk to the pcp about a continuous glucose meter;      Screening  for signs and symptoms of depression related to chronic disease state;        Assessed social determinant of health barriers;      Supplied education on the [place] program at [email or web address] for help with grief support after the loss of her husband, Toney Sang last year in June. The patient is thankful she has a good support system from her family and friends. Reflective listening and support given.   The patient states that her husbands death anniversary is the 23rd and it will be 2 years and their wedding anniversary would have been 39 years on the 28th. The patient is doing okay but knows that this is going to be hard days. Reflective listening and support given. Will continue to monitor for changes and needs.   Symptom Management: Take medications as prescribed   Attend all scheduled provider appointments Call provider office for new concerns or questions  call the Suicide and Crisis Lifeline: 988 call the [place] National Suicide Prevention Lifeline: 364-291-8565 or TTY: 980-436-0722 TTY (208)011-7611) to talk to a trained counselor call 1-800-273-TALK (toll free, 24 hour hotline) if experiencing a Mental Health or Behavioral Health Crisis  schedule appointment with eye doctor check feet daily for cuts, sores or redness trim toenails straight across manage portion size wash and dry feet carefully every day wear comfortable, cotton socks wear comfortable, well-fitting shoes  Follow Up Plan: Telephone follow up appointment with care management team member scheduled for: 02-06-2023 at 1145 am       RNCM Care Management Expected Outcome:  Monitor, Self-Manage and Reduce Symptoms of: HLD       Current Barriers:  Chronic Disease Management support and education needs related to effective management of HLD Lab Results  Component Value Date   CHOL 94 (L) 07/06/2022   HDL 44 07/06/2022   LDLCALC 17 07/06/2022   TRIG 212 (H) 07/06/2022   CHOLHDL 3.2 01/24/2018     Planned  Interventions: Provider established cholesterol goals reviewed. Review and education provided. Review of labs and goals. The patient is eating better and losing weight. Denies any new issues with HLD ; Counseled on importance of regular laboratory monitoring as prescribed. Has regular lab work; Provided HLD Transport planner. Review of foods that are high in cholesterol and monitoring her dietary restrictions; Reviewed role and benefits of statin for ASCVD risk reduction; Discussed strategies to manage statin-induced myalgias. Takes Lipitor 40 mg QD; Reviewed importance of limiting foods high in cholesterol. The patient is monitoring her dietary intake and trying to eat healthy. Has eliminated certain foods in her diet. The patient denies issues with dietary restrictions. Education and support given; Reviewed exercise goals and target of 150 minutes per week; Screening for signs and symptoms of depression related to chronic disease state;  Assessed social determinant of health barriers;   Symptom Management: Take medications as prescribed   Attend all scheduled provider  appointments Call provider office for new concerns or questions  call the Suicide and Crisis Lifeline: 988 call the [place] National Suicide Prevention Lifeline: (680)070-2603 or TTY: 937 138 6487 TTY 4450937762) to talk to a trained counselor call 1-800-273-TALK (toll free, 24 hour hotline) if experiencing a Mental Health or Behavioral Health Crisis  - call for medicine refill 2 or 3 days before it runs out - take all medications exactly as prescribed - call doctor with any symptoms you believe are related to your medicine - call doctor when you experience any new symptoms - go to all doctor appointments as scheduled  Follow Up Plan: Telephone follow up appointment with care management team member scheduled for: 02-06-2023 at 1145 am       RNCM Care Management Expected Outcome:  Monitor, Self-Manage, and Reduce  Symptoms of Hypertension       Current Barriers:  Chronic Disease Management support and education needs related to effective management of HTN BP Readings from Last 3 Encounters:  10/11/22 136/82  07/06/22 128/70  02/14/22 134/78     Planned Interventions: Evaluation of current treatment plan related to hypertension self management and patient's adherence to plan as established by provider. The patient has good control of HTN at this time. Denies any changes in her HTN or heart health. She states that she has a wrist cuff but it is not accurate. She needs to get another one to check her blood pressures. She was asking for resources to get a cuff. Will check with resources and see if they have any information. Provided education to patient re: stroke prevention, s/s of heart attack and stroke. The patient has a good understanding of monitoring for changes in condition that warrant emergent care for HA and stroke; Reviewed prescribed diet heart healthy/ADA diet. Review of heart healthy/ADA diet. The patient is compliant with Heart Healthy/ADA diet. The patient states that she is losing weight and happy about that since being back on Ozempic. Reviewed medications with patient and discussed importance of compliance. The patient is compliant with medications. Working with the pharm D for ongoing support and education;  Discussed plans with patient for ongoing care management follow up and provided patient with direct contact information for care management team; Advised patient, providing education and rationale, to monitor blood pressure daily and record, calling PCP for findings outside established parameters;  Reviewed scheduled/upcoming provider appointments including: 01-11-2023 at 1120 am Advised patient to discuss changes in blood pressures or heart health with provider; Provided education on prescribed diet heart healthy/ADA diet. Review and education provided ;  Discussed complications of  poorly controlled blood pressure such as heart disease, stroke, circulatory complications, vision complications, kidney impairment, sexual dysfunction;  Screening for signs and symptoms of depression related to chronic disease state;  Assessed social determinant of health barriers;   Symptom Management: Take medications as prescribed   Attend all scheduled provider appointments Call provider office for new concerns or questions  call the Suicide and Crisis Lifeline: 988 call the [place] National Suicide Prevention Lifeline: 667-344-9715 or TTY: 925-534-2651 TTY (939)146-0546) to talk to a trained counselor call 1-800-273-TALK (toll free, 24 hour hotline) if experiencing a Mental Health or Behavioral Health Crisis  check blood pressure weekly learn about high blood pressure keep a blood pressure log take blood pressure log to all doctor appointments call doctor for signs and symptoms of high blood pressure develop an action plan for high blood pressure keep all doctor appointments take medications for blood pressure exactly as prescribed  report new symptoms to your doctor  Follow Up Plan: Telephone follow up appointment with care management team member scheduled for: 02-06-2023 at 1145 am           Our next appointment is by telephone on 02-06-2023 at 1145 am  Please call the care guide team at [phone] if you need to cancel or reschedule your appointment.   If you are experiencing a Mental Health or Behavioral Health Crisis or need someone to talk to, please call the Suicide and Crisis Lifeline: 988 call the [place] National Suicide Prevention Lifeline: 226-635-7176 or TTY: 640-637-6144 TTY 205-208-0901) to talk to a trained counselor call 1-800-273-TALK (toll free, 24 hour hotline)   Patient verbalizes understanding of instructions and care plan provided today and agrees to view in MyChart. Active MyChart status and patient understanding of how to access instructions and care  plan via MyChart confirmed with patient.     Telephone follow up appointment with care management team member scheduled for: 02-06-2023 at 1145 am  Alto Denver RN, MSN, CCM RN Care Manager  [facility]  Ambulatory Care Management  Direct Number: [phone]

## 2022-12-19 NOTE — Patient Outreach (Signed)
Care Management   Visit Note  12/19/2022 Name: April Johnson MRN: [id number] DOB: 1955/03/16  Subjective: CAHTERINE CONNEELY is a 67 y.o. year old female who is a primary care patient of Cannady, Dorie Rank, NP. The Care Management team was consulted for assistance.      Engaged with patient spoke with patient by telephone.    Goals Addressed             This Visit's Progress    RNCM Care Management Expected Outcome:  Monitor, Self-Manage and Reduce Symptoms of Diabetes       Current Barriers:  Care Coordination needs related to cost constraints related to medications she is prescribed for management of blood sugars and keeping A1C in goal in a patient with DM Chronic Disease Management support and education needs related to effective management of DM Financial Constraints.  Difficulty obtaining medications  Lab Results  Component Value Date   HGBA1C 6.0 (H) 10/11/2022   The patient states when she is on Ozempic her A1C is down to 5.7 Previous earlier this year 8.6  Planned Interventions: Provided education to patient about basic DM disease process. The patient has gotten her Ozempic. Her blood sugars has been doing good. She feels like her DM are better controlled. She is happy to have her Ozempic back. The patient saw the pcp earlier in the month and her Alc was down to 6.0. she is pleased with the outcome. She states she has been working hard and has not been eating pastas and breads. She feels like she is doing good at managing her DM and continues to lose weight. She has gotten down to 158 from 230. She says she feels a lot better. She likes the cooler weather. Is spending time with her sister.  Reviewed medications with patient and discussed importance of medication adherence.The patient has her Ozempic and is happy to have it now.  She is getting assistance with medications cost and this is helping her a lot. She works with the pharm D on a regular basis. Upcoming  appointment on 12-22-2022 for help with Ozempic and [place]  paperwork. Knows to call for any changes or new needs. ;        Reviewed prescribed diet with patient heart healthy/ADA diet. States she has cut out pastas and breads. She is mindful of her dietary intake. Education and support given. She has been having elevated blood sugars.  Counseled on importance of regular laboratory monitoring as prescribed. Has regular lab work done. Labs are up to date.;        Discussed plans with patient for ongoing care management follow up and provided patient with direct contact information for care management team;      Provided patient with written educational materials related to hypo and hyperglycemia and importance of correct treatment; The patient states that she has been having good blood sugars. Denies any extreme lows or highs   Reviewed scheduled/upcoming provider appointments including: 01-11-2023 at 1120 am     Advised patient, providing education and rationale, to check cbg when you have symptoms of low or high blood sugar and as directed   and record. Her blood sugars have been around 105 to 115 in the mornings. Her weight is 158, down from 230. The lowest she has seen in her blood sugars is 113. She is feeling great.  Education on the goal of fasting <130 and post prandial of <180.   call provider for findings outside established parameters;  Referral made to pharmacy team for assistance with cost constraints related to Ozempic. The patient is working with the pharm D on a regular basis has gotten approved for [employer] and help with [place]. She is thankful for the support she gets from the team. Appointment with the pharm D on 12-22-2022. Review of patient status, including review of consultants reports, relevant laboratory and other test results, and medications completed;       Advised patient to discuss if there are samples available in the office at her upcoming office visit, questions and  concerns about her DM with provider. Wants to talk to the pcp about a continuous glucose meter;      Screening for signs and symptoms of depression related to chronic disease state;        Assessed social determinant of health barriers;      Supplied education on the [place] program at [email or web address] for help with grief support after the loss of her husband, Toney Sang last year in June. The patient is thankful she has a good support system from her family and friends. Reflective listening and support given.   The patient states that her husbands death anniversary is the 23rd and it will be 2 years and their wedding anniversary would have been 39 years on the 28th. The patient is doing okay but knows that this is going to be hard days. Reflective listening and support given. Will continue to monitor for changes and needs.   Symptom Management: Take medications as prescribed   Attend all scheduled provider appointments Call provider office for new concerns or questions  call the Suicide and Crisis Lifeline: 988 call the [place] National Suicide Prevention Lifeline: (825)210-6177 or TTY: (706) 875-3549 TTY 7154201139) to talk to a trained counselor call 1-800-273-TALK (toll free, 24 hour hotline) if experiencing a Mental Health or Behavioral Health Crisis  schedule appointment with eye doctor check feet daily for cuts, sores or redness trim toenails straight across manage portion size wash and dry feet carefully every day wear comfortable, cotton socks wear comfortable, well-fitting shoes  Follow Up Plan: Telephone follow up appointment with care management team member scheduled for: 02-06-2023 at 1145 am       RNCM Care Management Expected Outcome:  Monitor, Self-Manage and Reduce Symptoms of: HLD       Current Barriers:  Chronic Disease Management support and education needs related to effective management of HLD Lab Results  Component Value Date   CHOL 94 (L) 07/06/2022   HDL 44 07/06/2022    LDLCALC 17 07/06/2022   TRIG 212 (H) 07/06/2022   CHOLHDL 3.2 01/24/2018     Planned Interventions: Provider established cholesterol goals reviewed. Review and education provided. Review of labs and goals. The patient is eating better and losing weight. Denies any new issues with HLD ; Counseled on importance of regular laboratory monitoring as prescribed. Has regular lab work; Provided HLD Transport planner. Review of foods that are high in cholesterol and monitoring her dietary restrictions; Reviewed role and benefits of statin for ASCVD risk reduction; Discussed strategies to manage statin-induced myalgias. Takes Lipitor 40 mg QD; Reviewed importance of limiting foods high in cholesterol. The patient is monitoring her dietary intake and trying to eat healthy. Has eliminated certain foods in her diet. The patient denies issues with dietary restrictions. Education and support given; Reviewed exercise goals and target of 150 minutes per week; Screening for signs and symptoms of depression related to chronic disease state;  Assessed social determinant of health barriers;  Symptom Management: Take medications as prescribed   Attend all scheduled provider appointments Call provider office for new concerns or questions  call the Suicide and Crisis Lifeline: 988 call the [place] National Suicide Prevention Lifeline: (518)149-8521 or TTY: 972-046-7436 TTY (587) 749-9445) to talk to a trained counselor call 1-800-273-TALK (toll free, 24 hour hotline) if experiencing a Mental Health or Behavioral Health Crisis  - call for medicine refill 2 or 3 days before it runs out - take all medications exactly as prescribed - call doctor with any symptoms you believe are related to your medicine - call doctor when you experience any new symptoms - go to all doctor appointments as scheduled  Follow Up Plan: Telephone follow up appointment with care management team member scheduled for: 02-06-2023 at 1145  am       RNCM Care Management Expected Outcome:  Monitor, Self-Manage, and Reduce Symptoms of Hypertension       Current Barriers:  Chronic Disease Management support and education needs related to effective management of HTN BP Readings from Last 3 Encounters:  10/11/22 136/82  07/06/22 128/70  02/14/22 134/78     Planned Interventions: Evaluation of current treatment plan related to hypertension self management and patient's adherence to plan as established by provider. The patient has good control of HTN at this time. Denies any changes in her HTN or heart health. She states that she has a wrist cuff but it is not accurate. She needs to get another one to check her blood pressures. She was asking for resources to get a cuff. Will check with resources and see if they have any information. Provided education to patient re: stroke prevention, s/s of heart attack and stroke. The patient has a good understanding of monitoring for changes in condition that warrant emergent care for HA and stroke; Reviewed prescribed diet heart healthy/ADA diet. Review of heart healthy/ADA diet. The patient is compliant with Heart Healthy/ADA diet. The patient states that she is losing weight and happy about that since being back on Ozempic. Reviewed medications with patient and discussed importance of compliance. The patient is compliant with medications. Working with the pharm D for ongoing support and education;  Discussed plans with patient for ongoing care management follow up and provided patient with direct contact information for care management team; Advised patient, providing education and rationale, to monitor blood pressure daily and record, calling PCP for findings outside established parameters;  Reviewed scheduled/upcoming provider appointments including: 01-11-2023 at 1120 am Advised patient to discuss changes in blood pressures or heart health with provider; Provided education on prescribed diet  heart healthy/ADA diet. Review and education provided ;  Discussed complications of poorly controlled blood pressure such as heart disease, stroke, circulatory complications, vision complications, kidney impairment, sexual dysfunction;  Screening for signs and symptoms of depression related to chronic disease state;  Assessed social determinant of health barriers;   Symptom Management: Take medications as prescribed   Attend all scheduled provider appointments Call provider office for new concerns or questions  call the Suicide and Crisis Lifeline: 988 call the [place] National Suicide Prevention Lifeline: (431)861-3132 or TTY: 802-774-8133 TTY 718-453-4338) to talk to a trained counselor call 1-800-273-TALK (toll free, 24 hour hotline) if experiencing a Mental Health or Behavioral Health Crisis  check blood pressure weekly learn about high blood pressure keep a blood pressure log take blood pressure log to all doctor appointments call doctor for signs and symptoms of high blood pressure develop an action plan for high blood pressure  keep all doctor appointments take medications for blood pressure exactly as prescribed report new symptoms to your doctor  Follow Up Plan: Telephone follow up appointment with care management team member scheduled for: 02-06-2023 at 1145 am           Consent to Services:  Patient was given information about care management services, agreed to services, and gave verbal consent to participate.   Plan: Telephone follow up appointment with care management team member scheduled for: 02-06-2023 at 1145 am  Alto Denver RN, MSN, CCM RN Care Manager  [facility]  Ambulatory Care Management  Direct Number: [phone]

## 2022-12-22 ENCOUNTER — Other Ambulatory Visit: Payer: Medicare Other

## 2022-12-22 ENCOUNTER — Other Ambulatory Visit: Payer: Self-pay

## 2022-12-22 NOTE — Progress Notes (Signed)
12/22/2022  [name], female   DOB: 08-24-1955, 67 y.o.   MRN: [id number]  S/O Patient outreach to follow-up on control of diabetes  Diabetes Management Plan -Current medications:  Ozempic 2mg  weekly ([facility] P[facility]), Jardiance 25mg  daily, metformin 1000mg  BID, Tresiba at bedtime if BG >130 -Patient has only needed [place] once in the past week -FBG 114; A1c in August 6.0% -Does endorse rare feelings of shakiness, but this is improved by eating a piece of fruit -Patient states she has continued to see weight loss and is at approximately 150lbs currently -On statin therapy with LDL at goal; on ACE/ARB for renal protection with BP moderately controlled  A/P  Diabetes Management Plan -Currently controlled -Continue current regimen, regular monitoring/recording of BG, and regular follow-up with PCP -Sees PCP 11/6 and will be due for A1c at that time  Follow-up:  3 months  Lenna Gilford, PharmD, DPLA

## 2022-12-29 ENCOUNTER — Telehealth: Payer: Self-pay

## 2022-12-29 NOTE — Progress Notes (Signed)
12/29/2022  [name], female   DOB: [birth year], 67 y.o.   MRN: [id number]  Returning missed call/voicemail from patient from earlier today.  Ms. April Johnson states she contacted her [facility] Medicare plan, and they state that April Johnson will not be covered under the 2025 benefit.  Patient does not want to switch providers.  I recommended that she call Medicare while it is currently open enrollment, as they have staff that can assist in finding plans based on providers and medications.  Number for Medicare was provided to the patient, and I did give a heads up that wait time could be lengthy to speak with someone this time of year during open enrollment.  April Johnson, PharmD, DPLA

## 2022-12-30 ENCOUNTER — Telehealth: Payer: Self-pay

## 2022-12-30 NOTE — Telephone Encounter (Signed)
4 boxes of Ozempic received for the patient. Called and notified her that it was ready to be picked up.  

## 2023-01-03 NOTE — Telephone Encounter (Signed)
Noted, will leave for [facility] review.

## 2023-01-03 NOTE — Telephone Encounter (Signed)
Patient came today to pick up medication. The 1 mg was delivered and the patient is supposed to be getting the 2 mg pens. Can we check on the patient assistance and make sure they will ship the 2 mg next time?

## 2023-01-05 ENCOUNTER — Telehealth: Payer: Self-pay | Admitting: Student-PharmD

## 2023-01-05 NOTE — Progress Notes (Signed)
Called [facility] [facility] regarding Ozempic PAP. Requested that auto refill be turned off on Ozempic 1mg , because patient is now on 2mg  weekly.  Next refill for Ozempic 2mg  will process mid-November.  Sharee Pimple

## 2023-01-05 NOTE — Progress Notes (Signed)
01/05/2023  [name], female   DOB: [birth year], 68 y.o.   MRN: [id number]  [facility] recently sent Ozempic 1mg  versus 2mg  dose patient is currently on.  Refill for Ozempic 2 should arrive at [facility] by the end of November.  Sending MyChart message to inform patient 1mg  should not be shipped again, and she can use 2 injections of 1mg  to equal 2mg  until this dose arrives.  Lenna Gilford, PharmD, DPLA

## 2023-01-07 NOTE — Patient Instructions (Incomplete)
Be Involved in Caring For Your Health:  Taking Medications When medications are taken as directed, they can greatly improve your health. But if they are not taken as prescribed, they may not work. In some cases, not taking them correctly can be harmful. To help ensure your treatment remains effective and safe, understand your medications and how to take them. Bring your medications to each visit for review by your provider.  Your lab results, notes, and after visit summary will be available on My Chart. We strongly encourage you to use this feature. If lab results are abnormal the clinic will contact you with the appropriate steps. If the clinic does not contact you assume the results are satisfactory. You can always view your results on My Chart. If you have questions regarding your health or results, please contact the clinic during office hours. You can also ask questions on My Chart.  We at [facility] are grateful that you chose [place] to provide your care. We strive to provide evidence-based and compassionate care and are always looking for feedback. If you get a survey from the clinic please complete this so we can hear your opinions.  Diabetes Mellitus and Exercise Regular exercise is important for your health, especially if you have diabetes mellitus. Exercise is not just about losing weight. It can also help you increase muscle strength and bone density and reduce body fat and stress. This can help your level of endurance and make you more fit and flexible. Why should I exercise if I have diabetes? Exercise has many benefits for people with diabetes. It can: Help lower and control your blood sugar (glucose). Help your body respond better and become more sensitive to the hormone insulin. Reduce how much insulin your body needs. Lower your risk for heart disease by: Lowering how much "bad" cholesterol and triglycerides you have in your body. Increasing how much "good" cholesterol  you have in your body. Lowering your blood pressure. Lowering your blood glucose levels. What is my activity plan? Your health care provider or an expert trained in diabetes care (certified diabetes educator) can help you make an activity plan. This plan can help you find the type of exercise that works for you. It may also tell you how often to exercise and for how long. Be sure to: Get at least 150 minutes of medium-intensity or high-intensity exercise each week. This may involve brisk walking, biking, or water aerobics. Do stretching and strengthening exercises at least 2 times a week. This may involve yoga or weight lifting. Spread out your activity over at least 3 days of the week. Get some form of physical activity each day. Do not go more than 2 days in a row without some kind of activity. Avoid being inactive for more than 30 minutes at a time. Take frequent breaks to walk or stretch. Choose activities that you enjoy. Set goals that you know you can accomplish. Start slowly and increase the intensity of your exercise over time. How do I manage my diabetes during exercise?  Monitor your blood glucose Check your blood glucose before and after you exercise. If your blood glucose is 240 mg/dL (40.9 mmol/L) or higher before you exercise, check your urine for ketones. These are chemicals created by the liver. If you have ketones in your urine, do not exercise until your blood glucose returns to normal. If your blood glucose is 100 mg/dL (5.6 mmol/L) or lower, eat a snack that has 15-20 grams of carbohydrate in  it. Check your blood glucose 15 minutes after the snack to make sure that your level is above 100 mg/dL (5.6 mmol/L) before you start to exercise. Your risk for low blood glucose (hypoglycemia) goes up during and after exercise. Know the symptoms of this condition and how to treat it. Follow these instructions at home: Keep a carbohydrate snack on hand for use before, during, and after  exercise. This can help prevent or treat hypoglycemia. Avoid injecting insulin into parts of your body that are going to be used during exercise. This may include: Your arms, when you are going to play tennis. Your legs, when you are about to go jogging. Keep track of your exercise habits. This can help you and your health care provider watch and adjust your activity plan. Write down: What you eat before and after you exercise. Blood glucose levels before and after you exercise. The type and amount of exercise you do. Talk to your health care provider before you start a new activity. They may need to: Make sure that the activity is safe for you. Adjust your insulin, other medicines, and food that you eat. Drink water while you exercise. This can stop you from losing too much water (dehydration). It can also prevent problems caused by having a lot of heat in your body (heat stroke). Where to find more information American Diabetes Association: diabetes.org Association of Diabetes Care & Education Specialists: diabeteseducator.org This information is not intended to replace advice given to you by your health care provider. Make sure you discuss any questions you have with your health care provider. Document Revised: 08/11/2021 Document Reviewed: 08/11/2021 Elsevier Patient Education  2024 [employer].

## 2023-01-11 ENCOUNTER — Ambulatory Visit (INDEPENDENT_AMBULATORY_CARE_PROVIDER_SITE_OTHER): Payer: Medicare Other | Admitting: Nurse Practitioner

## 2023-01-11 ENCOUNTER — Encounter: Payer: Self-pay | Admitting: Nurse Practitioner

## 2023-01-11 VITALS — BP 130/76 | HR 76 | Temp 97.9°F | Ht 64.0 in | Wt 142.6 lb

## 2023-01-11 DIAGNOSIS — E1142 Type 2 diabetes mellitus with diabetic polyneuropathy: Secondary | ICD-10-CM | POA: Diagnosis not present

## 2023-01-11 DIAGNOSIS — E1159 Type 2 diabetes mellitus with other circulatory complications: Secondary | ICD-10-CM | POA: Diagnosis not present

## 2023-01-11 DIAGNOSIS — E669 Obesity, unspecified: Secondary | ICD-10-CM | POA: Diagnosis not present

## 2023-01-11 DIAGNOSIS — Z23 Encounter for immunization: Secondary | ICD-10-CM

## 2023-01-11 DIAGNOSIS — I152 Hypertension secondary to endocrine disorders: Secondary | ICD-10-CM

## 2023-01-11 DIAGNOSIS — E785 Hyperlipidemia, unspecified: Secondary | ICD-10-CM

## 2023-01-11 DIAGNOSIS — E1169 Type 2 diabetes mellitus with other specified complication: Secondary | ICD-10-CM | POA: Diagnosis not present

## 2023-01-11 DIAGNOSIS — Z794 Long term (current) use of insulin: Secondary | ICD-10-CM

## 2023-01-11 DIAGNOSIS — F5104 Psychophysiologic insomnia: Secondary | ICD-10-CM

## 2023-01-11 LAB — BAYER DCA HB A1C WAIVED: HB A1C (BAYER DCA - WAIVED): 5.5 % (ref 4.8–5.6)

## 2023-01-11 NOTE — Assessment & Plan Note (Addendum)
Chronic, ongoing with A1c trending down to 5.5%!!, previous 6%, with restarting Ozempic and Jardiance via assistance program.  Has lost 41 pounds.   Continue Metformin 1000 MG BID, Ozempic, and Jardiance.  Remain off [place]. If sugars start to trend under 70 often she is to alert PCP and cut back on Metformin, discussed with there today.  Educated her on medications and side effects.  Urine ALB 150 May 2024, keep Telmisartan on board for protection.  Return in 3 months. - Eye exam up to date, foot exam up to date. - Statin and ARB on board - Vaccinations up to date

## 2023-01-11 NOTE — Assessment & Plan Note (Addendum)
Chronic, stable.  BP trending down on recheck. Continue current medication regimen at this time and adjust as needed.  Telmisartan offering kidney protection.  Recommend she check BP at least 3 times a week at home + focus on DASH diet.  Continue to monitor kidney function and if GFR < 45 consider reduction Metformin.  Urine ALB 150 May 2024.  LABS: CMP.  If elevations BP in future increase Amlodipine to 10 MG.

## 2023-01-11 NOTE — Progress Notes (Signed)
BP 130/76 (BP Location: Left Arm)   Pulse 76   Temp 97.9 F (36.6 C) (Oral)   Ht 5\' 4"  (1.626 m)   Wt 142 lb 9.6 oz (64.7 kg)   LMP  (LMP Unknown)   SpO2 96%   BMI 24.48 kg/m    Subjective:    [name], female    DOB: 29-Sep-1955, 67 y.o.   MRN: [id number]  HPI: April Johnson is a 67 y.o. female  Chief Complaint  Patient presents with   3 month follow up     T2DM, HTN/HLD, Genella Rife, no new concerns today    DIABETES A1c 6% in August.  Taking Metformin 1000 MG BID, Ozempic 2 MG, and Jardiance 25 MG.  Has not had to take [place] in a couple weeks.  Gets Jardiance and Ozempic via assistance.  Has lost 41 pounds since May on this regimen.  Takes Gabapentin 300 MG at night for discomfort neuropathy, higher doses make her sleepy.  Is taking Trazodone at night for sleep, has been taking this since loss of her husband >1 year ago + is taking CBT. Hypoglycemic episodes:no Polydipsia/polyuria: no Visual disturbance: no Chest pain: no Paresthesias: no Glucose Monitoring: yes             Accucheck frequency: daily             Fasting glucose: 100 to 110 range             Post prandial:             Evening:              Before meals: Taking Insulin?: yes             Long acting insulin: none             Short acting insulin: Blood Pressure Monitoring: occasionally Retinal Examination: Up To Date Foot Exam: Up to Date Pneumovax: Up to Date Influenza: Up to Date Aspirin: yes    HYPERTENSION / HYPERLIPIDEMIA  Continues on Amlodipine, ASA, Lipitor, Metoprolol, and Micardis.   Satisfied with current treatment? yes Duration of hypertension: chronic BP monitoring frequency: not checking BP range:  BP medication side effects: no Duration of hyperlipidemia: chronic Cholesterol medication side effects: no Cholesterol supplements: none Medication compliance: good compliance Aspirin: yes Recent stressors: no Recurrent headaches: no Visual changes:  no Palpitations: no Dyspnea: no Chest pain: no Lower extremity edema: no Dizzy/lightheaded: no  Relevant past medical, surgical, family and social history reviewed and updated as indicated. Interim medical history since our last visit reviewed. Allergies and medications reviewed and updated.  Review of Systems  Constitutional:  Negative for activity change, appetite change, diaphoresis, fatigue and fever.  Respiratory:  Negative for cough, chest tightness and shortness of breath.   Cardiovascular:  Negative for chest pain, palpitations and leg swelling.  Gastrointestinal: Negative.   Endocrine: Negative for polydipsia, polyphagia and polyuria.  Neurological: Negative.   Psychiatric/Behavioral: Negative.     Per HPI unless specifically indicated above     Objective:    BP 130/76 (BP Location: Left Arm)   Pulse 76   Temp 97.9 F (36.6 C) (Oral)   Ht 5\' 4"  (1.626 m)   Wt 142 lb 9.6 oz (64.7 kg)   LMP  (LMP Unknown)   SpO2 96%   BMI 24.48 kg/m   Wt Readings from Last 3 Encounters:  01/11/23 142 lb 9.6 oz (64.7 kg)  10/11/22 158 lb 12.8  oz (72 kg)  07/06/22 183 lb 1.6 oz (83.1 kg)    Physical Exam Vitals and nursing note reviewed.  Constitutional:      General: She is awake. She is not in acute distress.    Appearance: She is well-developed, well-groomed and normal weight. She is not ill-appearing.  HENT:     Head: Normocephalic.     Right Ear: Hearing normal.     Left Ear: Hearing normal.  Eyes:     General: Lids are normal.        Right eye: No discharge.        Left eye: No discharge.     Conjunctiva/sclera: Conjunctivae normal.     Pupils: Pupils are equal, round, and reactive to light.  Neck:     Thyroid: No thyromegaly.     Vascular: No carotid bruit.  Cardiovascular:     Rate and Rhythm: Normal rate and regular rhythm.     Heart sounds: Normal heart sounds. No murmur heard.    No gallop.  Pulmonary:     Effort: Pulmonary effort is normal.     Breath  sounds: Normal breath sounds.  Abdominal:     General: Bowel sounds are normal.     Palpations: Abdomen is soft.  Musculoskeletal:     Cervical back: Normal range of motion and neck supple.     Right lower leg: No edema.     Left lower leg: No edema.  Skin:    General: Skin is warm and dry.  Neurological:     Mental Status: She is alert and oriented to person, place, and time.  Psychiatric:        Attention and Perception: Attention normal.        Mood and Affect: Mood normal.        Speech: Speech normal.        Behavior: Behavior normal. Behavior is cooperative.        Thought Content: Thought content normal.    Results for orders placed or performed in visit on 10/11/22  Bayer DCA Hb A1c Waived  Result Value Ref Range   HB A1C (BAYER DCA - WAIVED) 6.0 (H) 4.8 - 5.6 %      Assessment & Plan:   Problem List Items Addressed This Visit       Cardiovascular and Mediastinum   Hypertension associated with diabetes (HCC)    Chronic, stable.  BP trending down on recheck. Continue current medication regimen at this time and adjust as needed.  Telmisartan offering kidney protection.  Recommend she check BP at least 3 times a week at home + focus on DASH diet.  Continue to monitor kidney function and if GFR < 45 consider reduction Metformin.  Urine ALB 150 May 2024.  LABS: CMP.  If elevations BP in future increase Amlodipine to 10 MG.       Relevant Orders   Bayer DCA Hb A1c Waived   Comprehensive metabolic panel     Endocrine   Diabetic peripheral neuropathy (HCC)    Chronic, ongoing with A1c trending down to 5.5%!!, previous 6%, with restarting Ozempic and Jardiance via assistance program.  Continue Metformin 1000 MG BID, Ozempic, and Jardiance.  Remain off [place]. If sugars start to trend under 70 often she is to alert PCP and cut back on Metformin, discussed with there today.  Educated her on medications and side effects.  Urine ALB 150 May 2024, keep Telmisartan on board for  protection.  Return in  3 months. - Eye exam up to date, foot exam up to date. - Statin and ARB on board - Vaccinations up to date      Relevant Orders   Bayer DCA Hb A1c Waived   Hyperlipidemia associated with type 2 diabetes mellitus (HCC)    Chronic, ongoing.  Continue current medication regimen and adjust as needed based on labs. Lipid panel today.      Relevant Orders   Bayer DCA Hb A1c Waived   Comprehensive metabolic panel   Lipid Panel w/o Chol/HDL Ratio   Type 2 diabetes mellitus with obesity (HCC) - Primary    Chronic, ongoing with A1c trending down to 5.5%!!, previous 6%, with restarting Ozempic and Jardiance via assistance program.  Has lost 41 pounds.   Continue Metformin 1000 MG BID, Ozempic, and Jardiance.  Remain off [place]. If sugars start to trend under 70 often she is to alert PCP and cut back on Metformin, discussed with there today.  Educated her on medications and side effects.  Urine ALB 150 May 2024, keep Telmisartan on board for protection.  Return in 3 months. - Eye exam up to date, foot exam up to date. - Statin and ARB on board - Vaccinations up to date      Relevant Orders   Bayer DCA Hb A1c Waived     Other   Insomnia    Chronic, stable at this time.  Will continue current medication regimen and adjust as needed.  At present Trazodone beneficial both to mood and sleep pattern.  Denies any SI/HI.  Recommend she continue current support group sittings.        Insulin long-term use (HCC)    No longer on insulin with weight loss and current regimen.        Relevant Orders   Bayer DCA Hb A1c Waived   Other Visit Diagnoses     Flu vaccine need       Flu vaccine provided today, educated on this.   Relevant Orders   Flu Vaccine Trivalent High Dose (Fluad) (Completed)        Follow up plan: Return in about 3 months (around 04/13/2023) for T2DM, HTN/HLD.

## 2023-01-11 NOTE — Assessment & Plan Note (Signed)
Chronic, ongoing with A1c trending down to 5.5%!!, previous 6%, with restarting Ozempic and Jardiance via assistance program.  Continue Metformin 1000 MG BID, Ozempic, and Jardiance.  Remain off [place]. If sugars start to trend under 70 often she is to alert PCP and cut back on Metformin, discussed with there today.  Educated her on medications and side effects.  Urine ALB 150 May 2024, keep Telmisartan on board for protection.  Return in 3 months. - Eye exam up to date, foot exam up to date. - Statin and ARB on board - Vaccinations up to date

## 2023-01-11 NOTE — Assessment & Plan Note (Signed)
Chronic, stable at this time.  Will continue current medication regimen and adjust as needed.  At present Trazodone beneficial both to mood and sleep pattern.  Denies any SI/HI.  Recommend she continue current support group sittings.

## 2023-01-11 NOTE — Assessment & Plan Note (Signed)
No longer on insulin with weight loss and current regimen.

## 2023-01-11 NOTE — Assessment & Plan Note (Signed)
Chronic, ongoing.  Continue current medication regimen and adjust as needed based on labs. Lipid panel today. 

## 2023-01-12 LAB — COMPREHENSIVE METABOLIC PANEL
ALT: 10 [IU]/L (ref 0–32)
AST: 17 [IU]/L (ref 0–40)
Albumin: 4.7 g/dL (ref 3.9–4.9)
Alkaline Phosphatase: 63 [IU]/L (ref 44–121)
BUN/Creatinine Ratio: 17 (ref 12–28)
BUN: 10 mg/dL (ref 8–27)
Bilirubin Total: 0.9 mg/dL (ref 0.0–1.2)
CO2: 24 mmol/L (ref 20–29)
Calcium: 9.9 mg/dL (ref 8.7–10.3)
Chloride: 101 mmol/L (ref 96–106)
Creatinine, Ser: 0.6 mg/dL (ref 0.57–1.00)
Globulin, Total: 1.6 g/dL (ref 1.5–4.5)
Glucose: 99 mg/dL (ref 70–99)
Potassium: 4.2 mmol/L (ref 3.5–5.2)
Sodium: 139 mmol/L (ref 134–144)
Total Protein: 6.3 g/dL (ref 6.0–8.5)
eGFR: 99 mL/min/{1.73_m2} (ref >59)

## 2023-01-12 LAB — LIPID PANEL W/O CHOL/HDL RATIO
Cholesterol, Total: 102 mg/dL (ref 100–199)
HDL: 45 mg/dL (ref >39)
LDL Chol Calc (NIH): 32 mg/dL (ref 0–99)
Triglycerides: 148 mg/dL (ref 0–149)
VLDL Cholesterol Cal: 25 mg/dL (ref 5–40)

## 2023-01-12 NOTE — Progress Notes (Signed)
Contacted via MyChart   Good morning Eboni, your labs have returned.  Overall they look great.  Any questions? Keep being amazing!!  Thank you for allowing me to participate in your care.  I appreciate you. Kindest regards, Merrik Puebla

## 2023-01-13 NOTE — Progress Notes (Signed)
   01/13/2023  [name], female   DOB: [birth year], 67 y.o.   MRN: [id number]  Incoming call from [name] stating she is needing a refill on her Jardiance 25mg  that she receives through [facility].  Contacted the patient assistance company, and they have put this request into process and enrolled her in automatic refills.  Enrollment for [employer] [facility] does end the end or this year, so I am messaging patient to scheduled a time to work on PAP re-enrollments next time I am at [facility].  Lenna Gilford, PharmD, DPLA

## 2023-01-16 NOTE — Progress Notes (Signed)
   01/16/2023  [name], female   DOB: 01-29-1956, 66 y.o.   MRN: [id number]  Patient outreach to inform [name] that her Jardiance refill through PAP is in process, because she has not seen my MyChart message from Friday.  Scheduled her to come into [facility] 11/20 at 1pm, so we can work on Agricultural consultant for 2025 for [employer].  Lenna Gilford, PharmD, DPLA

## 2023-01-18 ENCOUNTER — Telehealth: Payer: Self-pay

## 2023-01-18 NOTE — Telephone Encounter (Signed)
4 boxes of Ozempic received for the patient. Called and notified patient that medication was ready to be picked up.  

## 2023-01-25 ENCOUNTER — Other Ambulatory Visit: Payer: Self-pay

## 2023-01-25 NOTE — Telephone Encounter (Signed)
Medication picked up by the patient.  

## 2023-02-06 ENCOUNTER — Other Ambulatory Visit: Payer: Medicare Other

## 2023-02-06 ENCOUNTER — Ambulatory Visit: Payer: Self-pay | Admitting: *Deleted

## 2023-02-06 ENCOUNTER — Telehealth: Payer: Self-pay | Admitting: Nurse Practitioner

## 2023-02-06 NOTE — Patient Outreach (Signed)
Care Coordination   Follow Up Visit Note   02/06/2023 Name: LAKEISHIA MALANEY MRN: [id number] DOB: [birth year]  MERIAN SCIARRA is a 67 y.o. year old female who sees Aura Dials T, NP for primary care. I spoke with  [name] by phone today.  What matters to the patients health and wellness today?  Patient report she is doing very well, all medical conditions currently managed. Denies any urgent concerns, encouraged to contact this care manager with questions.      Goals Addressed             This Visit's Progress    COMPLETED: RNCM Care Management Expected Outcome:  Monitor, Self-Manage and Reduce Symptoms of Diabetes   On track    Current Barriers:  Care Coordination needs related to cost constraints related to medications she is prescribed for management of blood sugars and keeping A1C in goal in a patient with DM Chronic Disease Management support and education needs related to effective management of DM Financial Constraints.  Difficulty obtaining medications  Lab Results  Component Value Date   HGBA1C 5.5 01/11/2023   The patient states when she is on Ozempic her A1C is down to 5.7 Previous earlier this year 8.6  Planned Interventions: Provided education to patient about basic DM disease process. The patient has gotten her Ozempic. Her blood sugars has been doing good. She feels like her DM are better controlled. She is happy to have her Ozempic back. The patient saw the pcp earlier in the month and her Alc was down to 6.0. she is pleased with the outcome. She states she has been working hard and has not been eating pastas and breads. She feels like she is doing good at managing her DM and continues to lose weight. She has gotten down to 158 from 230. She says she feels a lot better. She likes the cooler weather. Is spending time with her sister.  Reviewed medications with patient and discussed importance of medication adherence.The patient has her Ozempic  and is happy to have it now.  She is getting assistance with medications cost and this is helping her a lot. She works with the pharm D on a regular basis. Upcoming appointment on 12-22-2022 for help with Ozempic and [place]  paperwork. Knows to call for any changes or new needs. ;        Reviewed prescribed diet with patient heart healthy/ADA diet. States she has cut out pastas and breads. She is mindful of her dietary intake. Education and support given. She has been having elevated blood sugars.  Counseled on importance of regular laboratory monitoring as prescribed. Has regular lab work done. Labs are up to date.;        Discussed plans with patient for ongoing care management follow up and provided patient with direct contact information for care management team;      Provided patient with written educational materials related to hypo and hyperglycemia and importance of correct treatment; The patient states that she has been having good blood sugars. Denies any extreme lows or highs   Reviewed scheduled/upcoming provider appointments including: 01-11-2023 at 1120 am     Advised patient, providing education and rationale, to check cbg when you have symptoms of low or high blood sugar and as directed   and record. Her blood sugars have been around 105 to 115 in the mornings. Her weight is 158, down from 230. The lowest she has seen in her blood sugars is  113. She is feeling great.  Education on the goal of fasting <130 and post prandial of <180.   call provider for findings outside established parameters;       Referral made to pharmacy team for assistance with cost constraints related to Ozempic. The patient is working with the pharm D on a regular basis has gotten approved for [employer] and help with [place]. She is thankful for the support she gets from the team. Appointment with the pharm D on 12-22-2022. Review of patient status, including review of consultants reports, relevant laboratory and other  test results, and medications completed;       Advised patient to discuss if there are samples available in the office at her upcoming office visit, questions and concerns about her DM with provider. Wants to talk to the pcp about a continuous glucose meter;      Screening for signs and symptoms of depression related to chronic disease state;        Assessed social determinant of health barriers;      Supplied education on the [employer] program at [email or web address] for help with grief support after the loss of her husband, Toney Sang last year in June. The patient is thankful she has a good support system from her family and friends. Reflective listening and support given.   The patient states that her husbands death anniversary is the 23rd and it will be 2 years and their wedding anniversary would have been 39 years on the 28th. The patient is doing okay but knows that this is going to be hard days. Reflective listening and support given. Will continue to monitor for changes and needs.   Symptom Management: Take medications as prescribed   Attend all scheduled provider appointments Call provider office for new concerns or questions  call the Suicide and Crisis Lifeline: 988 call the [place] National Suicide Prevention Lifeline: (207)447-5567 or TTY: (970)501-2203 TTY (365)088-4880) to talk to a trained counselor call 1-800-273-TALK (toll free, 24 hour hotline) if experiencing a Mental Health or Behavioral Health Crisis  schedule appointment with eye doctor check feet daily for cuts, sores or redness trim toenails straight across manage portion size wash and dry feet carefully every day wear comfortable, cotton socks wear comfortable, well-fitting shoes  Follow Up Plan: Telephone follow up appointment with care management team member scheduled for: 02-06-2023 at 1145 am       COMPLETED: [facility] Care Management Expected Outcome:  Monitor, Self-Manage and Reduce Symptoms of: HLD   On track    Current  Barriers:  Chronic Disease Management support and education needs related to effective management of HLD Lab Results  Component Value Date   CHOL 102 01/11/2023   HDL 45 01/11/2023   LDLCALC 32 01/11/2023   TRIG 148 01/11/2023   CHOLHDL 3.2 01/24/2018     Planned Interventions: Provider established cholesterol goals reviewed. Review and education provided. Review of labs and goals. The patient is eating better and losing weight. Denies any new issues with HLD ; Counseled on importance of regular laboratory monitoring as prescribed. Has regular lab work; Provided HLD Transport planner. Review of foods that are high in cholesterol and monitoring her dietary restrictions; Reviewed role and benefits of statin for ASCVD risk reduction; Discussed strategies to manage statin-induced myalgias. Takes Lipitor 40 mg QD; Reviewed importance of limiting foods high in cholesterol. The patient is monitoring her dietary intake and trying to eat healthy. Has eliminated certain foods in her diet. The patient denies issues with dietary restrictions.  Education and support given; Reviewed exercise goals and target of 150 minutes per week; Screening for signs and symptoms of depression related to chronic disease state;  Assessed social determinant of health barriers;   Symptom Management: Take medications as prescribed   Attend all scheduled provider appointments Call provider office for new concerns or questions  call the Suicide and Crisis Lifeline: 988 call the [place] National Suicide Prevention Lifeline: 740-039-8225 or TTY: 212-611-5635 TTY 212-042-6010) to talk to a trained counselor call 1-800-273-TALK (toll free, 24 hour hotline) if experiencing a Mental Health or Behavioral Health Crisis  - call for medicine refill 2 or 3 days before it runs out - take all medications exactly as prescribed - call doctor with any symptoms you believe are related to your medicine - call doctor when you  experience any new symptoms - go to all doctor appointments as scheduled  Follow Up Plan: Telephone follow up appointment with care management team member scheduled for: 02-06-2023 at 1145 am       COMPLETED: [facility] Care Management Expected Outcome:  Monitor, Self-Manage, and Reduce Symptoms of Hypertension   On track    Current Barriers:  Chronic Disease Management support and education needs related to effective management of HTN BP Readings from Last 3 Encounters:  01/11/23 130/76  10/11/22 136/82  07/06/22 128/70     Planned Interventions: Evaluation of current treatment plan related to hypertension self management and patient's adherence to plan as established by provider. The patient has good control of HTN at this time. Denies any changes in her HTN or heart health. She states that she has a wrist cuff but it is not accurate. She needs to get another one to check her blood pressures. She was asking for resources to get a cuff. Will check with resources and see if they have any information. Provided education to patient re: stroke prevention, s/s of heart attack and stroke. The patient has a good understanding of monitoring for changes in condition that warrant emergent care for HA and stroke; Reviewed prescribed diet heart healthy/ADA diet. Review of heart healthy/ADA diet. The patient is compliant with Heart Healthy/ADA diet. The patient states that she is losing weight and happy about that since being back on Ozempic. Reviewed medications with patient and discussed importance of compliance. The patient is compliant with medications. Working with the pharm D for ongoing support and education;  Discussed plans with patient for ongoing care management follow up and provided patient with direct contact information for care management team; Advised patient, providing education and rationale, to monitor blood pressure daily and record, calling PCP for findings outside established parameters;   Reviewed scheduled/upcoming provider appointments including: 01-11-2023 at 1120 am Advised patient to discuss changes in blood pressures or heart health with provider; Provided education on prescribed diet heart healthy/ADA diet. Review and education provided ;  Discussed complications of poorly controlled blood pressure such as heart disease, stroke, circulatory complications, vision complications, kidney impairment, sexual dysfunction;  Screening for signs and symptoms of depression related to chronic disease state;  Assessed social determinant of health barriers;   Symptom Management: Take medications as prescribed   Attend all scheduled provider appointments Call provider office for new concerns or questions  call the Suicide and Crisis Lifeline: 988 call the [place] National Suicide Prevention Lifeline: (775)243-1091 or TTY: (413)240-2732 TTY (479) 769-3069) to talk to a trained counselor call 1-800-273-TALK (toll free, 24 hour hotline) if experiencing a Mental Health or Behavioral Health Crisis  check blood pressure  weekly learn about high blood pressure keep a blood pressure log take blood pressure log to all doctor appointments call doctor for signs and symptoms of high blood pressure develop an action plan for high blood pressure keep all doctor appointments take medications for blood pressure exactly as prescribed report new symptoms to your doctor  Follow Up Plan: Telephone follow up appointment with care management team member scheduled for: 02-06-2023 at 1145 am          SDOH assessments and interventions completed:  No     Care Coordination Interventions:  Yes, provided   Interventions Today    Flowsheet Row Most Recent Value  Chronic Disease   Chronic disease during today's visit Other, Hypertension (HTN), Diabetes  [HLD]  General Interventions   General Interventions Discussed/Reviewed General Interventions Reviewed, Annual Foot Exam, Annual Eye Exam, Labs,  Doctor Visits  Labs Hgb A1c every 3 months  Doctor Visits Discussed/Reviewed Doctor Visits Reviewed, PCP  [upcoming with pharmacy team 12/4 and PCP 2/6]  PCP/Specialist Visits Compliance with follow-up visit  [completed PCP last month, no changes in plan of care]  Exercise Interventions   Exercise Discussed/Reviewed Weight Managment  Weight Management Weight loss  [weight decreased now to 141 pounds since taking Ozempic]  Education Interventions   Education Provided Provided Education  Provided Verbal Education On Foot Care, Eye Care, Nutrition, Labs, Blood Sugar Monitoring, Medication, When to see the doctor  Labs Reviewed Hgb A1c  [currently 5.5]  Nutrition Interventions   Nutrition Discussed/Reviewed Nutrition Reviewed, Carbohydrate meal planning, Adding fruits and vegetables, Decreasing sugar intake  Pharmacy Interventions   Pharmacy Dicussed/Reviewed Pharmacy Topics Reviewed, Affording Medications  [Active with pharmacy team for medication assistance for Ozempic and Jardiance]       Follow up plan: No further intervention required.   Encounter Outcome:  Patient Visit Completed   Rodney Langton, RN, MSN, CCM [place]  [facility], [facility] Health RN Care Coordinator Direct Dial: [phone] / Main [phone] Fax [phone] Email: [email or web address] Website: [email or web address]

## 2023-02-06 NOTE — Patient Instructions (Signed)
Visit Information  Thank you for taking time to visit with me today. Please don't hesitate to contact me if I can be of assistance to you.  Please call the Suicide and Crisis Lifeline: 988 call the [place] National Suicide Prevention Lifeline: 301-488-1946 or TTY: 979-776-6997 TTY (339)370-2277) to talk to a trained counselor call 1-800-273-TALK (toll free, 24 hour hotline) call 911 if you are experiencing a Mental Health or Behavioral Health Crisis or need someone to talk to.  Patient verbalizes understanding of instructions and care plan provided today and agrees to view in MyChart. Active MyChart status and patient understanding of how to access instructions and care plan via MyChart confirmed with patient.     The patient has been provided with contact information for the care management team and has been advised to call with any health related questions or concerns.   Rodney Langton, RN, MSN, CCM [facility], [facility] Health RN Care Coordinator Direct Dial: [phone] / Main [phone] Fax [phone] Email: [email or web address] Website: [email or web address]

## 2023-02-06 NOTE — Telephone Encounter (Signed)
Pt is calling in returning a call from [place]. Please follow up with pt.

## 2023-02-06 NOTE — Patient Outreach (Signed)
  Care Coordination   02/06/2023 Name: April Johnson MRN: [id number] DOB: 01/20/1956   Care Coordination Outreach Attempts:  An unsuccessful telephone outreach was attempted for a scheduled appointment today.  Follow Up Plan:  Additional outreach attempts will be made to offer the patient care coordination information and services.   Encounter Outcome:  No Answer   Care Coordination Interventions:  No, not indicated    Rodney Langton, RN, MSN, CCM [place]  [facility], [facility] Health RN Care Coordinator Direct Dial: [phone] / Main [phone] Fax [phone] Email: [email or web address] Website: [email or web address]

## 2023-02-08 ENCOUNTER — Other Ambulatory Visit: Payer: Self-pay

## 2023-02-08 NOTE — Progress Notes (Signed)
   02/08/2023  [name], female   DOB: 1955-05-16, 67 y.o.   MRN: [id number]  Patient presenting to [facility] to complete [employer] PAP application for Jardiance 25mg  re-enrollment for 2025.  Pre-filled information was reviewed, patient/provider portions signed, and application faxed to company.  Lenna Gilford, PharmD, DPLA

## 2023-02-09 MED ORDER — EMPAGLIFLOZIN 25 MG PO TABS: 25.0000 mg | ORAL_TABLET | Freq: Every day | ORAL | 4 refills | Status: DC

## 2023-02-09 NOTE — Addendum Note (Signed)
Addended by: Sabino Niemann A on: 02/09/2023 03:49 PM   Modules accepted: Orders

## 2023-02-14 ENCOUNTER — Ambulatory Visit: Payer: Self-pay | Admitting: *Deleted

## 2023-02-14 MED ORDER — GABAPENTIN 300 MG PO CAPS: 600.0000 mg | ORAL_CAPSULE | Freq: Every day | ORAL | 4 refills | Status: DC

## 2023-02-14 NOTE — Telephone Encounter (Signed)
Called and LVM notifying patient that medication has been sent in.

## 2023-02-14 NOTE — Telephone Encounter (Signed)
Routing to provider to advise.  

## 2023-02-14 NOTE — Telephone Encounter (Signed)
Message from Willernie C sent at 02/14/2023 10:31 AM EST  Summary: Medication Question   Pt called and stated that she has been taking, gabapentin (NEURONTIN) 300 MG capsule, Sig: Take 1 capsule (300 mg total) by mouth at bedtime. She is stating that she has been taking 2 per day and that this refill is wrong. Please take a look and advise.          Call History  Contact Date/Time Type Contact Phone/Fax User  02/14/2023 10:03 AM EST Phone ([address] [place], [place] (Self) [phone] Judie Petit) Harrel Carina

## 2023-02-14 NOTE — Telephone Encounter (Signed)
Reason for Disposition  [1] Caller has URGENT medicine question about med that PCP or specialist prescribed AND [2] triager unable to answer question    Gabapentin 300 mg.  Answer Assessment - Initial Assessment Questions 1. NAME of MEDICINE: "What medicine(s) are you calling about?"     Gabapentin 300 mg 2. QUESTION: "What is your question?" (e.g., double dose of medicine, side effect)     I got it refilled and it's only 1 pill a day.   Before it was 2 pills a day.   It's not enough.   I need the 2 pills.   It helps with my restless legs.    I take it with my Trazodone.    I would like to go back to the 2 pills at night. 3. PRESCRIBER: "Who prescribed the medicine?" Reason: if prescribed by specialist, call should be referred to that group.     Aura Dials, NP 4. SYMPTOMS: "Do you have any symptoms?" If Yes, ask: "What symptoms are you having?"  "How bad are the symptoms (e.g., mild, moderate, severe)     Restless legs are not relieved with 1 pill.    I need 2 pillss. 5. PREGNANCY:  "Is there any chance that you are pregnant?" "When was your last menstrual period?"     N/A due to age  Protocols used: Medication Question Call-A-AH  Chief Complaint: Last refill was only 1 pill at night of the Gabapentin 300 mg instead of her usual 2 pills at night.   No changes were discussed during her  last office visit with Aura Dials, NP.   She is wanting to go back to the 2 pills of Gabapentin at night. Symptoms: Restless legs preventing her from sleeping. Frequency: nightly since been on 1 pill a night instead of 2. Pertinent Negatives: Patient denies 1 pill being enough to control her restless legs. Disposition: [] ED /[] Urgent Care (no appt availability in office) / [] Appointment(In office/virtual)/ []  [facility]/ [] Home Care/ [] Refused Recommended Disposition /[] [facility]/ [x]  Follow-up with PCP Additional Notes: Message sent to [facility], NP.

## 2023-02-14 NOTE — Addendum Note (Signed)
Addended by: Aura Dials T on: 02/14/2023 01:11 PM   Modules accepted: Orders

## 2023-02-24 ENCOUNTER — Other Ambulatory Visit: Payer: Self-pay

## 2023-02-24 ENCOUNTER — Other Ambulatory Visit: Payer: Self-pay | Admitting: Nurse Practitioner

## 2023-02-24 MED ORDER — EMPAGLIFLOZIN 25 MG PO TABS: 25.0000 mg | ORAL_TABLET | Freq: Every day | ORAL | 3 refills | Status: DC

## 2023-02-24 NOTE — Progress Notes (Signed)
   02/24/2023  [name], female   DOB: [birth year], 67 y.o.   MRN: [id number]  Contacted [employer] to follow-up on processing of patient's 2025 re-enrollment for Jardiance 25mg .  The program states her application is missing the following:  prescription for Jardiance and number of people in the household.  Representative review the application and was able to see number of people in the household was included.  Prescription will need to be faxed [employer] at [phone].  Prescriptions cannot be e-prescribed to [facility] with re-enrollment applications.  Coordinating with PCP to get order faxed.    Lenna Gilford, PharmD, DPLA

## 2023-02-24 NOTE — Progress Notes (Signed)
error 

## 2023-03-24 ENCOUNTER — Other Ambulatory Visit: Payer: Self-pay

## 2023-03-24 NOTE — Progress Notes (Signed)
   03/24/2023  [name], female   DOB: 1955/08/15, 68 y.o.   MRN: [id number]  Subjective/Objective Telephone visit to follow-up on management of type 2 diabetes  Diabetes management -Current medications: Jardiance 25 mg daily, metformin 1000 mg twice daily with a meal, Ozempic 2 mg weekly -Last A1c was 5.5% -Patient does check home blood glucose regularly, states FBG is averaging 110 -Patient states she is down to 130 pounds  -She does endorse some hair loss, which I am concerned could be a result of not getting enough nutrition.  Patient has been supplementing a daily multiple vitamin and has started drinking protein shakes for additional nutrition.  Assessment/Plan  Diabetes management -Well controlled -I recommend a CBC and lab work to evaluate vitamin D and vitamin B12 -Continue to monitor home blood glucose daily and monitor weight.  I would not recommend additional weight loss at this time; and we may need to adjust Ozempic dose if weight loss continues.  Follow-up: 3 months  Lenna Gilford, PharmD, DPLA

## 2023-04-09 DIAGNOSIS — E119 Type 2 diabetes mellitus without complications: Secondary | ICD-10-CM | POA: Insufficient documentation

## 2023-04-09 NOTE — Patient Instructions (Signed)
 Be Involved in Caring For Your Health:  Taking Medications When medications are taken as directed, they can greatly improve your health. But if they are not taken as prescribed, they may not work. In some cases, not taking them correctly can be harmful. To help ensure your treatment remains effective and safe, understand your medications and how to take them. Bring your medications to each visit for review by your provider.  Your lab results, notes, and after visit summary will be available on My Chart. We strongly encourage you to use this feature. If lab results are abnormal the clinic will contact you with the appropriate steps. If the clinic does not contact you assume the results are satisfactory. You can always view your results on My Chart. If you have questions regarding your health or results, please contact the clinic during office hours. You can also ask questions on My Chart.  We at [facility] are grateful that you chose [place] to provide your care. We strive to provide evidence-based and compassionate care and are always looking for feedback. If you get a survey from the clinic please complete this so we can hear your opinions.  Diabetes Mellitus and Foot Care Diabetes, also called diabetes mellitus, may cause problems with your feet and legs because of poor blood flow (circulation). Poor circulation may make your skin: Become thinner and drier. Break more easily. Heal more slowly. Peel and crack. You may also have nerve damage (neuropathy). This can cause decreased feeling in your legs and feet. This means that you may not notice minor injuries to your feet that could lead to more serious problems. Finding and treating problems early is the best way to prevent future foot problems. How to care for your feet Foot hygiene  Wash your feet daily with warm water and mild soap. Do not use hot water. Then, pat your feet and the areas between your toes until they are fully dry. Do  not soak your feet. This can dry your skin. Trim your toenails straight across. Do not dig under them or around the cuticle. File the edges of your nails with an emery board or nail file. Apply a moisturizing lotion or petroleum jelly to the skin on your feet and to dry, brittle toenails. Use lotion that does not contain alcohol and is unscented. Do not apply lotion between your toes. Shoes and socks Wear clean socks or stockings every day. Make sure they are not too tight. Do not wear knee-high stockings. These may decrease blood flow to your legs. Wear shoes that fit well and have enough cushioning. Always look in your shoes before you put them on to be sure there are no objects inside. To break in new shoes, wear them for just a few hours a day. This prevents injuries on your feet. Wounds, scrapes, corns, and calluses  Check your feet daily for blisters, cuts, bruises, sores, and redness. If you cannot see the bottom of your feet, use a mirror or ask someone for help. Do not cut off corns or calluses or try to remove them with medicine. If you find a minor scrape, cut, or break in the skin on your feet, keep it and the skin around it clean and dry. You may clean these areas with mild soap and water. Do not clean the area with peroxide, alcohol, or iodine. If you have a wound, scrape, corn, or callus on your foot, look at it several times a day to make sure it  is healing and not infected. Check for: Redness, swelling, or pain. Fluid or blood. Warmth. Pus or a bad smell. General tips Do not cross your legs. This may decrease blood flow to your feet. Do not use heating pads or hot water bottles on your feet. They may burn your skin. If you have lost feeling in your feet or legs, you may not know this is happening until it is too late. Protect your feet from hot and cold by wearing shoes, such as at the beach or on hot pavement. Schedule a complete foot exam at least once a year or more often if  you have foot problems. Report any cuts, sores, or bruises to your health care provider right away. Where to find more information American Diabetes Association: diabetes.org Association of Diabetes Care & Education Specialists: diabeteseducator.org Contact a health care provider if: You have a condition that increases your risk of infection, and you have any cuts, sores, or bruises on your feet. You have an injury that is not healing. You have redness on your legs or feet. You feel burning or tingling in your legs or feet. You have pain or cramps in your legs and feet. Your legs or feet are numb. Your feet always feel cold. You have pain around any toenails. Get help right away if: You have a wound, scrape, corn, or callus on your foot and: You have signs of infection. You have a fever. You have a red line going up your leg. This information is not intended to replace advice given to you by your health care provider. Make sure you discuss any questions you have with your health care provider. Document Revised: 08/25/2021 Document Reviewed: 08/25/2021 Elsevier Patient Education  2024 [employer].

## 2023-04-13 ENCOUNTER — Ambulatory Visit: Payer: Medicare Other | Admitting: Nurse Practitioner

## 2023-04-13 ENCOUNTER — Encounter: Payer: Self-pay | Admitting: Nurse Practitioner

## 2023-04-13 VITALS — BP 129/83 | HR 83 | Temp 97.5°F | Ht 64.0 in | Wt 125.2 lb

## 2023-04-13 DIAGNOSIS — E1129 Type 2 diabetes mellitus with other diabetic kidney complication: Secondary | ICD-10-CM | POA: Diagnosis not present

## 2023-04-13 DIAGNOSIS — E119 Type 2 diabetes mellitus without complications: Secondary | ICD-10-CM

## 2023-04-13 DIAGNOSIS — F5104 Psychophysiologic insomnia: Secondary | ICD-10-CM

## 2023-04-13 DIAGNOSIS — I152 Hypertension secondary to endocrine disorders: Secondary | ICD-10-CM | POA: Diagnosis not present

## 2023-04-13 DIAGNOSIS — E1159 Type 2 diabetes mellitus with other circulatory complications: Secondary | ICD-10-CM

## 2023-04-13 DIAGNOSIS — E1142 Type 2 diabetes mellitus with diabetic polyneuropathy: Secondary | ICD-10-CM | POA: Diagnosis not present

## 2023-04-13 DIAGNOSIS — E1169 Type 2 diabetes mellitus with other specified complication: Secondary | ICD-10-CM | POA: Diagnosis not present

## 2023-04-13 DIAGNOSIS — R809 Proteinuria, unspecified: Secondary | ICD-10-CM

## 2023-04-13 DIAGNOSIS — E785 Hyperlipidemia, unspecified: Secondary | ICD-10-CM

## 2023-04-13 DIAGNOSIS — Z7985 Long-term (current) use of injectable non-insulin antidiabetic drugs: Secondary | ICD-10-CM

## 2023-04-13 LAB — MICROALBUMIN, URINE WAIVED
Creatinine, Urine Waived: 100 mg/dL (ref 10–300)
Microalb, Ur Waived: 150 mg/L — ABNORMAL HIGH (ref 0–19)
Microalb/Creat Ratio: >300 mg/g — ABNORMAL HIGH (ref <30)

## 2023-04-13 LAB — BAYER DCA HB A1C WAIVED: HB A1C (BAYER DCA - WAIVED): 5 % (ref 4.8–5.6)

## 2023-04-13 NOTE — Assessment & Plan Note (Addendum)
 Chronic, ongoing with A1c trending down to 5%!!, previous 5.5%.  Has lost a significant amount of weight, which discussed with patient today -- we plan on reducing her Ozempic  back to 1 MG.  Will get this changed via the assistance program, reached out to [facility] PharmD.  For now reduce Metformin  to 1000 MG daily and Ozempic  2 MG to every 14 days until the 1 MG pens come.  Remain off Tresiba . If sugars start to trend under 70 often she is to alert PCP and cut back further on Metformin , discussed with her today.  Educated her on medications and side effects.  Urine ALB 150 February 2025, keep Telmisartan  on board for protection.  Continue Gabapentin  for neuropathy pain.  Return in 3 months. - Eye exam up to date, foot exam up to date. - Statin and ARB on board - Vaccinations up to date

## 2023-04-13 NOTE — Assessment & Plan Note (Signed)
 Chronic, stable.  BP stable today on check. Continue current medication regimen at this time and adjust as needed.  Telmisartan  offering kidney protection.  Recommend she check BP at least 3 times a week at home + focus on DASH diet.  Continue to monitor kidney function and if GFR < 45 consider reduction Metformin .  Urine ALB 150 February 2025  LABS: CMP.  If elevations BP in future increase Amlodipine  to 10 MG.

## 2023-04-13 NOTE — Assessment & Plan Note (Signed)
 Refer to diabetes with proteinuria plan of care.

## 2023-04-13 NOTE — Progress Notes (Signed)
 BP 129/83   Pulse 83   Temp (!) 97.5 F (36.4 C) (Oral)   Ht 5' 4 (1.626 m)   Wt 125 lb 3.2 oz (56.8 kg)   LMP  (LMP Unknown)   SpO2 97%   BMI 21.49 kg/m    Subjective:    [name], female    DOB: 1956/01/10, 68 y.o.   MRN: [id number]  HPI: April Johnson is a 68 y.o. female  Chief Complaint  Patient presents with   Diabetes   Hyperlipidemia   Hypertension   DIABETES A1c 5.5% November.  Continues on Metformin  1000 MG BID, Ozempic  2 MG, and Jardiance  25 MG.  Took Tresiba  in past.  Gets Jardiance  and Ozempic  via assistance.  Has lost 17 pounds more from last visit putting total loss at 58 pounds since May 2024 on this regimen, discussed with her may need to cut back on Ozempic . Continues to take Gabapentin  600 MG at night for neuropathy (she has self increased from 300 MG), higher doses make her sleepy.  Has Trazodone  to take at night for sleep, has been taking this since loss of her husband >2 years ago.  Has cut back on carbohydrates at home.  Has started drinking a protein shake every morning. Hypoglycemic episodes:no Polydipsia/polyuria: no Visual disturbance: no Chest pain: no Paresthesias: no Glucose Monitoring: yes             Accucheck frequency: daily             Fasting glucose: 90 to 110 range             Post prandial:             Evening:              Before meals: Taking Insulin ?: yes             Long acting insulin : none             Short acting insulin : Blood Pressure Monitoring: occasionally Retinal Examination: Up To Date Foot Exam: Up to Date Pneumovax: Up to Date Influenza: Up to Date Aspirin: yes    HYPERTENSION / HYPERLIPIDEMIA  Taking Amlodipine , ASA, Lipitor, Metoprolol , and Micardis .   Satisfied with current treatment? yes Duration of hypertension: chronic BP monitoring frequency: not checking BP range:  BP medication side effects: no Duration of hyperlipidemia: chronic Cholesterol medication side effects:  no Cholesterol supplements: none Medication compliance: good compliance Aspirin: yes Recent stressors: no Recurrent headaches: no Visual changes: no Palpitations: no Dyspnea: no Chest pain: no Lower extremity edema: no Dizzy/lightheaded: no  Relevant past medical, surgical, family and social history reviewed and updated as indicated. Interim medical history since our last visit reviewed. Allergies and medications reviewed and updated.  Review of Systems  Constitutional:  Negative for activity change, appetite change, diaphoresis, fatigue and fever.  Respiratory:  Negative for cough, chest tightness and shortness of breath.   Cardiovascular:  Negative for chest pain, palpitations and leg swelling.  Gastrointestinal: Negative.   Endocrine: Negative for polydipsia, polyphagia and polyuria.  Neurological: Negative.   Psychiatric/Behavioral: Negative.     Per HPI unless specifically indicated above     Objective:    BP 129/83   Pulse 83   Temp (!) 97.5 F (36.4 C) (Oral)   Ht 5' 4 (1.626 m)   Wt 125 lb 3.2 oz (56.8 kg)   LMP  (LMP Unknown)   SpO2 97%   BMI 21.49 kg/m  Wt Readings from Last 3 Encounters:  04/13/23 125 lb 3.2 oz (56.8 kg)  01/11/23 142 lb 9.6 oz (64.7 kg)  10/11/22 158 lb 12.8 oz (72 kg)    Physical Exam Vitals and nursing note reviewed.  Constitutional:      General: She is awake. She is not in acute distress.    Appearance: She is well-developed, well-groomed and normal weight. She is not ill-appearing.  HENT:     Head: Normocephalic.     Right Ear: Hearing normal.     Left Ear: Hearing normal.  Eyes:     General: Lids are normal.        Right eye: No discharge.        Left eye: No discharge.     Conjunctiva/sclera: Conjunctivae normal.     Pupils: Pupils are equal, round, and reactive to light.  Neck:     Thyroid : No thyromegaly.     Vascular: No carotid bruit.  Cardiovascular:     Rate and Rhythm: Normal rate and regular rhythm.      Heart sounds: Normal heart sounds. No murmur heard.    No gallop.  Pulmonary:     Effort: Pulmonary effort is normal.     Breath sounds: Normal breath sounds.  Abdominal:     General: Bowel sounds are normal.     Palpations: Abdomen is soft.  Musculoskeletal:     Cervical back: Normal range of motion and neck supple.     Right lower leg: No edema.     Left lower leg: No edema.  Skin:    General: Skin is warm and dry.  Neurological:     Mental Status: She is alert and oriented to person, place, and time.  Psychiatric:        Attention and Perception: Attention normal.        Mood and Affect: Mood normal.        Speech: Speech normal.        Behavior: Behavior normal. Behavior is cooperative.        Thought Content: Thought content normal.    Results for orders placed or performed in visit on 01/11/23  Bayer DCA Hb A1c Waived   Collection Time: 01/11/23 11:30 AM  Result Value Ref Range   HB A1C (BAYER DCA - WAIVED) 5.5 4.8 - 5.6 %  Comprehensive metabolic panel   Collection Time: 01/11/23 11:30 AM  Result Value Ref Range   Glucose 99 70 - 99 mg/dL   BUN 10 8 - 27 mg/dL   Creatinine, Ser 9.39 0.57 - 1.00 mg/dL   eGFR 99 >40 fO/fpw/8.26   BUN/Creatinine Ratio 17 12 - 28   Sodium 139 134 - 144 mmol/L   Potassium 4.2 3.5 - 5.2 mmol/L   Chloride 101 96 - 106 mmol/L   CO2 24 20 - 29 mmol/L   Calcium  9.9 8.7 - 10.3 mg/dL   Total Protein 6.3 6.0 - 8.5 g/dL   Albumin 4.7 3.9 - 4.9 g/dL   Globulin, Total 1.6 1.5 - 4.5 g/dL   Bilirubin Total 0.9 0.0 - 1.2 mg/dL   Alkaline Phosphatase 63 44 - 121 IU/L   AST 17 0 - 40 IU/L   ALT 10 0 - 32 IU/L  Lipid Panel w/o Chol/HDL Ratio   Collection Time: 01/11/23 11:30 AM  Result Value Ref Range   Cholesterol, Total 102 100 - 199 mg/dL   Triglycerides 851 0 - 149 mg/dL   HDL 45 >60 mg/dL  VLDL Cholesterol Cal 25 5 - 40 mg/dL   LDL Chol Calc (NIH) 32 0 - 99 mg/dL      Assessment & Plan:   Problem List Items Addressed This Visit        Cardiovascular and Mediastinum   Hypertension associated with diabetes (HCC)   Chronic, stable.  BP stable today on check. Continue current medication regimen at this time and adjust as needed.  Telmisartan  offering kidney protection.  Recommend she check BP at least 3 times a week at home + focus on DASH diet.  Continue to monitor kidney function and if GFR < 45 consider reduction Metformin .  Urine ALB 150 February 2025  LABS: CMP.  If elevations BP in future increase Amlodipine  to 10 MG.       Relevant Orders   Bayer DCA Hb A1c Waived   Microalbumin, Urine Waived   Comprehensive metabolic panel     Endocrine   Diabetes mellitus treated with injections of non-insulin  medication (HCC)   Refer to diabetes with proteinuria plan of care.      Relevant Orders   Bayer DCA Hb A1c Waived   Microalbumin, Urine Waived   Diabetic peripheral neuropathy (HCC)   Chronic, ongoing with A1c trending down to 5%!!, previous 5.5%.  Has lost a significant amount of weight, which discussed with patient today -- we plan on reducing her Ozempic  back to 1 MG.  Will get this changed via the assistance program, reached out to [facility] PharmD.  For now reduce Metformin  to 1000 MG daily and Ozempic  2 MG to every 14 days until the 1 MG pens come.  Remain off Tresiba . If sugars start to trend under 70 often she is to alert PCP and cut back further on Metformin , discussed with her today.  Educated her on medications and side effects.  Urine ALB 150 February 2025, keep Telmisartan  on board for protection.  Continue Gabapentin  for neuropathy pain.  Return in 3 months. - Eye exam up to date, foot exam up to date. - Statin and ARB on board - Vaccinations up to date      Relevant Orders   Bayer DCA Hb A1c Waived   Microalbumin, Urine Waived   Hyperlipidemia associated with type 2 diabetes mellitus (HCC)   Chronic, ongoing.  Continue current medication regimen and adjust as needed based on labs. Lipid panel today.       Relevant Orders   Bayer DCA Hb A1c Waived   Comprehensive metabolic panel   Lipid Panel w/o Chol/HDL Ratio   Type 2 diabetes mellitus with proteinuria (HCC) - Primary   Chronic, ongoing with A1c trending down to 5%!!, previous 5.5%.  Has lost a significant amount of weight, which discussed with patient today -- we plan on reducing her Ozempic  back to 1 MG.  Will get this changed via the assistance program, reached out to [facility] PharmD.  For now reduce Metformin  to 1000 MG daily and Ozempic  2 MG to every 14 days until the 1 MG pens come.  Remain off Tresiba . If sugars start to trend under 70 often she is to alert PCP and cut back further on Metformin , discussed with her today.  Educated her on medications and side effects.  Urine ALB 150 February 2025, keep Telmisartan  on board for protection.  Return in 3 months. - Eye exam up to date, foot exam up to date. - Statin and ARB on board - Vaccinations up to date      Relevant Orders  Bayer DCA Hb A1c Waived   Microalbumin, Urine Waived     Other   Insomnia   Chronic, stable at this time.  Will continue current medication regimen and adjust as needed.  At present Trazodone  beneficial both to mood and sleep pattern.  Denies any SI/HI.         Follow up plan: Return in about 3 months (around 07/11/2023) for T2DM, HTN/HLD.

## 2023-04-13 NOTE — Assessment & Plan Note (Signed)
Chronic, ongoing.  Continue current medication regimen and adjust as needed based on labs. Lipid panel today. 

## 2023-04-13 NOTE — Assessment & Plan Note (Addendum)
 Chronic, stable at this time.  Will continue current medication regimen and adjust as needed.  At present Trazodone  beneficial both to mood and sleep pattern.  Denies any SI/HI.

## 2023-04-13 NOTE — Assessment & Plan Note (Signed)
 Chronic, ongoing with A1c trending down to 5%!!, previous 5.5%.  Has lost a significant amount of weight, which discussed with patient today -- we plan on reducing her Ozempic  back to 1 MG.  Will get this changed via the assistance program, reached out to [facility] PharmD.  For now reduce Metformin  to 1000 MG daily and Ozempic  2 MG to every 14 days until the 1 MG pens come.  Remain off Tresiba . If sugars start to trend under 70 often she is to alert PCP and cut back further on Metformin , discussed with her today.  Educated her on medications and side effects.  Urine ALB 150 February 2025, keep Telmisartan  on board for protection.  Return in 3 months. - Eye exam up to date, foot exam up to date. - Statin and ARB on board - Vaccinations up to date

## 2023-04-14 ENCOUNTER — Encounter: Payer: Self-pay | Admitting: Nurse Practitioner

## 2023-04-14 LAB — COMPREHENSIVE METABOLIC PANEL WITH GFR
ALT: 14 IU/L (ref 0–32)
AST: 18 IU/L (ref 0–40)
Albumin: 4.8 g/dL (ref 3.9–4.9)
Alkaline Phosphatase: 61 IU/L (ref 44–121)
BUN/Creatinine Ratio: 20 (ref 12–28)
BUN: 12 mg/dL (ref 8–27)
Bilirubin Total: 0.7 mg/dL (ref 0.0–1.2)
CO2: 25 mmol/L (ref 20–29)
Calcium: 10 mg/dL (ref 8.7–10.3)
Chloride: 99 mmol/L (ref 96–106)
Creatinine, Ser: 0.6 mg/dL (ref 0.57–1.00)
Globulin, Total: 1.9 g/dL (ref 1.5–4.5)
Glucose: 103 mg/dL — ABNORMAL HIGH (ref 70–99)
Potassium: 4.1 mmol/L (ref 3.5–5.2)
Sodium: 141 mmol/L (ref 134–144)
Total Protein: 6.7 g/dL (ref 6.0–8.5)
eGFR: 99 mL/min/1.73 (ref >59)

## 2023-04-14 LAB — LIPID PANEL W/O CHOL/HDL RATIO
Cholesterol, Total: 101 mg/dL (ref 100–199)
HDL: 47 mg/dL (ref >39)
LDL Chol Calc (NIH): 30 mg/dL (ref 0–99)
Triglycerides: 141 mg/dL (ref 0–149)
VLDL Cholesterol Cal: 24 mg/dL (ref 5–40)

## 2023-04-14 NOTE — Progress Notes (Signed)
 Contacted via MyChart   Good afternoon April Johnson, your labs have returned and remain stable.  No changes needed here.  [name] is going to fax form over to reduce your Ozempic  to 1 MG dosing, for now continue as we discussed.  Any questions? Keep being amazing!!  Thank you for allowing me to participate in your care.  I appreciate you. Kindest regards, Paytience Bures

## 2023-04-14 NOTE — Progress Notes (Signed)
   04/14/2023  [name], female   DOB: 1955-05-16, 68 y.o.   MRN: [id number]  In basket request from patient's PCP to assist with order change form to decrease Ozempic  to 1 mg weekly due to excess weight loss.  I have prefilled the [employer] patient assistance program order change form and I am faxing to [facility] for provider to review, sign/date, and fax into [employer] PAP.  I have a follow-up scheduled with the patient for 421/25.  [name], PharmD, DPLA

## 2023-04-18 ENCOUNTER — Telehealth: Payer: Self-pay

## 2023-04-18 ENCOUNTER — Other Ambulatory Visit: Payer: Self-pay | Admitting: Nurse Practitioner

## 2023-04-18 MED ORDER — EMPAGLIFLOZIN 25 MG PO TABS: 25.0000 mg | ORAL_TABLET | Freq: Every day | ORAL | 3 refills | Status: AC

## 2023-04-18 NOTE — Progress Notes (Signed)
   04/18/2023  [name], female   DOB: 1955/11/18, 68 y.o.   MRN: [id number]  Contacted patient to inform her that we have sent another prescription for Jardiance 25 mg to the patient assistance program, but PCP office does not have any samples of the medication on hand.  I reached out to the patient's insurance, and a 1 month supply the medication would be $180.  Patient will likely have to go without the medication for a few days until this does arrive from [facility] cares patient assistance program.  I will contact the program again tomorrow or Thursday to verify the prescription was received, in process and see when patient can expect to receive this.  April Johnson, PharmD, DPLA

## 2023-04-18 NOTE — Progress Notes (Signed)
   04/18/2023  [name], female   DOB: 05-20-1955, 68 y.o.   MRN: [id number]  Incoming call from patient stating she is down to 2 tablets remaining of Jardiance 25 mg that she received through the [employer] [facility] patient assistance program.  Contacted [facility], and they state that they have never received a new prescription for Jardiance 25 mg.  I had contacted the program on 02/24/2024 and was told the same information; so the office was going to print, have PCP sign, and fax a refill into the program.  An order was printed on that day based on the patient's chart, but the program states they never received a fax.  Coordinating with PCP office to send an order in to the assistance program, and I will see if they happen to have any samples the patient can get in the meantime.  I also informed the patient that we are working on getting Ozempic 1 mg from [employer] to replace the Ozempic 2 mg.  In the meantime, the patient will do a 2 mg injection every other week until she receives the new dose from the program.  Lenna Gilford, PharmD, DPLA

## 2023-04-20 ENCOUNTER — Telehealth: Payer: Self-pay

## 2023-04-20 NOTE — Telephone Encounter (Signed)
CALL COMPANY TO FOLLOW UP ON APPLICATION FOR JARDIANCE ([employer]) COMPANY HAS RECEIVED APPLICATION AND IS STILL PENDING DUE TO PROCESSING COMPANY SAY IT MAY TAKE UP TO 1-2 WEEKS FOR PROCESSING DUE TO PEAK ENROLLMENT SEASON.   PLEASE BE ADVISED.

## 2023-04-20 NOTE — Progress Notes (Signed)
   04/20/2023  [name], female   DOB: 12/21/1955, 68 y.o.   MRN: [id number]  Jardiance 25mg  prescription received by [employer] [facility] PAP; prescription is processing, and patient should receive in the next 10 business days.  Lenna Gilford, PharmD, DPLA

## 2023-04-25 ENCOUNTER — Telehealth: Payer: Self-pay | Admitting: Nurse Practitioner

## 2023-04-25 NOTE — Telephone Encounter (Signed)
 Received an application for assistance for Jardiance. Page 5 of the application was blank and they need that filled and resubmitted.

## 2023-04-26 NOTE — Progress Notes (Signed)
   04/26/2023  Patient ID: [name], female   DOB: 09-06-1955, 68 y.o.   MRN: [id number]  Contacted [employer] [facility] to follow-up on processing status for Jardiance 25mg  weekly.  I was told last week the electronic prescription was received and accepted, but now the program states page 5 of the PAP application must be faxed into the program to be able to process the medication fill.  I have prefilled this page and faxed to PCP to review, sign/date, and fax into [facility].  I will follow-up with the program once order is faxed to request an expedited fill.  Contacted [employer] PAP to check on order change status for Ozempic 1mg  weekly.  Program states patient has reached max fills for this enrollment period (enrollment can be renewed 3/4, but no order will go out until 4/4 when current enrollment ends).  Based on 2024 shipments patient should have enough 2mg  pens to continue every other week dosing until 1mg  can be sent in April.  I will submit a re-enrollment application for Ozempic 1mg  on 3/4.  Lenna Gilford, PharmD, DPLA

## 2023-04-28 ENCOUNTER — Telehealth: Payer: Self-pay

## 2023-04-28 NOTE — Telephone Encounter (Signed)
 PAP: Patient assistance application for [place] Pepper has been approved by PAP Companies: [employer] from 04/28/2023 to 03/06/2024. Medication should be delivered to PAP Delivery: Home. For further shipping updates, please contact [employer] ([facility]) at [phone]. Patient ID is: NO ID    PLEASE BE ADVISED PT SHIPMENT IS BEING PROCESSED 04/28/2023 WILL TAKE 5-10 BUSINESS DAYS   SENT MESSAGE IN [facility]

## 2023-05-01 ENCOUNTER — Other Ambulatory Visit (HOSPITAL_COMMUNITY): Payer: Self-pay

## 2023-05-04 ENCOUNTER — Telehealth: Payer: Self-pay

## 2023-05-04 NOTE — Progress Notes (Signed)
   05/04/2023  [name], female   DOB: 1955-11-19, 68 y.o.   MRN: [id number]  Returning missed call/voicemail from patient stating she received a letter from [facility] stating PAP for London Pepper was denied.  Informed her letter was likely outdated, because she was approved 2/21; and the medication should arrive by the middle of next week.    Patient is also inquiring about mammogram- contacting PCP to see if she can assist in getting this set up.  Lenna Gilford, PharmD, DPLA

## 2023-05-11 ENCOUNTER — Telehealth: Payer: Self-pay

## 2023-05-11 NOTE — Progress Notes (Signed)
   05/11/2023  [name], female   DOB: August 17, 1955, 68 y.o.   MRN: [id number]  [employer] patient assistance program reenrollment application for Ozempic 1 mg weekly submitted on line.  I we will follow-up to check processing status the beginning of next week.  Lenna Gilford, PharmD, DPLA

## 2023-05-11 NOTE — Telephone Encounter (Signed)
 Gave pt a call regarding her reenrollment on [employer] application for Ozempic coming up due 06/07/23 left a HIPAA VM.

## 2023-05-11 NOTE — Telephone Encounter (Signed)
 Patient call back explain, [name] has already submitted paper work for [employer] assistance application for [employer] .for 2025.

## 2023-05-16 ENCOUNTER — Telehealth: Payer: Self-pay

## 2023-05-17 NOTE — Progress Notes (Signed)
   05/17/2023  [name], female   DOB: 1955/09/03, 68 y.o.   MRN: [id number]  Contacted [employer] PAP to check on status of order change for Ozepmic dose decrease to 1mg  weekly.  Benefit verification phase currently which can take another 48 hours.  I will follow-up on the status again later this week/beginning of next.  Lenna Gilford, PharmD, DPLA

## 2023-05-23 ENCOUNTER — Telehealth: Payer: Self-pay | Admitting: Nurse Practitioner

## 2023-05-23 ENCOUNTER — Telehealth: Payer: Self-pay

## 2023-05-23 NOTE — Progress Notes (Signed)
   05/23/2023  [name], female   DOB: May 10, 1955, 68 y.o.   MRN: [id number]  Contacted [employer] patient assistance program to follow-up on processing of Ozempic 1 mg weekly dose change.  Patient has been approved for assistance starting June 10, 2023 based on current enrollment not ending until that date.  Program states medication should arrive at primary care provider's office 10-14 business days after this date, which would be around April 25.  I currently have a telephone follow-up visit scheduled with the patient April 21, and will check at that time to see if medication has arrived.  Lenna Gilford, PharmD, DPLA

## 2023-05-23 NOTE — Telephone Encounter (Signed)
 Copied from CRM [id number]. Topic: Medicare AWV >> May 23, 2023  2:52 PM Payton Doughty wrote: Reason for CRM: Called LVM 05/23/2023 to schedule AWV. Please schedule office or virtual visits.  Verlee Rossetti; Care Guide Ambulatory Clinical Support [facility] Direct Dial: [phone]

## 2023-06-05 DIAGNOSIS — H5203 Hypermetropia, bilateral: Secondary | ICD-10-CM | POA: Diagnosis not present

## 2023-06-05 DIAGNOSIS — H524 Presbyopia: Secondary | ICD-10-CM | POA: Diagnosis not present

## 2023-06-05 DIAGNOSIS — H2513 Age-related nuclear cataract, bilateral: Secondary | ICD-10-CM | POA: Diagnosis not present

## 2023-06-05 DIAGNOSIS — E119 Type 2 diabetes mellitus without complications: Secondary | ICD-10-CM | POA: Diagnosis not present

## 2023-06-05 DIAGNOSIS — H52223 Regular astigmatism, bilateral: Secondary | ICD-10-CM | POA: Diagnosis not present

## 2023-06-05 LAB — HM DIABETES EYE EXAM

## 2023-06-25 ENCOUNTER — Other Ambulatory Visit: Payer: Self-pay | Admitting: Nurse Practitioner

## 2023-06-26 ENCOUNTER — Other Ambulatory Visit: Payer: Medicare Other

## 2023-06-26 ENCOUNTER — Other Ambulatory Visit: Payer: Self-pay | Admitting: Nurse Practitioner

## 2023-06-26 MED ORDER — ONDANSETRON HCL 4 MG PO TABS: ORAL_TABLET | ORAL | 5 refills | Status: DC

## 2023-06-26 NOTE — Progress Notes (Unsigned)
   06/26/2023  [name], female   DOB: [birth year], 68 y.o.   MRN: [id number]  Subjective/Objective Telephone visit to follow-up on management of diabetes  Diabetes Management Plan -Current medications:  metformin  1000mg  BID, Jardiance  25mg  daily (PAP), Ozempic  2mg  every other week (PAP) -Awaiting arrival of Ozempic  1mg  weekly pens from [employer] PAP- should arrive by EOW, and patient will begin using 1mg  weekly -Endorses maintaining weight- initially increased to 128, now down to 123 -FBG 88-90 with no s/sx of hypoglycemia -A1c 2/6 5% -Still needing ondansetron  4mg  on occasion for nausea after Ozempic  injection- prescription expired in March, so new script needs to be sent to pharmacy  Assessment/Plan  Diabetes Management Plan -Continue current regimen at this time-switch to Ozempic  1mg  weekly once pens arrive from [employer] PAP -Continue to monitor FBG daily -Sees PCP again 5/7 and will be due for A1c  Follow-up:  3 months  Linn Rich, PharmD, DPLA

## 2023-06-26 NOTE — Telephone Encounter (Signed)
 Requested Prescriptions  Pending Prescriptions Disp Refills   metFORMIN  (GLUCOPHAGE ) 500 MG tablet [Pharmacy Med Name: METFORMIN  HCL 500 MG TABLET] 360 tablet 4    Sig: TAKE 2 TABLETS (1,000 MG TOTAL) BY MOUTH 2 (TWO) TIMES DAILY WITH A MEAL.     Endocrinology:  Diabetes - Biguanides Failed - 06/26/2023  4:26 PM      Failed - B12 Level in normal range and within 720 days    Vitamin B-12  Date Value Ref Range Status  06/03/2019 471 232 - 1,245 pg/mL Final         Passed - Cr in normal range and within 360 days    Creatinine, Ser  Date Value Ref Range Status  04/13/2023 0.60 0.57 - 1.00 mg/dL Final         Passed - HBA1C is between 0 and 7.9 and within 180 days    Hemoglobin A1C  Date Value Ref Range Status  10/27/2015 7.3%  Final   HB A1C (BAYER DCA - WAIVED)  Date Value Ref Range Status  04/13/2023 5.0 4.8 - 5.6 % Final    Comment:             Prediabetes: 5.7 - 6.4          Diabetes: >6.4          Glycemic control for adults with diabetes: <7.0          Passed - eGFR in normal range and within 360 days    GFR calc Af Amer  Date Value Ref Range Status  04/01/2020 95 >59 mL/min/1.73 Final    Comment:    **In accordance with recommendations from the NKF-ASN Task force,**   [facility] is in the process of updating its eGFR calculation to the   2021 CKD-EPI creatinine equation that estimates kidney function   without a race variable.    GFR calc non Af Amer  Date Value Ref Range Status  04/01/2020 82 >59 mL/min/1.73 Final   eGFR  Date Value Ref Range Status  04/13/2023 99 >59 mL/min/1.73 Final         Passed - Valid encounter within last 6 months    Recent Outpatient Visits           2 months ago Type 2 diabetes mellitus with proteinuria (HCC)   [facility] [place], Sanjuan Crumbly T, NP       Future Appointments             In 2 weeks Cannady, [name] T, NP [facility], [facility]            Passed - CBC within normal  limits and completed in the last 12 months    WBC  Date Value Ref Range Status  07/06/2022 8.1 3.4 - 10.8 x10E3/uL Final  01/12/2019 14.5 (H) 4.0 - 10.5 K/uL Final   RBC  Date Value Ref Range Status  07/06/2022 5.28 3.77 - 5.28 x10E6/uL Final  01/12/2019 5.30 (H) 3.87 - 5.11 MIL/uL Final   Hemoglobin  Date Value Ref Range Status  07/06/2022 14.3 11.1 - 15.9 g/dL Final   Hematocrit  Date Value Ref Range Status  07/06/2022 43.1 34.0 - 46.6 % Final   MCHC  Date Value Ref Range Status  07/06/2022 33.2 31.5 - 35.7 g/dL Final  16/12/9602 54.0 30.0 - 36.0 g/dL Final   [facility]  Date Value Ref Range Status  07/06/2022 27.1 26.6 - 33.0 pg Final  01/12/2019 26.2 26.0 -  34.0 pg Final   MCV  Date Value Ref Range Status  07/06/2022 82 79 - 97 fL Final   No results found for: "PLTCOUNTKUC", "LABPLAT", "POCPLA" RDW  Date Value Ref Range Status  07/06/2022 14.2 11.7 - 15.4 % Final          traZODone  (DESYREL ) 50 MG tablet [Pharmacy Med Name: TRAZODONE  50 MG TABLET] 90 tablet 4    Sig: TAKE 1/2 TO 1 TABLET BY MOUTH AT BEDTIME AS NEEDED FOR SLEEP     Psychiatry: Antidepressants - Serotonin Modulator Passed - 06/26/2023  4:26 PM      Passed - Valid encounter within last 6 months    Recent Outpatient Visits           2 months ago Type 2 diabetes mellitus with proteinuria (HCC)   [facility] [place], Lavelle Posey, NP       Future Appointments             In 2 weeks Cannady, [name] T, NP [facility], [facility]

## 2023-06-26 NOTE — Telephone Encounter (Signed)
 Requested Prescriptions  Pending Prescriptions Disp Refills   meloxicam  (MOBIC ) 7.5 MG tablet [Pharmacy Med Name: MELOXICAM  7.5 MG TABLET] 90 tablet 0    Sig: TAKE 1 TABLET BY MOUTH EVERY DAY     Analgesics:  COX2 Inhibitors Failed - 06/26/2023  2:26 PM      Failed - Manual Review: Labs are only required if the patient has taken medication for more than 8 weeks.      Passed - HGB in normal range and within 360 days    Hemoglobin  Date Value Ref Range Status  07/06/2022 14.3 11.1 - 15.9 g/dL Final         Passed - Cr in normal range and within 360 days    Creatinine, Ser  Date Value Ref Range Status  04/13/2023 0.60 0.57 - 1.00 mg/dL Final         Passed - HCT in normal range and within 360 days    Hematocrit  Date Value Ref Range Status  07/06/2022 43.1 34.0 - 46.6 % Final         Passed - AST in normal range and within 360 days    AST  Date Value Ref Range Status  04/13/2023 18 0 - 40 IU/L Final         Passed - ALT in normal range and within 360 days    ALT  Date Value Ref Range Status  04/13/2023 14 0 - 32 IU/L Final         Passed - eGFR is 30 or above and within 360 days    GFR calc Af Amer  Date Value Ref Range Status  04/01/2020 95 >59 mL/min/1.73 Final    Comment:    **In accordance with recommendations from the NKF-ASN Task force,**   [facility] is in the process of updating its eGFR calculation to the   2021 CKD-EPI creatinine equation that estimates kidney function   without a race variable.    GFR calc non Af Amer  Date Value Ref Range Status  04/01/2020 82 >59 mL/min/1.73 Final   eGFR  Date Value Ref Range Status  04/13/2023 99 >59 mL/min/1.73 Final         Passed - Patient is not pregnant      Passed - Valid encounter within last 12 months    Recent Outpatient Visits           2 months ago Type 2 diabetes mellitus with proteinuria (HCC)   [facility] [place], Lavelle Posey, NP       Future Appointments              In 2 weeks Cannady, [name] T, NP [facility], [facility]             omeprazole  (PRILOSEC) 20 MG capsule [Pharmacy Med Name: OMEPRAZOLE  DR 20 MG CAPSULE] 90 capsule 0    Sig: TAKE 1 CAPSULE BY MOUTH EVERY DAY     Gastroenterology: Proton Pump Inhibitors Passed - 06/26/2023  2:26 PM      Passed - Valid encounter within last 12 months    Recent Outpatient Visits           2 months ago Type 2 diabetes mellitus with proteinuria (HCC)   [facility] [place], Lavelle Posey, NP       Future Appointments             In 2 weeks Cannady, [name] T, NP [facility], [facility]

## 2023-07-03 ENCOUNTER — Telehealth: Payer: Self-pay

## 2023-07-03 NOTE — Telephone Encounter (Signed)
 4 boxes of Ozempic  received for the patient. Called and notified patient that her medication was delivered and ready to be picked up.

## 2023-07-09 NOTE — Patient Instructions (Incomplete)
Be Involved in Caring For Your Health:  Taking Medications When medications are taken as directed, they can greatly improve your health. But if they are not taken as prescribed, they may not work. In some cases, not taking them correctly can be harmful. To help ensure your treatment remains effective and safe, understand your medications and how to take them. Bring your medications to each visit for review by your provider.  Your lab results, notes, and after visit summary will be available on My Chart. We strongly encourage you to use this feature. If lab results are abnormal the clinic will contact you with the appropriate steps. If the clinic does not contact you assume the results are satisfactory. You can always view your results on My Chart. If you have questions regarding your health or results, please contact the clinic during office hours. You can also ask questions on My Chart.  We at [facility] are grateful that you chose [place] to provide your care. We strive to provide evidence-based and compassionate care and are always looking for feedback. If you get a survey from the clinic please complete this so we can hear your opinions.  Diabetes Mellitus and Exercise Regular exercise is important for your health, especially if you have diabetes mellitus. Exercise is not just about losing weight. It can also help you increase muscle strength and bone density and reduce body fat and stress. This can help your level of endurance and make you more fit and flexible. Why should I exercise if I have diabetes? Exercise has many benefits for people with diabetes. It can: Help lower and control your blood sugar (glucose). Help your body respond better and become more sensitive to the hormone insulin. Reduce how much insulin your body needs. Lower your risk for heart disease by: Lowering how much "bad" cholesterol and triglycerides you have in your body. Increasing how much "good" cholesterol  you have in your body. Lowering your blood pressure. Lowering your blood glucose levels. What is my activity plan? Your health care provider or an expert trained in diabetes care (certified diabetes educator) can help you make an activity plan. This plan can help you find the type of exercise that works for you. It may also tell you how often to exercise and for how long. Be sure to: Get at least 150 minutes of medium-intensity or high-intensity exercise each week. This may involve brisk walking, biking, or water aerobics. Do stretching and strengthening exercises at least 2 times a week. This may involve yoga or weight lifting. Spread out your activity over at least 3 days of the week. Get some form of physical activity each day. Do not go more than 2 days in a row without some kind of activity. Avoid being inactive for more than 30 minutes at a time. Take frequent breaks to walk or stretch. Choose activities that you enjoy. Set goals that you know you can accomplish. Start slowly and increase the intensity of your exercise over time. How do I manage my diabetes during exercise?  Monitor your blood glucose Check your blood glucose before and after you exercise. If your blood glucose is 240 mg/dL (40.9 mmol/L) or higher before you exercise, check your urine for ketones. These are chemicals created by the liver. If you have ketones in your urine, do not exercise until your blood glucose returns to normal. If your blood glucose is 100 mg/dL (5.6 mmol/L) or lower, eat a snack that has 15-20 grams of carbohydrate in  it. Check your blood glucose 15 minutes after the snack to make sure that your level is above 100 mg/dL (5.6 mmol/L) before you start to exercise. Your risk for low blood glucose (hypoglycemia) goes up during and after exercise. Know the symptoms of this condition and how to treat it. Follow these instructions at home: Keep a carbohydrate snack on hand for use before, during, and after  exercise. This can help prevent or treat hypoglycemia. Avoid injecting insulin into parts of your body that are going to be used during exercise. This may include: Your arms, when you are going to play tennis. Your legs, when you are about to go jogging. Keep track of your exercise habits. This can help you and your health care provider watch and adjust your activity plan. Write down: What you eat before and after you exercise. Blood glucose levels before and after you exercise. The type and amount of exercise you do. Talk to your health care provider before you start a new activity. They may need to: Make sure that the activity is safe for you. Adjust your insulin, other medicines, and food that you eat. Drink water while you exercise. This can stop you from losing too much water (dehydration). It can also prevent problems caused by having a lot of heat in your body (heat stroke). Where to find more information American Diabetes Association: diabetes.org Association of Diabetes Care & Education Specialists: diabeteseducator.org This information is not intended to replace advice given to you by your health care provider. Make sure you discuss any questions you have with your health care provider. Document Revised: 08/11/2021 Document Reviewed: 08/11/2021 Elsevier Patient Education  2024 [employer].

## 2023-07-12 ENCOUNTER — Ambulatory Visit: Payer: Medicare Other | Admitting: Nurse Practitioner

## 2023-07-12 ENCOUNTER — Encounter: Payer: Self-pay | Admitting: Nurse Practitioner

## 2023-07-12 ENCOUNTER — Telehealth: Payer: Self-pay | Admitting: Nurse Practitioner

## 2023-07-12 VITALS — BP 130/82 | HR 81 | Temp 97.8°F | Ht 63.9 in | Wt 123.2 lb

## 2023-07-12 DIAGNOSIS — E119 Type 2 diabetes mellitus without complications: Secondary | ICD-10-CM | POA: Diagnosis not present

## 2023-07-12 DIAGNOSIS — E1142 Type 2 diabetes mellitus with diabetic polyneuropathy: Secondary | ICD-10-CM

## 2023-07-12 DIAGNOSIS — E1169 Type 2 diabetes mellitus with other specified complication: Secondary | ICD-10-CM | POA: Diagnosis not present

## 2023-07-12 DIAGNOSIS — I152 Hypertension secondary to endocrine disorders: Secondary | ICD-10-CM

## 2023-07-12 DIAGNOSIS — E785 Hyperlipidemia, unspecified: Secondary | ICD-10-CM | POA: Diagnosis not present

## 2023-07-12 DIAGNOSIS — R809 Proteinuria, unspecified: Secondary | ICD-10-CM

## 2023-07-12 DIAGNOSIS — E1159 Type 2 diabetes mellitus with other circulatory complications: Secondary | ICD-10-CM

## 2023-07-12 DIAGNOSIS — E1129 Type 2 diabetes mellitus with other diabetic kidney complication: Secondary | ICD-10-CM

## 2023-07-12 DIAGNOSIS — Z7985 Long-term (current) use of injectable non-insulin antidiabetic drugs: Secondary | ICD-10-CM

## 2023-07-12 DIAGNOSIS — F5104 Psychophysiologic insomnia: Secondary | ICD-10-CM

## 2023-07-12 DIAGNOSIS — E559 Vitamin D deficiency, unspecified: Secondary | ICD-10-CM

## 2023-07-12 DIAGNOSIS — K209 Esophagitis, unspecified without bleeding: Secondary | ICD-10-CM | POA: Diagnosis not present

## 2023-07-12 DIAGNOSIS — M85852 Other specified disorders of bone density and structure, left thigh: Secondary | ICD-10-CM | POA: Diagnosis not present

## 2023-07-12 LAB — BAYER DCA HB A1C WAIVED: HB A1C (BAYER DCA - WAIVED): 5.2 % (ref 4.8–5.6)

## 2023-07-12 NOTE — Telephone Encounter (Signed)
 Called and notified patient of [name]'s message.

## 2023-07-12 NOTE — Assessment & Plan Note (Signed)
 Chronic, ongoing with osteopenia.  Continues Vitamin D3 2000 units daily and adjust as needed.

## 2023-07-12 NOTE — Assessment & Plan Note (Signed)
 Chronic, ongoing.  Will continue current medication regimen and adjust as needed.  At present Trazodone  beneficial both to mood and sleep pattern.  Denies any SI/HI.

## 2023-07-12 NOTE — Assessment & Plan Note (Signed)
 Chronic, stable.  BP stable today on check. Continue current medication regimen at this time and adjust as needed.  Telmisartan  offering kidney protection.  Recommend she check BP at least 3 times a week at home + focus on DASH diet.  Continue to monitor kidney function and if GFR < 45 consider reduction Metformin .  Urine ALB 150 February 2025  LABS: CBC, TSH, CMP.  If elevations BP in future increase Amlodipine  to 10 MG.

## 2023-07-12 NOTE — Telephone Encounter (Signed)
 Medication picked up by the patient.

## 2023-07-12 NOTE — Assessment & Plan Note (Signed)
 Chronic, ongoing with A1c 5.2% today remaining well below goal.  Has lost a significant amount of weight, which discussed with patient today.  Recommend she reduce Ozempic  to monthly dosing, as she is doing every two weeks at present.  She will be starting the 1 MG dosing soon.  Goal is to not have to discontinue GLP1 as she did gain quite a bit of weight when this happened in past, but discussed with her we do not want her underweight either.  Continue Metformin  1000 MG daily and Jaridance.  Remain off Tresiba . If sugars start to trend under 70 often she is to alert PCP and cut back further on Metformin , discussed with her today.  Educated her on medications and side effects.  Urine ALB 150 February 2025, keep Telmisartan  on board for protection.  Return in 3 months. - Eye exam up to date, foot exam up to date. - Statin and ARB on board - Vaccinations up to date

## 2023-07-12 NOTE — Assessment & Plan Note (Signed)
 Refer to diabetes with proteinuria plan of care.

## 2023-07-12 NOTE — Assessment & Plan Note (Signed)
Chronic, stable with no symptoms with Prilosec on board.  Continue current medication regimen and adjust as needed.  Mag level today.  Risks of PPI use were discussed with patient including bone loss, C. Diff diarrhea, pneumonia, infections, CKD, electrolyte abnormalities.  Verbalizes understanding and chooses to continue the medication.

## 2023-07-12 NOTE — Assessment & Plan Note (Signed)
 Chronic, ongoing with A1c 5.2% today remaining well below goal.  Has lost a significant amount of weight, which discussed with patient today.  Recommend she reduce Ozempic  to monthly dosing, as she is doing every two weeks at present.  She will be starting the 1 MG dosing soon.  Goal is to not have to discontinue GLP1 as she did gain quite a bit of weight when this happened in past, but discussed with her we do not want her underweight either.  Continue Metformin  1000 MG daily and Jaridance.  Remain off Tresiba . If sugars start to trend under 70 often she is to alert PCP and cut back further on Metformin , discussed with her today.  Educated her on medications and side effects.  Urine ALB 150 February 2025, keep Telmisartan  on board for protection.  Continue Gabapentin  at night for neuropathy.  Return in 3 months. - Eye exam up to date, foot exam up to date. - Statin and ARB on board - Vaccinations up to date

## 2023-07-12 NOTE — Progress Notes (Addendum)
 BP 130/82 (BP Location: Left Arm, Patient Position: Sitting)   Pulse 81   Temp 97.8 F (36.6 C) (Oral)   Ht 5' 3.9" (1.623 m)   Wt 123 lb 3.2 oz (55.9 kg)   LMP  (LMP Unknown)   SpO2 98%   BMI 21.21 kg/m    Subjective:    [name], female    DOB: 08-Oct-1955, 68 y.o.   MRN: [id number]  HPI: April Johnson is a 68 y.o. female  Chief Complaint  Patient presents with   Diabetes   Hyperlipidemia   Hypertension   DIABETES February A1c 5%. Taking Metformin  1000 MG daily, Ozempic  2 MG (taking every 2 weeks per instruction due to weight loss, has not started 1 MG as of yet), and Jardiance  25 MG.  Gets Jardiance  and Ozempic  via assistance.  Has lost 2 pounds more from last visit putting total loss at 60 pounds since May 2024. Taking Gabapentin  600 MG at night for neuropathy, higher doses make her sleepy.  Continues Trazodone  to take at night for sleep, has been taking this since loss of her husband >2 years ago.  Is trying eat more protein and protein supplements at home to help with weight gain. Took Tresiba  in past. Hypoglycemic episodes:no Polydipsia/polyuria: no Visual disturbance: no Chest pain: no Paresthesias: no Glucose Monitoring: yes             Accucheck frequency: daily             Fasting glucose: 80 to 110 range             Post prandial:             Evening:              Before meals: Taking Insulin ?: yes             Long acting insulin : none             Short acting insulin : Blood Pressure Monitoring: occasionally Retinal Examination: Up To Date Foot Exam: Up to Date Pneumovax: Up to Date Influenza: Up to Date Aspirin: yes    HYPERTENSION / HYPERLIPIDEMIA  Continues Amlodipine , ASA, Lipitor, Metoprolol , and Micardis .   Satisfied with current treatment? yes Duration of hypertension: chronic BP monitoring frequency: daily BP range:  BP medication side effects: no Duration of hyperlipidemia: chronic Cholesterol medication side  effects: no Cholesterol supplements: none Medication compliance: good compliance Aspirin: yes Recent stressors: no Recurrent headaches: no Visual changes: no Palpitations: no Dyspnea: no Chest pain: no Lower extremity edema: no Dizzy/lightheaded: no  GERD Continues on Omeprazole  daily. GERD control status: stable Satisfied with current treatment? yes Heartburn frequency: none Medication side effects: no  Medication compliance: stable Alleviatiating factors:  Prilosec Aggravating factors: Certain foods Dysphagia: no Odynophagia:  no Hematemesis: no Blood in stool: no EGD: no   OSTEOPENIA Noted on scan 2024. Continues to take Vitamin D  daily. No recent falls or fractures. Satisfied with current treatment?: no Adequate calcium  & vitamin D : no Weight bearing exercises: yes   Relevant past medical, surgical, family and social history reviewed and updated as indicated. Interim medical history since our last visit reviewed. Allergies and medications reviewed and updated.  Review of Systems  Constitutional:  Negative for activity change, appetite change, diaphoresis, fatigue and fever.  Respiratory:  Negative for cough, chest tightness and shortness of breath.   Cardiovascular:  Negative for chest pain, palpitations and leg swelling.  Gastrointestinal: Negative.   Endocrine:  Negative for polydipsia, polyphagia and polyuria.  Neurological: Negative.   Psychiatric/Behavioral: Negative.     Per HPI unless specifically indicated above     Objective:    BP 130/82 (BP Location: Left Arm, Patient Position: Sitting)   Pulse 81   Temp 97.8 F (36.6 C) (Oral)   Ht 5' 3.9" (1.623 m)   Wt 123 lb 3.2 oz (55.9 kg)   LMP  (LMP Unknown)   SpO2 98%   BMI 21.21 kg/m   Wt Readings from Last 3 Encounters:  07/12/23 123 lb 3.2 oz (55.9 kg)  04/13/23 125 lb 3.2 oz (56.8 kg)  01/11/23 142 lb 9.6 oz (64.7 kg)    Physical Exam Vitals and nursing note reviewed.  Constitutional:       General: She is awake. She is not in acute distress.    Appearance: She is well-developed, well-groomed and normal weight. She is not ill-appearing.  HENT:     Head: Normocephalic.     Right Ear: Hearing normal.     Left Ear: Hearing normal.  Eyes:     General: Lids are normal.        Right eye: No discharge.        Left eye: No discharge.     Conjunctiva/sclera: Conjunctivae normal.     Pupils: Pupils are equal, round, and reactive to light.  Neck:     Thyroid : No thyromegaly.     Vascular: No carotid bruit.  Cardiovascular:     Rate and Rhythm: Normal rate and regular rhythm.     Heart sounds: Normal heart sounds. No murmur heard.    No gallop.  Pulmonary:     Effort: Pulmonary effort is normal.     Breath sounds: Normal breath sounds.  Abdominal:     General: Bowel sounds are normal.     Palpations: Abdomen is soft.  Musculoskeletal:     Cervical back: Normal range of motion and neck supple.     Right lower leg: No edema.     Left lower leg: No edema.  Skin:    General: Skin is warm and dry.  Neurological:     Mental Status: She is alert and oriented to person, place, and time.  Psychiatric:        Attention and Perception: Attention normal.        Mood and Affect: Mood normal.        Speech: Speech normal.        Behavior: Behavior normal. Behavior is cooperative.        Thought Content: Thought content normal.    Diabetic Foot Exam - Simple   Simple Foot Form Visual Inspection No deformities, no ulcerations, no other skin breakdown bilaterally: Yes Sensation Testing Intact to touch and monofilament testing bilaterally: Yes Pulse Check Posterior Tibialis and Dorsalis pulse intact bilaterally: Yes Comments     Results for orders placed or performed in visit on 07/12/23  Bayer DCA Hb A1c Waived   Collection Time: 07/12/23 11:04 AM  Result Value Ref Range   HB A1C (BAYER DCA - WAIVED) 5.2 4.8 - 5.6 %      Assessment & Plan:   Problem List Items Addressed  This Visit       Cardiovascular and Mediastinum   Hypertension associated with diabetes (HCC)   Chronic, stable.  BP stable today on check. Continue current medication regimen at this time and adjust as needed.  Telmisartan  offering kidney protection.  Recommend she check BP at  least 3 times a week at home + focus on DASH diet.  Continue to monitor kidney function and if GFR < 45 consider reduction Metformin .  Urine ALB 150 February 2025  LABS: CBC, TSH, CMP.  If elevations BP in future increase Amlodipine  to 10 MG.       Relevant Orders   Bayer DCA Hb A1c Waived (Completed)   CBC with Differential/Platelet   Comprehensive metabolic panel with GFR   TSH     Digestive   Esophagitis   Chronic, stable with no symptoms with Prilosec on board.  Continue current medication regimen and adjust as needed.  Mag level today.  Risks of PPI use were discussed with patient including bone loss, C. Diff diarrhea, pneumonia, infections, CKD, electrolyte abnormalities.  Verbalizes understanding and chooses to continue the medication.         Endocrine   Type 2 diabetes mellitus with proteinuria (HCC) - Primary   Chronic, ongoing with A1c 5.2% today remaining well below goal.  Has lost a significant amount of weight, which discussed with patient today.  Recommend she reduce Ozempic  to monthly dosing, as she is doing every two weeks at present.  She will be starting the 1 MG dosing soon.  Goal is to not have to discontinue GLP1 as she did gain quite a bit of weight when this happened in past, but discussed with her we do not want her underweight either.  Continue Metformin  1000 MG daily and Jaridance.  Remain off Tresiba . If sugars start to trend under 70 often she is to alert PCP and cut back further on Metformin , discussed with her today.  Educated her on medications and side effects.  Urine ALB 150 February 2025, keep Telmisartan  on board for protection.  Return in 3 months. - Eye exam up to date, foot exam  up to date. - Statin and ARB on board - Vaccinations up to date      Relevant Orders   Bayer DCA Hb A1c Waived (Completed)   Hyperlipidemia associated with type 2 diabetes mellitus (HCC)   Chronic, ongoing.  Continue current medication regimen and adjust as needed based on labs. Lipid panel today.      Relevant Orders   Bayer DCA Hb A1c Waived (Completed)   Comprehensive metabolic panel with GFR   Lipid Panel w/o Chol/HDL Ratio   Diabetic peripheral neuropathy (HCC)   Chronic, ongoing with A1c 5.2% today remaining well below goal.  Has lost a significant amount of weight, which discussed with patient today.  Recommend she reduce Ozempic  to monthly dosing, as she is doing every two weeks at present.  She will be starting the 1 MG dosing soon.  Goal is to not have to discontinue GLP1 as she did gain quite a bit of weight when this happened in past, but discussed with her we do not want her underweight either.  Continue Metformin  1000 MG daily and Jaridance.  Remain off Tresiba . If sugars start to trend under 70 often she is to alert PCP and cut back further on Metformin , discussed with her today.  Educated her on medications and side effects.  Urine ALB 150 February 2025, keep Telmisartan  on board for protection.  Continue Gabapentin  at night for neuropathy.  Return in 3 months. - Eye exam up to date, foot exam up to date. - Statin and ARB on board - Vaccinations up to date      Relevant Orders   Bayer DCA Hb A1c Waived (Completed)  Diabetes mellitus treated with injections of non-insulin  medication (HCC)   Refer to diabetes with proteinuria plan of care.      Relevant Orders   Bayer DCA Hb A1c Waived (Completed)     Musculoskeletal and Integument   Osteopenia of neck of left femur   Diagnosed 06/27/22.  Educated on this.  Recommend adequate calcium  intake daily and Vitamin D3 2000 units daily + focus on weight bearing exercised for strengthening and fall prevention.  Repeat DEXA  around 06/27/2027.      Relevant Orders   VITAMIN D  25 Hydroxy (Vit-D Deficiency, Fractures)     Other   Vitamin D  deficiency   Chronic, ongoing with osteopenia.  Continues Vitamin D3 2000 units daily and adjust as needed.      Relevant Orders   VITAMIN D  25 Hydroxy (Vit-D Deficiency, Fractures)   Insomnia   Chronic, ongoing.  Will continue current medication regimen and adjust as needed.  At present Trazodone  beneficial both to mood and sleep pattern.  Denies any SI/HI.       Relevant Orders   Magnesium     Follow up plan: Return for T2DM, HTN/HLD -- plus needs Medicare Wellness with nurse scheduled.

## 2023-07-12 NOTE — Assessment & Plan Note (Signed)
 Diagnosed 06/27/22.  Educated on this.  Recommend adequate calcium  intake daily and Vitamin D3 2000 units daily + focus on weight bearing exercised for strengthening and fall prevention.  Repeat DEXA around 06/27/2027.

## 2023-07-12 NOTE — Telephone Encounter (Signed)
 Copied from CRM [id number]. Topic: General - Other >> Jul 12, 2023  1:17 PM Star East wrote: Reason for CRM: Patient called to advise [name] the name of the medication was  Tresiba  Flextouch that she was taking

## 2023-07-12 NOTE — Assessment & Plan Note (Signed)
Chronic, ongoing.  Continue current medication regimen and adjust as needed based on labs. Lipid panel today. 

## 2023-07-13 ENCOUNTER — Encounter: Payer: Self-pay | Admitting: Nurse Practitioner

## 2023-07-13 LAB — CBC WITH DIFFERENTIAL/PLATELET
Basophils Absolute: 0 10*3/uL (ref 0.0–0.2)
Basos: 0 %
EOS (ABSOLUTE): 0 10*3/uL (ref 0.0–0.4)
Eos: 0 %
Hematocrit: 44.6 % (ref 34.0–46.6)
Hemoglobin: 14.8 g/dL (ref 11.1–15.9)
Immature Grans (Abs): 0 10*3/uL (ref 0.0–0.1)
Immature Granulocytes: 0 %
Lymphocytes Absolute: 2.5 10*3/uL (ref 0.7–3.1)
Lymphs: 26 %
MCH: 29 pg (ref 26.6–33.0)
MCHC: 33.2 g/dL (ref 31.5–35.7)
MCV: 88 fL (ref 79–97)
Monocytes Absolute: 0.5 10*3/uL (ref 0.1–0.9)
Monocytes: 5 %
Neutrophils Absolute: 6.5 10*3/uL (ref 1.4–7.0)
Neutrophils: 69 %
Platelets: 289 10*3/uL (ref 150–450)
RBC: 5.1 x10E6/uL (ref 3.77–5.28)
RDW: 12.8 % (ref 11.7–15.4)
WBC: 9.5 10*3/uL (ref 3.4–10.8)

## 2023-07-13 LAB — COMPREHENSIVE METABOLIC PANEL WITH GFR
ALT: 12 IU/L (ref 0–32)
AST: 15 IU/L (ref 0–40)
Albumin: 4.7 g/dL (ref 3.9–4.9)
Alkaline Phosphatase: 75 IU/L (ref 44–121)
BUN/Creatinine Ratio: 24 (ref 12–28)
BUN: 14 mg/dL (ref 8–27)
Bilirubin Total: 0.9 mg/dL (ref 0.0–1.2)
CO2: 24 mmol/L (ref 20–29)
Calcium: 10.1 mg/dL (ref 8.7–10.3)
Chloride: 98 mmol/L (ref 96–106)
Creatinine, Ser: 0.58 mg/dL (ref 0.57–1.00)
Globulin, Total: 2.1 g/dL (ref 1.5–4.5)
Glucose: 97 mg/dL (ref 70–99)
Potassium: 4.2 mmol/L (ref 3.5–5.2)
Sodium: 139 mmol/L (ref 134–144)
Total Protein: 6.8 g/dL (ref 6.0–8.5)
eGFR: 100 mL/min/{1.73_m2} (ref >59)

## 2023-07-13 LAB — VITAMIN D 25 HYDROXY (VIT D DEFICIENCY, FRACTURES): Vit D, 25-Hydroxy: 37.2 ng/mL (ref 30.0–100.0)

## 2023-07-13 LAB — LIPID PANEL W/O CHOL/HDL RATIO
Cholesterol, Total: 106 mg/dL (ref 100–199)
HDL: 53 mg/dL (ref >39)
LDL Chol Calc (NIH): 30 mg/dL (ref 0–99)
Triglycerides: 136 mg/dL (ref 0–149)
VLDL Cholesterol Cal: 23 mg/dL (ref 5–40)

## 2023-07-13 LAB — TSH: TSH: 1.68 u[IU]/mL (ref 0.450–4.500)

## 2023-07-13 LAB — MAGNESIUM: Magnesium: 1.8 mg/dL (ref 1.6–2.3)

## 2023-07-13 NOTE — Progress Notes (Signed)
 Contacted via MyChart   Good morning April Johnson, your labs have returned and overall look great. I have no concerns on these.  Any questions? Keep being amazing!!  Thank you for allowing me to participate in your care.  I appreciate you. Kindest regards, Tiziana Cislo

## 2023-07-20 ENCOUNTER — Other Ambulatory Visit: Payer: Self-pay | Admitting: Nurse Practitioner

## 2023-07-24 NOTE — Telephone Encounter (Signed)
 Requested Prescriptions  Pending Prescriptions Disp Refills   metFORMIN  (GLUCOPHAGE ) 500 MG tablet [Pharmacy Med Name: METFORMIN  HCL 500 MG TABLET] 360 tablet 1    Sig: TAKE 2 TABLETS (1,000 MG TOTAL) BY MOUTH 2 (TWO) TIMES DAILY WITH A MEAL.     Endocrinology:  Diabetes - Biguanides Failed - 07/24/2023  9:17 AM      Failed - B12 Level in normal range and within 720 days    Vitamin B-12  Date Value Ref Range Status  06/03/2019 471 232 - 1,245 pg/mL Final         Passed - Cr in normal range and within 360 days    Creatinine, Ser  Date Value Ref Range Status  07/12/2023 0.58 0.57 - 1.00 mg/dL Final         Passed - HBA1C is between 0 and 7.9 and within 180 days    Hemoglobin A1C  Date Value Ref Range Status  10/27/2015 7.3%  Final   HB A1C (BAYER DCA - WAIVED)  Date Value Ref Range Status  07/12/2023 5.2 4.8 - 5.6 % Final    Comment:             Prediabetes: 5.7 - 6.4          Diabetes: >6.4          Glycemic control for adults with diabetes: <7.0          Passed - eGFR in normal range and within 360 days    GFR calc Af Amer  Date Value Ref Range Status  04/01/2020 95 >59 mL/min/1.73 Final    Comment:    **In accordance with recommendations from the NKF-ASN Task force,**   [facility] is in the process of updating its eGFR calculation to the   2021 CKD-EPI creatinine equation that estimates kidney function   without a race variable.    GFR calc non Af Amer  Date Value Ref Range Status  04/01/2020 82 >59 mL/min/1.73 Final   eGFR  Date Value Ref Range Status  07/12/2023 100 >59 mL/min/1.73 Final         Passed - Valid encounter within last 6 months    Recent Outpatient Visits           1 week ago Type 2 diabetes mellitus with proteinuria (HCC)   [facility] [place], [place] T, NP   3 months ago Type 2 diabetes mellitus with proteinuria (HCC)   [place] [facility] [place], Lexa T, NP              Passed - CBC within  normal limits and completed in the last 12 months    WBC  Date Value Ref Range Status  07/12/2023 9.5 3.4 - 10.8 x10E3/uL Final  01/12/2019 14.5 (H) 4.0 - 10.5 K/uL Final   RBC  Date Value Ref Range Status  07/12/2023 5.10 3.77 - 5.28 x10E6/uL Final  01/12/2019 5.30 (H) 3.87 - 5.11 MIL/uL Final   Hemoglobin  Date Value Ref Range Status  07/12/2023 14.8 11.1 - 15.9 g/dL Final   Hematocrit  Date Value Ref Range Status  07/12/2023 44.6 34.0 - 46.6 % Final   MCHC  Date Value Ref Range Status  07/12/2023 33.2 31.5 - 35.7 g/dL Final  82/95/6213 08.6 30.0 - 36.0 g/dL Final   [facility]  Date Value Ref Range Status  07/12/2023 29.0 26.6 - 33.0 pg Final  01/12/2019 26.2 26.0 - 34.0 pg Final   MCV  Date  Value Ref Range Status  07/12/2023 88 79 - 97 fL Final   No results found for: "PLTCOUNTKUC", "LABPLAT", "POCPLA" RDW  Date Value Ref Range Status  07/12/2023 12.8 11.7 - 15.4 % Final          traZODone  (DESYREL ) 50 MG tablet [Pharmacy Med Name: TRAZODONE  50 MG TABLET] 90 tablet 1    Sig: TAKE 1/2 TO 1 TABLET BY MOUTH AT BEDTIME AS NEEDED FOR SLEEP     Psychiatry: Antidepressants - Serotonin Modulator Passed - 07/24/2023  9:17 AM      Passed - Valid encounter within last 6 months    Recent Outpatient Visits           1 week ago Type 2 diabetes mellitus with proteinuria (HCC)   [place] [facility] [place], B[place] T, NP   3 months ago Type 2 diabetes mellitus with proteinuria (HCC)   [place] [facility] [place], Lavelle Posey, NP

## 2023-08-09 ENCOUNTER — Other Ambulatory Visit: Payer: Self-pay | Admitting: Nurse Practitioner

## 2023-08-10 NOTE — Telephone Encounter (Signed)
 Requested Prescriptions  Pending Prescriptions Disp Refills   amLODipine  (NORVASC ) 5 MG tablet [Pharmacy Med Name: AMLODIPINE  BESYLATE 5 MG TAB] 90 tablet 1    Sig: TAKE 1 TABLET (5 MG TOTAL) BY MOUTH DAILY.     Cardiovascular: Calcium  Channel Blockers 2 Passed - 08/10/2023 12:56 PM      Passed - Last BP in normal range    BP Readings from Last 1 Encounters:  07/12/23 130/82         Passed - Last Heart Rate in normal range    Pulse Readings from Last 1 Encounters:  07/12/23 81         Passed - Valid encounter within last 6 months    Recent Outpatient Visits           4 weeks ago Type 2 diabetes mellitus with proteinuria (HCC)   [facility] [place], [place] T, NP   3 months ago Type 2 diabetes mellitus with proteinuria (HCC)   [place] [facility] [place], Parshall T, NP               metoprolol  succinate (TOPROL -XL) 100 MG 24 hr tablet [Pharmacy Med Name: METOPROLOL  SUCC ER 100 MG TAB] 90 tablet 1    Sig: TAKE 1 TABLET BY MOUTH EVERY DAY WITH OR IMMEDIATELY FOLLOWING A MEAL     Cardiovascular:  Beta Blockers Passed - 08/10/2023 12:56 PM      Passed - Last BP in normal range    BP Readings from Last 1 Encounters:  07/12/23 130/82         Passed - Last Heart Rate in normal range    Pulse Readings from Last 1 Encounters:  07/12/23 81         Passed - Valid encounter within last 6 months    Recent Outpatient Visits           4 weeks ago Type 2 diabetes mellitus with proteinuria (HCC)   [place] [facility] [place], [place] T, NP   3 months ago Type 2 diabetes mellitus with proteinuria (HCC)   [facility] [place], [name] T, NP               atorvastatin  (LIPITOR) 40 MG tablet [Pharmacy Med Name: ATORVASTATIN  40 MG TABLET] 90 tablet 3    Sig: TAKE 1 TABLET BY MOUTH EVERY DAY     Cardiovascular:  Antilipid - Statins Failed - 08/10/2023 12:56 PM      Failed - Lipid Panel in normal range  within the last 12 months    Cholesterol, Total  Date Value Ref Range Status  07/12/2023 106 100 - 199 mg/dL Final   Cholesterol Piccolo, Waived  Date Value Ref Range Status  02/15/2019 130 <200 mg/dL Final    Comment:                            Desirable                <200                         Borderline High      200- 239                         High                     >  239    LDL Chol Calc (NIH)  Date Value Ref Range Status  07/12/2023 30 0 - 99 mg/dL Final   HDL  Date Value Ref Range Status  07/12/2023 53 >39 mg/dL Final   Triglycerides  Date Value Ref Range Status  07/12/2023 136 0 - 149 mg/dL Final   Triglycerides Piccolo,Waived  Date Value Ref Range Status  02/15/2019 261 (H) <150 mg/dL Final    Comment:                            Normal                   <150                         Borderline High     150 - 199                         High                200 - 499                         Very High                >499          Passed - Patient is not pregnant      Passed - Valid encounter within last 12 months    Recent Outpatient Visits           4 weeks ago Type 2 diabetes mellitus with proteinuria (HCC)   [place] [facility] [place], Dod[place] T, NP   3 months ago Type 2 diabetes mellitus with proteinuria (HCC)   [place] [facility] [place], [name] T, NP              Refused Prescriptions Disp Refills   omeprazole  (PRILOSEC) 20 MG capsule [Pharmacy Med Name: OMEPRAZOLE  DR 20 MG CAPSULE] 90 capsule 0    Sig: TAKE 1 CAPSULE BY MOUTH EVERY DAY     Gastroenterology: Proton Pump Inhibitors Passed - 08/10/2023 12:56 PM      Passed - Valid encounter within last 12 months    Recent Outpatient Visits           4 weeks ago Type 2 diabetes mellitus with proteinuria (HCC)   [place] [facility] [place], [place] T, NP   3 months ago Type 2 diabetes mellitus with proteinuria (HCC)   [facility] [place], Lavelle Posey, NP

## 2023-09-13 ENCOUNTER — Other Ambulatory Visit: Payer: Self-pay | Admitting: Nurse Practitioner

## 2023-09-15 NOTE — Telephone Encounter (Signed)
 Requested Prescriptions  Pending Prescriptions Disp Refills   omeprazole  (PRILOSEC) 20 MG capsule [Pharmacy Med Name: OMEPRAZOLE  DR 20 MG CAPSULE] 90 capsule 1    Sig: TAKE 1 CAPSULE BY MOUTH EVERY DAY     Gastroenterology: Proton Pump Inhibitors Passed - 09/15/2023 11:26 AM      Passed - Valid encounter within last 12 months    Recent Outpatient Visits           2 months ago Type 2 diabetes mellitus with proteinuria (HCC)   [place] [facility] [place], [place] T, NP   5 months ago Type 2 diabetes mellitus with proteinuria (HCC)   [place] [facility] [place], [place] T, NP               telmisartan  (MICARDIS ) 80 MG tablet [Pharmacy Med Name: TELMISARTAN  80 MG TABLET] 90 tablet 1    Sig: TAKE 1 TABLET BY MOUTH EVERY DAY     Cardiovascular:  Angiotensin Receptor Blockers Passed - 09/15/2023 11:26 AM      Passed - Cr in normal range and within 180 days    Creatinine, Ser  Date Value Ref Range Status  07/12/2023 0.58 0.57 - 1.00 mg/dL Final         Passed - K in normal range and within 180 days    Potassium  Date Value Ref Range Status  07/12/2023 4.2 3.5 - 5.2 mmol/L Final         Passed - Patient is not pregnant      Passed - Last BP in normal range    BP Readings from Last 1 Encounters:  07/12/23 130/82         Passed - Valid encounter within last 6 months    Recent Outpatient Visits           2 months ago Type 2 diabetes mellitus with proteinuria (HCC)   [place] [facility] [place], [place] T, NP   5 months ago Type 2 diabetes mellitus with proteinuria (HCC)   [place] [facility] [place], Melanie DASEN, NP

## 2023-09-22 ENCOUNTER — Other Ambulatory Visit: Payer: Self-pay | Admitting: Nurse Practitioner

## 2023-09-22 NOTE — Telephone Encounter (Signed)
 Requested Prescriptions  Pending Prescriptions Disp Refills   meloxicam  (MOBIC ) 7.5 MG tablet [Pharmacy Med Name: MELOXICAM  7.5 MG TABLET] 90 tablet 1    Sig: TAKE 1 TABLET BY MOUTH EVERY DAY     Analgesics:  COX2 Inhibitors Failed - 09/22/2023  4:13 PM      Failed - Manual Review: Labs are only required if the patient has taken medication for more than 8 weeks.      Passed - HGB in normal range and within 360 days    Hemoglobin  Date Value Ref Range Status  07/12/2023 14.8 11.1 - 15.9 g/dL Final         Passed - Cr in normal range and within 360 days    Creatinine, Ser  Date Value Ref Range Status  07/12/2023 0.58 0.57 - 1.00 mg/dL Final         Passed - HCT in normal range and within 360 days    Hematocrit  Date Value Ref Range Status  07/12/2023 44.6 34.0 - 46.6 % Final         Passed - AST in normal range and within 360 days    AST  Date Value Ref Range Status  07/12/2023 15 0 - 40 IU/L Final         Passed - ALT in normal range and within 360 days    ALT  Date Value Ref Range Status  07/12/2023 12 0 - 32 IU/L Final         Passed - eGFR is 30 or above and within 360 days    GFR calc Af Amer  Date Value Ref Range Status  04/01/2020 95 >59 mL/min/1.73 Final    Comment:    **In accordance with recommendations from the NKF-ASN Task force,**   [facility] is in the process of updating its eGFR calculation to the   2021 CKD-EPI creatinine equation that estimates kidney function   without a race variable.    GFR calc non Af Amer  Date Value Ref Range Status  04/01/2020 82 >59 mL/min/1.73 Final   eGFR  Date Value Ref Range Status  07/12/2023 100 >59 mL/min/1.73 Final         Passed - Patient is not pregnant      Passed - Valid encounter within last 12 months    Recent Outpatient Visits           2 months ago Type 2 diabetes mellitus with proteinuria (HCC)   [facility] [place], [place] T, NP   5 months ago Type 2 diabetes mellitus  with proteinuria (HCC)    [facility] [place], Melanie DASEN, NP

## 2023-09-25 ENCOUNTER — Other Ambulatory Visit: Payer: Self-pay

## 2023-09-25 NOTE — Progress Notes (Signed)
   09/25/2023  [name], female   DOB: 02/05/1956, 68 y.o.   MRN: [id number]  Subjective/Objective Telephone visit to follow-up on management of diabetes   Diabetes Management Plan -Current medications:  metformin  1000mg  BID, Jardiance  25mg  daily (PAP), Ozempic  1mg  monthly (PAP) -Patient recently decreased Ozempic  from 2mg  every other week to 1mg  monthly to prevent further weight loss (weight was down to 119-123) -Since doing so, she has noticed a significant increase in appetite, but she does not weight herself at home (uses scale at sister's house to weigh on occasion) -FBG has also increased to 105-140 versus <100 previously -A1c 5.2%   Assessment/Plan   Diabetes Management Plan -Based on t1/2 of Ozempic  1mg  in post-menopausal females (167hr), I do recommend increasing to 1mg  every other week to maintain FBG <130 -Advised patient to monitor weight at least once a week; if this continues to decrease with Ozempic  at 1mg  every other week, we could further decrease dose to 0.5mg  every other week or 0.25mg  weekly.  Current BMI of 21, which is healthy; but concern for muscle loss and bone health being compromised with additional weight loss. -Continue to monitor FBG daily -Sees PCP again in August and will be due for A1c   Follow-up:  3 months   April Johnson, PharmD, DPLA

## 2023-10-05 ENCOUNTER — Ambulatory Visit

## 2023-10-14 NOTE — Patient Instructions (Signed)
Be Involved in Caring For Your Health:  Taking Medications When medications are taken as directed, they can greatly improve your health. But if they are not taken as prescribed, they may not work. In some cases, not taking them correctly can be harmful. To help ensure your treatment remains effective and safe, understand your medications and how to take them. Bring your medications to each visit for review by your provider.  Your lab results, notes, and after visit summary will be available on My Chart. We strongly encourage you to use this feature. If lab results are abnormal the clinic will contact you with the appropriate steps. If the clinic does not contact you assume the results are satisfactory. You can always view your results on My Chart. If you have questions regarding your health or results, please contact the clinic during office hours. You can also ask questions on My Chart.  We at [facility] are grateful that you chose [place] to provide your care. We strive to provide evidence-based and compassionate care and are always looking for feedback. If you get a survey from the clinic please complete this so we can hear your opinions.  Diabetes Mellitus and Exercise Regular exercise is important for your health, especially if you have diabetes mellitus. Exercise is not just about losing weight. It can also help you increase muscle strength and bone density and reduce body fat and stress. This can help your level of endurance and make you more fit and flexible. Why should I exercise if I have diabetes? Exercise has many benefits for people with diabetes. It can: Help lower and control your blood sugar (glucose). Help your body respond better and become more sensitive to the hormone insulin. Reduce how much insulin your body needs. Lower your risk for heart disease by: Lowering how much "bad" cholesterol and triglycerides you have in your body. Increasing how much "good" cholesterol  you have in your body. Lowering your blood pressure. Lowering your blood glucose levels. What is my activity plan? Your health care provider or an expert trained in diabetes care (certified diabetes educator) can help you make an activity plan. This plan can help you find the type of exercise that works for you. It may also tell you how often to exercise and for how long. Be sure to: Get at least 150 minutes of medium-intensity or high-intensity exercise each week. This may involve brisk walking, biking, or water aerobics. Do stretching and strengthening exercises at least 2 times a week. This may involve yoga or weight lifting. Spread out your activity over at least 3 days of the week. Get some form of physical activity each day. Do not go more than 2 days in a row without some kind of activity. Avoid being inactive for more than 30 minutes at a time. Take frequent breaks to walk or stretch. Choose activities that you enjoy. Set goals that you know you can accomplish. Start slowly and increase the intensity of your exercise over time. How do I manage my diabetes during exercise?  Monitor your blood glucose Check your blood glucose before and after you exercise. If your blood glucose is 240 mg/dL (40.9 mmol/L) or higher before you exercise, check your urine for ketones. These are chemicals created by the liver. If you have ketones in your urine, do not exercise until your blood glucose returns to normal. If your blood glucose is 100 mg/dL (5.6 mmol/L) or lower, eat a snack that has 15-20 grams of carbohydrate in  it. Check your blood glucose 15 minutes after the snack to make sure that your level is above 100 mg/dL (5.6 mmol/L) before you start to exercise. Your risk for low blood glucose (hypoglycemia) goes up during and after exercise. Know the symptoms of this condition and how to treat it. Follow these instructions at home: Keep a carbohydrate snack on hand for use before, during, and after  exercise. This can help prevent or treat hypoglycemia. Avoid injecting insulin into parts of your body that are going to be used during exercise. This may include: Your arms, when you are going to play tennis. Your legs, when you are about to go jogging. Keep track of your exercise habits. This can help you and your health care provider watch and adjust your activity plan. Write down: What you eat before and after you exercise. Blood glucose levels before and after you exercise. The type and amount of exercise you do. Talk to your health care provider before you start a new activity. They may need to: Make sure that the activity is safe for you. Adjust your insulin, other medicines, and food that you eat. Drink water while you exercise. This can stop you from losing too much water (dehydration). It can also prevent problems caused by having a lot of heat in your body (heat stroke). Where to find more information American Diabetes Association: diabetes.org Association of Diabetes Care & Education Specialists: diabeteseducator.org This information is not intended to replace advice given to you by your health care provider. Make sure you discuss any questions you have with your health care provider. Document Revised: 08/11/2021 Document Reviewed: 08/11/2021 Elsevier Patient Education  2024 [employer].

## 2023-10-16 ENCOUNTER — Ambulatory Visit (INDEPENDENT_AMBULATORY_CARE_PROVIDER_SITE_OTHER): Admitting: Nurse Practitioner

## 2023-10-16 ENCOUNTER — Encounter: Payer: Self-pay | Admitting: Nurse Practitioner

## 2023-10-16 VITALS — BP 122/70 | HR 73 | Temp 98.3°F | Ht 64.0 in | Wt 119.6 lb

## 2023-10-16 DIAGNOSIS — E119 Type 2 diabetes mellitus without complications: Secondary | ICD-10-CM

## 2023-10-16 DIAGNOSIS — E1129 Type 2 diabetes mellitus with other diabetic kidney complication: Secondary | ICD-10-CM | POA: Diagnosis not present

## 2023-10-16 DIAGNOSIS — I152 Hypertension secondary to endocrine disorders: Secondary | ICD-10-CM

## 2023-10-16 DIAGNOSIS — E785 Hyperlipidemia, unspecified: Secondary | ICD-10-CM

## 2023-10-16 DIAGNOSIS — E1142 Type 2 diabetes mellitus with diabetic polyneuropathy: Secondary | ICD-10-CM | POA: Diagnosis not present

## 2023-10-16 DIAGNOSIS — E1169 Type 2 diabetes mellitus with other specified complication: Secondary | ICD-10-CM

## 2023-10-16 DIAGNOSIS — Z7985 Long-term (current) use of injectable non-insulin antidiabetic drugs: Secondary | ICD-10-CM

## 2023-10-16 DIAGNOSIS — E1159 Type 2 diabetes mellitus with other circulatory complications: Secondary | ICD-10-CM | POA: Diagnosis not present

## 2023-10-16 DIAGNOSIS — F5104 Psychophysiologic insomnia: Secondary | ICD-10-CM

## 2023-10-16 DIAGNOSIS — R809 Proteinuria, unspecified: Secondary | ICD-10-CM | POA: Diagnosis not present

## 2023-10-16 LAB — BAYER DCA HB A1C WAIVED: HB A1C (BAYER DCA - WAIVED): 5.1 % (ref 4.8–5.6)

## 2023-10-16 MED ORDER — OMEPRAZOLE 20 MG PO CPDR: 20.0000 mg | DELAYED_RELEASE_CAPSULE | Freq: Every day | ORAL | 3 refills | Status: AC

## 2023-10-16 MED ORDER — METOPROLOL SUCCINATE ER 100 MG PO TB24: ORAL_TABLET | ORAL | 3 refills | Status: AC

## 2023-10-16 MED ORDER — TELMISARTAN 80 MG PO TABS: 80.0000 mg | ORAL_TABLET | Freq: Every day | ORAL | 3 refills | Status: AC

## 2023-10-16 MED ORDER — NYSTATIN 100000 UNIT/GM EX POWD: 1.0000 | Freq: Three times a day (TID) | CUTANEOUS | 5 refills | Status: AC

## 2023-10-16 MED ORDER — AMLODIPINE BESYLATE 5 MG PO TABS: 5.0000 mg | ORAL_TABLET | Freq: Every day | ORAL | 3 refills | Status: AC

## 2023-10-16 MED ORDER — TRAZODONE HCL 50 MG PO TABS: 25.0000 mg | ORAL_TABLET | Freq: Every evening | ORAL | 3 refills | Status: AC | PRN

## 2023-10-16 NOTE — Assessment & Plan Note (Signed)
 Chronic, ongoing with A1c 5.1% today remaining well below goal.  Has lost a significant amount of weight, which discussed with patient today.  Recommend she reduce 0.5 MG every other week, which will discuss with April Johnson as well.  Goal is to not have to discontinue GLP1 as she did gain quite a bit of weight when this happened in past, but discussed with her we do not want her underweight either.  Continue Metformin  1000 MG daily and Jaridance.  Remain off Tresiba . If sugars start to trend under 70 often she is to alert PCP and cut back further on Metformin , discussed with her today.  Educated her on medications and side effects.  Urine ALB 150 February 2025, keep Telmisartan  on board for protection.  Continue Gabapentin  at night for neuropathy.  Return in 3 months. - Eye exam up to date, foot exam up to date. - Statin and ARB on board - Vaccinations up to date

## 2023-10-16 NOTE — Assessment & Plan Note (Signed)
 Chronic, ongoing.  Will continue current medication regimen and adjust as needed.  At present Trazodone  beneficial both to mood and sleep pattern.  Denies any SI/HI.

## 2023-10-16 NOTE — Assessment & Plan Note (Signed)
 Chronic, ongoing with A1c 5.1% today remaining well below goal.  Has lost a significant amount of weight, which discussed with patient today.  Recommend she reduce 0.5 MG every other week, which will discuss with [name] PharmD as well.  Goal is to not have to discontinue GLP1 as she did gain quite a bit of weight when this happened in past, but discussed with her we do not want her underweight either.  Continue Metformin  1000 MG daily and Jaridance.  Remain off Tresiba . If sugars start to trend under 70 often she is to alert PCP and cut back further on Metformin , discussed with her today.  Educated her on medications and side effects.  Urine ALB 150 February 2025, keep Telmisartan  on board for protection.  Continue Gabapentin  at night for neuropathy.  Return in 3 months. - Eye exam up to date, foot exam up to date. - Statin and ARB on board - Vaccinations up to date

## 2023-10-16 NOTE — Progress Notes (Signed)
 BP 122/70 (BP Location: Left Arm, Patient Position: Sitting)   Pulse 73   Temp 98.3 F (36.8 C) (Oral)   Ht 5' 4 (1.626 m)   Wt 119 lb 9.6 oz (54.3 kg)   LMP  (LMP Unknown)   SpO2 99%   BMI 20.53 kg/m    Subjective:    [name], female    DOB: [birth year], 68 y.o.   MRN: [id number]  HPI: April Johnson is a 68 y.o. female  Chief Complaint  Patient presents with   Diabetes   Hypertension   Hyperlipidemia   DIABETES A1c in May was 5.2%. Continues on Metformin  1000 MG daily, Ozempic  1 MG (taking every 2 weeks per instruction due to weight loss), and Jardiance  25 MG.  Jardiance  and Ozempic  via assistance.  Has lost 4 pounds more from last visit putting total loss at 64 pounds since May 2024. Continues on Gabapentin  300 MG at night for neuropathy, higher doses make her sleepy + has Trazodone  to take at night for sleep, has been taking this since loss of her husband >3 years ago.  Has been doing exercises and weights in the swimming pool.  Hypoglycemic episodes:no Polydipsia/polyuria: no Visual disturbance: no Chest pain: no Paresthesias: no Glucose Monitoring: yes             Accucheck frequency: daily             Fasting glucose: 107 to 125 range             Post prandial:             Evening:              Before meals: Taking Insulin ?: yes             Long acting insulin : none             Short acting insulin : Blood Pressure Monitoring: occasionally Retinal Examination: Up To Date Foot Exam: Up to Date Pneumovax: Up to Date Influenza: Up to Date Aspirin: yes    HYPERTENSION / HYPERLIPIDEMIA  Taking Amlodipine , ASA, Lipitor, Metoprolol , and Micardis .   Satisfied with current treatment? yes Duration of hypertension: chronic BP monitoring frequency: daily BP range:  BP medication side effects: no Duration of hyperlipidemia: chronic Cholesterol medication side effects: no Cholesterol supplements: none Medication compliance: good  compliance Aspirin: yes Recent stressors: no Recurrent headaches: no Visual changes: no Palpitations: no Dyspnea: no Chest pain: no Lower extremity edema: no Dizzy/lightheaded: no  Relevant past medical, surgical, family and social history reviewed and updated as indicated. Interim medical history since our last visit reviewed. Allergies and medications reviewed and updated.  Review of Systems  Constitutional:  Negative for activity change, appetite change, diaphoresis, fatigue and fever.  Respiratory:  Negative for cough, chest tightness and shortness of breath.   Cardiovascular:  Negative for chest pain, palpitations and leg swelling.  Gastrointestinal: Negative.   Endocrine: Negative for polydipsia, polyphagia and polyuria.  Neurological: Negative.   Psychiatric/Behavioral: Negative.     Per HPI unless specifically indicated above     Objective:    BP 122/70 (BP Location: Left Arm, Patient Position: Sitting)   Pulse 73   Temp 98.3 F (36.8 C) (Oral)   Ht 5' 4 (1.626 m)   Wt 119 lb 9.6 oz (54.3 kg)   LMP  (LMP Unknown)   SpO2 99%   BMI 20.53 kg/m   Wt Readings from Last 3 Encounters:  10/16/23 119  lb 9.6 oz (54.3 kg)  07/12/23 123 lb 3.2 oz (55.9 kg)  04/13/23 125 lb 3.2 oz (56.8 kg)    Physical Exam Vitals and nursing note reviewed.  Constitutional:      General: She is awake. She is not in acute distress.    Appearance: She is well-developed, well-groomed and underweight. She is not ill-appearing or toxic-appearing.  HENT:     Head: Normocephalic.     Right Ear: Hearing normal.     Left Ear: Hearing normal.  Eyes:     General: Lids are normal.        Right eye: No discharge.        Left eye: No discharge.     Conjunctiva/sclera: Conjunctivae normal.     Pupils: Pupils are equal, round, and reactive to light.  Neck:     Thyroid : No thyromegaly.     Vascular: No carotid bruit.  Cardiovascular:     Rate and Rhythm: Normal rate and regular rhythm.      Heart sounds: Normal heart sounds. No murmur heard.    No gallop.  Pulmonary:     Effort: Pulmonary effort is normal.     Breath sounds: Normal breath sounds.  Abdominal:     General: Bowel sounds are normal.     Palpations: Abdomen is soft.  Musculoskeletal:     Cervical back: Normal range of motion and neck supple.     Right lower leg: No edema.     Left lower leg: No edema.  Skin:    General: Skin is warm and dry.  Neurological:     Mental Status: She is alert and oriented to person, place, and time.  Psychiatric:        Attention and Perception: Attention normal.        Mood and Affect: Mood normal.        Speech: Speech normal.        Behavior: Behavior normal. Behavior is cooperative.        Thought Content: Thought content normal.    Results for orders placed or performed in visit on 10/16/23  Bayer DCA Hb A1c Waived   Collection Time: 10/16/23 10:57 AM  Result Value Ref Range   HB A1C (BAYER DCA - WAIVED) 5.1 4.8 - 5.6 %      Assessment & Plan:   Problem List Items Addressed This Visit       Cardiovascular and Mediastinum   Hypertension associated with diabetes (HCC)   Chronic, stable.  BP stable today on check. Continue current medication regimen at this time and adjust as needed.  Telmisartan  offering kidney protection.  Recommend she check BP at least 3 times a week at home + focus on DASH diet.  Continue to monitor kidney function and if GFR < 45 consider reduction Metformin .  Urine ALB 150 February 2025  LABS: CMP.  If elevations BP in future increase Amlodipine  to 10 MG.       Relevant Medications   amLODipine  (NORVASC ) 5 MG tablet   metoprolol  succinate (TOPROL -XL) 100 MG 24 hr tablet   telmisartan  (MICARDIS ) 80 MG tablet   Other Relevant Orders   Bayer DCA Hb A1c Waived (Completed)   Comprehensive metabolic panel with GFR     Endocrine   Type 2 diabetes mellitus with proteinuria (HCC) - Primary   Chronic, ongoing with A1c 5.1% today remaining well  below goal.  Has lost a significant amount of weight, which discussed with patient today.  Recommend she reduce 0.5 MG every other week, which will discuss with [name] PharmD as well.  Goal is to not have to discontinue GLP1 as she did gain quite a bit of weight when this happened in past, but discussed with her we do not want her underweight either.  Continue Metformin  1000 MG daily and Jaridance.  Remain off Tresiba . If sugars start to trend under 70 often she is to alert PCP and cut back further on Metformin , discussed with her today.  Educated her on medications and side effects.  Urine ALB 150 February 2025, keep Telmisartan  on board for protection.  Continue Gabapentin  at night for neuropathy.  Return in 3 months. - Eye exam up to date, foot exam up to date. - Statin and ARB on board - Vaccinations up to date      Relevant Medications   telmisartan  (MICARDIS ) 80 MG tablet   Other Relevant Orders   Bayer DCA Hb A1c Waived (Completed)   Hyperlipidemia associated with type 2 diabetes mellitus (HCC)   Chronic, ongoing.  Continue current medication regimen and adjust as needed based on labs. Lipid panel today.      Relevant Medications   amLODipine  (NORVASC ) 5 MG tablet   metoprolol  succinate (TOPROL -XL) 100 MG 24 hr tablet   telmisartan  (MICARDIS ) 80 MG tablet   Other Relevant Orders   Bayer DCA Hb A1c Waived (Completed)   Comprehensive metabolic panel with GFR   Lipid Panel w/o Chol/HDL Ratio   Diabetic peripheral neuropathy (HCC)   Chronic, ongoing with A1c 5.1% today remaining well below goal.  Has lost a significant amount of weight, which discussed with patient today.  Recommend she reduce 0.5 MG every other week, which will discuss with [name] PharmD as well.  Goal is to not have to discontinue GLP1 as she did gain quite a bit of weight when this happened in past, but discussed with her we do not want her underweight either.  Continue Metformin  1000 MG daily and Jaridance.  Remain off  Tresiba . If sugars start to trend under 70 often she is to alert PCP and cut back further on Metformin , discussed with her today.  Educated her on medications and side effects.  Urine ALB 150 February 2025, keep Telmisartan  on board for protection.  Continue Gabapentin  at night for neuropathy.  Return in 3 months. - Eye exam up to date, foot exam up to date. - Statin and ARB on board - Vaccinations up to date      Relevant Medications   telmisartan  (MICARDIS ) 80 MG tablet   traZODone  (DESYREL ) 50 MG tablet   Other Relevant Orders   Bayer DCA Hb A1c Waived (Completed)   Diabetes mellitus treated with injections of non-insulin  medication (HCC)   Refer to diabetes with proteinuria plan of care.      Relevant Medications   telmisartan  (MICARDIS ) 80 MG tablet   Other Relevant Orders   Bayer DCA Hb A1c Waived (Completed)     Other   Insomnia   Chronic, ongoing.  Will continue current medication regimen and adjust as needed.  At present Trazodone  beneficial both to mood and sleep pattern.  Denies any SI/HI.         Follow up plan: Return in about 3 months (around 01/16/2024) for T2DM, HTN/HLD, OSTEOPENIA, INSOMNIA.

## 2023-10-16 NOTE — Assessment & Plan Note (Signed)
 Chronic, ongoing.  Continue current medication regimen and adjust as needed based on labs.  Lipid panel today.

## 2023-10-16 NOTE — Assessment & Plan Note (Signed)
 Refer to diabetes with proteinuria plan of care.

## 2023-10-16 NOTE — Assessment & Plan Note (Signed)
 Chronic, stable.  BP stable today on check. Continue current medication regimen at this time and adjust as needed.  Telmisartan  offering kidney protection.  Recommend she check BP at least 3 times a week at home + focus on DASH diet.  Continue to monitor kidney function and if GFR < 45 consider reduction Metformin .  Urine ALB 150 February 2025  LABS: CMP.  If elevations BP in future increase Amlodipine  to 10 MG.

## 2023-10-17 ENCOUNTER — Telehealth: Payer: Self-pay

## 2023-10-17 ENCOUNTER — Ambulatory Visit: Payer: Self-pay | Admitting: Nurse Practitioner

## 2023-10-17 LAB — LIPID PANEL W/O CHOL/HDL RATIO
Cholesterol, Total: 100 mg/dL (ref 100–199)
HDL: 45 mg/dL (ref >39)
LDL Chol Calc (NIH): 30 mg/dL (ref 0–99)
Triglycerides: 150 mg/dL — ABNORMAL HIGH (ref 0–149)
VLDL Cholesterol Cal: 25 mg/dL (ref 5–40)

## 2023-10-17 LAB — COMPREHENSIVE METABOLIC PANEL WITH GFR
ALT: 15 IU/L (ref 0–32)
AST: 16 IU/L (ref 0–40)
Albumin: 4.8 g/dL (ref 3.9–4.9)
Alkaline Phosphatase: 81 IU/L (ref 44–121)
BUN/Creatinine Ratio: 23 (ref 12–28)
BUN: 16 mg/dL (ref 8–27)
Bilirubin Total: 1 mg/dL (ref 0.0–1.2)
CO2: 22 mmol/L (ref 20–29)
Calcium: 10.3 mg/dL (ref 8.7–10.3)
Chloride: 98 mmol/L (ref 96–106)
Creatinine, Ser: 0.7 mg/dL (ref 0.57–1.00)
Globulin, Total: 2 g/dL (ref 1.5–4.5)
Glucose: 96 mg/dL (ref 70–99)
Potassium: 3.8 mmol/L (ref 3.5–5.2)
Sodium: 139 mmol/L (ref 134–144)
Total Protein: 6.8 g/dL (ref 6.0–8.5)
eGFR: 95 mL/min/1.73 (ref >59)

## 2023-10-17 NOTE — Progress Notes (Signed)
 Contacted via MyChart  Good morning April Johnson, labs have returned and overall remain stable. Great job!!  No changes needed.  Any questions? Keep being amazing!!  Thank you for allowing me to participate in your care.  I appreciate you. Kindest regards, Theodis Kinsel

## 2023-10-17 NOTE — Progress Notes (Signed)
   10/17/2023  [name], female   DOB: 07-25-1955, 68 y.o.   MRN: [id number]  Patient outreach to follow-up after recent visit with PCP.  Provider would like patient to decrease Ozempic  to 0.5mg  every other week to maintain good diabetes control while preventing additional weight loss.  Most recent A1c of 5.1%, and patient's weight was 119lbs, down an additional 4lbs since May.  Concern with loss of muscle mass with significant weight loss.    Patient receives Ozempic  through [facility] and currently has 5 boxes of 1mg  pens on hand.  Advised patient to dial 36 clicks on 1mg  pen to equal 0.5mg  dose.  She will give this dose once every other week and dispose of pen after 8 weeks (based on manufacturer recommendations).  Therefore, she has enough medication on hand to last another 40 weeks (until [facility] May).  Continue to monitor FBG and weight regularly.  Telephone follow-up with me 10/21 and follow-up with PCP again 11/12.  [name], PharmD, DPLA

## 2023-11-03 ENCOUNTER — Telehealth: Payer: Self-pay

## 2023-11-03 NOTE — Telephone Encounter (Signed)
 Copied from CRM [id number]. Topic: Clinical - Medication Question >> Nov 03, 2023 10:35 AM Myrick T wrote: Reason for CRM: patient called to see if the  empagliflozin  (JARDIANCE ) 25 MG TABS tablet is at the office for her to pick up or if she need to get a script for it. Please f/u with patient

## 2023-11-07 NOTE — Telephone Encounter (Signed)
 Jardiance  has not been delivered to the office. Can we check on this for the patient please?

## 2023-11-08 ENCOUNTER — Telehealth: Payer: Self-pay

## 2023-11-08 NOTE — Telephone Encounter (Signed)
 Called and notified patient that [name] is checking into this medication for her and that she will be following up with the patient. Patient verbalized understanding.

## 2023-11-08 NOTE — Progress Notes (Signed)
   11/08/2023  [name], female   DOB: [birth year], 68 y.o.   MRN: [id number]  Patient returned my call to inform me she was able to locate the Jardiance  shipment from 7/30.  [name], PharmD, DPLA

## 2023-11-08 NOTE — Progress Notes (Signed)
   11/08/2023  [name], female   DOB: June 09, 1955, 68 y.o.   MRN: [id number]  Patient contacted PCP office earlier today to inquire about Jardiance  25mg  refill.  Patient receives this medication through [facility], and the company states a 90 day refill was last filled and shipping out July 30th.  Patient is going to double check to see if she received this and may have overlooked it and will call me back.  [name], PharmD, DPLA

## 2023-11-08 NOTE — Telephone Encounter (Signed)
 Gave [facility] a call to follow up on pt not receiving Jardiance .spoke with [facility] representative said they mail out to home address on July 30th for a 90 days supply,pt is set up to received refill automatically,when pt is due.

## 2023-11-19 ENCOUNTER — Other Ambulatory Visit: Payer: Self-pay | Admitting: Nurse Practitioner

## 2023-12-19 ENCOUNTER — Ambulatory Visit

## 2023-12-26 ENCOUNTER — Other Ambulatory Visit: Payer: Self-pay

## 2023-12-26 DIAGNOSIS — E1169 Type 2 diabetes mellitus with other specified complication: Secondary | ICD-10-CM

## 2023-12-26 DIAGNOSIS — E1159 Type 2 diabetes mellitus with other circulatory complications: Secondary | ICD-10-CM

## 2023-12-26 DIAGNOSIS — E1129 Type 2 diabetes mellitus with other diabetic kidney complication: Secondary | ICD-10-CM

## 2023-12-26 NOTE — Progress Notes (Unsigned)
   12/26/2023  [name], female   DOB: 1955-04-14, 69 y.o.   MRN: [id number]  Subjective/Objective Telephone visit to follow-up on management of diabetes   Diabetes Management Plan -Current medications: metformin  1000mg  BID with meals, Jardiance  25mg  daily (PAP), Ozempic  1mg  every 8 days (PAP) (patient is doing this Ozempic  dose herself this was not what the prescriber or pharmacist recommended). - Patient was down to Ozempic  0.5 mg every other week but says she was starting to eat more and gain weight which she did not want. Patient started giving herself the full 1 mg every 10 days, and now is doing it every 8 days. Patient says she currently weighs 123 lbs but she got up to 127 lbs and that is why she increased her dose.  - Patient says blood sugar was up in the 200's. Patient asked if she could get a CGM so she didn't need to prick her finger. Insurance test claim shows that CGM would be covered by patient's insurance. Patient says her sister will be able to help her set it up on her phone for her.  - FBG around 123 but this is sometimes just a morning value not fasting. This is why patient wants a CGM so she can see the values all day long.  - Advised patient about [facility] P[facility] updates for Ozempic  in 2026. Informed patient about Medicare Extra Help and she thinks her income is around the cutoff for the program however she thinks her resources are above the cutoff limit. Suggested that since it is Medicare Open Enrollment, patient should review her plan to determine insurance coverage for her Ozempic  for 2026. Referred patient to [facility] in [place].  - A1c on 10/16/23 was 5.1% - Patient has 3 boxes of Ozempic  left.   Assessment/Plan   Diabetes Management Plan - Send rx for CGM.  - Continue to monitor FBG daily.  - Send SHIIP Information for [place] via MyChart - Sees PCP again in November.  - Patient is self dosing Ozempic  despite low weight. Talk with [name]  about switching to a lower dose pen to prevent patient from using too much Ozempic  and instead follow the prescribed dosing regimen.  - From previous note: Advised patient to monitor weight at least once a week; if this continues to decrease with Ozempic  at 1mg  every other week, we could further decrease dose to 0.5mg  every other week or 0.25mg  weekly. Current BMI of 21, which is healthy; but concern for muscle loss and bone health being compromised with additional weight loss.  - Phone call to patient on 8/12: Advised patient to dial 36 clicks on 1mg  pen to equal 0.5mg  dose. She will give this dose once every other week and dispose of pen after 8 weeks (based on manufacturer recommendations).    Follow-up:  3 months   Pearla Mckinny C. Ledora Delker [facility] PharmD Candidate Class of 2026   April Johnson, PharmD, DPLA

## 2023-12-26 NOTE — Progress Notes (Unsigned)
   12/26/2023  [name], female   DOB: [birth year], 68 y.o.   MRN: [id number]  Subjective/Objective Telephone visit to follow-up on management of diabetes   Diabetes Management Plan -Current medications:  metformin  1000mg  BID, Jardiance  25mg  daily (PAP), Ozempic  1mg  monthly (PAP) -Patient recently decreased Ozempic  from 2mg  every other week to 1mg  monthly to prevent further weight loss (weight was down to 119-123) -Since doing so, she has noticed a significant increase in appetite, but she does not weight herself at home (uses scale at sister's house to weigh on occasion) -FBG has also increased to 105-140 versus <100 previously -A1c 5.2%   Assessment/Plan   Diabetes Management Plan -Based on t1/2 of Ozempic  1mg  in post-menopausal females (167hr), I do recommend increasing to 1mg  every other week to maintain FBG <130 -Advised patient to monitor weight at least once a week; if this continues to decrease with Ozempic  at 1mg  every other week, we could further decrease dose to 0.5mg  every other week or 0.25mg  weekly.  Current BMI of 21, which is healthy; but concern for muscle loss and bone health being compromised with additional weight loss. -Continue to monitor FBG daily -Sees PCP again in August and will be due for A1c   Follow-up:  3 months   April Johnson, PharmD, DPLA

## 2023-12-27 MED ORDER — FREESTYLE LIBRE 3 PLUS SENSOR MISC: 3 refills | Status: DC

## 2024-01-14 NOTE — Patient Instructions (Incomplete)
Please call to schedule your mammogram and/or bone density: [facility] at [facility]  Address: [address], [place], [place] [address] Phone: [phone]  [facility] [address] [place],  [place]  [address] Phone: [phone]   Diabetes Mellitus and Exercise Regular exercise is important for your health, especially if you have diabetes mellitus. Exercise is not just about losing weight. It can also help you increase muscle strength and bone density and reduce body fat and stress. This can help your level of endurance and make you more fit and flexible. Why should I exercise if I have diabetes? Exercise has many benefits for people with diabetes. It can: Help lower and control your blood sugar (glucose). Help your body respond better and become more sensitive to the hormone insulin. Reduce how much insulin your body needs. Lower your risk for heart disease by: Lowering how much "bad" cholesterol and triglycerides you have in your body. Increasing how much "good" cholesterol you have in your body. Lowering your blood pressure. Lowering your blood glucose levels. What is my activity plan? Your health care provider or an expert trained in diabetes care (certified diabetes educator) can help you make an activity plan. This plan can help you find the type of exercise that works for you. It may also tell you how often to exercise and for how long. Be sure to: Get at least 150 minutes of medium-intensity or high-intensity exercise each week. This may involve brisk walking, biking, or water aerobics. Do stretching and strengthening exercises at least 2 times a week. This may involve yoga or weight lifting. Spread out your activity over at least 3 days of the week. Get some form of physical activity each day. Do not go more than 2 days in a row without some kind of activity. Avoid being inactive for more than 30 minutes at a  time. Take frequent breaks to walk or stretch. Choose activities that you enjoy. Set goals that you know you can accomplish. Start slowly and increase the intensity of your exercise over time. How do I manage my diabetes during exercise?  Monitor your blood glucose Check your blood glucose before and after you exercise. If your blood glucose is 240 mg/dL (84.1 mmol/L) or higher before you exercise, check your urine for ketones. These are chemicals created by the liver. If you have ketones in your urine, do not exercise until your blood glucose returns to normal. If your blood glucose is 100 mg/dL (5.6 mmol/L) or lower, eat a snack that has 15-20 grams of carbohydrate in it. Check your blood glucose 15 minutes after the snack to make sure that your level is above 100 mg/dL (5.6 mmol/L) before you start to exercise. Your risk for low blood glucose (hypoglycemia) goes up during and after exercise. Know the symptoms of this condition and how to treat it. Follow these instructions at home: Keep a carbohydrate snack on hand for use before, during, and after exercise. This can help prevent or treat hypoglycemia. Avoid injecting insulin into parts of your body that are going to be used during exercise. This may include: Your arms, when you are going to play tennis. Your legs, when you are about to go jogging. Keep track of your exercise habits. This can help you and your health care provider watch and adjust your activity plan. Write down: What you eat before and after you exercise. Blood glucose levels before and after you exercise.  The type and amount of exercise you do. Talk to your health care provider before you start a new activity. They may need to: Make sure that the activity is safe for you. Adjust your insulin, other medicines, and food that you eat. Drink water while you exercise. This can stop you from losing too much water (dehydration). It can also prevent problems caused by having a lot  of heat in your body (heat stroke). Where to find more information American Diabetes Association: diabetes.org Association of Diabetes Care & Education Specialists: diabeteseducator.org This information is not intended to replace advice given to you by your health care provider. Make sure you discuss any questions you have with your health care provider. Document Revised: 08/11/2021 Document Reviewed: 08/11/2021 Elsevier Patient Education  2024 [employer].

## 2024-01-17 ENCOUNTER — Ambulatory Visit: Admitting: Nurse Practitioner

## 2024-01-17 DIAGNOSIS — Z7985 Long-term (current) use of injectable non-insulin antidiabetic drugs: Secondary | ICD-10-CM

## 2024-01-17 DIAGNOSIS — Z23 Encounter for immunization: Secondary | ICD-10-CM

## 2024-01-17 DIAGNOSIS — E1129 Type 2 diabetes mellitus with other diabetic kidney complication: Secondary | ICD-10-CM

## 2024-01-17 DIAGNOSIS — E1169 Type 2 diabetes mellitus with other specified complication: Secondary | ICD-10-CM

## 2024-01-17 DIAGNOSIS — F5104 Psychophysiologic insomnia: Secondary | ICD-10-CM

## 2024-01-17 DIAGNOSIS — I152 Hypertension secondary to endocrine disorders: Secondary | ICD-10-CM

## 2024-01-17 DIAGNOSIS — E1142 Type 2 diabetes mellitus with diabetic polyneuropathy: Secondary | ICD-10-CM

## 2024-01-22 ENCOUNTER — Telehealth: Payer: Self-pay

## 2024-01-22 NOTE — Telephone Encounter (Signed)
 Filled and faxed provider portion today [facility] Jardiancefor provider to sign and date.

## 2024-01-22 NOTE — Telephone Encounter (Signed)
 Pt call back, said she will come in on 11/19 to provider office to sign pap [facility] Jarciance.

## 2024-01-22 NOTE — Telephone Encounter (Signed)
 Gave pt a call, pt is coming up due for re-enrollment on [facility] Jardiance  left a HIPAA VM

## 2024-01-30 NOTE — Telephone Encounter (Signed)
 Received provider portion [facility] Id[facility]) mail out pt portion,pt did not come to provider office to sign pap, have tried calling pt left couple of msg no call back, mail out pap to pt home address.

## 2024-02-03 NOTE — Patient Instructions (Signed)
 Please call to schedule your mammogram and/or bone density: [facility]  Address: [address], [place], [place] [address] Phone: [phone]  [facility] [address] [place],  [place]  [address] Phone: [phone]    Be Involved in Caring For Your Health:  Taking Medications When medications are taken as directed, they can greatly improve your health. But if they are not taken as prescribed, they may not work. In some cases, not taking them correctly can be harmful. To help ensure your treatment remains effective and safe, understand your medications and how to take them. Bring your medications to each visit for review by your provider.  Your lab results, notes, and after visit summary will be available on My Chart. We strongly encourage you to use this feature. If lab results are abnormal the clinic will contact you with the appropriate steps. If the clinic does not contact you assume the results are satisfactory. You can always view your results on My Chart. If you have questions regarding your health or results, please contact the clinic during office hours. You can also ask questions on My Chart.  We at [facility] are grateful that you chose us  to provide your care. We strive to provide evidence-based and compassionate care and are always looking for feedback. If you get a survey from the clinic please complete this so we can hear your opinions.  Diabetes Mellitus and Exercise Regular exercise is important for your health, especially if you have diabetes mellitus. Exercise is not just about losing weight. It can also help you increase muscle strength and bone density and reduce body fat and stress. This can help your level of endurance and make you more fit and flexible. Why should I exercise if I have diabetes? Exercise has many benefits for people with diabetes. It can: Help lower and  control your blood sugar (glucose). Help your body respond better and become more sensitive to the hormone insulin. Reduce how much insulin your body needs. Lower your risk for heart disease by: Lowering how much bad cholesterol and triglycerides you have in your body. Increasing how much good cholesterol you have in your body. Lowering your blood pressure. Lowering your blood glucose levels. What is my activity plan? Your health care provider or an expert trained in diabetes care (certified diabetes educator) can help you make an activity plan. This plan can help you find the type of exercise that works for you. It may also tell you how often to exercise and for how long. Be sure to: Get at least 150 minutes of medium-intensity or high-intensity exercise each week. This may involve brisk walking, biking, or water aerobics. Do stretching and strengthening exercises at least 2 times a week. This may involve yoga or weight lifting. Spread out your activity over at least 3 days of the week. Get some form of physical activity each day. Do not go more than 2 days in a row without some kind of activity. Avoid being inactive for more than 30 minutes at a time. Take frequent breaks to walk or stretch. Choose activities that you enjoy. Set goals that you know you can accomplish. Start slowly and increase the intensity of your exercise over time. How do I manage my diabetes during exercise?  Monitor your blood glucose Check your blood glucose before and after you exercise. If your blood glucose is 240 mg/dL (86.6 mmol/L) or higher before you  exercise, check your urine for ketones. These are chemicals created by the liver. If you have ketones in your urine, do not exercise until your blood glucose returns to normal. If your blood glucose is 100 mg/dL (5.6 mmol/L) or lower, eat a snack that has 15-20 grams of carbohydrate in it. Check your blood glucose 15 minutes after the snack to make sure that  your level is above 100 mg/dL (5.6 mmol/L) before you start to exercise. Your risk for low blood glucose (hypoglycemia) goes up during and after exercise. Know the symptoms of this condition and how to treat it. Follow these instructions at home: Keep a carbohydrate snack on hand for use before, during, and after exercise. This can help prevent or treat hypoglycemia. Avoid injecting insulin into parts of your body that are going to be used during exercise. This may include: Your arms, when you are going to play tennis. Your legs, when you are about to go jogging. Keep track of your exercise habits. This can help you and your health care provider watch and adjust your activity plan. Write down: What you eat before and after you exercise. Blood glucose levels before and after you exercise. The type and amount of exercise you do. Talk to your health care provider before you start a new activity. They may need to: Make sure that the activity is safe for you. Adjust your insulin, other medicines, and food that you eat. Drink water while you exercise. This can stop you from losing too much water (dehydration). It can also prevent problems caused by having a lot of heat in your body (heat stroke). Where to find more information American Diabetes Association: diabetes.org Association of Diabetes Care & Education Specialists: diabeteseducator.org This information is not intended to replace advice given to you by your health care provider. Make sure you discuss any questions you have with your health care provider. Document Revised: 08/11/2021 Document Reviewed: 08/11/2021 Elsevier Patient Education  2024 [employer].

## 2024-02-06 ENCOUNTER — Ambulatory Visit: Admitting: Nurse Practitioner

## 2024-02-06 ENCOUNTER — Encounter: Payer: Self-pay | Admitting: Nurse Practitioner

## 2024-02-06 VITALS — BP 132/82 | HR 87 | Temp 97.9°F | Resp 15 | Ht 64.02 in | Wt 119.8 lb

## 2024-02-06 DIAGNOSIS — E1169 Type 2 diabetes mellitus with other specified complication: Secondary | ICD-10-CM

## 2024-02-06 DIAGNOSIS — I152 Hypertension secondary to endocrine disorders: Secondary | ICD-10-CM

## 2024-02-06 DIAGNOSIS — F5104 Psychophysiologic insomnia: Secondary | ICD-10-CM

## 2024-02-06 DIAGNOSIS — E119 Type 2 diabetes mellitus without complications: Secondary | ICD-10-CM | POA: Diagnosis not present

## 2024-02-06 DIAGNOSIS — R809 Proteinuria, unspecified: Secondary | ICD-10-CM

## 2024-02-06 DIAGNOSIS — E785 Hyperlipidemia, unspecified: Secondary | ICD-10-CM

## 2024-02-06 DIAGNOSIS — Z7985 Long-term (current) use of injectable non-insulin antidiabetic drugs: Secondary | ICD-10-CM

## 2024-02-06 DIAGNOSIS — E1159 Type 2 diabetes mellitus with other circulatory complications: Secondary | ICD-10-CM | POA: Diagnosis not present

## 2024-02-06 DIAGNOSIS — E1129 Type 2 diabetes mellitus with other diabetic kidney complication: Secondary | ICD-10-CM

## 2024-02-06 DIAGNOSIS — E1142 Type 2 diabetes mellitus with diabetic polyneuropathy: Secondary | ICD-10-CM

## 2024-02-06 DIAGNOSIS — Z23 Encounter for immunization: Secondary | ICD-10-CM

## 2024-02-06 DIAGNOSIS — Z1231 Encounter for screening mammogram for malignant neoplasm of breast: Secondary | ICD-10-CM

## 2024-02-06 LAB — BAYER DCA HB A1C WAIVED: HB A1C (BAYER DCA - WAIVED): 5.1 % (ref 4.8–5.6)

## 2024-02-06 MED ORDER — GABAPENTIN 300 MG PO CAPS: 600.0000 mg | ORAL_CAPSULE | Freq: Every day | ORAL | 4 refills | Status: AC

## 2024-02-06 MED ORDER — METFORMIN HCL 500 MG PO TABS: 1000.0000 mg | ORAL_TABLET | Freq: Two times a day (BID) | ORAL | 3 refills | Status: AC

## 2024-02-06 NOTE — Assessment & Plan Note (Addendum)
 Chronic, ongoing with A1c 5.1% today remaining well below goal.  Has lost a significant amount of weight, which discussed with patient again today and recommend we work on some gain to a healthy weight level.  Continue every 14 day Ozempic  for now.  Goal is to not have to discontinue GLP1 as she did gain quite a bit of weight when this happened in past, but discussed with her we do not want her underweight either.  Continue Metformin  1000 MG daily and Jaridance.  Remain off Tresiba . If sugars start to trend under 70 often she is to alert PCP and cut back further on Metformin , discussed with her today.  Educated her on medications and side effects.  Urine ALB 150 February 2025, keep Telmisartan  on board for protection.  Continue Gabapentin  at night for neuropathy.  Return in 3 months. - Eye exam up to date, foot exam up to date. - Statin and ARB on board - Vaccinations up to date

## 2024-02-06 NOTE — Assessment & Plan Note (Signed)
 Chronic, ongoing.  Will continue current medication regimen and adjust as needed.  At present Trazodone  beneficial both to mood and sleep pattern.  Denies any SI/HI.

## 2024-02-06 NOTE — Assessment & Plan Note (Signed)
 Chronic, ongoing with A1c 5.1% today remaining well below goal.  Has lost a significant amount of weight, which discussed with patient again today and recommend we work on some gain to a healthy weight level.  Continue every 14 day Ozempic  for now.  Goal is to not have to discontinue GLP1 as she did gain quite a bit of weight when this happened in past, but discussed with her we do not want her underweight either.  Continue Metformin  1000 MG daily and Jaridance.  Remain off Tresiba . If sugars start to trend under 70 often she is to alert PCP and cut back further on Metformin , discussed with her today.  Educated her on medications and side effects.  Urine ALB 150 February 2025, keep Telmisartan  on board for protection.  Continue Gabapentin  at night for neuropathy.  Return in 3 months. - Eye exam up to date, foot exam up to date. - Statin and ARB on board - Vaccinations up to date

## 2024-02-06 NOTE — Assessment & Plan Note (Signed)
 Chronic, stable.  BP stable today on recheck. Continue current medication regimen at this time and adjust as needed.  Telmisartan  offering kidney protection.  Recommend she check BP at least 3 times a week at home + focus on DASH diet.  Continue to monitor kidney function and if GFR < 45 consider reduction Metformin .  Urine ALB 150 February 2025  LABS: CMP.  If elevations BP in future increase Amlodipine  to 10 MG.

## 2024-02-06 NOTE — Assessment & Plan Note (Signed)
 Chronic, ongoing.  Continue current medication regimen and adjust as needed based on labs.  Lipid panel today.

## 2024-02-06 NOTE — Assessment & Plan Note (Signed)
 Refer to diabetes with proteinuria plan of care.

## 2024-02-06 NOTE — Progress Notes (Signed)
 BP 132/82 (BP Location: Left Arm, Patient Position: Sitting, Cuff Size: Normal)   Pulse 87   Temp 97.9 F (36.6 C) (Oral)   Resp 15   Ht 5' 4.02 (1.626 m)   Wt 119 lb 12.8 oz (54.3 kg)   LMP  (LMP Unknown)   SpO2 98%   BMI 20.55 kg/m    Subjective:    [name], female    DOB: 1955/11/06, 68 y.o.   MRN: [id number]  HPI: April Johnson is a 68 y.o. female  Chief Complaint  Patient presents with   Diabetes    April Johnson 3 daily use. Currently 101.    HTN/HLD   Insomnia    Wakes up a lot at night still. Trazodone  does help but still not completley resolved.    DIABETES April Johnson A1c 5.1%. Taking Metformin  1000 MG daily and Jardiance  25 MG.  Was to have reduced Ozempic  to 0.5 MG due to significant weight loss, which was discussed with patient. She reports that this caused her to start to gain weight and sugars trended up, so she went back to 1 MG every 14 days. Last injection 02/03/24. Jardiance  and Ozempic  via assistance, however will lose Ozempic  assistance in new year. No further weight loss at this time, but remains underweight.  She is very concerned about gaining weight too much again. Takes Gabapentin  600 MG at night for neuropathy, higher doses made her too sleepy in the past.  Freestyle over past 90 days: In range 100% of the time, one low glucose event. Average glucose 103. Hypoglycemic episodes:no Polydipsia/polyuria: no Visual disturbance: no Chest pain: no Paresthesias: no Glucose Monitoring: yes             Accucheck frequency: daily -- Freestyle as above             Fasting glucose:              Post prandial:             Evening:              Before meals: Taking Insulin ?: yes             Long acting insulin : none             Short acting insulin : Blood Pressure Monitoring: occasionally Retinal Examination: Up To Date Foot Exam: Up to Date Pneumovax: Up to Date Influenza: Up to Date Aspirin: yes    HYPERTENSION / HYPERLIPIDEMIA   Continues Amlodipine , ASA, Lipitor, Metoprolol , and Micardis .   Satisfied with current treatment? yes Duration of hypertension: chronic BP monitoring frequency: daily BP range:  BP medication side effects: no Duration of hyperlipidemia: chronic Cholesterol medication side effects: no Cholesterol supplements: none Medication compliance: good compliance Aspirin: yes Recent stressors: no Recurrent headaches: no Visual changes: no Palpitations: no Dyspnea: no Chest pain: no Lower extremity edema: no Dizzy/lightheaded: no, not like she did when she was overweight  Has Trazodone  to take at night for sleep, has been taking this since loss of her husband >3 years ago.      02/06/2024    2:14 PM 10/16/2023   10:59 AM 04/13/2023   11:17 AM 01/11/2023   11:29 AM 10/11/2022    1:13 PM  Depression screen PHQ 2/9  Decreased Interest 0 0 0 0 0  Down, Depressed, Hopeless 0 0 0 0 0  PHQ - 2 Score 0 0 0 0 0  Altered sleeping 1 0 2 2 2  Tired, decreased energy 1 0 0 1 0  Change in appetite 1 0 0 1 0  Feeling bad or failure about yourself  0 0 0 0 0  Trouble concentrating 0 0 0 0 0  Moving slowly or fidgety/restless 0 0 0 0 0  Suicidal thoughts 0 0 0 0 0  PHQ-9 Score 3 0  2  4  2    Difficult doing work/chores  Not difficult at all Not difficult at all  Not difficult at all     Data saved with a previous flowsheet row definition       02/06/2024    2:14 PM 04/13/2023   11:17 AM 01/11/2023   11:29 AM 10/11/2022    1:13 PM  GAD 7 : Generalized Anxiety Score  Nervous, Anxious, on Edge 0 0 0 0  Control/stop worrying 0 0 0 1  Worry too much - different things 0 0 0 0  Trouble relaxing 0 0 1 0  Restless 0 0 0 0  Easily annoyed or irritable 0 0 1 0  Afraid - awful might happen 0 0 0 0  Total GAD 7 Score 0 0 2 1  Anxiety Difficulty  Not difficult at all  Not difficult at all    Relevant past medical, surgical, family and social history reviewed and updated as indicated. Interim medical history  since our last visit reviewed. Allergies and medications reviewed and updated.  Review of Systems  Constitutional:  Negative for activity change, appetite change, diaphoresis, fatigue and fever.  Respiratory:  Negative for cough, chest tightness and shortness of breath.   Cardiovascular:  Negative for chest pain, palpitations and leg swelling.  Gastrointestinal: Negative.   Endocrine: Negative for polydipsia, polyphagia and polyuria.  Neurological: Negative.   Psychiatric/Behavioral: Negative.     Per HPI unless specifically indicated above     Objective:    BP 132/82 (BP Location: Left Arm, Patient Position: Sitting, Cuff Size: Normal)   Pulse 87   Temp 97.9 F (36.6 C) (Oral)   Resp 15   Ht 5' 4.02 (1.626 m)   Wt 119 lb 12.8 oz (54.3 kg)   LMP  (LMP Unknown)   SpO2 98%   BMI 20.55 kg/m   Wt Readings from Last 3 Encounters:  02/06/24 119 lb 12.8 oz (54.3 kg)  10/16/23 119 lb 9.6 oz (54.3 kg)  07/12/23 123 lb 3.2 oz (55.9 kg)    Physical Exam Vitals and nursing note reviewed.  Constitutional:      General: She is awake. She is not in acute distress.    Appearance: She is well-developed, well-groomed and underweight. She is not ill-appearing or toxic-appearing.  HENT:     Head: Normocephalic.     Right Ear: Hearing normal.     Left Ear: Hearing normal.  Eyes:     General: Lids are normal.        Right eye: No discharge.        Left eye: No discharge.     Conjunctiva/sclera: Conjunctivae normal.     Pupils: Pupils are equal, round, and reactive to light.  Neck:     Thyroid : No thyromegaly.     Vascular: No carotid bruit.  Cardiovascular:     Rate and Rhythm: Normal rate and regular rhythm.     Heart sounds: Normal heart sounds. No murmur heard.    No gallop.  Pulmonary:     Effort: Pulmonary effort is normal.     Breath sounds: Normal breath  sounds.  Abdominal:     General: Bowel sounds are normal.     Palpations: Abdomen is soft.  Musculoskeletal:      Cervical back: Normal range of motion and neck supple.     Right lower leg: No edema.     Left lower leg: No edema.  Skin:    General: Skin is warm and dry.  Neurological:     Mental Status: She is alert and oriented to person, place, and time.  Psychiatric:        Attention and Perception: Attention normal.        Mood and Affect: Mood normal.        Speech: Speech normal.        Behavior: Behavior normal. Behavior is cooperative.        Thought Content: Thought content normal.    Results for orders placed or performed in visit on 10/16/23  Bayer DCA Hb A1c Waived   Collection Time: 10/16/23 10:57 AM  Result Value Ref Range   HB A1C (BAYER DCA - WAIVED) 5.1 4.8 - 5.6 %  Comprehensive metabolic panel with GFR   Collection Time: 10/16/23 10:58 AM  Result Value Ref Range   Glucose 96 70 - 99 mg/dL   BUN 16 8 - 27 mg/dL   Creatinine, Ser 9.29 0.57 - 1.00 mg/dL   eGFR 95 >40 fO/fpw/8.26   BUN/Creatinine Ratio 23 12 - 28   Sodium 139 134 - 144 mmol/L   Potassium 3.8 3.5 - 5.2 mmol/L   Chloride 98 96 - 106 mmol/L   CO2 22 20 - 29 mmol/L   Calcium  10.3 8.7 - 10.3 mg/dL   Total Protein 6.8 6.0 - 8.5 g/dL   Albumin 4.8 3.9 - 4.9 g/dL   Globulin, Total 2.0 1.5 - 4.5 g/dL   Bilirubin Total 1.0 0.0 - 1.2 mg/dL   Alkaline Phosphatase 81 44 - 121 IU/L   AST 16 0 - 40 IU/L   ALT 15 0 - 32 IU/L  Lipid Panel w/o Chol/HDL Ratio   Collection Time: 10/16/23 10:58 AM  Result Value Ref Range   Cholesterol, Total 100 100 - 199 mg/dL   Triglycerides 849 (H) 0 - 149 mg/dL   HDL 45 >60 mg/dL   VLDL Cholesterol Cal 25 5 - 40 mg/dL   LDL Chol Calc (NIH) 30 0 - 99 mg/dL      Assessment & Plan:   Problem List Items Addressed This Visit       Cardiovascular and Mediastinum   Hypertension associated with diabetes (HCC)   Chronic, stable.  BP stable today on recheck. Continue current medication regimen at this time and adjust as needed.  Telmisartan  offering kidney protection.  Recommend she  check BP at least 3 times a week at home + focus on DASH diet.  Continue to monitor kidney function and if GFR < 45 consider reduction Metformin .  Urine ALB 150 February 2025  LABS: CMP.  If elevations BP in future increase Amlodipine  to 10 MG.       Relevant Medications   metFORMIN  (GLUCOPHAGE ) 500 MG tablet   Other Relevant Orders   Bayer DCA Hb A1c Waived     Endocrine   Type 2 diabetes mellitus with proteinuria (HCC) - Primary   Chronic, ongoing with A1c 5.1% today remaining well below goal.  Has lost a significant amount of weight, which discussed with patient again today and recommend we work on some gain to a healthy weight level.  Continue every 14 day Ozempic  for now.  Goal is to not have to discontinue GLP1 as she did gain quite a bit of weight when this happened in past, but discussed with her we do not want her underweight either.  Continue Metformin  1000 MG daily and Jaridance.  Remain off Tresiba . If sugars start to trend under 70 often she is to alert PCP and cut back further on Metformin , discussed with her today.  Educated her on medications and side effects.  Urine ALB 150 February 2025, keep Telmisartan  on board for protection.  Continue Gabapentin  at night for neuropathy.  Return in 3 months. - Eye exam up to date, foot exam up to date. - Statin and ARB on board - Vaccinations up to date      Relevant Medications   metFORMIN  (GLUCOPHAGE ) 500 MG tablet   Other Relevant Orders   Bayer DCA Hb A1c Waived   Hyperlipidemia associated with type 2 diabetes mellitus (HCC)   Chronic, ongoing.  Continue current medication regimen and adjust as needed based on labs. Lipid panel today.      Relevant Medications   metFORMIN  (GLUCOPHAGE ) 500 MG tablet   Other Relevant Orders   Bayer DCA Hb A1c Waived   Comprehensive metabolic panel with GFR   Lipid Panel w/o Chol/HDL Ratio   Diabetic peripheral neuropathy (HCC)   Chronic, ongoing with A1c 5.1% today remaining well below goal.  Has  lost a significant amount of weight, which discussed with patient again today and recommend we work on some gain to a healthy weight level.  Continue every 14 day Ozempic  for now.  Goal is to not have to discontinue GLP1 as she did gain quite a bit of weight when this happened in past, but discussed with her we do not want her underweight either.  Continue Metformin  1000 MG daily and Jaridance.  Remain off Tresiba . If sugars start to trend under 70 often she is to alert PCP and cut back further on Metformin , discussed with her today.  Educated her on medications and side effects.  Urine ALB 150 February 2025, keep Telmisartan  on board for protection.  Continue Gabapentin  at night for neuropathy.  Return in 3 months. - Eye exam up to date, foot exam up to date. - Statin and ARB on board - Vaccinations up to date      Relevant Medications   gabapentin  (NEURONTIN ) 300 MG capsule   metFORMIN  (GLUCOPHAGE ) 500 MG tablet   Other Relevant Orders   Bayer DCA Hb A1c Waived   Diabetes mellitus treated with injections of non-insulin  medication (HCC)   Refer to diabetes with proteinuria plan of care.      Relevant Medications   metFORMIN  (GLUCOPHAGE ) 500 MG tablet   Other Relevant Orders   Bayer DCA Hb A1c Waived     Other   Insomnia   Chronic, ongoing.  Will continue current medication regimen and adjust as needed.  At present Trazodone  beneficial both to mood and sleep pattern.  Denies any SI/HI.       Other Visit Diagnoses       Encounter for screening mammogram for malignant neoplasm of breast       Mammogram ordered and instructed how to schedule.   Relevant Orders   MM 3D SCREENING MAMMOGRAM BILATERAL BREAST     Flu vaccine need       Flu vaccine today, educated patient.   Relevant Orders   Flu vaccine HIGH DOSE PF(Fluzone Trivalent)  Follow up plan: Return in about 3 months (around 05/06/2024) for T2DM, HTN/HLD, INSOMNIA.

## 2024-02-07 ENCOUNTER — Ambulatory Visit: Payer: Self-pay | Admitting: Nurse Practitioner

## 2024-02-07 LAB — LIPID PANEL W/O CHOL/HDL RATIO

## 2024-02-07 NOTE — Progress Notes (Signed)
 Contacted via MyChart  Good morning April Johnson, your labs are starting to return, just waiting on lipid panel. - Kidney function, creatinine and eGFR, remains normal, as is liver function, AST and ALT. Any questions? Keep being stellar!!  Thank you for allowing me to participate in your care.  I appreciate you. Kindest regards, Anjanette Gilkey

## 2024-02-08 LAB — COMPREHENSIVE METABOLIC PANEL WITH GFR
ALT: 11 IU/L (ref 0–32)
AST: 18 IU/L (ref 0–40)
Albumin: 4.4 g/dL (ref 3.9–4.9)
Alkaline Phosphatase: 72 IU/L (ref 49–135)
BUN/Creatinine Ratio: 28 (ref 12–28)
BUN: 16 mg/dL (ref 8–27)
Bilirubin Total: 0.8 mg/dL (ref 0.0–1.2)
CO2: 21 mmol/L (ref 20–29)
Calcium: 10.2 mg/dL (ref 8.7–10.3)
Chloride: 98 mmol/L (ref 96–106)
Creatinine, Ser: 0.58 mg/dL (ref 0.57–1.00)
Globulin, Total: 1.9 g/dL (ref 1.5–4.5)
Glucose: 93 mg/dL (ref 70–99)
Potassium: 4.4 mmol/L (ref 3.5–5.2)
Sodium: 139 mmol/L (ref 134–144)
Total Protein: 6.3 g/dL (ref 6.0–8.5)
eGFR: 99 mL/min/1.73 (ref >59)

## 2024-02-08 LAB — LIPID PANEL W/O CHOL/HDL RATIO
Cholesterol, Total: 107 mg/dL (ref 100–199)
HDL: 57 mg/dL (ref >39)
LDL Chol Calc (NIH): 27 mg/dL (ref 0–99)
Triglycerides: 138 mg/dL (ref 0–149)
VLDL Cholesterol Cal: 23 mg/dL (ref 5–40)

## 2024-02-13 ENCOUNTER — Other Ambulatory Visit: Payer: Self-pay | Admitting: Nurse Practitioner

## 2024-02-15 NOTE — Telephone Encounter (Signed)
 Received pt portion pap [facility] (Jardiance ) but pt did not send proof of income, left HIPAA VM.

## 2024-02-15 NOTE — Telephone Encounter (Signed)
 Too soon for refill, LRF 05/02/23 FOR 90/3 RF.  Requested Prescriptions  Pending Prescriptions Disp Refills   JARDIANCE  25 MG TABS tablet [Pharmacy Med Name: JARDIANCE  25MG  TAB] 90 tablet 3    Sig: TAKE ONE TABLET BY MOUTH DAILY     Endocrinology:  Diabetes - SGLT2 Inhibitors Passed - 02/15/2024 10:53 AM      Passed - Cr in normal range and within 360 days    Creatinine, Ser  Date Value Ref Range Status  02/06/2024 0.58 0.57 - 1.00 mg/dL Final         Passed - HBA1C is between 0 and 7.9 and within 180 days    Hemoglobin A1C  Date Value Ref Range Status  10/27/2015 7.3%  Final   HB A1C (BAYER DCA - WAIVED)  Date Value Ref Range Status  02/06/2024 5.1 4.8 - 5.6 % Final    Comment:             Prediabetes: 5.7 - 6.4          Diabetes: >6.4          Glycemic control for adults with diabetes: <7.0          Passed - eGFR in normal range and within 360 days    GFR calc Af Amer  Date Value Ref Range Status  04/01/2020 95 >59 mL/min/1.73 Final    Comment:    **In accordance with recommendations from the NKF-ASN Task force,**   [facility] is in the process of updating its eGFR calculation to the   2021 CKD-EPI creatinine equation that estimates kidney function   without a race variable.    GFR calc non Af Amer  Date Value Ref Range Status  04/01/2020 82 >59 mL/min/1.73 Final   eGFR  Date Value Ref Range Status  02/06/2024 99 >59 mL/min/1.73 Final         Passed - Valid encounter within last 6 months    Recent Outpatient Visits           1 week ago Type 2 diabetes mellitus with proteinuria (HCC)   [place] [facility] [place], [place] T, NP   4 months ago Type 2 diabetes mellitus with proteinuria (HCC)   [place] [facility] [place], [place] T, NP   7 months ago Type 2 diabetes mellitus with proteinuria (HCC)   [place] [facility] [place], [place] T, NP   10 months ago Type 2 diabetes mellitus with proteinuria [facility])   [facility] [place], Melanie DASEN, NP

## 2024-02-20 ENCOUNTER — Ambulatory Visit (INDEPENDENT_AMBULATORY_CARE_PROVIDER_SITE_OTHER): Admitting: Nurse Practitioner

## 2024-02-20 ENCOUNTER — Telehealth: Payer: Self-pay

## 2024-02-20 ENCOUNTER — Encounter: Payer: Self-pay | Admitting: Nurse Practitioner

## 2024-02-20 VITALS — BP 136/72 | HR 83 | Temp 97.3°F | Resp 15 | Ht 64.02 in | Wt 123.8 lb

## 2024-02-20 DIAGNOSIS — R399 Unspecified symptoms and signs involving the genitourinary system: Secondary | ICD-10-CM | POA: Insufficient documentation

## 2024-02-20 LAB — URINALYSIS, ROUTINE W REFLEX MICROSCOPIC
Bilirubin, UA: NEGATIVE
Ketones, UA: NEGATIVE
Leukocytes,UA: NEGATIVE
Nitrite, UA: NEGATIVE
Protein,UA: NEGATIVE
RBC, UA: NEGATIVE
Specific Gravity, UA: 1.01 (ref 1.005–1.030)
Urobilinogen, Ur: 1 mg/dL (ref 0.2–1.0)
pH, UA: 6.5 (ref 5.0–7.5)

## 2024-02-20 LAB — WET PREP FOR TRICH, YEAST, CLUE
Clue Cell Exam: NEGATIVE
Trichomonas Exam: NEGATIVE
Yeast Exam: NEGATIVE

## 2024-02-20 NOTE — Assessment & Plan Note (Signed)
 Acute, but improving with anti itch pads. Wet prep and UA negative today with exception of 2+ glucose. Monitor closely with Jardiance  use. Can continue anti itch pads at home. Return if any symptoms worsen.

## 2024-02-20 NOTE — Telephone Encounter (Signed)
 Copied from CRM #[id number]. Topic: Clinical - Medication Question >> Feb 19, 2024  3:44 PM Myrick T wrote: Reason for CRM: patient called stated she feels like she is getting a UTI. She has some itching and burning in the vaginal area with pressure in lower abdomen and frequent urination. Patient request provider send in something for her symptoms

## 2024-02-20 NOTE — Progress Notes (Signed)
 BP 136/72 (BP Location: Left Arm, Patient Position: Sitting, Cuff Size: Normal)   Pulse 83   Temp (!) 97.3 F (36.3 C) (Oral)   Resp 15   Ht 5' 4.02 (1.626 m)   Wt 123 lb 12.8 oz (56.2 kg)   LMP  (LMP Unknown)   SpO2 99%   BMI 21.24 kg/m    Subjective:    [name], female    DOB: 10-26-1955, 68 y.o.   MRN: [id number]  HPI: April Johnson is a 68 y.o. female  Chief Complaint  Patient presents with   Urinary Frequency   URINARY SYMPTOMS Started to have symptoms over the weekend. Cleaned with anti itch wipes today and that helped. Stopped vanilla creamer which has caused yeast in past. Dysuria: was but not anymore Urinary frequency: yes Urgency: yes Small volume voids: no Symptom severity: yes Urinary incontinence: occasional and wears panty liner Foul odor: no Hematuria: no Abdominal pain: no Back pain: no Suprapubic pain/pressure: initially, but improved Flank pain: no Fever:  no Vomiting: no Status: better Previous urinary tract infection: yes Recurrent urinary tract infection: no Sexual activity: No sexually active History of sexually transmitted disease: no Treatments attempted: increasing fluids    Relevant past medical, surgical, family and social history reviewed and updated as indicated. Interim medical history since our last visit reviewed. Allergies and medications reviewed and updated.  Review of Systems  Constitutional:  Negative for activity change, appetite change, diaphoresis, fatigue and fever.  Respiratory:  Negative for cough, chest tightness, shortness of breath and wheezing.   Cardiovascular:  Negative for chest pain, palpitations and leg swelling.  Gastrointestinal: Negative.   Genitourinary:  Positive for dysuria (improved), frequency and urgency. Negative for decreased urine volume, flank pain, hematuria and vaginal discharge.  Neurological: Negative.   Psychiatric/Behavioral: Negative.     Per HPI unless  specifically indicated above     Objective:    BP 136/72 (BP Location: Left Arm, Patient Position: Sitting, Cuff Size: Normal)   Pulse 83   Temp (!) 97.3 F (36.3 C) (Oral)   Resp 15   Ht 5' 4.02 (1.626 m)   Wt 123 lb 12.8 oz (56.2 kg)   LMP  (LMP Unknown)   SpO2 99%   BMI 21.24 kg/m   Wt Readings from Last 3 Encounters:  02/20/24 123 lb 12.8 oz (56.2 kg)  02/06/24 119 lb 12.8 oz (54.3 kg)  10/16/23 119 lb 9.6 oz (54.3 kg)    Physical Exam Vitals and nursing note reviewed.  Constitutional:      General: She is awake. She is not in acute distress.    Appearance: She is well-developed, well-groomed and underweight. She is not ill-appearing or toxic-appearing.  HENT:     Head: Normocephalic.     Right Ear: Hearing and external ear normal.     Left Ear: Hearing and external ear normal.  Eyes:     General: Lids are normal.        Right eye: No discharge.        Left eye: No discharge.     Conjunctiva/sclera: Conjunctivae normal.     Pupils: Pupils are equal, round, and reactive to light.  Neck:     Thyroid : No thyromegaly.     Vascular: No carotid bruit.  Cardiovascular:     Rate and Rhythm: Normal rate and regular rhythm.     Heart sounds: Normal heart sounds. No murmur heard.    No gallop.  Pulmonary:  Effort: Pulmonary effort is normal. No accessory muscle usage or respiratory distress.     Breath sounds: Normal breath sounds.  Abdominal:     General: Bowel sounds are normal. There is no distension.     Palpations: Abdomen is soft.     Tenderness: There is no abdominal tenderness. There is no right CVA tenderness or left CVA tenderness.  Musculoskeletal:     Cervical back: Normal range of motion and neck supple.     Right lower leg: No edema.     Left lower leg: No edema.  Lymphadenopathy:     Cervical: No cervical adenopathy.  Skin:    General: Skin is warm and dry.  Neurological:     Mental Status: She is alert and oriented to person, place, and time.      Deep Tendon Reflexes: Reflexes are normal and symmetric.     Reflex Scores:      Brachioradialis reflexes are 2+ on the right side and 2+ on the left side.      Patellar reflexes are 2+ on the right side and 2+ on the left side. Psychiatric:        Attention and Perception: Attention normal.        Mood and Affect: Mood normal.        Speech: Speech normal.        Behavior: Behavior normal. Behavior is cooperative.        Thought Content: Thought content normal.    Results for orders placed or performed in visit on 02/06/24  Bayer DCA Hb A1c Waived   Collection Time: 02/06/24  2:24 PM  Result Value Ref Range   HB A1C (BAYER DCA - WAIVED) 5.1 4.8 - 5.6 %  Comprehensive metabolic panel with GFR   Collection Time: 02/06/24  2:26 PM  Result Value Ref Range   Glucose 93 70 - 99 mg/dL   BUN 16 8 - 27 mg/dL   Creatinine, Ser 9.41 0.57 - 1.00 mg/dL   eGFR 99 >40 fO/fpw/8.26   BUN/Creatinine Ratio 28 12 - 28   Sodium 139 134 - 144 mmol/L   Potassium 4.4 3.5 - 5.2 mmol/L   Chloride 98 96 - 106 mmol/L   CO2 21 20 - 29 mmol/L   Calcium  10.2 8.7 - 10.3 mg/dL   Total Protein 6.3 6.0 - 8.5 g/dL   Albumin 4.4 3.9 - 4.9 g/dL   Globulin, Total 1.9 1.5 - 4.5 g/dL   Bilirubin Total 0.8 0.0 - 1.2 mg/dL   Alkaline Phosphatase 72 49 - 135 IU/L   AST 18 0 - 40 IU/L   ALT 11 0 - 32 IU/L  Lipid Panel w/o Chol/HDL Ratio   Collection Time: 02/06/24  2:26 PM  Result Value Ref Range   Cholesterol, Total 107 100 - 199 mg/dL   Triglycerides 861 0 - 149 mg/dL   HDL 57 >60 mg/dL   VLDL Cholesterol Cal 23 5 - 40 mg/dL   LDL Chol Calc (NIH) 27 0 - 99 mg/dL      Assessment & Plan:   Problem List Items Addressed This Visit       Other   Urinary symptom or sign - Primary   Acute, but improving with anti itch pads. Wet prep and UA negative today with exception of 2+ glucose. Monitor closely with Jardiance  use. Can continue anti itch pads at home. Return if any symptoms worsen.      Relevant Orders    Urinalysis, Routine w  reflex microscopic   WET PREP FOR TRICH, YEAST, CLUE     Follow up plan: Return if symptoms worsen or fail to improve.

## 2024-02-20 NOTE — Patient Instructions (Signed)
 Urinary Incontinence: What to Know Urinary incontinence, or UI, happens if you can't always control when you pee. If you think you have UI, it's important to talk to your health care provider. There're things that can be done to help treat your problem. What are the different types of UI? There are many types of UI. You may have more than one type: Stress UI. You pee all of a sudden when doing activities such exercising. You may also pee when you cough or laugh. Urge UI. You have a sudden urge to pee and you begin peeing before you get to a bathroom. Overflow UI. You feel the need to pee often but only pee a small amount. You also always leak urine. Functional UI. You pee when you can't get to the bathroom in time due to: A physical problem like a bladder injury or arthritis. A disease that affects the way your brain works, like Alzheimer's disease. Nocturnal UI. You pee while you sleep. What are the causes?     Medicines. Infections. Trouble pooping (constipation). Bladder muscles that squeeze too much or too soon (overactive bladder). Weak bladder muscles. Weak pelvic floor muscles. These muscles provide support for the bladder, intestines, and the uterus. Enlarged prostate in males. The prostate sits around the tube that drains pee from the bladder (urethra). If the prostate gets big, it can pinch the urethra. If the urethra is blocked, the bladder gets weak and can't empty all the way. Surgery. Diseases that affect the nerves or spinal cord. What puts you at risk for UI? Age. The older you are, the higher the risk. Being overweight or having obesity. Being inactive and not getting much exercise. Pregnancy and childbirth. Menopause. Long-term coughing. Treatment for prostate cancer. How is UI diagnosed? A physical exam. Pee tests. X-rays of your kidneys or bladder. Ultrasound or CT scan. Cystoscopy. In this procedure, a provider inserts a tube with a light and camera  (cystoscope) through the urethra and into the bladder to check for problems. Urodynamic testing. These tests check how well the bladder can store and release pee. Your provider may ask you to write down your symptoms. You may be asked to write when you pee and how much you pee. Where to find more information [employer] of Diabetes and Digestive and Kidney Diseases: CarFli[employer]tn American Urology Association: www.urologyhealth.org This information is not intended to replace advice given to you by your health care provider. Make sure you discuss any questions you have with your health care provider. Document Revised: 01/25/2023 Document Reviewed: 10/28/2022 Elsevier Patient Education  2024 [employer].

## 2024-02-20 NOTE — Telephone Encounter (Signed)
 Called and scheduled patient an appointment for evaluation this afternoon.

## 2024-02-28 ENCOUNTER — Other Ambulatory Visit: Payer: Self-pay | Admitting: Nurse Practitioner

## 2024-03-05 ENCOUNTER — Ambulatory Visit: Payer: Self-pay

## 2024-03-05 NOTE — Telephone Encounter (Signed)
 First attempt to reach patient at 4:47. LM to return call to office.

## 2024-03-05 NOTE — Telephone Encounter (Signed)
 FYI Only or Action Required?: Action required by provider: request for appointment, clinical question for provider, and update on patient condition.  Patient was last seen in primary care on 02/20/2024 by Valerio Melanie DASEN, NP.  Called Nurse Triage reporting Urinary Frequency.  Symptoms began several weeks ago.  Interventions attempted: Rest, hydration, or home remedies.  Symptoms are: gradually worsening.  Triage Disposition: See Physician Within 24 Hours  Patient/caregiver understands and will follow disposition?: Yes   Message from Lauren C sent at 03/05/2024  4:26 PM EST  Reason for Triage: Pt was seen recently for urinary symptoms 12/16 that are now worsening. She has burning/itching down there and is now having pain with urination. Please return call at [phone]    Reason for Disposition  Unusual vaginal discharge (e.g., bad smelling, yellow, green, or foamy-white)  Answer Assessment - Initial Assessment Questions Pt returned call to clinic stating that urinary symptoms have not improved since office visit, 12/16. Pt states she has increased her water  intake but burning and pain still present with urination. Pt states she also has white, thick discharge at this time. Pt denies fever, flank pain or hematuria. Discussed no appts until next week but pt requested to drop off urine sample to office. Reassured her I would send in request. Recommended to continue taking in fluids, discussed sitz baths or peri bottle to dilute urine and recommended AZO cranberry for urinary relief. Pt voiced understanding.   1. SEVERITY: How bad is the pain?  (e.g., Scale 1-10; mild, moderate, or severe)     Only present with urination; mild   2. FREQUENCY: How many times have you had painful urination today?      Pain/ burning with urination every time   3. PATTERN: Is pain present every time you urinate or just sometimes?      Every time   4. ONSET: When did the painful urination  start?      Prior to visit 12/16  5. FEVER: Do you have a fever? If Yes, ask: What is your temperature, how was it measured, and when did it start?     No   6. PAST UTI: Have you had a urine infection before? If Yes, ask: When was the last time? and What happened that time?      Yes; several years ago   7. CAUSE: What do you think is causing the painful urination?  (e.g., UTI, scratch, Herpes sore)     UTI   8. OTHER SYMPTOMS: Do you have any other symptoms? (e.g., blood in urine, flank pain, genital sores, urgency, vaginal discharge)     Denies blood or flank pain; states she is having some white thick discharge  Protocols used: Urination Pain - Female-A-AH

## 2024-03-06 NOTE — Telephone Encounter (Signed)
 Returned call to patient to offer early am appointment however she feels she since really pushing fluids since her call that it has really helped her. She is aware our office is closed for the holiday but that other [place] practices may have openings if needed or she can head to [facility]. She understands and appreciates the call.

## 2024-03-13 NOTE — Progress Notes (Signed)
" ° °  03/14/24  [name], female   DOB: Dec 16, 1955, 69 y.o.   MRN: [id number]  Subjective/Objective Telephone visit to follow-up on management of diabetes   Diabetes Management Plan -Current medications: metformin  1000mg  BID with meals, Jardiance  25mg  daily (PAP), Ozempic  1mg  every 14 days  - Patient was able to fill and is now using Libre 3+ for CGM - FBG averaging 70-100 - Was receiving Ozempic  through [facility] PAP, but will not receive additional medication due to program changes in 2026.  She has 2 full pens on hand, which will last 4 more months.  Insurance test claims reflect $530 copay for 1st fill at this time; this would satisfy her deductible; and then her copay would go to $150/month.  She states this will not be affordable, but she believes she will have additional insurance coverage starting 2/1 that may help with copay -Does not endorse any s/sx of hypoglycemia -ACEi/ARB for cardiorenal protection:  telmisartan  80mg ; last OV BP was 136/72- this was after 1st reading of 158/84.  Patient does not check BP at home due to not having a monitor. -UACR >300 04/13/23 -Statin for ASCVD risk reduction:  atorvastatin  40mg  daily  Lab Results  Component Value Date   HGBA1C 5.1 02/06/2024   HGBA1C 5.1 10/16/2023   HGBA1C 5.2 07/12/2023      Component Value Date/Time   NA 139 02/06/2024 1426   K 4.4 02/06/2024 1426   CL 98 02/06/2024 1426   CO2 21 02/06/2024 1426   GLUCOSE 93 02/06/2024 1426   GLUCOSE 161 (H) 01/12/2019 1353   BUN 16 02/06/2024 1426   CREATININE 0.58 02/06/2024 1426   CALCIUM  10.2 02/06/2024 1426   PROT 6.3 02/06/2024 1426   ALBUMIN 4.4 02/06/2024 1426   AST 18 02/06/2024 1426   ALT 11 02/06/2024 1426   ALKPHOS 72 02/06/2024 1426   BILITOT 0.8 02/06/2024 1426   EGFR 99 02/06/2024 1426   GFRNONAA 82 04/01/2020 1050      Component Value Date/Time   CHOL 107 02/06/2024 1426   CHOL 130 02/15/2019 0923   TRIG 138 02/06/2024 1426   TRIG 261 (H)  02/15/2019 0923   HDL 57 02/06/2024 1426   CHOLHDL 3.2 01/24/2018 1030   VLDL 52 (H) 02/15/2019 0923   LDLCALC 27 02/06/2024 1426   LABVLDL 23 02/06/2024 1426    Assessment/Plan   Diabetes Management Plan -A1c, BP, and LDL at goal -UACR not at goal with SGLT2 and ARB on board- continue to monitor -Advised patient that [employer] Cares PAP is needing proof of income- she is going to send me a copy of her SSA benefit statement -If Ozempic  will not be affordable, patient should qualify for [employer] Cares PAP for Trulicity and is open to changing medications if needed based on affordability -Continue current regimen at this time since patient has 4 mo supply of Ozempic  on hand -Continue use of Libre 3+ for CGM; unclear if insurance will continue to cover since patient not using insulin  -Sees PCP again in March and wil be due for A1c, CMP, and UACR   Follow-up:  3 months  April Johnson, PharmD, DPLA  "

## 2024-03-14 ENCOUNTER — Other Ambulatory Visit: Payer: Self-pay

## 2024-03-14 DIAGNOSIS — I152 Hypertension secondary to endocrine disorders: Secondary | ICD-10-CM

## 2024-03-14 DIAGNOSIS — Z7985 Long-term (current) use of injectable non-insulin antidiabetic drugs: Secondary | ICD-10-CM

## 2024-03-14 DIAGNOSIS — E1159 Type 2 diabetes mellitus with other circulatory complications: Secondary | ICD-10-CM

## 2024-03-14 DIAGNOSIS — E1169 Type 2 diabetes mellitus with other specified complication: Secondary | ICD-10-CM

## 2024-03-14 DIAGNOSIS — E1129 Type 2 diabetes mellitus with other diabetic kidney complication: Secondary | ICD-10-CM

## 2024-04-01 NOTE — Telephone Encounter (Signed)
 Faxed pt and provider portion to [facility](Jardiance ) along Ins card,pt did not submit proof of income ,left HIPAA VM,

## 2024-04-02 NOTE — Telephone Encounter (Signed)
 Pt call back ,will bring in proof of income to provider office either tomorrow or once the weather is better.

## 2024-04-03 ENCOUNTER — Other Ambulatory Visit: Payer: Self-pay | Admitting: Nurse Practitioner

## 2024-04-03 DIAGNOSIS — E1129 Type 2 diabetes mellitus with other diabetic kidney complication: Secondary | ICD-10-CM

## 2024-04-03 MED ORDER — FREESTYLE LIBRE 3 PLUS SENSOR MISC: 3 refills | Status: DC

## 2024-04-03 NOTE — Telephone Encounter (Signed)
 Requested medication (s) are due for refill today: yes  Requested medication (s) are on the active medication list: yes  Last refill:  1022/25  Future visit scheduled: yes  Notes to clinic:   Patient Assistance Program stated they are still missing information for patient. They are requesting proof of household income   Callback #: [phone] Fax #: [phone]      Requested Prescriptions  Pending Prescriptions Disp Refills   Continuous Glucose Sensor (FREESTYLE LIBRE 3 PLUS SENSOR) MISC 6 each 3    Sig: Change sensor every 15 days.     There is no refill protocol information for this order

## 2024-04-03 NOTE — Telephone Encounter (Signed)
 Copied from CRM #[id number]. Topic: Clinical - Medication Refill >> Apr 03, 2024 11:16 AM Myrick T wrote: Patient says pharmacy says she can't get a refill until March but she only recvd 6 and they are good for 14 days so she has on her last sensor of which will run out in 4 days.  Medication: Continuous Glucose Sensor (FREESTYLE LIBRE 3 PLUS  Has the patient contacted their pharmacy? Yes  This is the patient's preferred pharmacy:  [id number] - [place], [place] - [address] [place] [address] Phone: [phone] Fax: [phone]  Is this the correct pharmacy for this prescription? Yes If no, delete pharmacy and type the correct one.   Has the prescription been filled recently? Yes  Is the patient out of the medication? No  Has the patient been seen for an appointment in the last year OR does the patient have an upcoming appointment? Yes  Can we respond through MyChart? Yes  Agent: Please be advised that Rx refills may take up to 3 business days. We ask that you follow-up with your pharmacy.

## 2024-04-03 NOTE — Telephone Encounter (Signed)
 Copied from CRM [id number]. Topic: General - Other >> Apr 02, 2024  4:54 PM Wess RAMAN wrote: Reason for CRM: Curlee from [employer] stated they are still missing information for patient. They are requesting proof of household income  Callback #: [phone] Fax #: [phone]

## 2024-04-04 ENCOUNTER — Ambulatory Visit
Admission: RE | Admit: 2024-04-04 | Discharge: 2024-04-04 | Disposition: A | Discharge status: Home / Self Care | Source: Ambulatory Visit | Attending: Nurse Practitioner | Admitting: Nurse Practitioner

## 2024-04-04 ENCOUNTER — Ambulatory Visit: Admitting: Emergency Medicine

## 2024-04-04 VITALS — Ht 64.5 in | Wt 120.0 lb

## 2024-04-04 DIAGNOSIS — Z1231 Encounter for screening mammogram for malignant neoplasm of breast: Secondary | ICD-10-CM | POA: Diagnosis present

## 2024-04-04 DIAGNOSIS — Z0000 Encounter for general adult medical examination without abnormal findings: Secondary | ICD-10-CM | POA: Diagnosis not present

## 2024-04-04 NOTE — Patient Instructions (Signed)
 April Johnson,  Thank you for taking the time for your Medicare Wellness Visit. I appreciate your continued commitment to your health goals. Please review the care plan we discussed, and feel free to reach out if I can assist you further.  Please note that Annual Wellness Visits do not include a physical exam. Some assessments may be limited, especially if the visit was conducted virtually. If needed, we may recommend an in-person follow-up with your provider.  Ongoing Care Seeing your primary care provider every 3 to 6 months helps us  monitor your health and provide consistent, personalized care.   Referrals If a referral was made during today's visit and you haven't received any updates within two weeks, please contact the referred provider directly to check on the status.  Recommended Screenings:  You may get the shingles vaccines at your local pharmacy at your convenience.  Keep up the good work!  Health Maintenance  Topic Date Due   Breast Cancer Screening  12/02/2022   Medicare Annual Wellness Visit  07/06/2023   COVID-19 Vaccine (3 - 2025-26 season) 11/06/2023   Kidney health urinalysis for diabetes  04/12/2024   Zoster (Shingles) Vaccine (1 of 2) 05/04/2024*   Eye exam for diabetics  06/04/2024   Complete foot exam   07/11/2024   Hemoglobin A1C  08/06/2024   Yearly kidney function blood test for diabetes  02/05/2025   Colon Cancer Screening  04/13/2025   Osteoporosis screening with Bone Density Scan  06/27/2027   DTaP/Tdap/Td vaccine (4 - Td or Tdap) 07/24/2029   Pneumococcal Vaccine for age over 86  Completed   Flu Shot  Completed   Hepatitis C Screening  Completed   Meningitis B Vaccine  Aged Out  *Topic was postponed. The date shown is not the original due date.       04/04/2024    8:14 AM  Advanced Directives  Does Patient Have a Medical Advance Directive? No  Would patient like information on creating a medical advance directive? No - Patient declined     Vision: Annual vision screenings are recommended for early detection of glaucoma, cataracts, and diabetic retinopathy. These exams can also reveal signs of chronic conditions such as diabetes and high blood pressure.  Dental: Annual dental screenings help detect early signs of oral cancer, gum disease, and other conditions linked to overall health, including heart disease and diabetes.  Please see the attached documents for additional preventive care recommendations.

## 2024-04-04 NOTE — Progress Notes (Signed)
 "  Chief Complaint  Patient presents with   Medicare Wellness     Subjective:   April Johnson is a 69 y.o. female who presents for a Medicare Annual Wellness Visit.  Visit info / Clinical Intake: Medicare Wellness Visit Type:: Subsequent Annual Wellness Visit Persons participating in visit and providing information:: patient Medicare Wellness Visit Mode:: Telephone If telephone:: video declined Since this visit was completed virtually, some vitals may be partially provided or unavailable. Missing vitals are due to the limitations of the virtual format.: Documented vitals are patient reported If Telephone or Video please confirm:: I connected with patient using audio/video enable telemedicine. I verified patient identity with two identifiers, discussed telehealth limitations, and patient agreed to proceed. Patient Location:: home Provider Location:: clinic office Interpreter Needed?: No Pre-visit prep was completed: yes AWV questionnaire completed by patient prior to visit?: no Living arrangements:: (!) lives alone Patient's Overall Health Status Rating: excellent Typical amount of pain: some Does pain affect daily life?: no Are you currently prescribed opioids?: no  Dietary Habits and Nutritional Risks How many meals a day?: (!) 1 (1 meal a day and snacks through out the days) Eats fruit and vegetables daily?: yes Most meals are obtained by: preparing own meals In the last 2 weeks, have you had any of the following?: (!) nausea, vomiting, diarrhea (nausea) Diabetic:: (!) yes Any non-healing wounds?: no How often do you check your BS?: continuous glucose monitor Would you like to be referred to a Nutritionist or for Diabetic Management? : no  Functional Status Activities of Daily Living (to include ambulation/medication): Independent Ambulation: Independent with device- listed below Home Assistive Devices/Equipment: Eyeglasses Medication Administration:  Independent Home Management (perform basic housework or laundry): Independent Manage your own finances?: yes Primary transportation is: driving Concerns about vision?: no *vision screening is required for WTM* Concerns about hearing?: no  Fall Screening Falls in the past year?: 0 Number of falls in past year: 0 Was there an injury with Fall?: 0 Fall Risk Category Calculator: 0 Patient Fall Risk Level: Low Fall Risk  Fall Risk Patient at Risk for Falls Due to: No Fall Risks Fall risk Follow up: Falls evaluation completed  Home and Transportation Safety: All rugs have non-skid backing?: yes All stairs or steps have railings?: (!) no (1 step without handrail) Grab bars in the bathtub or shower?: yes Have non-skid surface in bathtub or shower?: (!) no Good home lighting?: yes Regular seat belt use?: yes Hospital stays in the last year:: no  Cognitive Assessment Difficulty concentrating, remembering, or making decisions? : no Will 6CIT or Mini Cog be Completed: yes What year is it?: 0 points What month is it?: 0 points Give patient an address phrase to remember (5 components): [address] [place] About what time is it?: 0 points (looked at time on TV) Count backwards from 20 to 1: 0 points Say the months of the year in reverse: 2 points (Missed April) Repeat the address phrase from earlier: 4 points 6 CIT Score: 6 points  Advance Directives (For Healthcare) Does Patient Have a Medical Advance Directive?: No Would patient like information on creating a medical advance directive?: No - Patient declined  Reviewed/Updated  Reviewed/Updated: Reviewed All (Medical, Surgical, Family, Medications, Allergies, Care Teams, Patient Goals)    Allergies (verified) Niacin and related and Cyclobenzaprine    Current Medications (verified) Outpatient Encounter Medications as of 04/04/2024  Medication Sig   amLODipine  (NORVASC ) 5 MG tablet Take 1 tablet (5 mg total) by  mouth daily.    aspirin 81 MG tablet Take 81 mg by mouth daily. am   atorvastatin  (LIPITOR) 40 MG tablet TAKE 1 TABLET BY MOUTH EVERY DAY   Continuous Glucose Sensor (FREESTYLE LIBRE 3 PLUS SENSOR) MISC Change sensor every 15 days.   empagliflozin  (JARDIANCE ) 25 MG TABS tablet Take 1 tablet (25 mg total) by mouth daily.   gabapentin  (NEURONTIN ) 300 MG capsule Take 2 capsules (600 mg total) by mouth at bedtime.   meloxicam  (MOBIC ) 7.5 MG tablet TAKE 1 TABLET BY MOUTH EVERY DAY (Patient taking differently: Take 7.5 mg by mouth daily. PRN)   metFORMIN  (GLUCOPHAGE ) 500 MG tablet Take 2 tablets (1,000 mg total) by mouth 2 (two) times daily with a meal.   metoprolol  succinate (TOPROL -XL) 100 MG 24 hr tablet TAKE 1 TABLET BY MOUTH EVERY DAY WITH OR IMMEDIATELY FOLLOWING A MEAL   Multiple Vitamins-Minerals (MULTIVITAMIN ADULT) CHEW Chew by mouth daily.   nystatin  (MYCOSTATIN /NYSTOP ) powder Apply 1 Application topically 3 (three) times daily. (Patient taking differently: Apply 1 Application topically 3 (three) times daily. PRN)   omeprazole  (PRILOSEC) 20 MG capsule Take 1 capsule (20 mg total) by mouth daily.   ondansetron  (ZOFRAN ) 4 MG tablet TAKE 1 TABLET BY MOUTH EVERY 8 HOURS AS NEEDED FOR NAUSEA AND VOMITING   Semaglutide , 1 MG/DOSE, (OZEMPIC , 1 MG/DOSE,) 4 MG/3ML SOPN Inject 1 mg into the skin once a week. (Patient taking differently: Inject 1 mg into the skin every 14 (fourteen) days.)   telmisartan  (MICARDIS ) 80 MG tablet Take 1 tablet (80 mg total) by mouth daily.   traZODone  (DESYREL ) 50 MG tablet Take 0.5-1 tablets (25-50 mg total) by mouth at bedtime as needed. for sleep   Blood Glucose Monitoring Suppl (EMBRACE PRO GLUCOSE METER) DEVI USE TO CHECK BLOOD GLUCOSE DAILY (Patient not taking: Reported on 04/04/2024)   glucose blood test strip Use as instructed (Patient not taking: Reported on 04/04/2024)   Lancets (ONETOUCH ULTRASOFT) lancets Use as instructed (Patient not taking: Reported on 04/04/2024)   No  facility-administered encounter medications on file as of 04/04/2024.    History: Past Medical History:  Diagnosis Date   Acute bronchitis    Allergy    Arthritis    right ankle   Complication of anesthesia    difficult to wake up   Diabetes mellitus without complication (HCC)    type 2   Diarrhea    Esophagitis    Excessive menstruation    GERD (gastroesophageal reflux disease)    Hyperlipidemia    Hypertension    CONTROLLED ON MEDS   Long term current use of insulin  (HCC)    Neuropathy    left leg and feet   Shortness of breath dyspnea    upon exertion   Wears partial dentures    upper / does not wear   Past Surgical History:  Procedure Laterality Date   CHOLECYSTECTOMY     COLONOSCOPY WITH PROPOFOL  N/A 01/12/2015   Procedure: COLONOSCOPY WITH PROPOFOL ;  Surgeon: Rogelia Copping, MD;  Location: [facility];  Service: Endoscopy;  Laterality: N/A;  DIABETIC-INSULIN  DEPENDENT   COLONOSCOPY WITH PROPOFOL  N/A 04/13/2020   Procedure: COLONOSCOPY WITH BIOPSY;  Surgeon: Copping Rogelia, MD;  Location: [facility];  Service: Endoscopy;  Laterality: N/A;   OPEN REDUCTION INTERNAL FIXATION (ORIF) DISTAL RADIAL FRACTURE Right 01/17/2019   Procedure: OPEN REDUCTION INTERNAL FIXATION (ORIF) DISTAL RADIAL FRACTURE;  Surgeon: Kathlynn Sharper, MD;  Location: [facility];  Service: Orthopedics;  Laterality: Right;  POLYPECTOMY  01/12/2015   Procedure: POLYPECTOMY;  Surgeon: Rogelia Copping, MD;  Location: [facility];  Service: Endoscopy;;   POLYPECTOMY N/A 04/13/2020   Procedure: POLYPECTOMY;  Surgeon: Copping Rogelia, MD;  Location: [facility];  Service: Endoscopy;  Laterality: N/A;   WRIST SURGERY Right    ganglion cyst   Family History  Problem Relation Age of Onset   Diabetes Sister    Diabetes Brother    Stroke Paternal Grandmother    Diabetes Sister    Diabetes Sister    Social History   Occupational History   Occupation: retired  Tobacco Use   Smoking status:  Former    Current packs/day: 0.00    Average packs/day: 0.3 packs/day for 26.0 years (6.5 ttl pk-yrs)    Types: Cigarettes    Start date: 52    Quit date: 2004    Years since quitting: 22.0   Smokeless tobacco: Never  Vaping Use   Vaping status: Never Used  Substance and Sexual Activity   Alcohol use: No    Alcohol/week: 0.0 standard drinks of alcohol   Drug use: No   Sexual activity: Never   Tobacco Counseling Counseling given: Not Answered  SDOH Screenings   Food Insecurity: No Food Insecurity (04/04/2024)  Housing: Low Risk (04/04/2024)  Transportation Needs: No Transportation Needs (04/04/2024)  Utilities: Not At Risk (04/04/2024)  Alcohol Screen: Low Risk (07/06/2022)  Depression (PHQ2-9): Low Risk (04/04/2024)  Financial Resource Strain: Medium Risk (10/15/2023)  Physical Activity: Insufficiently Active (04/04/2024)  Social Connections: Socially Isolated (04/04/2024)  Stress: No Stress Concern Present (04/04/2024)  Tobacco Use: Medium Risk (04/04/2024)  Health Literacy: Adequate Health Literacy (04/04/2024)   See flowsheets for full screening details  Depression Screen PHQ 2 & 9 Depression Scale- Over the past 2 weeks, how often have you been bothered by any of the following problems? Little interest or pleasure in doing things: 0 Feeling down, depressed, or hopeless (PHQ Adolescent also includes...irritable): 0 PHQ-2 Total Score: 0 Trouble falling or staying asleep, or sleeping too much: 1 Feeling tired or having little energy: 0 Poor appetite or overeating (PHQ Adolescent also includes...weight loss): 0 Feeling bad about yourself - or that you are a failure or have let yourself or your family down: 0 Trouble concentrating on things, such as reading the newspaper or watching television (PHQ Adolescent also includes...like school work): 0 Moving or speaking so slowly that other people could have noticed. Or the opposite - being so fidgety or restless that you have been moving  around a lot more than usual: 0 Thoughts that you would be better off dead, or of hurting yourself in some way: 0 PHQ-9 Total Score: 1 If you checked off any problems, how difficult have these problems made it for you to do your work, take care of things at home, or get along with other people?: Not difficult at all  Depression Treatment Depression Interventions/Treatment : EYV7-0 Score <4 Follow-up Not Indicated     Goals Addressed               This Visit's Progress     Maintain weight loss (pt-stated)               Objective:    Today's Vitals   04/04/24 0804  Weight: 120 lb (54.4 kg)  Height: 5' 4.5 (1.638 m)   Body mass index is 20.28 kg/m.  Hearing/Vision screen Hearing Screening - Comments:: Denies hearing loss  Vision Screening - Comments:: UTD w/  Dr. Feliciano Ober Immunizations and Health Maintenance Health Maintenance  Topic Date Due   Mammogram  12/02/2022   COVID-19 Vaccine (3 - 2025-26 season) 11/06/2023   Diabetic kidney evaluation - Urine ACR  04/12/2024   Zoster Vaccines- Shingrix  (1 of 2) 05/04/2024 (Originally 09/29/2006)   OPHTHALMOLOGY EXAM  06/04/2024   FOOT EXAM  07/11/2024   HEMOGLOBIN A1C  08/06/2024   Diabetic kidney evaluation - eGFR measurement  02/05/2025   Medicare Annual Wellness (AWV)  04/04/2025   Colonoscopy  04/13/2025   Bone Density Scan  06/27/2027   DTaP/Tdap/Td (4 - Td or Tdap) 07/24/2029   Pneumococcal Vaccine: 50+ Years  Completed   Influenza Vaccine  Completed   Hepatitis C Screening  Completed   Meningococcal B Vaccine  Aged Out        Assessment/Plan:  This is a routine wellness examination for April Johnson.  Patient Care Team: Valerio Melanie DASEN, NP as PCP - General (Nurse Practitioner) Deanna [name] LABOR, [facility] (Pharmacist) Ober Feliciano Hugger, MD as Consulting Physician (Ophthalmology)  I have personally reviewed and noted the following in the patients chart:   Medical and social history Use of alcohol,  tobacco or illicit drugs  Current medications and supplements including opioid prescriptions. Functional ability and status Nutritional status Physical activity Advanced directives List of other physicians Hospitalizations, surgeries, and ER visits in previous 12 months Vitals Screenings to include cognitive, depression, and falls Referrals and appointments  No orders of the defined types were placed in this encounter.  In addition, I have reviewed and discussed with patient certain preventive protocols, quality metrics, and best practice recommendations. A written personalized care plan for preventive services as well as general preventive health recommendations were provided to patient.   April Johnson, CMA   04/04/2024   Return in 1 year (on 04/08/2025) for Medicare Annual Wellness Visit.  After Visit Summary: (MyChart) Due to this being a telephonic visit, the after visit summary with patients personalized plan was offered to patient via MyChart   Nurse Notes:  6 CIT Score - 6 MMG scheduled for 04/04/24 Needs shingles vaccines (pharmacy) Declined Covid vaccine Declined DM & Nutrition Education referral "

## 2024-04-08 ENCOUNTER — Other Ambulatory Visit (HOSPITAL_COMMUNITY): Payer: Self-pay

## 2024-04-08 ENCOUNTER — Telehealth: Payer: Self-pay

## 2024-04-09 ENCOUNTER — Telehealth: Payer: Self-pay

## 2024-04-09 NOTE — Progress Notes (Signed)
" ° °  04/09/2024  [name], female   DOB: Nov 01, 1955, 69 y.o.   MRN: [id number]  Clinic routed request from patient's PCP office stating patient is reporting [employer] needs new prescription for [facility].  I have coordinated with medication assistance team to follow-up with [employer] to verify this is all that is needed to complete 2026 PAP re-enrollment.  If so, I will coordinate with PCP to send new prescription.  Provider also informed me that [place] 3+ sensors were denied by insurance.  This is likely because she is no longer using daily insulin  to manage diabetes.  I will follow-up with patient to make sure she is aware of insurance denial for [place] sensors and make sure she has other testing supplies at home.  I will also let her know about [employer] PAP status.  [name], PharmD, DPLA  "

## 2024-04-09 NOTE — Telephone Encounter (Signed)
 Received a letter from [employer] needing pt proof of income ,faxed pt proof of income to company today.

## 2024-04-09 NOTE — Telephone Encounter (Signed)
 Pharmacy Patient Advocate Encounter  Received notification from [facility] MEDICARE that Prior Authorization for FreeStyle Libre 3 Plus Sensor  has been DENIED.  Full denial letter will be uploaded to the media tab. See denial reason below.   PA #/Case ID/Reference #: [id number]

## 2024-04-09 NOTE — Telephone Encounter (Signed)
 Copied from CRM #[id number]. Topic: General - Other >> Apr 08, 2024  1:36 PM Rosaria E wrote: Reason for CRM: Pt called to report that [facility] is still missing the order for empagliflozin  (JARDIANCE ) 25 MG TABS tablet from [facility]. Please advise.

## 2024-04-09 NOTE — Progress Notes (Signed)
 Contacted via MyChart   Normal mammogram, may repeat in one year:)

## 2024-04-11 ENCOUNTER — Telehealth: Payer: Self-pay

## 2024-04-11 DIAGNOSIS — E1129 Type 2 diabetes mellitus with other diabetic kidney complication: Secondary | ICD-10-CM

## 2024-04-11 NOTE — Progress Notes (Signed)
" ° °  04/11/2024  [name], female   DOB: 11-Aug-1955, 69 y.o.   MRN: [id number]  Patient outreach to inform patient that Jardiance  PAP has been approved through 03/06/25, and the program is currently working on filling the medication- this should arrive in the next 7-10 days.  Patient has enough medication to last until then. Tracking [phone]  Insurance will no longer cover Libre 3+ sensors since she is no longer using insulin .  Patient cannot afford to pay $75 out of pocket monthly on these, and will resume monitor BG with fingersticks- endorses having glucometer, strips, and lancets on hand.  She is down to her last box of Ozempic  1mg , which will last 8 weeks since she uses 1 injection every other week.  Test claim reflects a 1 month supply would be >$600 to fill, which is not affordable.  I recommend changing to Trulicity 0.75mg  weekly- will consult PCP; and if in agreement, will coordinate with medication assistance team to initiate application.  [name], PharmD, DPLA  "

## 2024-05-15 ENCOUNTER — Ambulatory Visit: Admitting: Nurse Practitioner

## 2024-05-20 ENCOUNTER — Ambulatory Visit: Admitting: Nurse Practitioner

## 2024-06-26 ENCOUNTER — Other Ambulatory Visit

## 2025-04-08 ENCOUNTER — Ambulatory Visit
